# Patient Record
Sex: Female | Born: 1965 | Race: White | Hispanic: No | Marital: Single | Smoking: Current every day smoker
Health system: Southern US, Community
[De-identification: ages and names within clinical notes are randomized; demographics above are authoritative.]

## PROBLEM LIST (undated history)

## (undated) DIAGNOSIS — Z87898 Personal history of other specified conditions: Secondary | ICD-10-CM

## (undated) DIAGNOSIS — K759 Inflammatory liver disease, unspecified: Secondary | ICD-10-CM

## (undated) DIAGNOSIS — G43909 Migraine, unspecified, not intractable, without status migrainosus: Secondary | ICD-10-CM

## (undated) DIAGNOSIS — F418 Other specified anxiety disorders: Secondary | ICD-10-CM

## (undated) DIAGNOSIS — Z8719 Personal history of other diseases of the digestive system: Secondary | ICD-10-CM

## (undated) DIAGNOSIS — F319 Bipolar disorder, unspecified: Secondary | ICD-10-CM

## (undated) DIAGNOSIS — K5909 Other constipation: Secondary | ICD-10-CM

## (undated) DIAGNOSIS — F329 Major depressive disorder, single episode, unspecified: Secondary | ICD-10-CM

## (undated) DIAGNOSIS — F1491 Cocaine use, unspecified, in remission: Secondary | ICD-10-CM

## (undated) DIAGNOSIS — E559 Vitamin D deficiency, unspecified: Secondary | ICD-10-CM

## (undated) DIAGNOSIS — F419 Anxiety disorder, unspecified: Secondary | ICD-10-CM

## (undated) DIAGNOSIS — M1990 Unspecified osteoarthritis, unspecified site: Secondary | ICD-10-CM

## (undated) DIAGNOSIS — F32A Depression, unspecified: Secondary | ICD-10-CM

## (undated) DIAGNOSIS — E039 Hypothyroidism, unspecified: Secondary | ICD-10-CM

## (undated) DIAGNOSIS — Z8701 Personal history of pneumonia (recurrent): Secondary | ICD-10-CM

## (undated) DIAGNOSIS — F17200 Nicotine dependence, unspecified, uncomplicated: Secondary | ICD-10-CM

## (undated) DIAGNOSIS — F5104 Psychophysiologic insomnia: Secondary | ICD-10-CM

## (undated) DIAGNOSIS — K219 Gastro-esophageal reflux disease without esophagitis: Secondary | ICD-10-CM

## (undated) DIAGNOSIS — I10 Essential (primary) hypertension: Secondary | ICD-10-CM

## (undated) DIAGNOSIS — J45909 Unspecified asthma, uncomplicated: Secondary | ICD-10-CM

## (undated) DIAGNOSIS — J449 Chronic obstructive pulmonary disease, unspecified: Secondary | ICD-10-CM

## (undated) DIAGNOSIS — E042 Nontoxic multinodular goiter: Secondary | ICD-10-CM

## (undated) DIAGNOSIS — J309 Allergic rhinitis, unspecified: Secondary | ICD-10-CM

## (undated) DIAGNOSIS — Z8619 Personal history of other infectious and parasitic diseases: Secondary | ICD-10-CM

## (undated) HISTORY — DX: Personal history of other infectious and parasitic diseases: Z86.19

## (undated) HISTORY — DX: Allergic rhinitis, unspecified: J30.9

## (undated) HISTORY — DX: Hypothyroidism, unspecified: E03.9

## (undated) HISTORY — DX: Personal history of other specified conditions: Z87.898

## (undated) HISTORY — DX: Cocaine use, unspecified, in remission: F14.91

## (undated) HISTORY — DX: Chronic obstructive pulmonary disease, unspecified: J44.9

## (undated) HISTORY — DX: Nicotine dependence, unspecified, uncomplicated: F17.200

## (undated) HISTORY — DX: Vitamin D deficiency, unspecified: E55.9

## (undated) HISTORY — DX: Other specified anxiety disorders: F41.8

## (undated) HISTORY — PX: NECK SURGERY: SHX720

## (undated) HISTORY — DX: Migraine, unspecified, not intractable, without status migrainosus: G43.909

## (undated) HISTORY — DX: Nontoxic multinodular goiter: E04.2

## (undated) HISTORY — DX: Other constipation: K59.09

## (undated) HISTORY — DX: Personal history of pneumonia (recurrent): Z87.01

## (undated) HISTORY — DX: Psychophysiologic insomnia: F51.04

---
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
## 2001-07-07 HISTORY — PX: ECTOPIC PREGNANCY SURGERY: SHX613

## 2003-05-10 ENCOUNTER — Other Ambulatory Visit: Payer: Self-pay

## 2004-10-21 ENCOUNTER — Emergency Department: Payer: Self-pay | Admitting: Emergency Medicine

## 2004-10-21 ENCOUNTER — Other Ambulatory Visit: Payer: Self-pay

## 2005-02-13 ENCOUNTER — Emergency Department: Payer: Self-pay | Admitting: Emergency Medicine

## 2005-06-06 ENCOUNTER — Emergency Department: Payer: Self-pay | Admitting: Internal Medicine

## 2005-10-14 ENCOUNTER — Ambulatory Visit: Payer: Self-pay

## 2006-02-13 ENCOUNTER — Emergency Department: Payer: Self-pay | Admitting: Emergency Medicine

## 2006-03-02 ENCOUNTER — Emergency Department: Payer: Self-pay | Admitting: Unknown Physician Specialty

## 2006-04-12 ENCOUNTER — Emergency Department: Payer: Self-pay | Admitting: Emergency Medicine

## 2009-04-14 ENCOUNTER — Emergency Department: Payer: Self-pay | Admitting: Emergency Medicine

## 2014-02-18 ENCOUNTER — Emergency Department: Payer: Self-pay | Admitting: Emergency Medicine

## 2014-02-19 ENCOUNTER — Emergency Department: Payer: Self-pay | Admitting: Emergency Medicine

## 2014-02-20 LAB — BASIC METABOLIC PANEL
Anion Gap: 8 (ref 7–16)
BUN: 8 mg/dL (ref 7–18)
CO2: 30 mmol/L (ref 21–32)
CREATININE: 0.99 mg/dL (ref 0.60–1.30)
Calcium, Total: 8.7 mg/dL (ref 8.5–10.1)
Chloride: 103 mmol/L (ref 98–107)
EGFR (Non-African Amer.): >60
Glucose: 82 mg/dL (ref 65–99)
Osmolality: 279 (ref 275–301)
POTASSIUM: 4 mmol/L (ref 3.5–5.1)
Sodium: 141 mmol/L (ref 136–145)

## 2014-02-20 LAB — CBC
HCT: 34.6 % — ABNORMAL LOW (ref 35.0–47.0)
HGB: 11.8 g/dL — ABNORMAL LOW (ref 12.0–16.0)
MCH: 31.1 pg (ref 26.0–34.0)
MCHC: 34 g/dL (ref 32.0–36.0)
MCV: 91 fL (ref 80–100)
Platelet: 246 10*3/uL (ref 150–440)
RBC: 3.79 10*6/uL — AB (ref 3.80–5.20)
RDW: 14.3 % (ref 11.5–14.5)
WBC: 8.2 10*3/uL (ref 3.6–11.0)

## 2014-03-06 DIAGNOSIS — G479 Sleep disorder, unspecified: Secondary | ICD-10-CM | POA: Insufficient documentation

## 2014-03-06 DIAGNOSIS — R2689 Other abnormalities of gait and mobility: Secondary | ICD-10-CM | POA: Insufficient documentation

## 2014-03-06 DIAGNOSIS — R519 Headache, unspecified: Secondary | ICD-10-CM | POA: Insufficient documentation

## 2014-03-06 DIAGNOSIS — R51 Headache: Secondary | ICD-10-CM

## 2014-03-06 DIAGNOSIS — G4701 Insomnia due to medical condition: Secondary | ICD-10-CM | POA: Insufficient documentation

## 2014-09-27 ENCOUNTER — Emergency Department: Payer: Self-pay | Admitting: Student

## 2014-09-27 LAB — CBC
HCT: 36.9 %
HGB: 12.1 g/dL
MCH: 30.3 pg
MCHC: 32.7 g/dL
MCV: 93 fL
Platelet: 300 x10 3/mm 3
RBC: 3.99 X10 6/mm 3
RDW: 13.8 %
WBC: 10.3 x10 3/mm 3

## 2014-10-05 ENCOUNTER — Ambulatory Visit
Admit: 2014-10-05 | Disposition: A | Discharge status: Home / Self Care | Payer: Self-pay | Attending: Gastroenterology | Admitting: Gastroenterology

## 2014-12-14 ENCOUNTER — Telehealth: Payer: Self-pay | Admitting: Gastroenterology

## 2014-12-14 NOTE — Telephone Encounter (Signed)
Pt is still having reactions with her Hep C

## 2014-12-14 NOTE — Telephone Encounter (Signed)
Pt still having reactions to the hep C medication and vomiting Please call pt

## 2014-12-21 NOTE — Telephone Encounter (Signed)
Tried contacting pt on 2 numbers that are provided. One has been disconnected. The other I LVM for her to return my call.

## 2014-12-25 NOTE — Telephone Encounter (Signed)
Pt returned my call. Stated she is no longer feeling nauseated. Advised her this was most likely a side effect. Asked her to call back if she continues to feel sick.

## 2014-12-28 ENCOUNTER — Ambulatory Visit (INDEPENDENT_AMBULATORY_CARE_PROVIDER_SITE_OTHER): Payer: Medicare Other | Admitting: Family Medicine

## 2014-12-28 ENCOUNTER — Encounter: Payer: Self-pay | Admitting: Family Medicine

## 2014-12-28 VITALS — BP 108/72 | HR 96 | Temp 98.5°F | Resp 18 | Ht 69.0 in | Wt 173.3 lb

## 2014-12-28 DIAGNOSIS — J0100 Acute maxillary sinusitis, unspecified: Secondary | ICD-10-CM

## 2014-12-28 DIAGNOSIS — K219 Gastro-esophageal reflux disease without esophagitis: Secondary | ICD-10-CM

## 2014-12-28 DIAGNOSIS — J9801 Acute bronchospasm: Secondary | ICD-10-CM

## 2014-12-28 MED ORDER — BENZONATATE 200 MG PO CAPS: 200.0000 mg | ORAL_CAPSULE | Freq: Three times a day (TID) | ORAL | Status: DC | PRN

## 2014-12-28 MED ORDER — AMOXICILLIN-POT CLAVULANATE 875-125 MG PO TABS: 1.0000 | ORAL_TABLET | Freq: Two times a day (BID) | ORAL | Status: DC

## 2014-12-28 MED ORDER — OMEPRAZOLE 20 MG PO CPDR: 20.0000 mg | DELAYED_RELEASE_CAPSULE | Freq: Every day | ORAL | Status: DC

## 2014-12-28 MED ORDER — HYDROCOD POLST-CPM POLST ER 10-8 MG/5ML PO SUER: 5.0000 mL | Freq: Two times a day (BID) | ORAL | Status: DC | PRN

## 2014-12-28 NOTE — Progress Notes (Signed)
Name: Sabrina Espinoza   MRN: [id number]    DOB: 11-Jul-1965   Date:12/28/2014       Progress Note  Subjective  Chief Complaint  Chief Complaint  Patient presents with  . URI    patient stated she has been bother for over a week  . Heartburn    patient states that she has been using a lot of Tums    HPI  URI: Patient is here today with concerns regarding the following symptoms sore throat, congestion, sneezing, sinus pressure, productive cough and achiness that started almost 2 weeks ago.  Associated with fatigue and malaise. Has tried the following home remedies: Flonase nasal spray, Equate nasal saline spray, otc night-time medication.   GERD: Paitent complains of heartburn. This has been associated with cough, heartburn and need to clear throat frequently.  She denies belching, chest pain, dysphagia, fullness after meals, midespigastric pain and wheezing. Symptoms have been present for several weeks. She denies dysphagia.  She has not lost weight. She denies melena, hematochezia, hematemesis, and coffee ground emesis. Medical therapy in the past has included antacids.     Past Medical History  Diagnosis Date  . Chronic hepatitis C with hepatic coma     Followed by Dr. Servando Snare. Starting [facility] treatment for 12 weeks in 2016  . Hypothyroidism, adult     Working with Dr. Horton Chin, Endocrinology. S/P RAIU and is planning on biopsy  . Nontoxic multinodular goiter     FNA done 10/30/14 with benign results. Seen [name]. Patient may want a second opinion.  . Vitamin D deficiency   . Nicotine dependence, uncomplicated   . Depression with anxiety     Followed by Dr. Lynett Fish, psychiatrist at [facility] and [facility] Flavin for thrapy  . Migraine without status migrainosus, not intractable   . Rhinitis, allergic   . COPD, moderate   . Chronic constipation     on Linzess, Miralax, Zofran  . History of chicken pox   . History of cocaine use     History  Substance  Use Topics  . Smoking status: Current Every Day Smoker    Types: Cigarettes  . Smokeless tobacco: Not on file  . Alcohol Use: No     Current outpatient prescriptions:  .  budesonide-formoterol (SYMBICORT) 160-4.5 MCG/ACT inhaler, , Disp: , Rfl:  .  butalbital-acetaminophen-caffeine (FIORICET, ESGIC) 50-325-40 MG per tablet, , Disp: , Rfl:  .  Calcium-Vitamin D 600-200 MG-UNIT per tablet, Take by mouth., Disp: , Rfl:  .  clonazePAM (KLONOPIN) 1 MG tablet, Take by mouth., Disp: , Rfl:  .  gabapentin (NEURONTIN) 300 MG capsule, Take by mouth., Disp: , Rfl:   No Known Allergies  ROS  10 Systems reviewed and is negative except as mentioned in HPI.   Objective  Filed Vitals:   12/28/14 1449  BP: 108/72  Pulse: 96  Temp: 98.5 F (36.9 C)  TempSrc: Oral  Resp: 18  Height:  (1.753 m)  Weight: 173 lb 4.8 oz (78.608 kg)  SpO2: 97%     Physical Exam  Constitutional: Patient appears well-developed and well-nourished. In no acute distress but does appear to be fatigued from acute illness. HEENT:  - Head: Normocephalic and atraumatic.  - Ears: RIGHT TM bulging with minimal clear exudate, LEFT TM bulging with minimal clear exudate.  - Nose: Nasal mucosa boggy and congested. Maxillary sinus tenderness bilaterally.  - Mouth/Throat: Oropharynx is moist with slight erythema of bilateral tonsils without hypertrophy or  exudates. Post nasal drainage present.  - Eyes: Conjunctivae clear, EOM movements normal. PERRLA. No scleral icterus.  Neck: Normal range of motion. Neck supple. No JVD present. No thyromegaly present. No local lymphadenopathy. Cardiovascular: Regular rate, regular rhythm with no murmurs heard.  Pulmonary/Chest: Effort normal and breath sounds clear in all lung fields.  Abdomen: Soft, NT/ND, normal bowel sounds. Musculoskeletal: Normal range of motion bilateral UE and LE, no joint effusions. Skin: Skin is warm and dry. No rash noted. Psychiatric: Patient has a normal  mood and affect. Behavior is normal in office today. Judgment and thought content normal in office today.   Assessment & Plan 1. Acute maxillary sinusitis, recurrence not specified Etiologies include allergic rhinitis, viral or bacterial infection. Instructed patient on increasing hydration, nasal saline spray, steam inhalation, NSAID if tolerated and not contraindicated. If not already doing so start taking daily anti-histamine and use a steroid nasal spray. If symptoms persist/worsen may consider antibiotic therapy.  - amoxicillin-clavulanate (AUGMENTIN) 875-125 MG per tablet; Take 1 tablet by mouth 2 (two) times daily.  Dispense: 20 tablet; Refill: 0  2. Cough due to bronchospasm May also try OTC Delsym if RX medication not covered by insurance.   - benzonatate (TESSALON) 200 MG capsule; Take 1 capsule (200 mg total) by mouth 3 (three) times daily as needed for cough.  Dispense: 30 capsule; Refill: 0 - chlorpheniramine-HYDROcodone (TUSSIONEX PENNKINETIC ER) 10-8 MG/5ML SUER; Take 5 mLs by mouth every 12 (twelve) hours as needed for cough.  Dispense: 100 mL; Refill: 0  3. Gastroesophageal reflux disease without esophagitis Avoid excessive use of PPI as recent literature shows long term use can contribute to adverse outcomes.  - omeprazole (PRILOSEC) 20 MG capsule; Take 1 capsule (20 mg total) by mouth daily. For 14 days then wean off.  Dispense: 30 capsule; Refill: 2

## 2014-12-28 NOTE — Patient Instructions (Signed)
Upper Respiratory Infection, Adult An upper respiratory infection (URI) is also sometimes known as the common cold. The upper respiratory tract includes the nose, sinuses, throat, trachea, and bronchi. Bronchi are the airways leading to the lungs. Most people improve within 1 week, but symptoms can last up to 2 weeks. A residual cough may last even longer.  CAUSES Many different viruses can infect the tissues lining the upper respiratory tract. The tissues become irritated and inflamed and often become very moist. Mucus production is also common. A cold is contagious. You can easily spread the virus to others by oral contact. This includes kissing, sharing a glass, coughing, or sneezing. Touching your mouth or nose and then touching a surface, which is then touched by another person, can also spread the virus. SYMPTOMS  Symptoms typically develop 1 to 3 days after you come in contact with a cold virus. Symptoms vary from person to person. They may include:  Runny nose.  Sneezing.  Nasal congestion.  Sinus irritation.  Sore throat.  Loss of voice (laryngitis).  Cough.  Fatigue.  Muscle aches.  Loss of appetite.  Headache.  Low-grade fever. DIAGNOSIS  You might diagnose your own cold based on familiar symptoms, since most people get a cold 2 to 3 times a year. Your caregiver can confirm this based on your exam. Most importantly, your caregiver can check that your symptoms are not due to another disease such as strep throat, sinusitis, pneumonia, asthma, or epiglottitis. Blood tests, throat tests, and X-rays are not necessary to diagnose a common cold, but they may sometimes be helpful in excluding other more serious diseases. Your caregiver will decide if any further tests are required. RISKS AND COMPLICATIONS  You may be at risk for a more severe case of the common cold if you smoke cigarettes, have chronic heart disease (such as heart failure) or lung disease (such as asthma), or if  you have a weakened immune system. The very young and very old are also at risk for more serious infections. Bacterial sinusitis, middle ear infections, and bacterial pneumonia can complicate the common cold. The common cold can worsen asthma and chronic obstructive pulmonary disease (COPD). Sometimes, these complications can require emergency medical care and may be life-threatening. PREVENTION  The best way to protect against getting a cold is to practice good hygiene. Avoid oral or hand contact with people with cold symptoms. Wash your hands often if contact occurs. There is no clear evidence that vitamin C, vitamin E, echinacea, or exercise reduces the chance of developing a cold. However, it is always recommended to get plenty of rest and practice good nutrition. TREATMENT  Treatment is directed at relieving symptoms. There is no cure. Antibiotics are not effective, because the infection is caused by a virus, not by bacteria. Treatment may include:  Increased fluid intake. Sports drinks offer valuable electrolytes, sugars, and fluids.  Breathing heated mist or steam (vaporizer or shower).  Eating chicken soup or other clear broths, and maintaining good nutrition.  Getting plenty of rest.  Using gargles or lozenges for comfort.  Controlling fevers with ibuprofen or acetaminophen as directed by your caregiver.  Increasing usage of your inhaler if you have asthma. Zinc gel and zinc lozenges, taken in the first 24 hours of the common cold, can shorten the duration and lessen the severity of symptoms. Pain medicines may help with fever, muscle aches, and throat pain. A variety of non-prescription medicines are available to treat congestion and runny nose. Your caregiver   can make recommendations and may suggest nasal or lung inhalers for other symptoms.  HOME CARE INSTRUCTIONS   Only take over-the-counter or prescription medicines for pain, discomfort, or fever as directed by your  caregiver.  Use a warm mist humidifier or inhale steam from a shower to increase air moisture. This may keep secretions moist and make it easier to breathe.  Drink enough water and fluids to keep your urine clear or pale yellow.  Rest as needed.  Return to work when your temperature has returned to normal or as your caregiver advises. You may need to stay home longer to avoid infecting others. You can also use a face mask and careful hand washing to prevent spread of the virus. SEEK MEDICAL CARE IF:   After the first few days, you feel you are getting worse rather than better.  You need your caregiver's advice about medicines to control symptoms.  You develop chills, worsening shortness of breath, or brown or red sputum. These may be signs of pneumonia.  You develop yellow or brown nasal discharge or pain in the face, especially when you bend forward. These may be signs of sinusitis.  You develop a fever, swollen neck glands, pain with swallowing, or white areas in the back of your throat. These may be signs of strep throat. SEEK IMMEDIATE MEDICAL CARE IF:   You have a fever.  You develop severe or persistent headache, ear pain, sinus pain, or chest pain.  You develop wheezing, a prolonged cough, cough up blood, or have a change in your usual mucus (if you have chronic lung disease).  You develop sore muscles or a stiff neck. Document Released: 12/17/2000 Document Revised: 09/15/2011 Document Reviewed: 09/28/2013 Exit[employer] Patient Information 2015 [employer]. This information is not intended to replace advice given to you by your health care provider. Make sure you discuss any questions you have with your health care provider.  

## 2015-01-10 ENCOUNTER — Telehealth: Payer: Self-pay | Admitting: Family Medicine

## 2015-01-10 NOTE — Telephone Encounter (Signed)
Patient must come in for an appt.

## 2015-01-10 NOTE — Telephone Encounter (Signed)
Pt was prescribed some antibotics and have completed them. She is no better and is [facility] [phone]

## 2015-01-10 NOTE — Telephone Encounter (Signed)
Patient scheduled appointment for Tuesday July 12

## 2015-01-16 ENCOUNTER — Ambulatory Visit (INDEPENDENT_AMBULATORY_CARE_PROVIDER_SITE_OTHER): Payer: Medicare Other | Admitting: Family Medicine

## 2015-01-16 ENCOUNTER — Encounter: Payer: Self-pay | Admitting: Family Medicine

## 2015-01-16 ENCOUNTER — Other Ambulatory Visit: Payer: Self-pay | Admitting: Family Medicine

## 2015-01-16 VITALS — BP 116/78 | HR 94 | Temp 98.0°F | Resp 18 | Ht 69.0 in | Wt 172.8 lb

## 2015-01-16 DIAGNOSIS — J449 Chronic obstructive pulmonary disease, unspecified: Secondary | ICD-10-CM

## 2015-01-16 DIAGNOSIS — B182 Chronic viral hepatitis C: Secondary | ICD-10-CM

## 2015-01-16 DIAGNOSIS — F418 Other specified anxiety disorders: Secondary | ICD-10-CM

## 2015-01-16 DIAGNOSIS — J40 Bronchitis, not specified as acute or chronic: Secondary | ICD-10-CM | POA: Diagnosis not present

## 2015-01-16 DIAGNOSIS — F3177 Bipolar disorder, in partial remission, most recent episode mixed: Secondary | ICD-10-CM

## 2015-01-16 DIAGNOSIS — K5909 Other constipation: Secondary | ICD-10-CM

## 2015-01-16 DIAGNOSIS — Z87898 Personal history of other specified conditions: Secondary | ICD-10-CM | POA: Insufficient documentation

## 2015-01-16 DIAGNOSIS — F1491 Cocaine use, unspecified, in remission: Secondary | ICD-10-CM | POA: Insufficient documentation

## 2015-01-16 DIAGNOSIS — E042 Nontoxic multinodular goiter: Secondary | ICD-10-CM

## 2015-01-16 DIAGNOSIS — G43009 Migraine without aura, not intractable, without status migrainosus: Secondary | ICD-10-CM

## 2015-01-16 DIAGNOSIS — J209 Acute bronchitis, unspecified: Secondary | ICD-10-CM

## 2015-01-16 DIAGNOSIS — F17210 Nicotine dependence, cigarettes, uncomplicated: Secondary | ICD-10-CM | POA: Insufficient documentation

## 2015-01-16 MED ORDER — PREDNISONE 10 MG (21) PO TBPK: 10.0000 mg | ORAL_TABLET | Freq: Every day | ORAL | Status: DC

## 2015-01-16 MED ORDER — LEVOFLOXACIN 750 MG PO TABS: 750.0000 mg | ORAL_TABLET | Freq: Every day | ORAL | Status: DC

## 2015-01-16 NOTE — Progress Notes (Signed)
Name: Sabrina Espinoza   MRN: [id number]    DOB: February 15, 1966   Date:01/16/2015       Progress Note  Subjective  Chief Complaint  Chief Complaint  Patient presents with  . Follow-up    patient states that she has not gotten any better.    HPI  Patient is here today with concerns regarding the following symptoms congestion, post nasal drip, ear pressure, sinus pressure, productive cough, achiness and low grade fevers that started weeks ago.  Associated with fatigue. Not associated with nausea, vomiting, rash, worsening headaches, change in bowel habits, dysuria. No other sick contacts or recent travel. Has tried the following remedies: Augmentin, tessalon perles, cough syrup. Initially symptoms settles down but they came right back and instead of her sinuses everything has really settled into her chest.   Past Medical History  Diagnosis Date  . Chronic hepatitis C with hepatic coma     Followed by Dr. Servando SnareWohl. [facility]  . Hypothyroidism, adult     Working with Dr. Horton ChinShamil [name], Endocrinology. S/P RAIU and is planning on biopsy  . Nontoxic multinodular goiter     FNA done 10/30/14 with benign results. Seen [name]. Patient may want a second opinion.  . Vitamin D deficiency   . Nicotine dependence, uncomplicated   . Depression with anxiety     [facility] [name][facility] and [name] for [facility]  . Migraine without status migrainosus, not intractable   . Rhinitis, allergic   . COPD, moderate   . Chronic constipation     on Linzess, Miralax, Zofran  . History of chicken pox   . History of cocaine use     History  Substance Use Topics  . Smoking status: Current Every Day Smoker    Types: Cigarettes  . Smokeless tobacco: Not on file  . Alcohol Use: No     Current outpatient prescriptions:  .  budesonide-formoterol (SYMBICORT) 160-4.5 MCG/ACT inhaler, , Disp: , Rfl:  .   butalbital-acetaminophen-caffeine (FIORICET, ESGIC) 50-325-40 MG per tablet, , Disp: , Rfl:  .  Calcium-Vitamin D 600-200 MG-UNIT per tablet, Take by mouth., Disp: , Rfl:  .  clonazePAM (KLONOPIN) 1 MG tablet, Take by mouth., Disp: , Rfl:  .  Eszopiclone (ESZOPICLONE) 3 MG TABS, Take 3 mg by mouth at bedtime. Take immediately before bedtime, Disp: , Rfl:  .  fluticasone (FLONASE) 50 MCG/ACT nasal spray, Place 1 spray into both nostrils 2 (two) times daily., Disp: , Rfl: 5 .  gabapentin (NEURONTIN) 300 MG capsule, Take by mouth., Disp: , Rfl:  .  HARVONI 90-400 MG TABS, Take 1 tablet by mouth daily., Disp: , Rfl:  .  hydrOXYzine (VISTARIL) 50 MG capsule, Take 1 capsule by mouth 2 (two) times daily as needed., Disp: , Rfl:  .  lamoTRIgine (LAMICTAL) 200 MG tablet, Take 1 tablet by mouth daily., Disp: , Rfl:  .  Linaclotide (LINZESS) 290 MCG CAPS capsule, Take 290 mcg by mouth daily., Disp: , Rfl:  .  meclizine (ANTIVERT) 25 MG tablet, Take 25 mg by mouth 3 (three) times daily as needed for dizziness., Disp: , Rfl:  .  montelukast (SINGULAIR) 10 MG tablet, Take 10 mg by mouth at bedtime., Disp: , Rfl:  .  omeprazole (PRILOSEC) 20 MG capsule, Take 1 capsule (20 mg total) by mouth daily. For 14 days then wean off., Disp: 30 capsule, Rfl: 2 .  ondansetron (ZOFRAN) 4 MG  tablet, Take 4 mg by mouth every 8 (eight) hours as needed for nausea or vomiting., Disp: , Rfl:  .  Polyethylene Glycol 3350 (MIRALAX PO), Take by mouth., Disp: , Rfl:  .  prazosin (MINIPRESS) 2 MG capsule, Take 1 capsule by mouth daily., Disp: , Rfl:  .  rOPINIRole (REQUIP) 2 MG tablet, Take 2 mg by mouth 2 (two) times daily., Disp: , Rfl: 4 .  SUMAtriptan (IMITREX) 100 MG tablet, Take 100 mg by mouth every 2 (two) hours as needed for migraine. May repeat in 2 hours if headache persists or recurs., Disp: , Rfl:  .  SUMAtriptan (IMITREX) 50 MG tablet, Take 50 mg by mouth every 2 (two) hours as needed for migraine. May repeat in 2 hours if  headache persists or recurs., Disp: , Rfl:  .  traZODone (DESYREL) 150 MG tablet, Take by mouth at bedtime., Disp: , Rfl:   No Known Allergies  ROS  10 Systems reviewed and is negative except as mentioned in HPI.   Objective  Filed Vitals:   01/16/15 1045  BP: 116/78  Pulse: 94  Temp: 98 F (36.7 C)  TempSrc: Oral  Resp: 18  Height:  (1.753 m)  Weight: 172 lb 12.8 oz (78.382 kg)  SpO2: 97%   Body mass index is 25.51 kg/(m^2).   Physical Exam  Constitutional: Patient appears well-developed and well-nourished. In no acute distress but does appear to be fatigued from acute illness. HEENT:  - Head: Normocephalic and atraumatic.  - Ears: RIGHT TM bulging with minimal clear exudate, LEFT TM bulging with minimal clear exudate.  - Nose: Nasal mucosa boggy and congested.  - Mouth/Throat: Oropharynx is moist with slight erythema of bilateral tonsils without hypertrophy or exudates. Post nasal drainage present.  - Eyes: Conjunctivae clear, EOM movements normal. PERRLA. No scleral icterus.  Neck: Normal range of motion. Neck supple. No JVD present. No thyromegaly present. No local lymphadenopathy. Cardiovascular: Regular rate, regular rhythm with no murmurs heard.  Pulmonary/Chest: Decreased breath sounds in all lung fields with some bronchial sounds in bilateral anterior upper airways. Musculoskeletal: Normal range of motion bilateral UE and LE, no joint effusions. Skin: Skin is warm and dry. No rash noted. Psychiatric: Patient has a normal mood and affect. Behavior is normal in office today. Judgment and thought content normal in office today.   Assessment & Plan  1. Bronchitis with bronchospasm Start Levaquin and prednisone taper pack. If no improvement will get CXR.   - levofloxacin (LEVAQUIN) 750 MG tablet; Take 1 tablet (750 mg total) by mouth daily.  Dispense: 14 tablet; Refill: 0 - predniSONE (STERAPRED UNI-PAK 21 TAB) 10 MG (21) TBPK tablet; Take 1 tablet (10 mg  total) by mouth daily. Use as directed in a 6 day taper predpak.  Dispense: 21 tablet; Refill: 1  2. COPD, moderate Continue breathing treatments and inhalers at home.  - predniSONE (STERAPRED UNI-PAK 21 TAB) 10 MG (21) TBPK tablet; Take 1 tablet (10 mg total) by mouth daily. Use as directed in a 6 day taper predpak.  Dispense: 21 tablet; Refill: 1

## 2015-01-18 ENCOUNTER — Other Ambulatory Visit: Payer: Self-pay | Admitting: Family Medicine

## 2015-01-18 DIAGNOSIS — R110 Nausea: Secondary | ICD-10-CM

## 2015-01-25 ENCOUNTER — Other Ambulatory Visit: Payer: Self-pay | Admitting: Family Medicine

## 2015-01-25 DIAGNOSIS — J309 Allergic rhinitis, unspecified: Secondary | ICD-10-CM

## 2015-01-25 MED ORDER — FLUTICASONE PROPIONATE 50 MCG/ACT NA SUSP: 1.0000 | Freq: Two times a day (BID) | NASAL | Status: DC

## 2015-01-25 NOTE — Telephone Encounter (Signed)
Pt needs refill on Flonase to be sent to [facility].

## 2015-01-25 NOTE — Telephone Encounter (Signed)
Refill request was sent to Dr. Ashany Sundaram for approval and submission.  

## 2015-01-31 ENCOUNTER — Ambulatory Visit
Admission: RE | Admit: 2015-01-31 | Discharge: 2015-01-31 | Disposition: A | Discharge status: Home / Self Care | Payer: Medicare Other | Source: Ambulatory Visit | Attending: Family Medicine | Admitting: Family Medicine

## 2015-01-31 ENCOUNTER — Encounter: Payer: Self-pay | Admitting: Family Medicine

## 2015-01-31 ENCOUNTER — Ambulatory Visit (INDEPENDENT_AMBULATORY_CARE_PROVIDER_SITE_OTHER): Payer: Medicare Other | Admitting: Family Medicine

## 2015-01-31 VITALS — BP 116/72 | HR 87 | Temp 98.1°F | Resp 18 | Wt 173.2 lb

## 2015-01-31 DIAGNOSIS — J40 Bronchitis, not specified as acute or chronic: Secondary | ICD-10-CM

## 2015-01-31 DIAGNOSIS — J329 Chronic sinusitis, unspecified: Secondary | ICD-10-CM | POA: Diagnosis not present

## 2015-01-31 DIAGNOSIS — R05 Cough: Secondary | ICD-10-CM

## 2015-01-31 DIAGNOSIS — J45909 Unspecified asthma, uncomplicated: Secondary | ICD-10-CM

## 2015-01-31 DIAGNOSIS — B370 Candidal stomatitis: Secondary | ICD-10-CM | POA: Diagnosis not present

## 2015-01-31 DIAGNOSIS — J309 Allergic rhinitis, unspecified: Secondary | ICD-10-CM | POA: Insufficient documentation

## 2015-01-31 DIAGNOSIS — R0982 Postnasal drip: Secondary | ICD-10-CM | POA: Diagnosis not present

## 2015-01-31 DIAGNOSIS — R059 Cough, unspecified: Secondary | ICD-10-CM

## 2015-01-31 MED ORDER — LEVOCETIRIZINE DIHYDROCHLORIDE 5 MG PO TABS: 5.0000 mg | ORAL_TABLET | Freq: Every evening | ORAL | Status: DC

## 2015-01-31 MED ORDER — NYSTATIN 100000 UNIT/ML MT SUSP: 5.0000 mL | Freq: Four times a day (QID) | OROMUCOSAL | Status: DC

## 2015-01-31 NOTE — Progress Notes (Signed)
Name: Sabrina Espinoza   MRN: [id number]    DOB: January 24, 1966   Date:01/31/2015       Progress Note  Subjective  Chief Complaint  Chief Complaint  Patient presents with  . Nasal Congestion    patient states that she has some chest tightness and cough.    HPI  Patient is here today with concerns regarding the following symptoms congestion, post nasal drip, ear pressure, sinus pressure, productive cough and achiness that started a couple of months ago.  Associated with fatigue and malaise. Patient has tried 2 rounds of antibiotics and a steroid dose pack without getting any relief. Patient reports that she has had some back pain and some tightness in her chest. She stated that she has had a history of pneumonia. She has also had allergy skin testing which showed significant environmental allergies and was told she may benefit from allergy shots.   Past Medical History  Diagnosis Date  . Chronic hepatitis C with hepatic coma     Followed by Dr. Servando Snare. Starting [facility] treatment for 12 weeks in 2016  . Hypothyroidism, adult     Working with Dr. Horton Chin, Endocrinology. S/P RAIU and is planning on biopsy  . Nontoxic multinodular goiter     FNA done 10/30/14 with benign results. Seen [name]. Patient may want a second opinion.  . Vitamin D deficiency   . Nicotine dependence, uncomplicated   . Depression with anxiety     Followed by Dr. Lynett Fish, psychiatrist at [facility] and Wilma Flavin for thrapy  . Migraine without status migrainosus, not intractable   . Rhinitis, allergic   . COPD, moderate   . Chronic constipation     on Linzess, Miralax, Zofran  . History of chicken pox   . History of cocaine use   . History of pneumonia     History  Substance Use Topics  . Smoking status: Current Every Day Smoker    Types: Cigarettes  . Smokeless tobacco: Not on file  . Alcohol Use: No     Current outpatient prescriptions:  .  budesonide-formoterol (SYMBICORT)  160-4.5 MCG/ACT inhaler, , Disp: , Rfl:  .  butalbital-acetaminophen-caffeine (FIORICET, ESGIC) 50-325-40 MG per tablet, TAKE 1 TABLET EVERY 4 HOURS AS NEEDED, Disp: 45 tablet, Rfl: 5 .  Calcium-Vitamin D 600-200 MG-UNIT per tablet, Take by mouth., Disp: , Rfl:  .  clonazePAM (KLONOPIN) 1 MG tablet, Take by mouth., Disp: , Rfl:  .  Eszopiclone (ESZOPICLONE) 3 MG TABS, Take 3 mg by mouth at bedtime. Take immediately before bedtime, Disp: , Rfl:  .  fluticasone (FLONASE) 50 MCG/ACT nasal spray, Place 1 spray into both nostrils 2 (two) times daily., Disp: 16 g, Rfl: 5 .  gabapentin (NEURONTIN) 300 MG capsule, Take by mouth., Disp: , Rfl:  .  HARVONI 90-400 MG TABS, Take 1 tablet by mouth daily., Disp: , Rfl:  .  hydrOXYzine (ATARAX/VISTARIL) 50 MG tablet, TAKE 1 TABLET BY MOUTH EACH NIGHT AT BEDTIME, Disp: 30 tablet, Rfl: 2 .  hydrOXYzine (VISTARIL) 50 MG capsule, Take 1 capsule by mouth 2 (two) times daily as needed., Disp: , Rfl:  .  lamoTRIgine (LAMICTAL) 200 MG tablet, Take 1 tablet by mouth daily., Disp: , Rfl:  .  Linaclotide (LINZESS) 290 MCG CAPS capsule, Take 290 mcg by mouth daily., Disp: , Rfl:  .  meclizine (ANTIVERT) 25 MG tablet, Take 25 mg by mouth 3 (three) times daily as needed for dizziness., Disp: , Rfl:  .  montelukast (SINGULAIR) 10 MG tablet, Take 10 mg by mouth at bedtime., Disp: , Rfl:  .  omeprazole (PRILOSEC) 20 MG capsule, Take 1 capsule (20 mg total) by mouth daily. For 14 days then wean off., Disp: 30 capsule, Rfl: 2 .  ondansetron (ZOFRAN) 4 MG tablet, Take 4 mg by mouth every 8 (eight) hours as needed for nausea or vomiting., Disp: , Rfl:  .  Polyethylene Glycol 3350 (MIRALAX PO), Take by mouth., Disp: , Rfl:  .  polyethylene glycol powder (GLYCOLAX/MIRALAX) powder, USE 1 SCOOP EVERY DAY AS NEEDED, Disp: , Rfl: 5 .  prazosin (MINIPRESS) 2 MG capsule, Take 1 capsule by mouth daily., Disp: , Rfl:  .  promethazine (PHENERGAN) 25 MG tablet, TAKE 1 TABLET BY MOUTH EVERY 6  HOURS AS NEEDED, Disp: 30 tablet, Rfl: 1 .  rOPINIRole (REQUIP) 2 MG tablet, Take 2 mg by mouth 2 (two) times daily., Disp: , Rfl: 4 .  SUMAtriptan (IMITREX) 100 MG tablet, Take 100 mg by mouth every 2 (two) hours as needed for migraine. May repeat in 2 hours if headache persists or recurs., Disp: , Rfl:  .  SUMAtriptan (IMITREX) 50 MG tablet, Take 50 mg by mouth every 2 (two) hours as needed for migraine. May repeat in 2 hours if headache persists or recurs., Disp: , Rfl:  .  traZODone (DESYREL) 150 MG tablet, Take by mouth at bedtime., Disp: , Rfl:  .  triamcinolone (KENALOG) 0.1 % paste, APPLY TO AFFECTED AREA AS NEEDED, Disp: , Rfl: 2  No Known Allergies  ROS  10 Systems reviewed and is negative except as mentioned in HPI.   Objective  Filed Vitals:   01/31/15 0907  BP: 116/72  Pulse: 87  Temp: 98.1 F (36.7 C)  TempSrc: Oral  Resp: 18  Weight: 173 lb 3.2 oz (78.563 kg)  SpO2: 97%   Body mass index is 25.57 kg/(m^2).   Physical Exam  Constitutional: Patient appears well-developed and well-nourished. In no distress.  HEENT:  - Head: Normocephalic and atraumatic.  - Ears: Bilateral TMs gray, no erythema or effusion - Nose: Nasal mucosa moist, boggy and congested - Mouth/Throat: Oropharynx with white patches. No tonsillar hypertrophy or erythema. Yes post nasal drainage.  - Eyes: Conjunctivae clear, EOM movements normal. PERRLA. No scleral icterus.  Neck: Normal range of motion. Neck supple. No JVD present. No thyromegaly present.  Cardiovascular: Normal rate, regular rhythm and normal heart sounds.  No murmur heard.  Pulmonary/Chest: Effort normal and breath sounds normal. No respiratory distress. Musculoskeletal: Normal range of motion bilateral UE and LE, no joint effusions. Peripheral vascular: Bilateral LE no edema. Neurological: CN II-XII grossly intact with no focal deficits. Alert and oriented to person, place, and time. Coordination, balance, strength, speech and  gait are normal.  Skin: Skin is warm and dry. No rash noted. No erythema.  Psychiatric: Patient has a normal mood and affect. Behavior is normal in office today. Judgment and thought content normal in office today.    Assessment & Plan  1. Cough COPD clinically stable, has been treated with Levaquin high dose for 14 days along with Prednisone taper pack. Unlikely infectious source at this time. Will get CXR and refer to allergist as in the past she has had skin testing showing significant environmental allergies and was told she may benefit from allergy shots. Other rare etiologies considered: TB, pulmonary fibrosis or some other underlying lung pathology.   - DG Chest 2 View; Future  2. Allergic rhinitis with  postnasal drip Continue flonase, singulair, add on xyzal.  - levocetirizine (XYZAL) 5 MG tablet; Take 1 tablet (5 mg total) by mouth every evening.  Dispense: 30 tablet; Refill: 2 - Referral Allergist  3. Chronic sinusitis with recurrent bronchitis  - DG Chest 2 View; Future  4. Oral thrush  - nystatin (MYCOSTATIN) 100000 UNIT/ML suspension; Take 5 mLs (500,000 Units total) by mouth 4 (four) times daily.  Dispense: 120 mL; Refill: 0

## 2015-01-31 NOTE — Patient Instructions (Signed)

## 2015-02-06 ENCOUNTER — Telehealth: Payer: Self-pay | Admitting: Gastroenterology

## 2015-02-06 NOTE — Telephone Encounter (Signed)
Today is the last day for her Harvoni. Please advice.

## 2015-02-08 ENCOUNTER — Telehealth: Payer: Self-pay | Admitting: Gastroenterology

## 2015-02-08 NOTE — Telephone Encounter (Signed)
Pt has finished her Hep C treatment and needs follow up. Could you please call and make her an appt with Wohl? Next available will be 03-05-15. Thank you!

## 2015-02-08 NOTE — Telephone Encounter (Signed)
Patient said she was still waiting for you to call her back.

## 2015-02-12 LAB — BASIC METABOLIC PANEL
Anion Gap: 9 (ref 7–16)
BUN: 8 mg/dL
CALCIUM: 9.2 mg/dL
CHLORIDE: 99 mmol/L — AB
Co2: 29 mmol/L
Creatinine: 0.82 mg/dL
EGFR (African American): >60
Glucose: 96 mg/dL
POTASSIUM: 3.7 mmol/L
Sodium: 137 mmol/L

## 2015-02-12 LAB — PRO B NATRIURETIC PEPTIDE: B-TYPE NATIURETIC PEPTID: 9 pg/mL

## 2015-02-12 LAB — TROPONIN I: Troponin-I: <0.03 ng/mL

## 2015-02-19 ENCOUNTER — Other Ambulatory Visit: Payer: Self-pay | Admitting: Family Medicine

## 2015-02-19 DIAGNOSIS — J449 Chronic obstructive pulmonary disease, unspecified: Secondary | ICD-10-CM

## 2015-02-26 ENCOUNTER — Encounter: Payer: Self-pay | Admitting: Family Medicine

## 2015-02-26 ENCOUNTER — Ambulatory Visit (INDEPENDENT_AMBULATORY_CARE_PROVIDER_SITE_OTHER): Payer: Medicare Other | Admitting: Family Medicine

## 2015-02-26 VITALS — BP 116/78 | HR 97 | Temp 98.5°F | Resp 16 | Wt 174.6 lb

## 2015-02-26 DIAGNOSIS — Z13220 Encounter for screening for lipoid disorders: Secondary | ICD-10-CM | POA: Insufficient documentation

## 2015-02-26 DIAGNOSIS — L989 Disorder of the skin and subcutaneous tissue, unspecified: Secondary | ICD-10-CM | POA: Diagnosis not present

## 2015-02-26 DIAGNOSIS — E042 Nontoxic multinodular goiter: Secondary | ICD-10-CM | POA: Diagnosis not present

## 2015-02-26 DIAGNOSIS — E785 Hyperlipidemia, unspecified: Secondary | ICD-10-CM | POA: Diagnosis not present

## 2015-02-26 DIAGNOSIS — Z0000 Encounter for general adult medical examination without abnormal findings: Secondary | ICD-10-CM | POA: Diagnosis not present

## 2015-02-26 DIAGNOSIS — Z2821 Immunization not carried out because of patient refusal: Secondary | ICD-10-CM | POA: Diagnosis not present

## 2015-02-26 DIAGNOSIS — M5030 Other cervical disc degeneration, unspecified cervical region: Secondary | ICD-10-CM | POA: Diagnosis not present

## 2015-02-26 DIAGNOSIS — J449 Chronic obstructive pulmonary disease, unspecified: Secondary | ICD-10-CM

## 2015-02-26 DIAGNOSIS — Z1239 Encounter for other screening for malignant neoplasm of breast: Secondary | ICD-10-CM | POA: Diagnosis not present

## 2015-02-26 DIAGNOSIS — R252 Cramp and spasm: Secondary | ICD-10-CM | POA: Diagnosis not present

## 2015-02-26 NOTE — Progress Notes (Signed)
Name: Sabrina Espinoza   MRN: [id number]    DOB: [birth year]   Date:02/26/2015       Progress Note  Subjective  Chief Complaint  Chief Complaint  Patient presents with  . Annual Exam  . Referral    mammo and dexa  . Hypothyroidism    4 month follow-up    HPI  Patient is here today for a Female Medicare Wellness Visit:  The patient has been in otherwise stable general health and voices concerns regarding bilateral leg cramps mostly at night, neck and shoulder pain for which she has had X-rays showing DJD, skin lesions needs Derm evaluation.  Patient would like to repeat a DEXA scan, she does not have osteoporosis. And she would like a mammogram. Can not due PAP due to currently being on menses.   Joint/Muscle Pain: Patient complains of arthralgias for which has been present for several months. Pain is located in neck and shoulders, is described as aching, and is intermittent .  Associated symptoms include: decreased range of motion.  The patient has tried nothing for pain relief.  Related to injury:   No.    COPD: Patient complains of fatigue. Symptoms began several years ago. Symptoms chronic dyspnea does worsen with exertion. Sputum is white in small amounts. Fever has been not present. Patient uses 2 pillows at night. Patient can walk unlimited feet before resting. Patient currently is not on home oxygen therapy. Respiratory history: frequent episodes of bronchitis, COPD and pneumonia      Past Medical History  Diagnosis Date  . Chronic hepatitis C with hepatic coma     Followed by Dr. Servando Snare. Starting [facility] treatment for 12 weeks in 2016  . Hypothyroidism, adult     Working with Dr. Horton Chin, Endocrinology. S/P RAIU and is planning on biopsy  . Nontoxic multinodular goiter     FNA done 10/30/14 with benign results. Seen [name]. Patient may want a second opinion.  . Vitamin D deficiency   . Nicotine dependence, uncomplicated   . Depression with anxiety     Followed by  Dr. Lynett Fish, psychiatrist at [facility] and Wilma Flavin for thrapy  . Migraine without status migrainosus, not intractable   . Rhinitis, allergic   . COPD, moderate   . Chronic constipation     on Linzess, Miralax, Zofran  . History of chicken pox   . History of cocaine use   . History of pneumonia     Past Surgical History  Procedure Laterality Date  . Ectopic pregnancy surgery  2003  . Ankle fracture surgery  1988    rods and pins in place  . Cesarean section  1996    Family History  Problem Relation Age of Onset  . Heart disease Father   . Alcohol abuse Father   . Diabetes Father   . Heart disease Brother   . Depression Brother   . Stroke Brother   . Alcohol abuse Paternal Grandfather     Social History   Social History  . Marital Status: Married    Spouse Name: N/A  . Number of Children: N/A  . Years of Education: N/A   Occupational History  . Not on file.   Social History Main Topics  . Smoking status: Current Every Day Smoker    Types: Cigarettes  . Smokeless tobacco: Not on file  . Alcohol Use: No  . Drug Use: No  . Sexual Activity: No   Other Topics Concern  .  Not on file   Social History Narrative     Current outpatient prescriptions:  .  butalbital-acetaminophen-caffeine (FIORICET, ESGIC) 50-325-40 MG per tablet, TAKE 1 TABLET EVERY 4 HOURS AS NEEDED, Disp: 45 tablet, Rfl: 5 .  Calcium-Vitamin D 600-200 MG-UNIT per tablet, Take by mouth., Disp: , Rfl:  .  clonazePAM (KLONOPIN) 1 MG tablet, Take by mouth., Disp: , Rfl:  .  EPIPEN 2-PAK 0.3 MG/0.3ML SOAJ injection, , Disp: , Rfl:  .  Eszopiclone (ESZOPICLONE) 3 MG TABS, Take 3 mg by mouth at bedtime. Take immediately before bedtime, Disp: , Rfl:  .  fluticasone (FLONASE) 50 MCG/ACT nasal spray, Place 1 spray into both nostrils 2 (two) times daily., Disp: 16 g, Rfl: 5 .  gabapentin (NEURONTIN) 300 MG capsule, Take by mouth., Disp: , Rfl:  .  HARVONI 90-400 MG TABS, Take 1  tablet by mouth daily., Disp: , Rfl:  .  hydrOXYzine (ATARAX/VISTARIL) 50 MG tablet, TAKE 1 TABLET BY MOUTH EACH NIGHT AT BEDTIME, Disp: 30 tablet, Rfl: 2 .  hydrOXYzine (VISTARIL) 50 MG capsule, Take 1 capsule by mouth 2 (two) times daily as needed., Disp: , Rfl:  .  lamoTRIgine (LAMICTAL) 200 MG tablet, Take 1 tablet by mouth daily., Disp: , Rfl:  .  levocetirizine (XYZAL) 5 MG tablet, Take 1 tablet (5 mg total) by mouth every evening., Disp: 30 tablet, Rfl: 2 .  Linaclotide (LINZESS) 290 MCG CAPS capsule, Take 290 mcg by mouth daily., Disp: , Rfl:  .  meclizine (ANTIVERT) 25 MG tablet, Take 25 mg by mouth 3 (three) times daily as needed for dizziness., Disp: , Rfl:  .  meloxicam (MOBIC) 7.5 MG tablet, Take 7.5 mg by mouth daily., Disp: , Rfl: 0 .  montelukast (SINGULAIR) 10 MG tablet, Take 10 mg by mouth at bedtime., Disp: , Rfl:  .  nystatin (MYCOSTATIN) 100000 UNIT/ML suspension, Take 5 mLs (500,000 Units total) by mouth 4 (four) times daily., Disp: 120 mL, Rfl: 0 .  omeprazole (PRILOSEC) 20 MG capsule, Take 1 capsule (20 mg total) by mouth daily. For 14 days then wean off., Disp: 30 capsule, Rfl: 2 .  ondansetron (ZOFRAN) 4 MG tablet, Take 4 mg by mouth every 8 (eight) hours as needed for nausea or vomiting., Disp: , Rfl:  .  Polyethylene Glycol 3350 (MIRALAX PO), Take by mouth., Disp: , Rfl:  .  polyethylene glycol powder (GLYCOLAX/MIRALAX) powder, USE 1 SCOOP EVERY DAY AS NEEDED, Disp: , Rfl: 5 .  prazosin (MINIPRESS) 2 MG capsule, Take 1 capsule by mouth daily., Disp: , Rfl:  .  promethazine (PHENERGAN) 25 MG tablet, TAKE 1 TABLET BY MOUTH EVERY 6 HOURS AS NEEDED, Disp: 30 tablet, Rfl: 1 .  rOPINIRole (REQUIP) 2 MG tablet, Take 2 mg by mouth 2 (two) times daily., Disp: , Rfl: 4 .  SUMAtriptan (IMITREX) 100 MG tablet, Take 100 mg by mouth every 2 (two) hours as needed for migraine. May repeat in 2 hours if headache persists or recurs., Disp: , Rfl:  .  SUMAtriptan (IMITREX) 50 MG tablet,  Take 50 mg by mouth every 2 (two) hours as needed for migraine. May repeat in 2 hours if headache persists or recurs., Disp: , Rfl:  .  SYMBICORT 160-4.5 MCG/ACT inhaler, USE 2 PUFFS 2 TIMES A DAY, Disp: 10.2 Inhaler, Rfl: 5 .  traZODone (DESYREL) 150 MG tablet, Take by mouth at bedtime., Disp: , Rfl:  .  triamcinolone (KENALOG) 0.1 % paste, APPLY TO AFFECTED AREA AS NEEDED, Disp: ,  Rfl: 2  No Known Allergies  Fall Risk: Fall Risk  02/26/2015  Falls in the past year? No    Depression screen [facility] 2/9 02/26/2015 12/28/2014  Decreased Interest 1 1  Down, Depressed, Hopeless 1 1  PHQ - 2 Score 2 2  Altered sleeping 2 3  Tired, decreased energy 2 3  Change in appetite 1 0  Feeling bad or failure about yourself  0 0  Trouble concentrating 0 0  Moving slowly or fidgety/restless 0 0  Suicidal thoughts 0 0  PHQ-9 Score 7 8  Difficult doing work/chores Somewhat difficult Somewhat difficult     ROS  CONSTITUTIONAL: No significant weight changes, fever, chills, weakness or fatigue.  HEENT:  - Eyes: No visual changes.  - Ears: No auditory changes. No pain.  - Nose: No sneezing, congestion, runny nose. - Throat: No sore throat. No changes in swallowing. SKIN: No rash or itching.  CARDIOVASCULAR: No chest pain, chest pressure or chest discomfort. No palpitations or edema.  RESPIRATORY: No shortness of breath, cough or sputum.  GASTROINTESTINAL: No anorexia, nausea, vomiting. No changes in bowel habits. No abdominal pain or blood.  GENITOURINARY: No dysuria. No frequency. No discharge.  NEUROLOGICAL: No headache, dizziness, syncope, paralysis, ataxia, numbness or tingling in the extremities. No memory changes. No change in bowel or bladder control.  MUSCULOSKELETAL: Yes joint pain. No muscle pain. HEMATOLOGIC: No anemia, bleeding or bruising.  LYMPHATICS: No enlarged lymph nodes.  PSYCHIATRIC: No change in mood. No change in sleep pattern.  ENDOCRINOLOGIC: No reports of sweating, cold or  heat intolerance. No polyuria or polydipsia.   Objective  Filed Vitals:   02/26/15 0900  BP: 116/78  Pulse: 97  Temp: 98.5 F (36.9 C)  TempSrc: Oral  Resp: 16  Weight: 174 lb 9.6 oz (79.198 kg)  SpO2: 98%   Body mass index is 25.77 kg/(m^2).  Physical Exam  Constitutional: Patient appears well-developed and well-nourished. In no distress.  HEENT:  - Head: Normocephalic and atraumatic.  - Ears: Bilateral TMs gray, no erythema or effusion - Nose: Nasal mucosa moist - Mouth/Throat: Oropharynx is clear and moist. No tonsillar hypertrophy or erythema. No post nasal drainage. No teeth or dentures. - Eyes: Conjunctivae clear, EOM movements normal. PERRLA. No scleral icterus.  Neck: Normal range of motion. Neck supple. No JVD present. No thyromegaly present.  Cardiovascular: Normal rate, regular rhythm and normal heart sounds.  No murmur heard.  Pulmonary/Chest: Effort normal and breath sounds normal. No respiratory distress. Breast: Bilateral breasts and axillary regions with no masses, dimpling of skin or tenderness.  Musculoskeletal: Normal range of motion bilateral UE and LE, no joint effusions. Peripheral vascular: Bilateral LE no edema. Neurological: CN II-XII grossly intact with no focal deficits. Alert and oriented to person, place, and time. Coordination, balance, strength, speech and gait are normal.  Skin: Skin is warm and dry. No rash noted. No erythema. Scattered brown macules on upper arms, shoulders and back.  Psychiatric: Patient has a normal mood and affect. Behavior is normal in office today. Judgment and thought content normal in office today.   Assessment & Plan 1. Medicare annual wellness visit, subsequent Functional ability/safety issues: No Issues Hearing issues: Addressed  Activities of daily living: Discussed Home safety issues: No Issues  End Of Life Planning: Offered verbal information regarding advanced directives, healthcare power of  attorney.  Preventative care, Health maintenance, Preventative health measures discussed.  Preventative screenings discussed today: lab work, colonoscopy, PAP, mammogram, DEXA.  Does not need  repeat DEXA at this time.  Low Dose CT Chest recommended if Age 66-80 years, 30 pack-year currently smoking OR have quit w/in 15years.   Lifestyle risk factor issued reviewed: Diet, exercise, weight management, advised patient smoking is not healthy, nutrition/diet.  Preventative health measures discussed (5-10 year plan).  Reviewed and recommended vaccinations: - Pneumovax  - Prevnar  - Annual Influenza - Zostavax - Tdap   Depression screening: Done Fall risk screening: Done Discuss ADLs/IADLs: Done  Current medical providers: See HPI  Other health risk factors identified this visit: No other issues Cognitive impairment issues: None identified  All above discussed with patient. Appropriate education, counseling and referral will be made based upon the above.     2. Breast cancer screening EMR AND INSURANCE would not let me order age appropriate breast cancer screening today. Clinical management made aware of situation.    3. Degenerative disc disease, cervical Will refer to spinal specialist, not on opioid medication, avoid.  - Ambulatory referral to Spine Surgery  4. Nontoxic multinodular goiter  - CBC with Differential/Platelet - Comprehensive metabolic panel - TSH - T3, free - T4, free  5. Hyperlipidemia LDL goal <100  - Lipid panel  6. Bilateral leg cramps Likely circulation issue or electrolyte imbalance.   - Magnesium  7. Skin lesions, generalized  - Ambulatory referral to Dermatology   8. Copd, moderate Clinically stable findings based on clinical exam and on review of any pertinent results. Recommended to patient that they continue their current regimen with regular follow ups.

## 2015-02-27 ENCOUNTER — Other Ambulatory Visit: Payer: Self-pay | Admitting: Family Medicine

## 2015-02-27 DIAGNOSIS — Z1231 Encounter for screening mammogram for malignant neoplasm of breast: Secondary | ICD-10-CM

## 2015-02-27 LAB — COMPREHENSIVE METABOLIC PANEL
ALBUMIN: 4.2 g/dL (ref 3.5–5.5)
ALT: 34 IU/L — ABNORMAL HIGH (ref 0–32)
AST: 29 IU/L (ref 0–40)
Albumin/Globulin Ratio: 1.4 (ref 1.1–2.5)
Alkaline Phosphatase: 93 IU/L (ref 39–117)
BILIRUBIN TOTAL: 0.2 mg/dL (ref 0.0–1.2)
BUN / CREAT RATIO: 9 (ref 9–23)
BUN: 9 mg/dL (ref 6–24)
CHLORIDE: 99 mmol/L (ref 97–108)
CO2: 24 mmol/L (ref 18–29)
Calcium: 9.7 mg/dL (ref 8.7–10.2)
Creatinine, Ser: 0.96 mg/dL (ref 0.57–1.00)
GFR calc non Af Amer: 71 mL/min/{1.73_m2} (ref >59)
GFR, EST AFRICAN AMERICAN: 82 mL/min/{1.73_m2} (ref >59)
GLUCOSE: 89 mg/dL (ref 65–99)
Globulin, Total: 2.9 g/dL (ref 1.5–4.5)
Potassium: 4.8 mmol/L (ref 3.5–5.2)
Sodium: 138 mmol/L (ref 134–144)
TOTAL PROTEIN: 7.1 g/dL (ref 6.0–8.5)

## 2015-02-27 LAB — LIPID PANEL
Chol/HDL Ratio: 3.5 ratio units (ref 0.0–4.4)
Cholesterol, Total: 212 mg/dL — ABNORMAL HIGH (ref 100–199)
HDL: 61 mg/dL (ref >39)
LDL CALC: 118 mg/dL — AB (ref 0–99)
Triglycerides: 164 mg/dL — ABNORMAL HIGH (ref 0–149)
VLDL CHOLESTEROL CAL: 33 mg/dL (ref 5–40)

## 2015-02-27 LAB — CBC WITH DIFFERENTIAL/PLATELET
BASOS ABS: 0.1 10*3/uL (ref 0.0–0.2)
BASOS: 1 %
EOS (ABSOLUTE): 0.2 10*3/uL (ref 0.0–0.4)
Eos: 2 %
HEMATOCRIT: 36.8 % (ref 34.0–46.6)
HEMOGLOBIN: 13 g/dL (ref 11.1–15.9)
IMMATURE GRANS (ABS): 0 10*3/uL (ref 0.0–0.1)
Immature Granulocytes: 0 %
Lymphocytes Absolute: 2.2 10*3/uL (ref 0.7–3.1)
Lymphs: 23 %
MCH: 30 pg (ref 26.6–33.0)
MCHC: 35.3 g/dL (ref 31.5–35.7)
MCV: 85 fL (ref 79–97)
MONOCYTES: 10 %
Monocytes Absolute: 0.9 10*3/uL (ref 0.1–0.9)
NEUTROS ABS: 6.1 10*3/uL (ref 1.4–7.0)
Neutrophils: 64 %
Platelets: 378 10*3/uL (ref 150–379)
RBC: 4.33 x10E6/uL (ref 3.77–5.28)
RDW: 13.6 % (ref 12.3–15.4)
WBC: 9.5 10*3/uL (ref 3.4–10.8)

## 2015-02-27 LAB — T4, FREE: Free T4: 1 ng/dL (ref 0.82–1.77)

## 2015-02-27 LAB — TSH: TSH: 1.35 u[IU]/mL (ref 0.450–4.500)

## 2015-02-27 LAB — T3, FREE: T3 FREE: 2.8 pg/mL (ref 2.0–4.4)

## 2015-03-01 ENCOUNTER — Other Ambulatory Visit: Payer: Self-pay | Admitting: Family Medicine

## 2015-03-01 DIAGNOSIS — R110 Nausea: Secondary | ICD-10-CM

## 2015-03-05 ENCOUNTER — Ambulatory Visit (INDEPENDENT_AMBULATORY_CARE_PROVIDER_SITE_OTHER): Payer: Medicare Other | Admitting: Gastroenterology

## 2015-03-05 ENCOUNTER — Other Ambulatory Visit: Payer: Self-pay

## 2015-03-05 VITALS — BP 136/102 | HR 112 | Temp 98.2°F | Ht 69.0 in | Wt 180.0 lb

## 2015-03-05 DIAGNOSIS — B1710 Acute hepatitis C without hepatic coma: Secondary | ICD-10-CM

## 2015-03-05 NOTE — Progress Notes (Signed)
Primary Care Physician: Edwena Felty, MD  Primary Gastroenterologist:  Dr. Midge Minium  Chief Complaint  Patient presents with  . Hepatitis C    Treatment completed    HPI: Sabrina Espinoza is a 49 y.o. female here for follow-up after being treated for hepatitis C. The patient reports that she had some muscle cramps nausea and headaches with the treatment. The patient reports that prior to have a treatment she had a lot of fatigue but states that that has resolved since she has been treated for her hepatitis C.   Current Outpatient Prescriptions  Medication Sig Dispense Refill  . butalbital-acetaminophen-caffeine (FIORICET, ESGIC) 50-325-40 MG per tablet TAKE 1 TABLET EVERY 4 HOURS AS NEEDED 45 tablet 5  . Calcium-Vitamin D 600-200 MG-UNIT per tablet Take by mouth.    . clonazePAM (KLONOPIN) 1 MG tablet Take by mouth.    . EPIPEN 2-PAK 0.3 MG/0.3ML SOAJ injection     . Eszopiclone (ESZOPICLONE) 3 MG TABS Take 3 mg by mouth at bedtime. Take immediately before bedtime    . fluticasone (FLONASE) 50 MCG/ACT nasal spray Place 1 spray into both nostrils 2 (two) times daily. 16 g 5  . gabapentin (NEURONTIN) 300 MG capsule Take by mouth.    . hydrOXYzine (ATARAX/VISTARIL) 50 MG tablet TAKE 1 TABLET BY MOUTH EACH NIGHT AT BEDTIME 30 tablet 2  . hydrOXYzine (VISTARIL) 50 MG capsule Take 1 capsule by mouth 2 (two) times daily as needed.    . lamoTRIgine (LAMICTAL) 200 MG tablet Take 1 tablet by mouth daily.    [name] levocetirizine (XYZAL) 5 MG tablet Take 1 tablet (5 mg total) by mouth every evening. 30 tablet 2  . Linaclotide (LINZESS) 290 MCG CAPS capsule Take 290 mcg by mouth daily.    . meclizine (ANTIVERT) 25 MG tablet Take 25 mg by mouth 3 (three) times daily as needed for dizziness.    . meloxicam (MOBIC) 7.5 MG tablet Take 7.5 mg by mouth daily.  0  . montelukast (SINGULAIR) 10 MG tablet Take 10 mg by mouth at bedtime.    [name] nystatin (MYCOSTATIN) 100000 UNIT/ML suspension Take 5 mLs  (500,000 Units total) by mouth 4 (four) times daily. 120 mL 0  . omeprazole (PRILOSEC) 20 MG capsule Take 1 capsule (20 mg total) by mouth daily. For 14 days then wean off. 30 capsule 2  . ondansetron (ZOFRAN) 4 MG tablet TAKE 1 TABLET BY MOUTH EVERY 6 HOURS AS NEEDED FOR NAUSEA 50 tablet 2  . Polyethylene Glycol 3350 (MIRALAX PO) Take by mouth.    . polyethylene glycol powder (GLYCOLAX/MIRALAX) powder USE 1 SCOOP EVERY DAY AS NEEDED  5  . prazosin (MINIPRESS) 2 MG capsule Take 1 capsule by mouth daily.    . promethazine (PHENERGAN) 25 MG tablet TAKE 1 TABLET BY MOUTH EVERY 6 HOURS AS NEEDED 30 tablet 1  . rOPINIRole (REQUIP) 2 MG tablet Take 2 mg by mouth 2 (two) times daily.  4  . SAPHRIS 5 MG SUBL 24 hr tablet     . SUMAtriptan (IMITREX) 100 MG tablet Take 100 mg by mouth every 2 (two) hours as needed for migraine. May repeat in 2 hours if headache persists or recurs.    . SUMAtriptan (IMITREX) 50 MG tablet Take 50 mg by mouth every 2 (two) hours as needed for migraine. May repeat in 2 hours if headache persists or recurs.    . SYMBICORT 160-4.5 MCG/ACT inhaler USE 2 PUFFS 2 TIMES A DAY 10.2 Inhaler  5  . traZODone (DESYREL) 150 MG tablet Take by mouth at bedtime.    . triamcinolone (KENALOG) 0.1 % paste APPLY TO AFFECTED AREA AS NEEDED  2  . ziprasidone (GEODON) 60 MG capsule TAKE ONE CAPSULE BY MOUTH TWICE A DAY WITH FOOD  4   No current facility-administered medications for this visit.    Allergies as of 03/05/2015  . (No Known Allergies)    ROS:  General: Negative for anorexia, weight loss, fever, chills, fatigue, weakness. ENT: Negative for hoarseness, difficulty swallowing , nasal congestion. CV: Negative for chest pain, angina, palpitations, dyspnea on exertion, peripheral edema.  Respiratory: Negative for dyspnea at rest, dyspnea on exertion, cough, sputum, wheezing.  GI: See history of present illness. GU:  Negative for dysuria, hematuria, urinary incontinence, urinary  frequency, nocturnal urination.  Endo: Negative for unusual weight change.    Physical Examination:   BP 136/102 mmHg  Pulse 112  Temp(Src) 98.2 F (36.8 C) (Oral)  Ht 5\' 9"  (1.753 m)  Wt 180 lb (81.647 kg)  BMI 26.57 kg/m2  LMP 02/25/2015 (Approximate)  General: Well-nourished, well-developed in no acute distress.  Eyes: No icterus. Conjunctivae pink. Extremities: No lower extremity edema. No clubbing or deformities. Neuro: Alert and oriented x 3.  Grossly intact. Skin: Warm and dry, no jaundice.   Psych: Alert and cooperative, normal mood and affect.  Labs:    Imaging Studies: No results found.  Assessment and Plan:   Sabrina Espinoza is a 49 y.o. y/o female who comes in today for follow-up after recently completing treatment for her hepatitis C. The patient will need to have her blood checked today for hepatitis C viral load and liver function tests. The patient will also have the labs repeated in 6 months and a year. The patient has been explained the plan and agrees with it.   Note: This dictation was prepared with Dragon dictation along with smaller phrase technology. Any transcriptional errors that result from this process are unintentional.

## 2015-03-06 ENCOUNTER — Other Ambulatory Visit: Payer: Self-pay | Admitting: Family Medicine

## 2015-03-06 DIAGNOSIS — B370 Candidal stomatitis: Secondary | ICD-10-CM

## 2015-03-07 ENCOUNTER — Other Ambulatory Visit: Payer: Self-pay | Admitting: Family Medicine

## 2015-03-07 ENCOUNTER — Telehealth: Payer: Self-pay

## 2015-03-07 DIAGNOSIS — R928 Other abnormal and inconclusive findings on diagnostic imaging of breast: Secondary | ICD-10-CM | POA: Insufficient documentation

## 2015-03-07 NOTE — Telephone Encounter (Signed)
Please let Asher Muir know I ordered both the tests.

## 2015-03-07 NOTE — Telephone Encounter (Signed)
Sabrina Espinoza from [facility] called and requesting we place an order for Right Breast Ultrasound ([id number] [phone]) and a Diagnostic Uni Right ([phone]) for Right Breast asymmetry once order is complete Sabrina Espinoza will call to schedule the pt.  Thanks

## 2015-03-08 ENCOUNTER — Ambulatory Visit (INDEPENDENT_AMBULATORY_CARE_PROVIDER_SITE_OTHER): Payer: Medicare Other | Admitting: Family Medicine

## 2015-03-08 ENCOUNTER — Encounter: Payer: Self-pay | Admitting: Family Medicine

## 2015-03-08 VITALS — BP 144/92 | HR 98 | Temp 98.5°F | Resp 18 | Wt 183.6 lb

## 2015-03-08 DIAGNOSIS — J392 Other diseases of pharynx: Secondary | ICD-10-CM

## 2015-03-08 DIAGNOSIS — R05 Cough: Secondary | ICD-10-CM | POA: Diagnosis not present

## 2015-03-08 DIAGNOSIS — K5900 Constipation, unspecified: Secondary | ICD-10-CM | POA: Diagnosis not present

## 2015-03-08 DIAGNOSIS — K219 Gastro-esophageal reflux disease without esophagitis: Secondary | ICD-10-CM | POA: Insufficient documentation

## 2015-03-08 DIAGNOSIS — R059 Cough, unspecified: Secondary | ICD-10-CM

## 2015-03-08 DIAGNOSIS — K5909 Other constipation: Secondary | ICD-10-CM

## 2015-03-08 MED ORDER — PANTOPRAZOLE SODIUM 40 MG PO TBEC
40.0000 mg | DELAYED_RELEASE_TABLET | Freq: Every day | ORAL | Status: DC
Start: 2015-03-08 — End: 2015-05-23

## 2015-03-08 MED ORDER — LUBIPROSTONE 24 MCG PO CAPS
24.0000 ug | ORAL_CAPSULE | Freq: Two times a day (BID) | ORAL | Status: DC
Start: 2015-03-08 — End: 2015-08-15

## 2015-03-08 MED ORDER — HYDROCOD POLST-CPM POLST ER 10-8 MG/5ML PO SUER: 5.0000 mL | Freq: Two times a day (BID) | ORAL | Status: DC | PRN

## 2015-03-08 NOTE — Telephone Encounter (Signed)
Notified Jamie, she'll contact pt and schedule the appts

## 2015-03-08 NOTE — Progress Notes (Signed)
Name: Sabrina Espinoza   MRN: [id number]    DOB: [birth year]   Date:03/08/2015       Progress Note  Subjective  Chief Complaint  Chief Complaint  Patient presents with  . Sore Throat    patient stated that she has been bothered with this for quite some time.  . Constipation  . Gastrophageal Reflux    nonresponisve to meds    HPI  Patient is here today with concerns regarding the following symptoms congestion, post nasal drip, ear pressure, sinus pressure, non-productive cough, pain in the back of her throat that started a couple of months ago. Associated with fatigue and malaise. Patient has tried several rounds of antibiotics and a steroid dose pack without getting consistent relief. She has also had recent allergy skin testing which showed significant environmental allergies and was told she may benefit from allergy shots which she is scheduled to start soon. Respiratory history: frequent episodes of bronchitis, COPD and pneumonia.  Paitent complains of heartburn. This has been associated with bilious reflux, cough, heartburn, laryngitis, need to clear throat frequently and waterbrash.  She denies chest pain, choking on food, dysphagia, early satiety, hematemesis and unexpected weight loss. Symptoms have been present for several months. She denies dysphagia.  She has not lost weight. She denies melena, hematochezia, hematemesis, and coffee ground emesis. Medical therapy in the past has included antacids, H2 antagonists and proton pump inhibitors (Zantac, Pepcid, prevacid, tums, omeprazole 20mg )  She also complains of chronic constipation. Having BMs after 3-4 days with straining and incomplete sensation. Has tried Linzess caused abdomina pain, miralax, metamucil, Xlax, colace with minimal help.    Patient Active Problem List   Diagnosis Date Noted  . Pain pharynx 03/08/2015  . Abnormal mammogram of right breast 03/07/2015  . Annual physical exam 02/26/2015  . Breast cancer screening  02/26/2015  . Degenerative disc disease, cervical 02/26/2015  . Screening cholesterol level 02/26/2015  . Bilateral leg cramps 02/26/2015  . Skin lesions, generalized 02/26/2015  . Influenza vaccination declined by patient 02/26/2015  . Pneumococcal vaccination declined by patient 02/26/2015  . Allergic rhinitis with postnasal drip 01/31/2015  . Cough 01/31/2015  . Chronic sinusitis with recurrent bronchitis 01/31/2015  . Oral thrush 01/31/2015  . History of cocaine use 01/16/2015  . 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 Chronic hepatitis C without hepatic coma 01/16/2015  . Chronic constipation 01/16/2015  . COPD, moderate 01/16/2015  . Cigarette smoker 01/16/2015  . Bipolar 1 disorder, mixed, partial remission 01/16/2015  . Migraine without aura and without status migrainosus, not intractable 01/16/2015  . Depression with anxiety 01/16/2015  . Cephalalgia 03/06/2014  . Imbalance 03/06/2014  . Insomnia due to medical condition 03/06/2014    Social History  Substance Use Topics  . Smoking status: Current Every Day Smoker    Types: Cigarettes  . Smokeless tobacco: Not on file  . Alcohol Use: No     Current outpatient prescriptions:  .  butalbital-acetaminophen-caffeine (FIORICET, ESGIC) 50-325-40 MG per tablet, TAKE 1 TABLET EVERY 4 HOURS AS NEEDED, Disp: 45 tablet, Rfl: 5 .  Calcium-Vitamin D 600-200 MG-UNIT per tablet, Take by mouth., Disp: , Rfl:  .  clonazePAM (KLONOPIN) 1 MG tablet, Take by mouth., Disp: , Rfl:  .  EPIPEN 2-PAK 0.3 MG/0.3ML SOAJ injection, , Disp: , Rfl:  .  Eszopiclone (ESZOPICLONE) 3 MG TABS, Take 3 mg by mouth at bedtime. Take immediately before bedtime, Disp: , Rfl:  .  fluticasone (FLONASE) 50 MCG/ACT nasal spray,  Place 1 spray into both nostrils 2 (two) times daily., Disp: 16 g, Rfl: 5 .  gabapentin (NEURONTIN) 300 MG capsule, Take by mouth., Disp: , Rfl:  .  hydrOXYzine (ATARAX/VISTARIL) 50 MG tablet, TAKE 1 TABLET BY MOUTH EACH NIGHT AT  BEDTIME, Disp: 30 tablet, Rfl: 2 .  lamoTRIgine (LAMICTAL) 200 MG tablet, Take 1 tablet by mouth daily., Disp: , Rfl:  .  levocetirizine (XYZAL) 5 MG tablet, Take 1 tablet (5 mg total) by mouth every evening., Disp: 30 tablet, Rfl: 2 .  meclizine (ANTIVERT) 25 MG tablet, Take 25 mg by mouth 3 (three) times daily as needed for dizziness., Disp: , Rfl:  .  meloxicam (MOBIC) 7.5 MG tablet, Take 7.5 mg by mouth daily., Disp: , Rfl: 0 .  montelukast (SINGULAIR) 10 MG tablet, Take 10 mg by mouth at bedtime., Disp: , Rfl:  .  ondansetron (ZOFRAN) 4 MG tablet, TAKE 1 TABLET BY MOUTH EVERY 6 HOURS AS NEEDED FOR NAUSEA, Disp: 50 tablet, Rfl: 2 .  polyethylene glycol powder (GLYCOLAX/MIRALAX) powder, USE 1 SCOOP EVERY DAY AS NEEDED, Disp: , Rfl: 5 .  prazosin (MINIPRESS) 2 MG capsule, Take 1 capsule by mouth daily., Disp: , Rfl:  .  promethazine (PHENERGAN) 25 MG tablet, TAKE 1 TABLET BY MOUTH EVERY 6 HOURS AS NEEDED, Disp: 30 tablet, Rfl: 1 .  rOPINIRole (REQUIP) 2 MG tablet, Take 2 mg by mouth 2 (two) times daily., Disp: , Rfl: 4 .  SAPHRIS 5 MG SUBL 24 hr tablet, , Disp: , Rfl:  .  SUMAtriptan (IMITREX) 100 MG tablet, Take 100 mg by mouth every 2 (two) hours as needed for migraine. May repeat in 2 hours if headache persists or recurs., Disp: , Rfl:  .  SYMBICORT 160-4.5 MCG/ACT inhaler, USE 2 PUFFS 2 TIMES A DAY, Disp: 10.2 Inhaler, Rfl: 5 .  traZODone (DESYREL) 150 MG tablet, Take by mouth at bedtime., Disp: , Rfl:  .  ziprasidone (GEODON) 60 MG capsule, TAKE ONE CAPSULE BY MOUTH TWICE A DAY WITH FOOD, Disp: , Rfl: 4  Past Surgical History  Procedure Laterality Date  . Ectopic pregnancy surgery  2003  . Ankle fracture surgery  1988    rods and pins in place  . Cesarean section  1996    Family History  Problem Relation Age of Onset  . Heart disease Father   . Alcohol abuse Father   . Diabetes Father   . Heart disease Brother   . Depression Brother   . Stroke Brother   . Alcohol abuse  Paternal Grandfather     No Known Allergies   Review of Systems  CONSTITUTIONAL: No significant weight changes, fever, chills, weakness. Some fatigue.  HEENT:  - Eyes: No visual changes.  - Ears: No auditory changes. No pain.  - Nose: No sneezing, congestion, runny nose. - Throat: Yes sore throat. No changes in swallowing. SKIN: No rash or itching.  CARDIOVASCULAR: No chest pain, chest pressure or chest discomfort. No palpitations or edema.  RESPIRATORY: No shortness of breath. Yes cough.  GASTROINTESTINAL: No anorexia, nausea, vomiting. Chronic constipation. No abdominal pain or blood.  GENITOURINARY: No dysuria. No frequency. No discharge.  NEUROLOGICAL: No headache, dizziness, syncope, paralysis, ataxia, numbness or tingling in the extremities. No memory changes. No change in bowel or bladder control.  MUSCULOSKELETAL: Chronic joint pain. No muscle pain. HEMATOLOGIC: No anemia, bleeding or bruising.  LYMPHATICS: No enlarged lymph nodes.  PSYCHIATRIC: No change in mood. No change in sleep pattern.  ENDOCRINOLOGIC: No reports of sweating, cold or heat intolerance. No polyuria or polydipsia.     Objective  BP 144/92 mmHg  Pulse 98  Temp(Src) 98.5 F (36.9 C) (Oral)  Resp 18  Wt 183 lb 9.6 oz (83.28 kg)  SpO2 95%  LMP 02/25/2015 (Approximate) Body mass index is 27.1 kg/(m^2).  Physical Exam  Constitutional: Patient appears well-developed and well-nourished. In no distress.  HEENT:  - Head: Normocephalic and atraumatic.  - Ears: Bilateral TMs gray, no erythema or effusion - Nose: Nasal mucosa moist, boggy. - Mouth/Throat: Oropharynx is clear and moist. No tonsillar hypertrophy or erythema. Some post nasal drainage.  - Eyes: Conjunctivae clear, EOM movements normal. PERRLA. No scleral icterus.  Neck: Normal range of motion. Neck supple. No JVD present. No thyromegaly present.  Cardiovascular: Normal rate, regular rhythm and normal heart sounds.  No murmur heard.   Pulmonary/Chest: Effort normal and breath sounds mildly decreased in all lung fields without wheezing rales or rhonchi. No respiratory distress. Musculoskeletal: Normal range of motion bilateral UE and LE, no joint effusions. Peripheral vascular: Bilateral LE no edema. Abdomen: Soft, NT/ND, Normal BS in all four quadrants. Neurological: CN II-XII grossly intact with no focal deficits. Alert and oriented to person, place, and time. Coordination, balance, strength, speech and gait are normal.  Skin: Skin is warm and dry. No rash noted. No erythema.  Psychiatric: Patient has a stable mood and affect. Behavior is normal in office today. Judgment and thought content normal in office today.   Recent Results (from the past 2160 hour(s))  CBC with Differential/Platelet     Status: None   Collection Time: 02/26/15  9:44 AM  Result Value Ref Range   WBC 9.5 3.4 - 10.8 x10E3/uL   RBC 4.33 3.77 - 5.28 x10E6/uL   Hemoglobin 13.0 11.1 - 15.9 g/dL   Hematocrit 16.1 09.6 - 46.6 %   MCV 85 79 - 97 fL   MCH 30.0 26.6 - 33.0 pg   MCHC 35.3 31.5 - 35.7 g/dL   RDW 04.5 40.9 - 81.1 %   Platelets 378 150 - 379 x10E3/uL   Neutrophils 64 %   Lymphs 23 %   Monocytes 10 %   Eos 2 %   Basos 1 %   Neutrophils Absolute 6.1 1.4 - 7.0 x10E3/uL   Lymphocytes Absolute 2.2 0.7 - 3.1 x10E3/uL   Monocytes Absolute 0.9 0.1 - 0.9 x10E3/uL   EOS (ABSOLUTE) 0.2 0.0 - 0.4 x10E3/uL   Basophils Absolute 0.1 0.0 - 0.2 x10E3/uL   Immature Granulocytes 0 %   Immature Grans (Abs) 0.0 0.0 - 0.1 x10E3/uL  Comprehensive metabolic panel     Status: Abnormal   Collection Time: 02/26/15  9:44 AM  Result Value Ref Range   Glucose 89 65 - 99 mg/dL   BUN 9 6 - 24 mg/dL   Creatinine, Ser 9.14 0.57 - 1.00 mg/dL   GFR calc non Af Amer 71 >59 mL/min/1.73   GFR calc Af Amer 82 >59 mL/min/1.73   BUN/Creatinine Ratio 9 9 - 23   Sodium 138 134 - 144 mmol/L   Potassium 4.8 3.5 - 5.2 mmol/L   Chloride 99 97 - 108 mmol/L   CO2 24 18 -  29 mmol/L   Calcium 9.7 8.7 - 10.2 mg/dL   Total Protein 7.1 6.0 - 8.5 g/dL   Albumin 4.2 3.5 - 5.5 g/dL   Globulin, Total 2.9 1.5 - 4.5 g/dL   Albumin/Globulin Ratio 1.4 1.1 - 2.5  Bilirubin Total 0.2 0.0 - 1.2 mg/dL   Alkaline Phosphatase 93 39 - 117 IU/L   AST 29 0 - 40 IU/L   ALT 34 (H) 0 - 32 IU/L  Lipid panel     Status: Abnormal   Collection Time: 02/26/15  9:44 AM  Result Value Ref Range   Cholesterol, Total 212 (H) 100 - 199 mg/dL   Triglycerides 161 (H) 0 - 149 mg/dL   HDL 61 >09 mg/dL    Comment: According to ATP-III Guidelines, HDL-C >59 mg/dL is considered a negative risk factor for CHD.    VLDL Cholesterol Cal 33 5 - 40 mg/dL   LDL Calculated 604 (H) 0 - 99 mg/dL   Chol/HDL Ratio 3.5 0.0 - 4.4 ratio units    Comment:                                   T. Chol/HDL Ratio                                             Men  Women                               1/2 Avg.Risk  3.4    3.3                                   Avg.Risk  5.0    4.4                                2X Avg.Risk  9.6    7.1                                3X Avg.Risk 23.4   11.0   TSH     Status: None   Collection Time: 02/26/15  9:44 AM  Result Value Ref Range   TSH 1.350 0.450 - 4.500 uIU/mL  T3, free     Status: None   Collection Time: 02/26/15  9:44 AM  Result Value Ref Range   T3, Free 2.8 2.0 - 4.4 pg/mL  T4, free     Status: None   Collection Time: 02/26/15  9:44 AM  Result Value Ref Range   Free T4 1.00 0.82 - 1.77 ng/dL     Assessment & Plan  1. Pain pharynx Needs to be scoped and see what if there is any pathology in the nasopharynx area. Other etiologies considered is poorly controlled GERD, sore throat to her inhalers for COPD.  - Ambulatory referral to ENT  2. Cough  - Ambulatory referral to ENT - chlorpheniramine-HYDROcodone (TUSSIONEX PENNKINETIC ER) 10-8 MG/5ML SUER; Take 5 mLs by mouth every 12 (twelve) hours as needed for cough.  Dispense: 100 mL; Refill: 0  3. Chronic  constipation Has tried several medications with minimal relief. Will try to see if we can get Amitiza approved.  - lubiprostone (AMITIZA) 24 MCG capsule; Take 1 capsule (24 mcg total) by mouth 2 (two) times daily with a meal.  Dispense: 60 capsule; Refill: 2  4.  GERD without esophagitis Switch to Pantoprazole 40 mg a day.  Dexilant may be helpful but not sure if insurance will approve it.   - pantoprazole (PROTONIX) 40 MG tablet; Take 1 tablet (40 mg total) by mouth daily.  Dispense: 30 tablet; Refill: 2 - Ambulatory referral to ENT

## 2015-03-13 ENCOUNTER — Telehealth: Payer: Self-pay

## 2015-03-13 ENCOUNTER — Telehealth: Payer: Self-pay | Admitting: Family Medicine

## 2015-03-13 NOTE — Telephone Encounter (Signed)
Pt would like a prescription for nicotine patch or something to help her quit smoking. I explained to the pt to find out what her insurance will cover and call us back and she states she has tried and cannot get in touch with anyone.

## 2015-03-13 NOTE — Telephone Encounter (Signed)
Patient was informed that she has an appt on 03/20/15 @ 2pm with Dr. Andee Poles at [facility].

## 2015-03-15 ENCOUNTER — Other Ambulatory Visit: Payer: Self-pay | Admitting: Family Medicine

## 2015-03-15 DIAGNOSIS — F17210 Nicotine dependence, cigarettes, uncomplicated: Secondary | ICD-10-CM

## 2015-03-15 DIAGNOSIS — R110 Nausea: Secondary | ICD-10-CM

## 2015-03-15 MED ORDER — NICOTINE 21 MG/24HR TD PT24: 21.0000 mg | MEDICATED_PATCH | Freq: Every day | TRANSDERMAL | Status: DC

## 2015-03-15 NOTE — Telephone Encounter (Signed)
Nicotine patch sent to her [facility] pharmacy.

## 2015-03-16 ENCOUNTER — Ambulatory Visit
Admission: RE | Admit: 2015-03-16 | Discharge: 2015-03-16 | Disposition: A | Discharge status: Home / Self Care | Payer: Medicare Other | Source: Ambulatory Visit | Attending: Family Medicine | Admitting: Family Medicine

## 2015-03-16 ENCOUNTER — Other Ambulatory Visit: Payer: Self-pay | Admitting: Family Medicine

## 2015-03-16 ENCOUNTER — Ambulatory Visit (INDEPENDENT_AMBULATORY_CARE_PROVIDER_SITE_OTHER): Payer: Medicare Other | Admitting: Family Medicine

## 2015-03-16 ENCOUNTER — Encounter: Payer: Self-pay | Admitting: Family Medicine

## 2015-03-16 VITALS — BP 126/86 | HR 100 | Temp 98.4°F | Resp 16 | Wt 183.1 lb

## 2015-03-16 DIAGNOSIS — N6489 Other specified disorders of breast: Secondary | ICD-10-CM | POA: Insufficient documentation

## 2015-03-16 DIAGNOSIS — M79671 Pain in right foot: Secondary | ICD-10-CM | POA: Diagnosis not present

## 2015-03-16 DIAGNOSIS — R928 Other abnormal and inconclusive findings on diagnostic imaging of breast: Secondary | ICD-10-CM

## 2015-03-16 MED ORDER — OXYCODONE-ACETAMINOPHEN 5-325 MG PO TABS
1.0000 | ORAL_TABLET | Freq: Four times a day (QID) | ORAL | Status: DC | PRN
Start: 2015-03-16 — End: 2015-03-22

## 2015-03-16 NOTE — Patient Instructions (Addendum)
IF YOU HAVE A Metatarsal Fracture, Undisplaced A metatarsal fracture is a break in the bone(s) of the foot. These are the bones of the foot that connect your toes to the bones of the ankle. DIAGNOSIS  The diagnoses of these fractures are usually made with X-rays. If there are problems in the forefoot and x-rays are normal a later bone scan will usually make the diagnosis.  TREATMENT AND HOME CARE INSTRUCTIONS  Treatment may or may not include a cast or walking shoe. When casts are needed the use is usually for short periods of time so as not to slow down healing with muscle wasting (atrophy).  Activities should be stopped until further advised by your caregiver.  Wear shoes with adequate shock absorbing capabilities and stiff soles.  Alternative exercise may be undertaken while waiting for healing. These may include bicycling and swimming, or as your caregiver suggests.  It is important to keep all follow-up visits or specialty referrals. The failure to keep these appointments could result in improper bone healing and chronic pain or disability.  Warning: Do not drive a car or operate a motor vehicle until your caregiver specifically tells you it is safe to do so. IF YOU DO NOT HAVE A CAST OR SPLINT:  You may walk on your injured foot as tolerated or advised.  Do not put any weight on your injured foot for as long as directed by your caregiver. Slowly increase the amount of time you walk on the foot as the pain allows or as advised.  Use crutches until you can bear weight without pain. A gradual increase in weight bearing may help.  Apply ice to the injury for 15-20 minutes each hour while awake for the first 2 days. Put the ice in a plastic bag and place a towel between the bag of ice and your skin.  Only take over-the-counter or prescription medicines for pain, discomfort, or fever as directed by your caregiver. SEEK IMMEDIATE MEDICAL CARE IF:   Your cast gets damaged or  breaks.  You have continued severe pain or more swelling than you did before the cast was put on, or the pain is not controlled with medications.  Your skin or nails below the injury turn blue or grey, or feel cold or numb.  There is a bad smell, or new stains or pus-like (purulent) drainage coming from the cast. MAKE SURE YOU:   Understand these instructions.  Will watch your condition.  Will get help right away if you are not doing well or get worse. Document Released: 03/15/2002 Document Revised: 09/15/2011 Document Reviewed: 02/04/2008 [facility] Patient Information 2015 [place], [place]. This information is not intended to replace advice given to you by your health care provider. Make sure you discuss any questions you have with your health care provider.

## 2015-03-16 NOTE — Progress Notes (Signed)
Name: Sabrina Espinoza   MRN: [id number]    DOB: 1965-12-20   Date:03/16/2015       Progress Note  Subjective  Chief Complaint  Chief Complaint  Patient presents with  . Foot Pain    right foot pain and swelling  . Foot Swelling    HPI  Sabrina Espinoza is a 49 year old female with frequent visits here today to discuss new onset right foot pain with swelling. She denies items falling onto foot, no falls, no jumping from elevated heights. Location right foot middle to toes. She denies rash, fevers, calf pain, shortness of breath. Sabrina Espinoza does have to limp around as the pain at the forefoot is worse. Has been elevating it and NSAIDs are not helping. In the past she has broken the same foot in the same area, was seen at [facility], air cast placed at the time, no surgical correction needed.   Patient Active Problem List   Diagnosis Date Noted  . Foot pain, right 03/16/2015  . Pain pharynx 03/08/2015  . GERD without esophagitis 03/08/2015  . Abnormal mammogram of right breast 03/07/2015  . Degenerative disc disease, cervical 02/26/2015  . Bilateral leg cramps 02/26/2015  . Skin lesions, generalized 02/26/2015  . Allergic rhinitis with postnasal drip 01/31/2015  . Chronic sinusitis with recurrent bronchitis 01/31/2015  . Oral thrush 01/31/2015  . History of cocaine use 01/16/2015  . 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 Chronic hepatitis C without hepatic coma 01/16/2015  . Chronic constipation 01/16/2015  . COPD, moderate 01/16/2015  . Cigarette smoker 01/16/2015  . Bipolar 1 disorder, mixed, partial remission 01/16/2015  . Migraine without aura and without status migrainosus, not intractable 01/16/2015  . Depression with anxiety 01/16/2015  . Cephalalgia 03/06/2014  . Imbalance 03/06/2014  . Insomnia due to medical condition 03/06/2014    Social History  Substance Use Topics  . Smoking status: Current Every Day Smoker    Types: Cigarettes  . Smokeless tobacco: Not  on file  . Alcohol Use: No     Current outpatient prescriptions:  .  butalbital-acetaminophen-caffeine (FIORICET, ESGIC) 50-325-40 MG per tablet, TAKE 1 TABLET EVERY 4 HOURS AS NEEDED, Disp: 45 tablet, Rfl: 5 .  Calcium-Vitamin D 600-200 MG-UNIT per tablet, Take by mouth., Disp: , Rfl:  .  chlorpheniramine-HYDROcodone (TUSSIONEX PENNKINETIC ER) 10-8 MG/5ML SUER, Take 5 mLs by mouth every 12 (twelve) hours as needed for cough., Disp: 100 mL, Rfl: 0 .  clonazePAM (KLONOPIN) 1 MG tablet, Take by mouth., Disp: , Rfl:  .  EPIPEN 2-PAK 0.3 MG/0.3ML SOAJ injection, , Disp: , Rfl:  .  Eszopiclone (ESZOPICLONE) 3 MG TABS, Take 3 mg by mouth at bedtime. Take immediately before bedtime, Disp: , Rfl:  .  fluticasone (FLONASE) 50 MCG/ACT nasal spray, Place 1 spray into both nostrils 2 (two) times daily., Disp: 16 g, Rfl: 5 .  gabapentin (NEURONTIN) 300 MG capsule, Take by mouth., Disp: , Rfl:  .  hydrOXYzine (ATARAX/VISTARIL) 50 MG tablet, TAKE 1 TABLET BY MOUTH EACH NIGHT AT BEDTIME, Disp: 30 tablet, Rfl: 2 .  lamoTRIgine (LAMICTAL) 200 MG tablet, Take 1 tablet by mouth daily., Disp: , Rfl:  .  levocetirizine (XYZAL) 5 MG tablet, Take 1 tablet (5 mg total) by mouth every evening., Disp: 30 tablet, Rfl: 2 .  lubiprostone (AMITIZA) 24 MCG capsule, Take 1 capsule (24 mcg total) by mouth 2 (two) times daily with a meal., Disp: 60 capsule, Rfl: 2 .  meclizine (ANTIVERT) 25 MG  tablet, Take 25 mg by mouth 3 (three) times daily as needed for dizziness., Disp: , Rfl:  .  meloxicam (MOBIC) 7.5 MG tablet, Take 7.5 mg by mouth daily., Disp: , Rfl: 0 .  montelukast (SINGULAIR) 10 MG tablet, Take 10 mg by mouth at bedtime., Disp: , Rfl:  .  nicotine (NICODERM CQ - DOSED IN MG/24 HOURS) 21 mg/24hr patch, Place 1 patch (21 mg total) onto the skin daily., Disp: 28 patch, Rfl: 1 .  ondansetron (ZOFRAN) 4 MG tablet, TAKE 1 TABLET BY MOUTH EVERY 6 HOURS AS NEEDED FOR NAUSEA, Disp: 50 tablet, Rfl: 2 .  oxyCODONE-acetaminophen  (ROXICET) 5-325 MG per tablet, Take 1 tablet by mouth every 6 (six) hours as needed for severe pain., Disp: 30 tablet, Rfl: 0 .  pantoprazole (PROTONIX) 40 MG tablet, Take 1 tablet (40 mg total) by mouth daily., Disp: 30 tablet, Rfl: 2 .  polyethylene glycol powder (GLYCOLAX/MIRALAX) powder, USE 1 SCOOP EVERY DAY AS NEEDED, Disp: , Rfl: 5 .  prazosin (MINIPRESS) 2 MG capsule, Take 1 capsule by mouth daily., Disp: , Rfl:  .  promethazine (PHENERGAN) 25 MG tablet, TAKE 1 TABLET BY MOUTH EVERY 6 HOURS AS NEEDED, Disp: 40 tablet, Rfl: 5 .  rOPINIRole (REQUIP) 2 MG tablet, Take 2 mg by mouth 2 (two) times daily., Disp: , Rfl: 4 .  SAPHRIS 5 MG SUBL 24 hr tablet, , Disp: , Rfl:  .  SUMAtriptan (IMITREX) 100 MG tablet, Take 100 mg by mouth every 2 (two) hours as needed for migraine. May repeat in 2 hours if headache persists or recurs., Disp: , Rfl:  .  SYMBICORT 160-4.5 MCG/ACT inhaler, USE 2 PUFFS 2 TIMES A DAY, Disp: 10.2 Inhaler, Rfl: 5 .  traZODone (DESYREL) 150 MG tablet, Take by mouth at bedtime., Disp: , Rfl:  .  ziprasidone (GEODON) 60 MG capsule, TAKE ONE CAPSULE BY MOUTH TWICE A DAY WITH FOOD, Disp: , Rfl: 4  Past Surgical History  Procedure Laterality Date  . Ectopic pregnancy surgery  2003  . Ankle fracture surgery  1988    rods and pins in place  . Cesarean section  1996    Family History  Problem Relation Age of Onset  . Heart disease Father   . Alcohol abuse Father   . Diabetes Father   . Heart disease Brother   . Depression Brother   . Stroke Brother   . Alcohol abuse Paternal Grandfather     No Known Allergies   Review of Systems  CONSTITUTIONAL: No significant weight changes, fever, chills, weakness or fatigue.  SKIN: No rash or itching.  CARDIOVASCULAR: No chest pain, chest pressure or chest discomfort. No palpitations or edema.  RESPIRATORY: No shortness of breath, cough or sputum.  NEUROLOGICAL: No headache, dizziness, syncope, paralysis, ataxia, numbness or  tingling in the extremities. No memory changes. No change in bowel or bladder control.  MUSCULOSKELETAL: Yes joint pain. No muscle pain. HEMATOLOGIC: No anemia, bleeding or bruising.  LYMPHATICS: No enlarged lymph nodes.  PSYCHIATRIC: No change in mood. No change in sleep pattern.  ENDOCRINOLOGIC: No reports of sweating, cold or heat intolerance. No polyuria or polydipsia.     Objective  BP 126/86 mmHg  Pulse 100  Temp(Src) 98.4 F (36.9 C)  Resp 16  Wt 183 lb 2 oz (83.065 kg)  SpO2 95%  LMP 02/25/2015 (Approximate) Body mass index is 27.03 kg/(m^2).  Physical Exam  Constitutional: Patient appears well-developed and well-nourished. In no distress. .  Cardiovascular:  Normal rate, regular rhythm and normal heart sounds.  No murmur heard.  Pulmonary/Chest: Effort normal and breath sounds normal. No respiratory distress. Musculoskeletal: Normal range of motion bilateral UE and LE. Right foot mild swelling mid foot extension to toes with warmth no drainage no erythema. Pain over 2nd-3rd metatarsal. Flexion extension at ankle normal ROM, flexion extension of toes limited. Peripheral vascular: Bilateral LE no edema. Skin: Skin is warm and dry. No rash noted.  Psychiatric: Patient has a stable mood and affect. Behavior is normal in office today. Judgment and thought content normal in office today.   Recent Results (from the past 2160 hour(s))  CBC with Differential/Platelet     Status: None   Collection Time: 02/26/15  9:44 AM  Result Value Ref Range   WBC 9.5 3.4 - 10.8 x10E3/uL   RBC 4.33 3.77 - 5.28 x10E6/uL   Hemoglobin 13.0 11.1 - 15.9 g/dL   Hematocrit 40.9 81.1 - 46.6 %   MCV 85 79 - 97 fL   MCH 30.0 26.6 - 33.0 pg   MCHC 35.3 31.5 - 35.7 g/dL   RDW 91.4 78.2 - 95.6 %   Platelets 378 150 - 379 x10E3/uL   Neutrophils 64 %   Lymphs 23 %   Monocytes 10 %   Eos 2 %   Basos 1 %   Neutrophils Absolute 6.1 1.4 - 7.0 x10E3/uL   Lymphocytes Absolute 2.2 0.7 - 3.1 x10E3/uL    Monocytes Absolute 0.9 0.1 - 0.9 x10E3/uL   EOS (ABSOLUTE) 0.2 0.0 - 0.4 x10E3/uL   Basophils Absolute 0.1 0.0 - 0.2 x10E3/uL   Immature Granulocytes 0 %   Immature Grans (Abs) 0.0 0.0 - 0.1 x10E3/uL  Comprehensive metabolic panel     Status: Abnormal   Collection Time: 02/26/15  9:44 AM  Result Value Ref Range   Glucose 89 65 - 99 mg/dL   BUN 9 6 - 24 mg/dL   Creatinine, Ser 2.13 0.57 - 1.00 mg/dL   GFR calc non Af Amer 71 >59 mL/min/1.73   GFR calc Af Amer 82 >59 mL/min/1.73   BUN/Creatinine Ratio 9 9 - 23   Sodium 138 134 - 144 mmol/L   Potassium 4.8 3.5 - 5.2 mmol/L   Chloride 99 97 - 108 mmol/L   CO2 24 18 - 29 mmol/L   Calcium 9.7 8.7 - 10.2 mg/dL   Total Protein 7.1 6.0 - 8.5 g/dL   Albumin 4.2 3.5 - 5.5 g/dL   Globulin, Total 2.9 1.5 - 4.5 g/dL   Albumin/Globulin Ratio 1.4 1.1 - 2.5   Bilirubin Total 0.2 0.0 - 1.2 mg/dL   Alkaline Phosphatase 93 39 - 117 IU/L   AST 29 0 - 40 IU/L   ALT 34 (H) 0 - 32 IU/L  Lipid panel     Status: Abnormal   Collection Time: 02/26/15  9:44 AM  Result Value Ref Range   Cholesterol, Total 212 (H) 100 - 199 mg/dL   Triglycerides 086 (H) 0 - 149 mg/dL   HDL 61 >57 mg/dL    Comment: According to ATP-III Guidelines, HDL-C >59 mg/dL is considered a negative risk factor for CHD.    VLDL Cholesterol Cal 33 5 - 40 mg/dL   LDL Calculated 846 (H) 0 - 99 mg/dL   Chol/HDL Ratio 3.5 0.0 - 4.4 ratio units    Comment:  T. Chol/HDL Ratio                                             Men  Women                               1/2 Avg.Risk  3.4    3.3                                   Avg.Risk  5.0    4.4                                2X Avg.Risk  9.6    7.1                                3X Avg.Risk 23.4   11.0   TSH     Status: None   Collection Time: 02/26/15  9:44 AM  Result Value Ref Range   TSH 1.350 0.450 - 4.500 uIU/mL  T3, free     Status: None   Collection Time: 02/26/15  9:44 AM  Result Value Ref Range    T3, Free 2.8 2.0 - 4.4 pg/mL  T4, free     Status: None   Collection Time: 02/26/15  9:44 AM  Result Value Ref Range   Free T4 1.00 0.82 - 1.77 ng/dL     Assessment & Plan   1. Foot pain, right Home care instructions given. Will rule out fracture and refer to podiatry.  - DG Foot Complete Right; Future - oxyCODONE-acetaminophen (ROXICET) 5-325 MG per tablet; Take 1 tablet by mouth every 6 (six) hours as needed for severe pain.  Dispense: 30 tablet; Refill: 0 - Ambulatory referral to Podiatry

## 2015-03-19 ENCOUNTER — Telehealth: Payer: Self-pay

## 2015-03-19 NOTE — Telephone Encounter (Signed)
Quantity #30 given, she will have to make it last and alternative with Ibuprofen 800 mg a day. No further pain medication will be prescribed.

## 2015-03-19 NOTE — Telephone Encounter (Signed)
I notified Elen of her appointment with [facility]. The soonest I could get her in is next Tuesday 03-27-2015. She says she will run out of pain medication and is asking for more. Please call her. Thanks

## 2015-03-21 ENCOUNTER — Ambulatory Visit: Payer: Medicare Other | Admitting: Family Medicine

## 2015-03-22 ENCOUNTER — Ambulatory Visit (INDEPENDENT_AMBULATORY_CARE_PROVIDER_SITE_OTHER): Payer: Medicare Other | Admitting: Family Medicine

## 2015-03-22 ENCOUNTER — Encounter: Payer: Self-pay | Admitting: Family Medicine

## 2015-03-22 VITALS — BP 132/78 | HR 96 | Temp 98.0°F | Resp 16 | Wt 178.1 lb

## 2015-03-22 DIAGNOSIS — B370 Candidal stomatitis: Secondary | ICD-10-CM | POA: Diagnosis not present

## 2015-03-22 DIAGNOSIS — K5900 Constipation, unspecified: Secondary | ICD-10-CM | POA: Diagnosis not present

## 2015-03-22 DIAGNOSIS — R0982 Postnasal drip: Secondary | ICD-10-CM | POA: Diagnosis not present

## 2015-03-22 DIAGNOSIS — J40 Bronchitis, not specified as acute or chronic: Secondary | ICD-10-CM

## 2015-03-22 DIAGNOSIS — J449 Chronic obstructive pulmonary disease, unspecified: Secondary | ICD-10-CM | POA: Diagnosis not present

## 2015-03-22 DIAGNOSIS — J329 Chronic sinusitis, unspecified: Secondary | ICD-10-CM

## 2015-03-22 DIAGNOSIS — Z79899 Other long term (current) drug therapy: Secondary | ICD-10-CM

## 2015-03-22 DIAGNOSIS — J45909 Unspecified asthma, uncomplicated: Secondary | ICD-10-CM

## 2015-03-22 DIAGNOSIS — M79671 Pain in right foot: Secondary | ICD-10-CM

## 2015-03-22 DIAGNOSIS — M5030 Other cervical disc degeneration, unspecified cervical region: Secondary | ICD-10-CM

## 2015-03-22 DIAGNOSIS — K5909 Other constipation: Secondary | ICD-10-CM

## 2015-03-22 DIAGNOSIS — J309 Allergic rhinitis, unspecified: Secondary | ICD-10-CM

## 2015-03-22 MED ORDER — OXYCODONE-ACETAMINOPHEN 5-325 MG PO TABS: 1.0000 | ORAL_TABLET | Freq: Four times a day (QID) | ORAL | Status: DC | PRN

## 2015-03-22 MED ORDER — POLYETHYLENE GLYCOL 3350 17 GM/SCOOP PO POWD
1.0000 | Freq: Every day | ORAL | Status: DC
Start: 2015-03-22 — End: 2015-04-20

## 2015-03-22 MED ORDER — HYDROXYZINE HCL 50 MG PO TABS: 50.0000 mg | ORAL_TABLET | Freq: Every evening | ORAL | Status: DC | PRN

## 2015-03-22 MED ORDER — LEVOCETIRIZINE DIHYDROCHLORIDE 5 MG PO TABS
5.0000 mg | ORAL_TABLET | Freq: Every evening | ORAL | Status: DC
Start: 2015-03-22 — End: 2015-11-02

## 2015-03-22 MED ORDER — VENTOLIN HFA 108 (90 BASE) MCG/ACT IN AERS: 1.0000 | INHALATION_SPRAY | Freq: Four times a day (QID) | RESPIRATORY_TRACT | Status: DC | PRN

## 2015-03-22 NOTE — Progress Notes (Signed)
Name: Sabrina Espinoza   MRN: [id number]    DOB: 09-Mar-1966   Date:03/22/2015       Progress Note  Subjective  Chief Complaint  Chief Complaint  Patient presents with  . Cough  . Medication Refill    pain pills    HPI  Sabrina Espinoza is a 49 year old female with frequent office visits here again today with concerns regarding the following symptoms congestion, post nasal drip, ear pressure, sinus pressure, non-productive cough, pain in the back of her throat that started a couple of months ago. I have recently referred to ENT as I felt her reoccurent symptoms were allergy and sinus related and it was best to avoid frequent antibiotic use. On review of the referral she was to have seen Dr. Andee Poles on 03/20/15 @ 2pm. She reports establishing care with ENT but then went to UC the next day and got doxycycline which she is taking now. Associated with fatigue and malaise. Patient has tried several rounds of antibiotics and a steroid dose pack without getting consistent relief. She has also had recent allergy skin testing which showed significant environmental allergies and was told she may benefit from allergy shots which she is scheduled to start soon. Respiratory history: frequent episodes of bronchitis, COPD and pneumonia. Unrelated to these symptoms she also saw me last week for acute right foot pain.  She denied items falling onto foot, no falls, no jumping from elevated heights. Location right foot middle to toes.  X-ray at the time showed no acute fracture, conservative therapy at home recommended and she was referred back to her podiatrist. On that visit on 03/16/15 she was prescribed Percocet 5-325 mg one po q 6 hrs prn for severe pain only quantity #30. Today she is requesting further refills. She also reports to me that the pain management specialist she was referred to will not be able to view her film regarding her cervical spine disc disease so she needs new imaging done that can be placed on a film  for her Neurosurgical specialist to view. Otherwise she needs refills of chronic constipation medication, COPD medication and allergy medication.   Active Ambulatory Problems    Diagnosis Date Noted  . Cephalalgia 03/06/2014  . Imbalance 03/06/2014  . Insomnia due to medical condition 03/06/2014  . History of cocaine use 01/16/2015  . 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 Chronic hepatitis C without hepatic coma 01/16/2015  . Chronic constipation 01/16/2015  . COPD, moderate 01/16/2015  . Cigarette smoker 01/16/2015  . Bipolar 1 disorder, mixed, partial remission 01/16/2015  . Migraine without aura and without status migrainosus, not intractable 01/16/2015  . Depression with anxiety 01/16/2015  . Allergic rhinitis with postnasal drip 01/31/2015  . Chronic sinusitis with recurrent bronchitis 01/31/2015  . Oral thrush 01/31/2015  . Degenerative disc disease, cervical 02/26/2015  . Bilateral leg cramps 02/26/2015  . Skin lesions, generalized 02/26/2015  . Abnormal mammogram of right breast 03/07/2015  . Pain pharynx 03/08/2015  . GERD without esophagitis 03/08/2015  . Foot pain, right 03/16/2015  . Polypharmacy 03/22/2015   Resolved Ambulatory Problems    Diagnosis Date Noted  . Cough 01/31/2015  . Annual physical exam 02/26/2015  . Breast cancer screening 02/26/2015  . Screening cholesterol level 02/26/2015  . Influenza vaccination declined by patient 02/26/2015  . Pneumococcal vaccination declined by patient 02/26/2015   Past Medical History  Diagnosis Date  . Chronic hepatitis C with hepatic coma   . Hypothyroidism, adult   .  Vitamin D deficiency   . Nicotine dependence, uncomplicated   . Migraine without status migrainosus, not intractable   . Rhinitis, allergic   . History of chicken pox   . History of pneumonia     Past Medical History  Diagnosis Date  . Chronic hepatitis C with hepatic coma     Followed by Dr. Servando Snare. Starting [facility] treatment for 12 weeks  in 2016  . Hypothyroidism, adult     Working with Dr. Horton Chin, Endocrinology. S/P RAIU and is planning on biopsy  . Nontoxic multinodular goiter     FNA done 10/30/14 with benign results. Seen [name]. Patient may want a second opinion.  . Vitamin D deficiency   . Nicotine dependence, uncomplicated   . Depression with anxiety     Followed by Dr. Lynett Fish, psychiatrist at [facility] and [facility] for thrapy  . Migraine without status migrainosus, not intractable   . Rhinitis, allergic   . COPD, moderate   . Chronic constipation     on Linzess, Miralax, Zofran  . History of chicken pox   . History of cocaine use   . History of pneumonia     Social History  Substance Use Topics  . Smoking status: Current Every Day Smoker    Types: Cigarettes  . Smokeless tobacco: Not on file  . Alcohol Use: No     Current outpatient prescriptions:  .  azelastine (ASTELIN) 0.1 % nasal spray, , Disp: , Rfl:  .  butalbital-acetaminophen-caffeine (FIORICET, ESGIC) 50-325-40 MG per tablet, TAKE 1 TABLET EVERY 4 HOURS AS NEEDED, Disp: 45 tablet, Rfl: 5 .  Calcium Carb-Cholecalciferol (CALCIUM CARBONATE-VITAMIN D3) 600-400 MG-UNIT TABS, Take by mouth., Disp: , Rfl:  .  Calcium-Vitamin D 600-200 MG-UNIT per tablet, Take by mouth., Disp: , Rfl:  .  chlorpheniramine-HYDROcodone (TUSSIONEX PENNKINETIC ER) 10-8 MG/5ML SUER, Take 5 mLs by mouth every 12 (twelve) hours as needed for cough., Disp: 100 mL, Rfl: 0 .  clonazePAM (KLONOPIN) 1 MG tablet, Take by mouth., Disp: , Rfl:  .  clotrimazole (MYCELEX) 10 MG troche, , Disp: , Rfl:  .  doxycycline (VIBRAMYCIN) 100 MG capsule, , Disp: , Rfl:  .  EPIPEN 2-PAK 0.3 MG/0.3ML SOAJ injection, , Disp: , Rfl:  .  Eszopiclone (ESZOPICLONE) 3 MG TABS, Take 3 mg by mouth at bedtime. Take immediately before bedtime, Disp: , Rfl:  .  fluticasone (FLONASE) 50 MCG/ACT nasal spray, Place 1 spray into both nostrils 2 (two) times daily., Disp: 16 g,  Rfl: 5 .  gabapentin (NEURONTIN) 300 MG capsule, Take by mouth., Disp: , Rfl:  .  gentamicin ointment (GARAMYCIN) 0.1 %, , Disp: , Rfl:  .  hydrOXYzine (ATARAX/VISTARIL) 50 MG tablet, TAKE 1 TABLET BY MOUTH EACH NIGHT AT BEDTIME, Disp: 30 tablet, Rfl: 2 .  lamoTRIgine (LAMICTAL) 200 MG tablet, Take 1 tablet by mouth daily., Disp: , Rfl:  .  levocetirizine (XYZAL) 5 MG tablet, Take 1 tablet (5 mg total) by mouth every evening., Disp: 30 tablet, Rfl: 2 .  lubiprostone (AMITIZA) 24 MCG capsule, Take 1 capsule (24 mcg total) by mouth 2 (two) times daily with a meal., Disp: 60 capsule, Rfl: 2 .  meclizine (ANTIVERT) 25 MG tablet, Take 25 mg by mouth 3 (three) times daily as needed for dizziness., Disp: , Rfl:  .  meloxicam (MOBIC) 7.5 MG tablet, Take 7.5 mg by mouth daily., Disp: , Rfl: 0 .  montelukast (SINGULAIR) 10 MG tablet, Take 10 mg by  mouth at bedtime., Disp: , Rfl:  .  nicotine (NICODERM CQ - DOSED IN MG/24 HOURS) 21 mg/24hr patch, Place 1 patch (21 mg total) onto the skin daily., Disp: 28 patch, Rfl: 1 .  ondansetron (ZOFRAN) 4 MG tablet, TAKE 1 TABLET BY MOUTH EVERY 6 HOURS AS NEEDED FOR NAUSEA, Disp: 50 tablet, Rfl: 2 .  oxyCODONE-acetaminophen (ROXICET) 5-325 MG per tablet, Take 1 tablet by mouth every 6 (six) hours as needed for severe pain., Disp: 30 tablet, Rfl: 0 .  pantoprazole (PROTONIX) 40 MG tablet, Take 1 tablet (40 mg total) by mouth daily., Disp: 30 tablet, Rfl: 2 .  polyethylene glycol powder (GLYCOLAX/MIRALAX) powder, USE 1 SCOOP EVERY DAY AS NEEDED, Disp: , Rfl: 5 .  prazosin (MINIPRESS) 2 MG capsule, Take 1 capsule by mouth daily., Disp: , Rfl:  .  predniSONE (DELTASONE) 20 MG tablet, , Disp: , Rfl:  .  promethazine (PHENERGAN) 25 MG tablet, TAKE 1 TABLET BY MOUTH EVERY 6 HOURS AS NEEDED, Disp: 40 tablet, Rfl: 5 .  rOPINIRole (REQUIP) 2 MG tablet, Take 2 mg by mouth 2 (two) times daily., Disp: , Rfl: 4 .  SAPHRIS 5 MG SUBL 24 hr tablet, , Disp: , Rfl:  .  SUMAtriptan  (IMITREX) 100 MG tablet, Take 100 mg by mouth every 2 (two) hours as needed for migraine. May repeat in 2 hours if headache persists or recurs., Disp: , Rfl:  .  SYMBICORT 160-4.5 MCG/ACT inhaler, USE 2 PUFFS 2 TIMES A DAY, Disp: 10.2 Inhaler, Rfl: 5 .  traZODone (DESYREL) 150 MG tablet, Take by mouth at bedtime., Disp: , Rfl:  .  VENTOLIN HFA 108 (90 BASE) MCG/ACT inhaler, , Disp: , Rfl:  .  ziprasidone (GEODON) 60 MG capsule, TAKE ONE CAPSULE BY MOUTH TWICE A DAY WITH FOOD, Disp: , Rfl: 4  No Known Allergies  ROS  CONSTITUTIONAL: No significant weight changes, fever, chills, weakness or fatigue.  HEENT:  - Eyes: No visual changes.  - Ears: No auditory changes. No pain.  - Nose: Yes sneezing, congestion, runny nose. - Throat: Yes sore throat. No changes in swallowing. SKIN: No rash or itching.  CARDIOVASCULAR: No chest pain, chest pressure or chest discomfort. No palpitations or edema.  RESPIRATORY: No shortness of breath. Yes cough with sputum.  GASTROINTESTINAL: No anorexia, nausea, vomiting. No changes in bowel habits. No abdominal pain or blood.  GENITOURINARY: No dysuria. No frequency. No discharge.  NEUROLOGICAL: No headache, dizziness, syncope, paralysis, ataxia, numbness or tingling in the extremities. No memory changes. No change in bowel or bladder control.  MUSCULOSKELETAL: Yes joint pain. No muscle pain. HEMATOLOGIC: No anemia, bleeding or bruising.  LYMPHATICS: No enlarged lymph nodes.  PSYCHIATRIC: No change in mood. No change in sleep pattern.  ENDOCRINOLOGIC: No reports of sweating, cold or heat intolerance. No polyuria or polydipsia.     Objective  Filed Vitals:   03/22/15 1411  BP: 132/78  Pulse: 96  Temp: 98 F (36.7 C)  TempSrc: Oral  Resp: 16  Weight: 178 lb 1.6 oz (80.786 kg)  SpO2: 96%   Body mass index is 26.29 kg/(m^2).   Physical Exam  Constitutional: Patient appears well-developed and well-nourished. In no acute distress. Voice is strained  today. HEENT:  - Head: Normocephalic and atraumatic.  - Ears: RIGHT TM bulging with minimal clear exudate, LEFT TM bulging with minimal clear exudate.  - Nose: Nasal mucosa boggy and congested.  - Mouth/Throat: Oropharynx is moist with slight erythema of bilateral tonsils without  hypertrophy or exudates. Post nasal drainage present. Scattered white patches.  - Eyes: Conjunctivae clear, EOM movements normal. PERRLA. No scleral icterus.  Neck: Normal range of motion. Neck supple. No JVD present. No thyromegaly present. No local lymphadenopathy. Cardiovascular: Regular rate, regular rhythm with no murmurs heard.  Pulmonary/Chest: Effort normal and breath sounds reduced in all lung fields without rales, rhonchi or wheezing.  Musculoskeletal: Normal range of motion bilateral UE and LE, no joint effusions. Cervical spine no palpable step off, some paraspinal muscle tension in trapezius muscles, Positive spurling sign bilaterally. Right foot mild swelling mid foot extension to toes with warmth no drainage no erythema. Pain over 2nd-3rd metatarsal. Flexion extension at ankle normal ROM, flexion extension of toes limited. Skin: Skin is warm and dry. No rash noted. Psychiatric: Patient has a stable mood and affect. Behavior is normal in office today. Judgment and thought content normal in office today.   Assessment & Plan  1. Allergic rhinitis with postnasal drip Continue management with ENT.   - hydrOXYzine (ATARAX/VISTARIL) 50 MG tablet; Take 1 tablet (50 mg total) by mouth at bedtime as needed.  Dispense: 30 tablet; Refill: 5 - levocetirizine (XYZAL) 5 MG tablet; Take 1 tablet (5 mg total) by mouth every evening.  Dispense: 30 tablet; Refill: 5  2. Chronic sinusitis with recurrent bronchitis I don't think she needs th doxycycline but she insists on using it.  - hydrOXYzine (ATARAX/VISTARIL) 50 MG tablet; Take 1 tablet (50 mg total) by mouth at bedtime as needed.  Dispense: 30 tablet; Refill: 5 -  VENTOLIN HFA 108 (90 BASE) MCG/ACT inhaler; Inhale 1-2 puffs into the lungs every 6 (six) hours as needed for wheezing or shortness of breath.  Dispense: 8 g; Refill: 5  3. Oral thrush Likely due to frequent antibiotic use. Finished treatment sent in by ENT.   4. Foot pain, right No acute fracture on X-ray reviewed reported findings again with patient today. Likely due to osteoarthritic changes seen on X-ray, may need to check uric acid and rule out gout if symptoms persist. Will be seeing Podiatrist next Tuesday, I have refilled her pain medication until then with a lecture stating this medication is not for long term use.    - oxyCODONE-acetaminophen (ROXICET) 5-325 MG per tablet; Take 1 tablet by mouth every 6 (six) hours as needed for severe pain.  Dispense: 30 tablet; Refill: 0  5. Degenerative disc disease, cervical Chronic pain in neck region, needs repeat imaging for specialist to view.   - DG Cervical Spine With Flex & Extend; Future  6. COPD, moderate Needs to stop smoking.   - VENTOLIN HFA 108 (90 BASE) MCG/ACT inhaler; Inhale 1-2 puffs into the lungs every 6 (six) hours as needed for wheezing or shortness of breath.  Dispense: 8 g; Refill: 5  7. Chronic constipation Refilled. Reasonably well controled.   - polyethylene glycol powder (GLYCOLAX/MIRALAX) powder; Take 255 g by mouth daily.  Dispense: 500 g; Refill: 5  8. Polypharmacy Reviewed medications and expressed to patient that she is on too many medications and needs to reassess need.  Etiologies include allergic rhinitis, viral or bacterial infection. Instructed patient on increasing hydration, nasal saline spray, steam inhalation, NSAID if tolerated and not contraindicated. If not already doing so start taking daily anti-histamine and use a steroid nasal spray. Establish care of ENT specialty.   Frequent complaints of URI or Sinusitis symptoms likely related to allergic rhinitis component. She was skin tested positive  for several elements and was  encouraged to start allergy shots to help control her symptoms.

## 2015-03-23 NOTE — Telephone Encounter (Signed)
done

## 2015-04-04 ENCOUNTER — Ambulatory Visit
Admission: RE | Admit: 2015-04-04 | Discharge: 2015-04-04 | Disposition: A | Discharge status: Home / Self Care | Payer: Medicare Other | Source: Ambulatory Visit | Attending: Family Medicine | Admitting: Family Medicine

## 2015-04-04 DIAGNOSIS — M5030 Other cervical disc degeneration, unspecified cervical region: Secondary | ICD-10-CM

## 2015-04-18 ENCOUNTER — Telehealth: Payer: Self-pay | Admitting: Family Medicine

## 2015-04-18 NOTE — Telephone Encounter (Signed)
Make appointment for next available appointment and ask patient to bring her home BP machine if possible. If BP gets worse in the mean time go to ER.

## 2015-04-18 NOTE — Telephone Encounter (Signed)
Patient was told that if her blood pressure keeps elevating that a prescription would be called in. States today her bp is 141/101 would like to know if she need to be seen (no appointments available) or if you would call her in something.

## 2015-04-19 NOTE — Telephone Encounter (Signed)
Appointment made for 04-20-15 and states that she does not have a home blood pressure machine. She says she is just going off of what the other doctors are telling her.

## 2015-04-20 ENCOUNTER — Encounter: Payer: Self-pay | Admitting: Family Medicine

## 2015-04-20 ENCOUNTER — Ambulatory Visit (INDEPENDENT_AMBULATORY_CARE_PROVIDER_SITE_OTHER): Payer: Medicare Other | Admitting: Family Medicine

## 2015-04-20 VITALS — BP 122/70 | HR 83 | Temp 98.6°F | Resp 16 | Wt 184.4 lb

## 2015-04-20 DIAGNOSIS — G43009 Migraine without aura, not intractable, without status migrainosus: Secondary | ICD-10-CM

## 2015-04-20 DIAGNOSIS — R030 Elevated blood-pressure reading, without diagnosis of hypertension: Secondary | ICD-10-CM | POA: Diagnosis not present

## 2015-04-20 DIAGNOSIS — IMO0001 Reserved for inherently not codable concepts without codable children: Secondary | ICD-10-CM

## 2015-04-20 MED ORDER — BUTALBITAL-APAP-CAFFEINE 50-325-40 MG PO TABS: 1.0000 | ORAL_TABLET | Freq: Four times a day (QID) | ORAL | Status: DC | PRN

## 2015-04-20 NOTE — Progress Notes (Signed)
Name: Sabrina Espinoza   MRN: [id number]    DOB: 1965/09/22   Date:04/20/2015       Progress Note  Subjective  Chief Complaint  Chief Complaint  Patient presents with  . Hypertension    patient stated her BP 141/101 when she was seen at the podiatrist and so they wanted her to f/u with her pcp.    HPI  Patient is here because her blood pressure was high at the podiatry office a few weeks ago. They took her BP manually she states. She denied any symptoms of worsening headaches, pre-syncope, chest pain, palpitations, or being in pain more than usual. Today her blood pressure taken by two separate individuals is less than 140/90.   She would like an increased quantity of her fioricet medication.    Past Medical History  Diagnosis Date  . Chronic hepatitis C with hepatic coma (HCC)     Followed by Dr. Servando Snare. Starting [facility] treatment for 12 weeks in 2016  . Hypothyroidism, adult     Working with Dr. Horton Chin, Endocrinology. S/P RAIU and is planning on biopsy  . Nontoxic multinodular goiter     FNA done 10/30/14 with benign results. Seen [name]. Patient may want a second opinion.  . Vitamin D deficiency   . Nicotine dependence, uncomplicated   . Depression with anxiety     Followed by Dr. Lynett Fish, psychiatrist at [facility] and [facility] for thrapy  . Migraine without status migrainosus, not intractable   . Rhinitis, allergic   . COPD, moderate (HCC)   . Chronic constipation     on Linzess, Miralax, Zofran  . History of chicken pox   . History of cocaine use   . History of pneumonia     Patient Active Problem List   Diagnosis Date Noted  . Elevated blood pressure 04/20/2015  . Polypharmacy 03/22/2015  . Foot pain, right 03/16/2015  . Pain pharynx 03/08/2015  . GERD without esophagitis 03/08/2015  . Abnormal mammogram of right breast 03/07/2015  . Degenerative disc disease, cervical 02/26/2015  . Bilateral leg cramps 02/26/2015  . Skin  lesions, generalized 02/26/2015  . Allergic rhinitis with postnasal drip 01/31/2015  . Chronic sinusitis with recurrent bronchitis 01/31/2015  . Oral thrush 01/31/2015  . History of cocaine use 01/16/2015  . 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 Chronic hepatitis C without hepatic coma (HCC) 01/16/2015  . Chronic constipation 01/16/2015  . COPD, moderate (HCC) 01/16/2015  . Cigarette smoker 01/16/2015  . Bipolar 1 disorder, mixed, partial remission (HCC) 01/16/2015  . Migraine without aura and without status migrainosus, not intractable 01/16/2015  . Depression with anxiety 01/16/2015  . Cephalalgia 03/06/2014  . Imbalance 03/06/2014  . Insomnia due to medical condition 03/06/2014    Social History  Substance Use Topics  . Smoking status: Current Every Day Smoker    Types: Cigarettes  . Smokeless tobacco: Not on file  . Alcohol Use: No     Current outpatient prescriptions:  .  divalproex (DEPAKOTE ER) 500 MG 24 hr tablet, Take by mouth., Disp: , Rfl:  .  azelastine (ASTELIN) 0.1 % nasal spray, , Disp: , Rfl:  .  butalbital-acetaminophen-caffeine (FIORICET, ESGIC) 50-325-40 MG tablet, Take 1 tablet by mouth every 6 (six) hours as needed for headache., Disp: 60 tablet, Rfl: 5 .  Calcium Carb-Cholecalciferol (CALCIUM CARBONATE-VITAMIN D3) 600-400 MG-UNIT TABS, Take by mouth., Disp: , Rfl:  .  Calcium-Vitamin D 600-200 MG-UNIT per tablet, Take by mouth., Disp: ,  Rfl:  .  clonazePAM (KLONOPIN) 1 MG tablet, Take by mouth., Disp: , Rfl:  .  clotrimazole (MYCELEX) 10 MG troche, , Disp: , Rfl:  .  doxycycline (VIBRAMYCIN) 100 MG capsule, , Disp: , Rfl:  .  EPIPEN 2-PAK 0.3 MG/0.3ML SOAJ injection, , Disp: , Rfl:  .  Eszopiclone (ESZOPICLONE) 3 MG TABS, Take 3 mg by mouth at bedtime. Take immediately before bedtime, Disp: , Rfl:  .  fluticasone (FLONASE) 50 MCG/ACT nasal spray, Place 1 spray into both nostrils 2 (two) times daily., Disp: 16 g, Rfl: 5 .  gabapentin (NEURONTIN) 300 MG  capsule, Take by mouth., Disp: , Rfl:  .  gentamicin ointment (GARAMYCIN) 0.1 %, , Disp: , Rfl:  .  hydrOXYzine (ATARAX/VISTARIL) 50 MG tablet, Take 1 tablet (50 mg total) by mouth at bedtime as needed., Disp: 30 tablet, Rfl: 5 .  lamoTRIgine (LAMICTAL) 200 MG tablet, Take 1 tablet by mouth daily., Disp: , Rfl:  .  levocetirizine (XYZAL) 5 MG tablet, Take 1 tablet (5 mg total) by mouth every evening., Disp: 30 tablet, Rfl: 5 .  lubiprostone (AMITIZA) 24 MCG capsule, Take 1 capsule (24 mcg total) by mouth 2 (two) times daily with a meal., Disp: 60 capsule, Rfl: 2 .  meclizine (ANTIVERT) 25 MG tablet, Take 25 mg by mouth 3 (three) times daily as needed for dizziness., Disp: , Rfl:  .  meloxicam (MOBIC) 7.5 MG tablet, Take 7.5 mg by mouth daily., Disp: , Rfl: 0 .  montelukast (SINGULAIR) 10 MG tablet, Take 10 mg by mouth at bedtime., Disp: , Rfl:  .  nicotine (NICODERM CQ - DOSED IN MG/24 HOURS) 21 mg/24hr patch, Place 1 patch (21 mg total) onto the skin daily., Disp: 28 patch, Rfl: 1 .  ondansetron (ZOFRAN) 4 MG tablet, TAKE 1 TABLET BY MOUTH EVERY 6 HOURS AS NEEDED FOR NAUSEA, Disp: 50 tablet, Rfl: 2 .  oxyCODONE-acetaminophen (ROXICET) 5-325 MG per tablet, Take 1 tablet by mouth every 6 (six) hours as needed for severe pain., Disp: 30 tablet, Rfl: 0 .  pantoprazole (PROTONIX) 40 MG tablet, Take 1 tablet (40 mg total) by mouth daily., Disp: 30 tablet, Rfl: 2 .  prazosin (MINIPRESS) 2 MG capsule, Take 1 capsule by mouth daily., Disp: , Rfl:  .  promethazine (PHENERGAN) 25 MG tablet, TAKE 1 TABLET BY MOUTH EVERY 6 HOURS AS NEEDED, Disp: 40 tablet, Rfl: 5 .  rOPINIRole (REQUIP) 2 MG tablet, Take 2 mg by mouth 2 (two) times daily., Disp: , Rfl: 4 .  SAPHRIS 5 MG SUBL 24 hr tablet, , Disp: , Rfl:  .  SUMAtriptan (IMITREX) 100 MG tablet, Take 100 mg by mouth every 2 (two) hours as needed for migraine. May repeat in 2 hours if headache persists or recurs., Disp: , Rfl:  .  SYMBICORT 160-4.5 MCG/ACT  inhaler, USE 2 PUFFS 2 TIMES A DAY, Disp: 10.2 Inhaler, Rfl: 5 .  traZODone (DESYREL) 150 MG tablet, Take by mouth at bedtime., Disp: , Rfl:  .  VENTOLIN HFA 108 (90 BASE) MCG/ACT inhaler, Inhale 1-2 puffs into the lungs every 6 (six) hours as needed for wheezing or shortness of breath., Disp: 8 g, Rfl: 5  No Known Allergies  Review of Systems  Positive for elevated blood pressure once as mentioned in HPI, otherwise all systems reviewed and are negative.  Objective  BP 122/70 mmHg  Pulse 83  Temp(Src) 98.6 F (37 C) (Oral)  Resp 16  Wt 184 lb 6.4 oz (83.643  kg)  SpO2 96%  LMP  (LMP Unknown)  Body mass index is 27.22 kg/(m^2).   Physical Exam  Constitutional: Patient appears well-developed and well-nourished. In no distress.   Neck: Normal range of motion. Neck supple. No JVD present. No thyromegaly present.  Cardiovascular: Normal rate, regular rhythm and normal heart sounds.  No murmur heard.  Pulmonary/Chest: Effort normal and breath sounds normal. No respiratory distress. Musculoskeletal: Normal range of motion bilateral UE and LE, no joint effusions. Peripheral vascular: Bilateral LE no edema. Skin: Skin is warm and dry. No rash noted. No erythema.  Psychiatric: Patient has a stable mood and affect. Behavior is normal in office today. Judgment and thought content normal in office today.    Assessment & Plan  1. Elevated blood pressure Monitor.   2. Migraine without aura and without status migrainosus, not intractable Refilled.  - butalbital-acetaminophen-caffeine (FIORICET, ESGIC) 50-325-40 MG tablet; Take 1 tablet by mouth every 6 (six) hours as needed for headache.  Dispense: 60 tablet; Refill: 5

## 2015-04-30 ENCOUNTER — Emergency Department
Admission: EM | Admit: 2015-04-30 | Discharge: 2015-05-01 | Disposition: A | Discharge status: Home / Self Care | Payer: No Typology Code available for payment source | Attending: Emergency Medicine | Admitting: Emergency Medicine

## 2015-04-30 ENCOUNTER — Encounter: Payer: Self-pay | Admitting: *Deleted

## 2015-04-30 DIAGNOSIS — S0990XA Unspecified injury of head, initial encounter: Secondary | ICD-10-CM | POA: Insufficient documentation

## 2015-04-30 DIAGNOSIS — Y998 Other external cause status: Secondary | ICD-10-CM | POA: Insufficient documentation

## 2015-04-30 DIAGNOSIS — Z3202 Encounter for pregnancy test, result negative: Secondary | ICD-10-CM | POA: Insufficient documentation

## 2015-04-30 DIAGNOSIS — Z7951 Long term (current) use of inhaled steroids: Secondary | ICD-10-CM | POA: Diagnosis not present

## 2015-04-30 DIAGNOSIS — S299XXA Unspecified injury of thorax, initial encounter: Secondary | ICD-10-CM | POA: Insufficient documentation

## 2015-04-30 DIAGNOSIS — M7918 Myalgia, other site: Secondary | ICD-10-CM

## 2015-04-30 DIAGNOSIS — Z79899 Other long term (current) drug therapy: Secondary | ICD-10-CM | POA: Insufficient documentation

## 2015-04-30 DIAGNOSIS — Z791 Long term (current) use of non-steroidal anti-inflammatories (NSAID): Secondary | ICD-10-CM | POA: Insufficient documentation

## 2015-04-30 DIAGNOSIS — Y9389 Activity, other specified: Secondary | ICD-10-CM | POA: Insufficient documentation

## 2015-04-30 DIAGNOSIS — S199XXA Unspecified injury of neck, initial encounter: Secondary | ICD-10-CM | POA: Diagnosis not present

## 2015-04-30 DIAGNOSIS — Y92410 Unspecified street and highway as the place of occurrence of the external cause: Secondary | ICD-10-CM | POA: Diagnosis not present

## 2015-04-30 DIAGNOSIS — Z720 Tobacco use: Secondary | ICD-10-CM | POA: Insufficient documentation

## 2015-04-30 DIAGNOSIS — S3992XA Unspecified injury of lower back, initial encounter: Secondary | ICD-10-CM | POA: Diagnosis not present

## 2015-04-30 MED ORDER — MORPHINE SULFATE (PF) 2 MG/ML IV SOLN
2.0000 mg | Freq: Once | INTRAVENOUS | Status: AC
  Administered 2015-05-01: 2 mg via INTRAVENOUS
  Filled 2015-04-30: qty 1

## 2015-04-30 MED ORDER — KETOROLAC TROMETHAMINE 30 MG/ML IJ SOLN
30.0000 mg | Freq: Once | INTRAMUSCULAR | Status: AC
  Administered 2015-05-01: 30 mg via INTRAVENOUS
  Filled 2015-04-30: qty 1
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
## 2015-05-01 ENCOUNTER — Telehealth: Payer: Self-pay | Admitting: Family Medicine

## 2015-05-01 ENCOUNTER — Other Ambulatory Visit: Payer: Medicare Other

## 2015-05-01 ENCOUNTER — Emergency Department: Payer: No Typology Code available for payment source

## 2015-05-01 DIAGNOSIS — S199XXA Unspecified injury of neck, initial encounter: Secondary | ICD-10-CM | POA: Diagnosis not present

## 2015-05-01 LAB — URINE DRUG SCREEN, QUALITATIVE (ARMC ONLY)
Amphetamines, Ur Screen: NOT DETECTED
BARBITURATES, UR SCREEN: POSITIVE — AB
BENZODIAZEPINE, UR SCRN: POSITIVE — AB
CANNABINOID 50 NG, UR ~~LOC~~: NOT DETECTED
COCAINE METABOLITE, UR ~~LOC~~: NOT DETECTED
MDMA (Ecstasy)Ur Screen: NOT DETECTED
METHADONE SCREEN, URINE: NOT DETECTED
Opiate, Ur Screen: POSITIVE — AB
Phencyclidine (PCP) Ur S: NOT DETECTED
TRICYCLIC, UR SCREEN: NOT DETECTED

## 2015-05-01 LAB — POC URINE PREG, ED: PREG TEST UR: NEGATIVE

## 2015-05-01 MED ORDER — CYCLOBENZAPRINE HCL 10 MG PO TABS: 10.0000 mg | ORAL_TABLET | Freq: Three times a day (TID) | ORAL | Status: DC | PRN

## 2015-05-01 MED ORDER — CYCLOBENZAPRINE HCL 10 MG PO TABS
ORAL_TABLET | ORAL | Status: AC
  Administered 2015-05-01: 10 mg via ORAL
  Filled 2015-05-01: qty 1

## 2015-05-01 MED ORDER — CYCLOBENZAPRINE HCL 10 MG PO TABS
10.0000 mg | ORAL_TABLET | Freq: Once | ORAL | Status: AC
  Administered 2015-05-01: 10 mg via ORAL
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
## 2015-05-01 NOTE — ED Notes (Signed)
Radiology called and informed pts pregnancy test resulted negative.

## 2015-05-01 NOTE — Discharge Instructions (Signed)
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
## 2015-05-07 ENCOUNTER — Encounter: Payer: Self-pay | Admitting: Family Medicine

## 2015-05-07 ENCOUNTER — Ambulatory Visit (INDEPENDENT_AMBULATORY_CARE_PROVIDER_SITE_OTHER): Payer: Medicare Other | Admitting: Family Medicine

## 2015-05-07 VITALS — BP 118/76 | HR 109 | Temp 98.4°F | Resp 18 | Ht 69.0 in | Wt 187.5 lb

## 2015-05-07 DIAGNOSIS — M545 Low back pain, unspecified: Secondary | ICD-10-CM

## 2015-05-07 DIAGNOSIS — M5030 Other cervical disc degeneration, unspecified cervical region: Secondary | ICD-10-CM

## 2015-05-07 MED ORDER — OXYCODONE-ACETAMINOPHEN 5-325 MG PO TABS: 1.0000 | ORAL_TABLET | Freq: Three times a day (TID) | ORAL | Status: DC | PRN
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
## 2015-05-09 ENCOUNTER — Telehealth: Payer: Self-pay | Admitting: Family Medicine

## 2015-05-09 ENCOUNTER — Other Ambulatory Visit: Payer: Self-pay | Admitting: Family Medicine

## 2015-05-09 DIAGNOSIS — M545 Low back pain, unspecified: Secondary | ICD-10-CM
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
## 2015-05-09 NOTE — Telephone Encounter (Signed)
done

## 2015-05-23 ENCOUNTER — Other Ambulatory Visit: Payer: Self-pay | Admitting: Family Medicine

## 2015-06-15 ENCOUNTER — Other Ambulatory Visit: Payer: Self-pay | Admitting: Family Medicine

## 2015-06-15 ENCOUNTER — Ambulatory Visit: Payer: Medicare Other | Admitting: Family Medicine

## 2015-06-20 ENCOUNTER — Ambulatory Visit: Payer: Self-pay | Admitting: Family Medicine

## 2015-06-21 ENCOUNTER — Telehealth: Payer: Self-pay
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
## 2015-06-25 ENCOUNTER — Other Ambulatory Visit: Payer: Self-pay | Admitting: Family Medicine

## 2015-06-25 DIAGNOSIS — M5030 Other cervical disc degeneration, unspecified cervical region: Secondary | ICD-10-CM

## 2015-06-25 DIAGNOSIS — M545 Low back pain, unspecified: Secondary | ICD-10-CM

## 2015-06-25 NOTE — Telephone Encounter (Signed)
Referral extension ordered.

## 2015-06-26 ENCOUNTER — Telehealth: Payer: Self-pay | Admitting: Family Medicine

## 2015-06-26 MED ORDER — MELOXICAM 15 MG PO TABS: 15.0000 mg | ORAL_TABLET | Freq: Every day | ORAL | Status: DC

## 2015-06-26 NOTE — Telephone Encounter (Signed)
Pain medications I am willing to prescribe are non narcotic which include:  Naproxen Meloxicam Gabapentin Ibuprofen 800mg   Let me know if she is willing to accept one of these.

## 2015-06-26 NOTE — Telephone Encounter (Signed)
Pt states she would like to try the Meloxicam to be [facility] [address]. Pt also wants another RX sent to [facility].

## 2015-06-26 NOTE — Telephone Encounter (Signed)
PT IS ASKING IF YOU CAN GIVE HER SOMETHING FOR PAIN FOR SHE CAN NOT GET INTO PAIN MANAGEMENT UNTIL JAN 10TH AND SHE IS IN LOTS OF PAIN.

## 2015-06-26 NOTE — Telephone Encounter (Signed)
Meloxicam sent to pharmacy. Referral to extend PT was already ordered 06/25/15.

## 2015-07-11 ENCOUNTER — Ambulatory Visit: Payer: Medicare Other | Admitting: Family Medicine

## 2015-07-17 ENCOUNTER — Ambulatory Visit: Payer: Medicare Other | Admitting: Anesthesiology

## 2015-07-27 ENCOUNTER — Ambulatory Visit: Payer: Medicare Other | Admitting: Family Medicine

## 2015-08-01 ENCOUNTER — Telehealth: Payer: Self-pay | Admitting: Family Medicine

## 2015-08-01 ENCOUNTER — Other Ambulatory Visit: Payer: Self-pay

## 2015-08-01 ENCOUNTER — Telehealth: Payer: Self-pay

## 2015-08-01 DIAGNOSIS — N6489 Other specified disorders of breast: Secondary | ICD-10-CM

## 2015-08-01 NOTE — Telephone Encounter (Signed)
NEEDS APPT AT [facility] FOR 46M FU FOR HER R BREAST CHECK UP. THEY TOLD HER THAT YOU ALL WOULD CALL AND MAKE THE APPT SINCE SHE HAS HAD AN ISSUE BEFORE. SAYS CALL HER FIRST BEFORE MAKING THE APPT.

## 2015-08-01 NOTE — Telephone Encounter (Signed)
Diagnostic f/u Mammogram scheduled for March 13 @ 2:00 [facility].

## 2015-08-03 ENCOUNTER — Telehealth: Payer: Self-pay | Admitting: Gastroenterology

## 2015-08-03 NOTE — Telephone Encounter (Signed)
Pt notified of lab results

## 2015-08-03 NOTE — Telephone Encounter (Signed)
Patient called and was wondering how her lab results came out?

## 2015-08-06 ENCOUNTER — Telehealth: Payer: Self-pay | Admitting: Family Medicine

## 2015-08-06 DIAGNOSIS — B370 Candidal stomatitis: Secondary | ICD-10-CM
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
## 2015-08-07 MED ORDER — NYSTATIN 100000 UNIT/ML MT SUSP: 5.0000 mL | Freq: Four times a day (QID) | OROMUCOSAL | Status: DC

## 2015-08-07 NOTE — Telephone Encounter (Signed)
Rx requested has been sent to pharmacy.

## 2015-08-09 ENCOUNTER — Other Ambulatory Visit: Payer: Self-pay

## 2015-08-09 ENCOUNTER — Other Ambulatory Visit: Payer: Self-pay | Admitting: Family Medicine

## 2015-08-09 DIAGNOSIS — J309 Allergic rhinitis, unspecified: Secondary | ICD-10-CM

## 2015-08-09 DIAGNOSIS — J449 Chronic obstructive pulmonary disease, unspecified: Secondary | ICD-10-CM

## 2015-08-09 MED ORDER — BUDESONIDE-FORMOTEROL FUMARATE 160-4.5 MCG/ACT IN AERO: INHALATION_SPRAY | RESPIRATORY_TRACT | Status: DC

## 2015-08-09 MED ORDER — FLUTICASONE PROPIONATE 50 MCG/ACT NA SUSP: 1.0000 | Freq: Two times a day (BID) | NASAL | Status: DC

## 2015-08-15 ENCOUNTER — Other Ambulatory Visit: Payer: Self-pay | Admitting: Anesthesiology

## 2015-08-15 ENCOUNTER — Ambulatory Visit: Payer: Medicare Other | Attending: Anesthesiology | Admitting: Anesthesiology

## 2015-08-15 ENCOUNTER — Telehealth: Payer: Self-pay | Admitting: Anesthesiology

## 2015-08-15 ENCOUNTER — Encounter: Payer: Self-pay | Admitting: Anesthesiology

## 2015-08-15 VITALS — BP 125/91 | HR 86 | Temp 98.3°F | Resp 16 | Ht 69.0 in | Wt 177.0 lb

## 2015-08-15 DIAGNOSIS — F419 Anxiety disorder, unspecified: Secondary | ICD-10-CM | POA: Diagnosis not present

## 2015-08-15 DIAGNOSIS — G8929 Other chronic pain: Secondary | ICD-10-CM | POA: Insufficient documentation

## 2015-08-15 DIAGNOSIS — G4700 Insomnia, unspecified: Secondary | ICD-10-CM | POA: Diagnosis not present

## 2015-08-15 DIAGNOSIS — F319 Bipolar disorder, unspecified: Secondary | ICD-10-CM | POA: Diagnosis not present

## 2015-08-15 DIAGNOSIS — M25511 Pain in right shoulder: Secondary | ICD-10-CM | POA: Diagnosis not present

## 2015-08-15 DIAGNOSIS — M5020 Other cervical disc displacement, unspecified cervical region: Secondary | ICD-10-CM

## 2015-08-15 DIAGNOSIS — M25512 Pain in left shoulder: Secondary | ICD-10-CM | POA: Insufficient documentation

## 2015-08-15 DIAGNOSIS — F4310 Post-traumatic stress disorder, unspecified: Secondary | ICD-10-CM | POA: Diagnosis not present

## 2015-08-15 DIAGNOSIS — M542 Cervicalgia: Secondary | ICD-10-CM | POA: Diagnosis present

## 2015-08-15 DIAGNOSIS — M5030 Other cervical disc degeneration, unspecified cervical region: Secondary | ICD-10-CM | POA: Diagnosis not present

## 2015-08-15 DIAGNOSIS — F3111 Bipolar disorder, current episode manic without psychotic features, mild: Secondary | ICD-10-CM

## 2015-08-15 MED ORDER — MELOXICAM 7.5 MG PO TABS: 7.5000 mg | ORAL_TABLET | Freq: Two times a day (BID) | ORAL | Status: DC

## 2015-08-15 MED ORDER — LIDOCAINE 5 % EX PTCH: 1.0000 | MEDICATED_PATCH | CUTANEOUS | Status: DC

## 2015-08-16 NOTE — Progress Notes (Signed)
Subjective:    Patient ID: Sabrina Espinoza, female    DOB: [birth year], 50 y.o.   MRN: [id number]  HPI  This patient is a 50 year old lady who presented with chronic pain in her neck. She indicated that she is had this pain  Year and that it was not associated with any injury She describes the pain as constant and sharp and radiating to both shoulders The pain also is associated with a constant frontal headache  Pain intensity rating  Subjective pain intensity rating is 70%  Her pain is relieved by lying down and it is aaggravated by sitting  Up  Pain medications She takes several over-the-counter medications include BC Tylenol and ibuprofen She does not take any opioids  Other medications Other medications include aspirin Symbicort Fioricet calcium carbonate Zyrtec Klonopin Mycelex Depakote ER Flonase EpiPen Neurontin hydroxyzine Lamictal Xyzal Singulair Mycostatin Zofran Protonix GlycoLax/MiraLAX Minipress Phenergan Requip Imitrex Ventolin Sonata Lidoderm patches and meloxicam  Allergies There are no known allergies  Past medical history Past medical history is positive for several mental illnesses including manic depressive disease and bipolar disorder and 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  Past surgical history Past surgical history is positive for left ankle surgery 1 cesarean section exploratory laparotomy for tubal pregnancy  Social and economic history This patient smokes one half pack of cigarettes per day and has been smoking for the past 32 year She does not use alcohol She has used marijuana as a teenager but has not  Used it in her adulthood Patient is currently on Social Security disability for mental illness  Family history She is single and lives with her 83 year old child She is para 8+ one [6 miscarriages one abortion and one living child who is 30 years old) Her mother is 64 years old who is alive and has back problems and she has has  had back surgery Her father is age 35 and he is alive buthe has had a heart attack She has one brother who is alive at age 28 but he is an alcoholic She has no sisters  Review of Systems  Constitutional: Negative.  Negative for fever, chills, diaphoresis, activity change, appetite change, fatigue and unexpected weight change.  HENT: Negative.  Negative for congestion, dental problem, drooling, ear discharge, ear pain, facial swelling, hearing loss, mouth sores, nosebleeds, postnasal drip, rhinorrhea, sinus pressure, sneezing, sore throat, tinnitus, trouble swallowing and voice change.   Eyes: Negative.  Negative for photophobia, pain, discharge, redness, itching and visual disturbance.  Respiratory: Negative.  Negative for apnea, cough, choking, chest tightness, shortness of breath, wheezing and stridor.   Cardiovascular: Negative.  Negative for chest pain, palpitations and leg swelling.  Gastrointestinal: Negative.  Negative for nausea, vomiting, abdominal pain, diarrhea, constipation, blood in stool, abdominal distention, anal bleeding and rectal pain.  Endocrine: Negative.  Negative for cold intolerance, heat intolerance, polydipsia, polyphagia and polyuria.  Genitourinary: Negative.  Negative for dysuria, urgency, frequency, hematuria, flank pain, decreased urine volume, enuresis, difficulty urinating, genital sores, menstrual problem, pelvic pain and dyspareunia.  Musculoskeletal: Positive for myalgias, neck pain and neck stiffness. Negative for back pain, joint swelling, arthralgias and gait problem.  Skin: Negative.  Negative for color change, pallor, rash and wound.  Allergic/Immunologic: Negative.  Negative for environmental allergies, food allergies and immunocompromised state.  Neurological: Negative.  Negative for dizziness, tremors, seizures, syncope, facial asymmetry, speech difficulty, weakness, light-headedness, numbness and headaches.  Hematological: Negative.  Negative for  adenopathy. Does not  bruise/bleed easily.  Psychiatric/Behavioral: Positive for behavioral problems, confusion and dysphoric mood. Negative for suicidal ideas, sleep disturbance, self-injury and decreased concentration. The patient is nervous/anxious and is hyperactive.        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 She appears well-developed and well-nourished. No distress.  HENT:  Head: Normocephalic and atraumatic.  Right Ear: External ear normal.  Left Ear: External ear normal.  Nose: Nose normal.  Mouth/Throat: Oropharynx is clear and moist. No oropharyngeal exudate.  Eyes: Conjunctivae and EOM are normal. Pupils are equal, round, and reactive to light. Right eye exhibits no discharge. Left eye exhibits no discharge. No scleral icterus.  Neck: Trachea normal and phonation normal. Decreased carotid pulses present. No JVD present. Spinous process tenderness and muscular tenderness present. No tracheal tenderness present. Carotid bruit is present. Rigidity present. Decreased range of motion present. No tracheal deviation, no edema and no erythema present. No Brudzinski's sign and no Kernig's sign noted. No thyroid mass and no thyromegaly present.    This patient has a one-year history of chronic neck pain which radiates into both shoulders and the pain is associated with constant frontal headache She has decreased range of motion in both flexion  And extension and lateral rotation in the neck  Cardiovascular: Normal rate, regular rhythm, normal heart sounds and intact distal pulses.  Exam reveals no gallop and no friction rub.   No murmur heard. Pulmonary/Chest: Effort normal and breath sounds normal. No stridor. No respiratory distress. She has no wheezes. She has no rales. She exhibits  no tenderness.  Abdominal: Soft. Bowel sounds are normal. She exhibits no distension and no mass. There is no tenderness. There is no rebound and no guarding.  Genitourinary:  Genitourinary examination was deferred  Musculoskeletal: She exhibits tenderness. She exhibits no edema.  She had decreased range of motion in all activities in the neck.  Lymphadenopathy:    She has no cervical adenopathy.  Neurological: She is alert and oriented to person, place, and time. She has normal reflexes. No cranial nerve deficit. She exhibits normal muscle tone. Coordination normal.  Skin: Skin is warm and dry. No rash noted. She is not diaphoretic. No erythema. No pallor.  Psychiatric: She has a normal mood and affect. Thought content normal.  She has multiple mental illnesses including manic de bipolar PTSD personality disorder and insomnia  Nursing note and vitals reviewed.         Assessment & Plan:   Assessment 1 chronic cervical pain 2 cervical degenerative disc disease 3 status post multiple mental illnesses   Plan of management 1  5% Lidoderm patch to be applied for 24 hours p day and to dispensing 30 patches 2  Meloxicam 7.5 mg twice a day after meals and give 42 tablets 3 we will consider cervical epidural steroid injection in the future   New patient       Level 4    Instant Nasreen Goedecke M.D.

## 2015-08-18 ENCOUNTER — Encounter: Payer: Self-pay | Admitting: Emergency Medicine

## 2015-08-18 ENCOUNTER — Emergency Department
Admission: EM | Admit: 2015-08-18 | Discharge: 2015-08-18 | Disposition: A | Discharge status: Home / Self Care | Payer: Medicare Other | Attending: Emergency Medicine | Admitting: Emergency Medicine

## 2015-08-18 ENCOUNTER — Emergency Department: Payer: Medicare Other

## 2015-08-18 DIAGNOSIS — W1839XA Other fall on same level, initial encounter: Secondary | ICD-10-CM | POA: Diagnosis not present

## 2015-08-18 DIAGNOSIS — Z79899 Other long term (current) drug therapy: Secondary | ICD-10-CM | POA: Diagnosis not present

## 2015-08-18 DIAGNOSIS — Y9389 Activity, other specified: Secondary | ICD-10-CM | POA: Insufficient documentation

## 2015-08-18 DIAGNOSIS — S8001XA Contusion of right knee, initial encounter: Secondary | ICD-10-CM | POA: Diagnosis not present

## 2015-08-18 DIAGNOSIS — Y998 Other external cause status: Secondary | ICD-10-CM | POA: Diagnosis not present

## 2015-08-18 DIAGNOSIS — S8991XA Unspecified injury of right lower leg, initial encounter: Secondary | ICD-10-CM | POA: Diagnosis present

## 2015-08-18 DIAGNOSIS — S80211A Abrasion, right knee, initial encounter: Secondary | ICD-10-CM | POA: Diagnosis not present

## 2015-08-18 DIAGNOSIS — Z791 Long term (current) use of non-steroidal anti-inflammatories (NSAID): Secondary | ICD-10-CM | POA: Diagnosis not present

## 2015-08-18 DIAGNOSIS — Y92512 Supermarket, store or market as the place of occurrence of the external cause: Secondary | ICD-10-CM | POA: Insufficient documentation

## 2015-08-18 DIAGNOSIS — F17210 Nicotine dependence, cigarettes, uncomplicated: Secondary | ICD-10-CM | POA: Insufficient documentation

## 2015-08-18 DIAGNOSIS — Z7951 Long term (current) use of inhaled steroids: Secondary | ICD-10-CM | POA: Diagnosis not present

## 2015-08-18 MED ORDER — HYDROCODONE-ACETAMINOPHEN 5-325 MG PO TABS: 1.0000 | ORAL_TABLET | ORAL | Status: DC | PRN

## 2015-08-18 MED ORDER — HYDROCODONE-ACETAMINOPHEN 5-325 MG PO TABS
1.0000 | ORAL_TABLET | Freq: Once | ORAL | Status: AC
  Administered 2015-08-18: 1 via ORAL
  Filled 2015-08-18: qty 1

## 2015-08-18 NOTE — Discharge Instructions (Signed)
Contusion A contusion is a deep bruise. Contusions happen when an injury causes bleeding under the skin. Symptoms of bruising include pain, swelling, and discolored skin. The skin may turn blue, purple, or yellow. HOME CARE   Rest the injured area.  If told, put ice on the injured area.  Put ice in a plastic bag.  Place a towel between your skin and the bag.  Leave the ice on for 20 minutes, 2-3 times per day.  If told, put light pressure (compression) on the injured area using an elastic bandage. Make sure the bandage is not too tight. Remove it and put it back on as told by your doctor.  If possible, raise (elevate) the injured area above the level of your heart while you are sitting or lying down.  Take over-the-counter and prescription medicines only as told by your doctor. GET HELP IF:  Your symptoms do not get better after several days of treatment.  Your symptoms get worse.  You have trouble moving the injured area. GET HELP RIGHT AWAY IF:   You have very bad pain.  You have a loss of feeling (numbness) in a hand or foot.  Your hand or foot turns pale or cold.   This information is not intended to replace advice given to you by your health care provider. Make sure you discuss any questions you have with your health care provider.   Document Released: 12/10/2007 Document Revised: 03/14/2015 Document Reviewed: 11/08/2014 Elsevier Interactive Patient Education 2016 Elsevier [employer]  Cryotherapy Cryotherapy is when you put ice on your injury. Ice helps lessen pain and puffiness (swelling) after an injury. Ice works the best when you start using it in the first 24 to 48 hours after an injury. HOME CARE  Put a dry or damp towel between the ice pack and your skin.  You may press gently on the ice pack.  Leave the ice on for no more than 10 to 20 minutes at a time.  Check your skin after 5 minutes to make sure your skin is okay.  Rest at least 20 minutes between ice  pack uses.  Stop using ice when your skin loses feeling (numbness).  Do not use ice on someone who cannot tell you when it hurts. This includes small children and people with memory problems (dementia). GET HELP RIGHT AWAY IF:  You have white spots on your skin.  Your skin turns blue or pale.  Your skin feels waxy or hard.  Your puffiness gets worse. MAKE SURE YOU:   Understand these instructions.  Will watch your condition.  Will get help right away if you are not doing well or get worse.   This information is not intended to replace advice given to you by your health care provider. Make sure you discuss any questions you have with your health care provider.   Document Released: 12/10/2007 Document Revised: 09/15/2011 Document Reviewed: 02/13/2011 Elsevier Interactive Patient Education 2016 Elsevier Inc.    Continue to do ice and elevation for your knee pain. Norco as needed for severe pain and continue the meloxicam that was prescribed by your doctor. Follow-up with your doctor if any continued problems. You can expect to be sore for one to 2 weeks.

## 2015-08-18 NOTE — ED Notes (Signed)
Fell at [facility] yesterday, pain R knee

## 2015-08-18 NOTE — ED Provider Notes (Signed)
[facility] Emergency Department Provider Note ____________________________________________  Time seen: Approximately 9:18 AM  I have reviewed the triage vital signs and the nursing notes.   HISTORY  Chief Complaint Knee Pain   HPI Sabrina Espinoza is a 50 y.o. female is here with complaint of right knee pain. Patient states that she fell at [facility] last evening and landed directly on her right knee. She complains of pain along with a superficial abrasion. Patient states is very tender to touch. She tells a story of catching her foot on a metal post while she was putting a case of water in her buggy. She denies any head injury or loss of consciousness. She denies any over-the-counter medication prior to her arrival in the emergency room. Patient has been ambulatory since this accident. She denies any prior knee injury.Currently she rates her pain as a 9/10.   Past Medical History  Diagnosis Date  . Chronic hepatitis C with hepatic coma (HCC)     Followed by Dr. Servando Snare. Starting [facility] treatment for 12 weeks in 2016  . Hypothyroidism, adult     Working with Dr. Horton Chin, Endocrinology. S/P RAIU and is planning on biopsy  . Nontoxic multinodular goiter     FNA done 10/30/14 with benign results. Seen [name]. Patient may want a second opinion.  . Vitamin D deficiency   . Nicotine dependence, uncomplicated   . Depression with anxiety     Followed by Dr. Lynett Fish, psychiatrist at [facility] and [facility] Flavin for thrapy  . Migraine without status migrainosus, not intractable   . Rhinitis, allergic   . COPD, moderate (HCC)   . Chronic constipation     on Linzess, Miralax, Zofran  . History of chicken pox   . History of cocaine use   . History of pneumonia     Patient Active Problem List   Diagnosis Date Noted  . Midline low back pain without sciatica 05/07/2015  . Elevated blood pressure 04/20/2015  . Polypharmacy 03/22/2015  . Foot  pain, right 03/16/2015  . Pain pharynx 03/08/2015  . GERD without esophagitis 03/08/2015  . Abnormal mammogram of right breast 03/07/2015  . Degenerative disc disease, cervical 02/26/2015  . Bilateral leg cramps 02/26/2015  . Skin lesions, generalized 02/26/2015  . Allergic rhinitis with postnasal drip 01/31/2015  . Chronic sinusitis with recurrent bronchitis 01/31/2015  . Oral thrush 01/31/2015  . History of cocaine use 01/16/2015  . 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 Chronic hepatitis C without hepatic coma (HCC) 01/16/2015  . Chronic constipation 01/16/2015  . COPD, moderate (HCC) 01/16/2015  . Cigarette smoker 01/16/2015  . Bipolar 1 disorder, mixed, partial remission (HCC) 01/16/2015  . Migraine without aura and without status migrainosus, not intractable 01/16/2015  . Depression with anxiety 01/16/2015  . Cephalalgia 03/06/2014  . Imbalance 03/06/2014  . Insomnia due to medical condition 03/06/2014    Past Surgical History  Procedure Laterality Date  . Ectopic pregnancy surgery  2003  . Ankle fracture surgery  1988    rods and pins in place  . Cesarean section  1996    Current Outpatient Rx  Name  Route  Sig  Dispense  Refill  . Aspirin-Salicylamide-Caffeine (BC HEADACHE POWDER PO)   Oral   Take 2 packets by mouth.         . budesonide-formoterol (SYMBICORT) 160-4.5 MCG/ACT inhaler      USE 2 PUFFS 2 TIMES A DAY   10.2 Inhaler   5   .  butalbital-acetaminophen-caffeine (FIORICET, ESGIC) 50-325-40 MG tablet   Oral   Take 1 tablet by mouth every 6 (six) hours as needed for headache.   60 tablet   5   . Calcium Carb-Cholecalciferol (CALCIUM CARBONATE-VITAMIN D3) 600-400 MG-UNIT TABS   Oral   Take by mouth.         . cetirizine (ZYRTEC) 10 MG tablet   Oral   Take by mouth.         . clonazePAM (KLONOPIN) 1 MG tablet   Oral   Take 1 mg by mouth 3 (three) times daily.          . clotrimazole (MYCELEX) 10 MG troche      10 mg as needed.           . divalproex (DEPAKOTE ER) 500 MG 24 hr tablet   Oral   Take 500 mg by mouth 3 (three) times daily.          [name] EPIPEN 2-PAK 0.3 MG/0.3ML SOAJ injection                 Dispense as written.   . fluticasone (FLONASE) 50 MCG/ACT nasal spray   Each Nare   Place 1 spray into both nostrils 2 (two) times daily.   16 g   5   . gabapentin (NEURONTIN) 300 MG capsule   Oral   Take 300 mg by mouth 3 (three) times daily.          [name] HYDROcodone-acetaminophen (NORCO/VICODIN) 5-325 MG tablet   Oral   Take 1 tablet by mouth every 4 (four) hours as needed for moderate pain.   20 tablet   0   . hydrOXYzine (ATARAX/VISTARIL) 50 MG tablet   Oral   Take 1 tablet (50 mg total) by mouth at bedtime as needed.   30 tablet   5   . lamoTRIgine (LAMICTAL) 200 MG tablet   Oral   Take 1 tablet by mouth daily.         [name] levocetirizine (XYZAL) 5 MG tablet   Oral   Take 1 tablet (5 mg total) by mouth every evening.   30 tablet   5   . lidocaine (LIDODERM) 5 %   Transdermal   Place 1 patch onto the skin daily. Remove & Discard patch within 12 hours or as directed by MD   30 patch   0   . meloxicam (MOBIC) 7.5 MG tablet   Oral   Take 1 tablet (7.5 mg total) by mouth 2 (two) times daily after a meal.   42 tablet   1   . montelukast (SINGULAIR) 10 MG tablet   Oral   Take 10 mg by mouth at bedtime.         [name] nystatin (MYCOSTATIN) 100000 UNIT/ML suspension   Mouth/Throat   Use as directed 5 mLs (500,000 Units total) in the mouth or throat 4 (four) times daily.   180 mL   0   . ondansetron (ZOFRAN) 4 MG tablet      1 ORAL EVERY SIX HOURS AS NEEDED   45 tablet   3   . pantoprazole (PROTONIX) 40 MG tablet      TAKE 1 TABLET (40 MG TOTAL) BY MOUTH DAILY.   90 tablet   2   . polyethylene glycol powder (GLYCOLAX/MIRALAX) powder               . prazosin (MINIPRESS) 2 MG capsule   Oral   Take  1 capsule by mouth daily.         . promethazine (PHENERGAN) 25  MG tablet      TAKE 1 TABLET BY MOUTH EVERY 6 HOURS AS NEEDED   40 tablet   5   . rOPINIRole (REQUIP) 2 MG tablet   Oral   Take 2 mg by mouth 2 (two) times daily.      4   . SUMAtriptan (IMITREX) 100 MG tablet   Oral   Take 100 mg by mouth every 2 (two) hours as needed for migraine. May repeat in 2 hours if headache persists or recurs.         . VENTOLIN HFA 108 (90 BASE) MCG/ACT inhaler   Inhalation   Inhale 1-2 puffs into the lungs every 6 (six) hours as needed for wheezing or shortness of breath.   8 g   5     Dispense as written.   . zaleplon (SONATA) 10 MG capsule                 Allergies Review of patient's allergies indicates no known allergies.  Family History  Problem Relation Age of Onset  . Heart disease Father   . Alcohol abuse Father   . Diabetes Father   . Heart disease Brother   . Depression Brother   . Stroke Brother   . Alcohol abuse Paternal Grandfather     Social History Social History  Substance Use Topics  . Smoking status: Current Every Day Smoker -- 0.50 packs/day    Types: Cigarettes  . Smokeless tobacco: None  . Alcohol Use: No    Review of Systems Constitutional: No fever/chills Eyes: No visual changes. ENT: No trauma Cardiovascular: Denies chest pain. Respiratory: Denies shortness of breath. Gastrointestinal: No abdominal pain.  No nausea, no vomiting.   Musculoskeletal: Negative for back pain. Positive for right knee pain. Skin: Positive superficial abrasion right knee. Neurological: Negative for headaches, focal weakness or numbness.  10-point ROS otherwise negative.  ____________________________________________   PHYSICAL EXAM:  VITAL SIGNS: ED Triage Vitals  Enc Vitals Group     BP 08/18/15 0826 140/82 mmHg     Pulse Rate 08/18/15 0826 96     Resp 08/18/15 0826 97     Temp 08/18/15 0826 98.2 F (36.8 C)     Temp Source 08/18/15 0826 Oral     SpO2 08/18/15 0826 97 %     Weight 08/18/15 0826 177 lb  (80.287 kg)     Height 08/18/15 0826 5\' 9"  (1.753 m)     Head Cir --      Peak Flow --      Pain Score 08/18/15 0828 9     Pain Loc --      Pain Edu? --      Excl. in GC? --     Constitutional: Alert and oriented. Well appearing and in no acute distress. Eyes: Conjunctivae are normal. PERRL. EOMI. Head: Atraumatic. Nose: No congestion/rhinnorhea. Neck: No stridor.   Cardiovascular: Normal rate, regular rhythm. Grossly normal heart sounds.  Good peripheral circulation. Respiratory: Normal respiratory effort.  No retractions. Lungs CTAB. Gastrointestinal: Soft and nontender. No distention. Musculoskeletal: Moves upper extremities without any difficulty and also range of motion of her back is without restriction. There is marked tenderness on palpation of the anterior right knee. There is no effusion or soft tissue edema. There is a very superficial abrasion noted to the patellar area without active bleeding. Range of motion increases  patient's pain but patient is ambulatory in the emergency room without assistance. Neurologic:  Normal speech and language. No gross focal neurologic deficits are appreciated. No gait instability. Skin:  Skin is warm, dry and intact. Superficial abrasion to right knee anteriorly. Psychiatric: Mood and affect are normal. Speech and behavior are normal.  ____________________________________________   LABS (all labs ordered are listed, but only abnormal results are displayed)  Labs Reviewed - No data to display  RADIOLOGY  X-ray right knee per radiologist is negative for fracture dislocation. ____________________________________________   PROCEDURES  Procedure(s) performed: None  Critical Care performed: No  ____________________________________________   INITIAL IMPRESSION / ASSESSMENT AND PLAN / ED COURSE  Pertinent labs & imaging results that were available during my care of the patient were reviewed by me and considered in my medical decision  making (see chart for details).  Patient is encouraged to use Jones wrap and ice and elevate. She is given a prescription for Norco as needed for severe pain. She is to follow-up with her primary care doctor at [facility] if any continued problems. Patient was ambulatory from the exam room but disgruntled about having to wait to be discharged. ____________________________________________   FINAL CLINICAL IMPRESSION(S) / ED DIAGNOSES  Final diagnoses:  Knee contusion, right, initial encounter      Tommi Rumps, PA-C 08/18/15 1023  Minna Antis, MD 08/18/15 1419

## 2015-08-18 NOTE — ED Notes (Signed)
Right knee pain s/s fall yesterday. Skin across kneecap red, small scrape, painful to touch. No obvious deformity.

## 2015-08-22 ENCOUNTER — Encounter: Payer: Self-pay | Admitting: Family Medicine

## 2015-08-22 ENCOUNTER — Telehealth: Payer: Self-pay

## 2015-08-22 ENCOUNTER — Ambulatory Visit (INDEPENDENT_AMBULATORY_CARE_PROVIDER_SITE_OTHER): Payer: Medicare Other | Admitting: Family Medicine

## 2015-08-22 VITALS — BP 118/78 | HR 105 | Temp 98.5°F | Resp 16 | Ht 69.0 in | Wt 184.0 lb

## 2015-08-22 DIAGNOSIS — M25561 Pain in right knee: Secondary | ICD-10-CM | POA: Diagnosis not present

## 2015-08-22 MED ORDER — HYDROCODONE-ACETAMINOPHEN 5-325 MG PO TABS: 1.0000 | ORAL_TABLET | ORAL | Status: DC | PRN

## 2015-08-22 NOTE — Telephone Encounter (Signed)
PA request sent.

## 2015-08-22 NOTE — Telephone Encounter (Signed)
Dr. Starling Manns needs to approved this lidocain patch for pt. States [facility] has faxed forms over wanting this approval to be completed. Pt states she last seen Dr. Starling Manns on Feb. 8th. Please call patient ASAP

## 2015-08-22 NOTE — Progress Notes (Signed)
Name: Sabrina Espinoza   MRN: [id number]    DOB: May 05, 1966   Date:08/22/2015       Progress Note  Subjective  Chief Complaint  Chief Complaint  Patient presents with  . Fall    Had a fall at [employer] on 08/17/15.  Fell on right knee and arm.  Went to ER on 08/18/15.  Xrays came back normal  . Knee Pain    From fall, swelling, painful and brusied.  . Arm Pain    swelled and brusied.    HPI  Sabrina Espinoza is a 50 year old female who was here for CPE but she wanted to turn her visit into a ER f/u visit. She went to the ER on 08/18/15 after sustaining a fall at [facility] the previous evening. She landed directly on her right knee. She complains of pain along with a superficial abrasion. Patient states is very tender to touch. She tells a story of catching her foot on a metal post while she was putting a case of water in her buggy. She denies any head injury or loss of consciousness. She denies any over-the-counter medication prior to her arrival in the emergency room. Patient has been ambulatory since this accident. She denies any prior knee injury.Currently she rates her pain as a 8/10. Radiographs of the knee at the time were normal.   If you may recall she going to pain management specialist as well for neck pain. She is on Lidoderm 5% patch (patient states insurance has not approved this medication yet), Meloxicam 7.5 mg bid. And the ER gave her oral pain medication which has run out she states.    Past Medical History  Diagnosis Date  . Chronic hepatitis C with hepatic coma (HCC)     Followed by Dr. Servando Snare. Starting [facility] treatment for 12 weeks in 2016  . Hypothyroidism, adult     Working with Dr. Horton Chin, Endocrinology. S/P RAIU and is planning on biopsy  . Nontoxic multinodular goiter     FNA done 10/30/14 with benign results. Seen [name]. Patient may want a second opinion.  . Vitamin D deficiency   . Nicotine dependence, uncomplicated   . Depression with anxiety     Followed  by Dr. Lynett Fish, psychiatrist at [facility] and [facility] for thrapy  . Migraine without status migrainosus, not intractable   . Rhinitis, allergic   . COPD, moderate (HCC)   . Chronic constipation     on Linzess, Miralax, Zofran  . History of chicken pox   . History of cocaine use   . History of pneumonia     Patient Active Problem List   Diagnosis Date Noted  . Midline low back pain without sciatica 05/07/2015  . Elevated blood pressure 04/20/2015  . Polypharmacy 03/22/2015  . Foot pain, right 03/16/2015  . Pain pharynx 03/08/2015  . GERD without esophagitis 03/08/2015  . Abnormal mammogram of right breast 03/07/2015  . Degenerative disc disease, cervical 02/26/2015  . Bilateral leg cramps 02/26/2015  . Skin lesions, generalized 02/26/2015  . Allergic rhinitis with postnasal drip 01/31/2015  . Chronic sinusitis with recurrent bronchitis 01/31/2015  . Oral thrush 01/31/2015  . History of cocaine use 01/16/2015  . 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 Chronic hepatitis C without hepatic coma (HCC) 01/16/2015  . Chronic constipation 01/16/2015  . COPD, moderate (HCC) 01/16/2015  . Cigarette smoker 01/16/2015  . Bipolar 1 disorder, mixed, partial remission (HCC) 01/16/2015  . Migraine without aura  and without status migrainosus, not intractable 01/16/2015  . Depression with anxiety 01/16/2015  . Cephalalgia 03/06/2014  . Imbalance 03/06/2014  . Insomnia due to medical condition 03/06/2014    Social History  Substance Use Topics  . Smoking status: Current Every Day Smoker -- 0.50 packs/day    Types: Cigarettes  . Smokeless tobacco: Not on file  . Alcohol Use: No     Current outpatient prescriptions:  .  budesonide-formoterol (SYMBICORT) 160-4.5 MCG/ACT inhaler, USE 2 PUFFS 2 TIMES A DAY, Disp: 10.2 Inhaler, Rfl: 5 .  butalbital-acetaminophen-caffeine (FIORICET, ESGIC) 50-325-40 MG tablet, Take 1 tablet by mouth every 6 (six) hours as needed  for headache., Disp: 60 tablet, Rfl: 5 .  Calcium Carb-Cholecalciferol (CALCIUM CARBONATE-VITAMIN D3) 600-400 MG-UNIT TABS, Take by mouth., Disp: , Rfl:  .  cetirizine (ZYRTEC) 10 MG tablet, Take by mouth., Disp: , Rfl:  .  clonazePAM (KLONOPIN) 1 MG tablet, Take 1 mg by mouth 3 (three) times daily. , Disp: , Rfl:  .  clotrimazole (MYCELEX) 10 MG troche, 10 mg as needed. , Disp: , Rfl:  .  divalproex (DEPAKOTE ER) 500 MG 24 hr tablet, Take 500 mg by mouth 3 (three) times daily. , Disp: , Rfl:  .  EPIPEN 2-PAK 0.3 MG/0.3ML SOAJ injection, , Disp: , Rfl:  .  fluticasone (FLONASE) 50 MCG/ACT nasal spray, Place 1 spray into both nostrils 2 (two) times daily., Disp: 16 g, Rfl: 5 .  gabapentin (NEURONTIN) 300 MG capsule, Take 300 mg by mouth 3 (three) times daily. , Disp: , Rfl:  .  HYDROcodone-acetaminophen (NORCO/VICODIN) 5-325 MG tablet, Take 1 tablet by mouth every 4 (four) hours as needed for moderate pain., Disp: 20 tablet, Rfl: 0 .  hydrOXYzine (ATARAX/VISTARIL) 50 MG tablet, Take 1 tablet (50 mg total) by mouth at bedtime as needed., Disp: 30 tablet, Rfl: 5 .  lamoTRIgine (LAMICTAL) 200 MG tablet, Take 1 tablet by mouth daily., Disp: , Rfl:  .  levocetirizine (XYZAL) 5 MG tablet, Take 1 tablet (5 mg total) by mouth every evening., Disp: 30 tablet, Rfl: 5 .  lidocaine (LIDODERM) 5 %, Place 1 patch onto the skin daily. Remove & Discard patch within 12 hours or as directed by MD, Disp: 30 patch, Rfl: 0 .  meloxicam (MOBIC) 7.5 MG tablet, Take 1 tablet (7.5 mg total) by mouth 2 (two) times daily after a meal., Disp: 42 tablet, Rfl: 1 .  montelukast (SINGULAIR) 10 MG tablet, Take 10 mg by mouth at bedtime., Disp: , Rfl:  .  nystatin (MYCOSTATIN) 100000 UNIT/ML suspension, Use as directed 5 mLs (500,000 Units total) in the mouth or throat 4 (four) times daily., Disp: 180 mL, Rfl: 0 .  ondansetron (ZOFRAN) 4 MG tablet, 1 ORAL EVERY SIX HOURS AS NEEDED, Disp: 45 tablet, Rfl: 3 .  pantoprazole (PROTONIX)  40 MG tablet, TAKE 1 TABLET (40 MG TOTAL) BY MOUTH DAILY., Disp: 90 tablet, Rfl: 2 .  polyethylene glycol powder (GLYCOLAX/MIRALAX) powder, , Disp: , Rfl:  .  prazosin (MINIPRESS) 2 MG capsule, Take 1 capsule by mouth daily., Disp: , Rfl:  .  promethazine (PHENERGAN) 25 MG tablet, TAKE 1 TABLET BY MOUTH EVERY 6 HOURS AS NEEDED, Disp: 40 tablet, Rfl: 5 .  rOPINIRole (REQUIP) 2 MG tablet, Take 2 mg by mouth 2 (two) times daily., Disp: , Rfl: 4 .  SUMAtriptan (IMITREX) 100 MG tablet, Take 100 mg by mouth every 2 (two) hours as needed for migraine. May repeat in 2 hours if  headache persists or recurs., Disp: , Rfl:  .  VENTOLIN HFA 108 (90 BASE) MCG/ACT inhaler, Inhale 1-2 puffs into the lungs every 6 (six) hours as needed for wheezing or shortness of breath., Disp: 8 g, Rfl: 5 .  zaleplon (SONATA) 10 MG capsule, , Disp: , Rfl:   Past Surgical History  Procedure Laterality Date  . Ectopic pregnancy surgery  2003  . Ankle fracture surgery  1988    rods and pins in place  . Cesarean section  1996    Family History  Problem Relation Age of Onset  . Heart disease Father   . Alcohol abuse Father   . Diabetes Father   . Heart disease Brother   . Depression Brother   . Stroke Brother   . Alcohol abuse Paternal Grandfather     No Known Allergies   Review of Systems  CONSTITUTIONAL: No significant weight changes, fever, chills, weakness or fatigue.   CARDIOVASCULAR: No chest pain, chest pressure or chest discomfort. No palpitations or edema.  RESPIRATORY: No shortness of breath, cough or sputum.  NEUROLOGICAL: No headache, dizziness, syncope, paralysis, ataxia, numbness or tingling in the extremities. No memory changes. No change in bowel or bladder control.  MUSCULOSKELETAL: Yes joint pain. No muscle pain. HEMATOLOGIC: No anemia, bleeding or bruising.  LYMPHATICS: No enlarged lymph nodes.  PSYCHIATRIC: No change in mood. No change in sleep pattern.  ENDOCRINOLOGIC: No reports of  sweating, cold or heat intolerance. No polyuria or polydipsia.     Objective  BP 118/78 mmHg  Pulse 105  Temp(Src) 98.5 F (36.9 C) (Oral)  Resp 16  Ht  (1.753 m)  Wt 184 lb (83.462 kg)  BMI 27.16 kg/m2  SpO2 96%  LMP 07/18/2015 (Approximate) Body mass index is 27.16 kg/(m^2).  Physical Exam  Constitutional: Patient appears well-developed and well-nourished. In no distress.  Cardiovascular: Normal rate, regular rhythm and normal heart sounds.  No murmur heard.  Pulmonary/Chest: Effort normal and breath sounds normal. No respiratory distress. Musculoskeletal: Normal range of motion bilateral UE and LE, no joint effusions. Right knee some healed scrapes, otherwise has full ROM, no effusions, no laxity. She reports pain to palpation over patella.  Peripheral vascular: Bilateral LE no edema. Neurological: CN II-XII grossly intact with no focal deficits. Alert and oriented to person, place, and time. Coordination, balance, strength, speech and gait are normal.  Skin: Skin is warm and dry. No rash noted. No erythema.  Psychiatric: Patient has a stable mood and affect. Behavior is normal in office today. Judgment and thought content normal in office today.   Assessment & Plan   1. Right knee pain Contusion, continue ice prn, home exercises, pain medication to be weaned down to NSAIDs as needed. If ongoing pain call me in 1 week I will reorder x-ray and sent to PT.   - HYDROcodone-acetaminophen (NORCO/VICODIN) 5-325 MG tablet; Take 1 tablet by mouth every 4 (four) hours as needed for moderate pain.  Dispense: 20 tablet; Refill: 0

## 2015-08-23 LAB — TOXASSURE SELECT 13 (MW), URINE: PDF: 0

## 2015-08-24 NOTE — Telephone Encounter (Signed)
error 

## 2015-08-27 NOTE — Telephone Encounter (Signed)
Patient notified Lidoderm patches have been denied by insurance.

## 2015-08-27 NOTE — Telephone Encounter (Signed)
Pt wants to know was there an approval for lidocaine patches. Please call pt with this information. Pt claims it has been two weeks. Pt continues to call me.

## 2015-08-28 ENCOUNTER — Telehealth: Payer: Self-pay | Admitting: Anesthesiology

## 2015-08-28 NOTE — Telephone Encounter (Signed)
PA already done. Lidoderm patches denied.

## 2015-08-28 NOTE — Telephone Encounter (Signed)
meds need prior auth so patient can get meds

## 2015-09-03 ENCOUNTER — Telehealth: Payer: Self-pay | Admitting: Family Medicine

## 2015-09-03 DIAGNOSIS — M25561 Pain in right knee: Secondary | ICD-10-CM

## 2015-09-03 MED ORDER — IBUPROFEN 800 MG PO TABS
800.0000 mg | ORAL_TABLET | Freq: Three times a day (TID) | ORAL | Status: DC | PRN
Start: 2015-09-03 — End: 2015-11-01

## 2015-09-03 NOTE — Telephone Encounter (Signed)
Patient fell on Feb 10th and you had suggested Physical Therapy and patient had declined. Now patient states that he knee is still giving her problems and it really hurt. She would like to proceed with the physical therapy and also requesting a prescription to help relieve the pain. Please return call if you have any questions.

## 2015-09-03 NOTE — Telephone Encounter (Signed)
Referral to PT placed. Ibuprofen 800 mg sent to pharmacy. No further narcotic or controlled substance medication.

## 2015-09-04 ENCOUNTER — Encounter: Payer: Medicare Other | Admitting: Family Medicine

## 2015-09-06 ENCOUNTER — Telehealth: Payer: Self-pay | Admitting: Anesthesiology

## 2015-09-06 ENCOUNTER — Other Ambulatory Visit: Payer: Self-pay

## 2015-09-06 NOTE — Telephone Encounter (Signed)
Called the second number given, message left.

## 2015-09-06 NOTE — Telephone Encounter (Signed)
Attempted to call patient, the number given has been disconnected. Already notified patient on 08-22-15 that Lidoderm was denied.

## 2015-09-06 NOTE — Telephone Encounter (Signed)
Has been unable to get meds filled, needs prior authorization, canceled appt for 09-10-15 because was supposed be on meds and have eval, please call patient and let her know about prior authoriztion asap

## 2015-09-10 ENCOUNTER — Ambulatory Visit: Payer: Medicare Other | Admitting: Anesthesiology

## 2015-09-13 ENCOUNTER — Encounter: Payer: Medicare Other | Admitting: Anesthesiology

## 2015-09-13 ENCOUNTER — Telehealth: Payer: Self-pay

## 2015-09-13 DIAGNOSIS — M25561 Pain in right knee: Secondary | ICD-10-CM

## 2015-09-13 NOTE — Telephone Encounter (Signed)
Patient called staill having trouble with right knee from fall wants to see about getting referral placed for Ortho?

## 2015-09-13 NOTE — Telephone Encounter (Signed)
Referral to Orthopedics placed

## 2015-09-17 ENCOUNTER — Other Ambulatory Visit: Payer: Self-pay | Admitting: Family Medicine

## 2015-09-17 ENCOUNTER — Ambulatory Visit
Admission: RE | Admit: 2015-09-17 | Discharge: 2015-09-17 | Disposition: A | Discharge status: Home / Self Care | Payer: Medicare Other | Source: Ambulatory Visit | Attending: Family Medicine | Admitting: Family Medicine

## 2015-09-17 DIAGNOSIS — N6489 Other specified disorders of breast: Secondary | ICD-10-CM | POA: Diagnosis present

## 2015-09-20 ENCOUNTER — Other Ambulatory Visit: Payer: Self-pay | Admitting: Neurosurgery

## 2015-09-20 DIAGNOSIS — M47892 Other spondylosis, cervical region: Secondary | ICD-10-CM

## 2015-10-02 ENCOUNTER — Other Ambulatory Visit: Payer: Medicare Other

## 2015-10-10 ENCOUNTER — Encounter: Payer: Medicare Other | Admitting: Anesthesiology

## 2015-10-16 ENCOUNTER — Other Ambulatory Visit: Payer: Self-pay

## 2015-10-16 ENCOUNTER — Ambulatory Visit: Payer: Medicare Other | Admitting: Family Medicine

## 2015-10-16 ENCOUNTER — Ambulatory Visit
Admission: RE | Admit: 2015-10-16 | Discharge: 2015-10-16 | Disposition: A | Discharge status: Home / Self Care | Payer: Medicare Other | Source: Ambulatory Visit | Attending: Neurosurgery | Admitting: Neurosurgery

## 2015-10-16 DIAGNOSIS — G43009 Migraine without aura, not intractable, without status migrainosus: Secondary | ICD-10-CM

## 2015-10-16 DIAGNOSIS — M47892 Other spondylosis, cervical region: Secondary | ICD-10-CM

## 2015-10-16 NOTE — Telephone Encounter (Signed)
[place] reviewed; multiple controlled substances and providers; NO controlled substances from me; Rx denied

## 2015-10-24 ENCOUNTER — Ambulatory Visit: Payer: Medicare Other | Admitting: Family Medicine

## 2015-11-01 ENCOUNTER — Other Ambulatory Visit: Payer: Self-pay

## 2015-11-01 DIAGNOSIS — M25561 Pain in right knee: Secondary | ICD-10-CM

## 2015-11-01 NOTE — Telephone Encounter (Signed)
Med list reviewed She has two NSAIDs, so I removed ibuprofen She has multiple antihistamines; will have staff review those at f/u

## 2015-11-02 ENCOUNTER — Encounter: Payer: Self-pay | Admitting: Family Medicine

## 2015-11-02 ENCOUNTER — Ambulatory Visit
Admission: RE | Admit: 2015-11-02 | Discharge: 2015-11-02 | Disposition: A | Discharge status: Home / Self Care | Payer: Medicare Other | Source: Ambulatory Visit | Attending: Family Medicine | Admitting: Family Medicine

## 2015-11-02 ENCOUNTER — Ambulatory Visit (INDEPENDENT_AMBULATORY_CARE_PROVIDER_SITE_OTHER): Payer: Medicare Other | Admitting: Family Medicine

## 2015-11-02 VITALS — BP 120/82 | HR 90 | Temp 98.8°F | Resp 14 | Ht 69.0 in | Wt 180.0 lb

## 2015-11-02 DIAGNOSIS — R0982 Postnasal drip: Secondary | ICD-10-CM | POA: Diagnosis not present

## 2015-11-02 DIAGNOSIS — G43009 Migraine without aura, not intractable, without status migrainosus: Secondary | ICD-10-CM | POA: Diagnosis not present

## 2015-11-02 DIAGNOSIS — M25521 Pain in right elbow: Secondary | ICD-10-CM | POA: Insufficient documentation

## 2015-11-02 DIAGNOSIS — J45909 Unspecified asthma, uncomplicated: Secondary | ICD-10-CM

## 2015-11-02 DIAGNOSIS — G4700 Insomnia, unspecified: Secondary | ICD-10-CM | POA: Diagnosis not present

## 2015-11-02 DIAGNOSIS — J449 Chronic obstructive pulmonary disease, unspecified: Secondary | ICD-10-CM

## 2015-11-02 DIAGNOSIS — Z720 Tobacco use: Secondary | ICD-10-CM | POA: Diagnosis not present

## 2015-11-02 DIAGNOSIS — B182 Chronic viral hepatitis C: Secondary | ICD-10-CM

## 2015-11-02 DIAGNOSIS — F17210 Nicotine dependence, cigarettes, uncomplicated: Secondary | ICD-10-CM

## 2015-11-02 DIAGNOSIS — F5104 Psychophysiologic insomnia: Secondary | ICD-10-CM

## 2015-11-02 DIAGNOSIS — J309 Allergic rhinitis, unspecified: Secondary | ICD-10-CM

## 2015-11-02 HISTORY — DX: Psychophysiologic insomnia: F51.04

## 2015-11-02 MED ORDER — LEVOCETIRIZINE DIHYDROCHLORIDE 5 MG PO TABS: 5.0000 mg | ORAL_TABLET | Freq: Every evening | ORAL | Status: DC

## 2015-11-02 MED ORDER — BUDESONIDE-FORMOTEROL FUMARATE 160-4.5 MCG/ACT IN AERO: 2.0000 | INHALATION_SPRAY | Freq: Two times a day (BID) | RESPIRATORY_TRACT | Status: AC

## 2015-11-02 MED ORDER — MONTELUKAST SODIUM 10 MG PO TABS: 10.0000 mg | ORAL_TABLET | Freq: Every day | ORAL | Status: DC

## 2015-11-02 MED ORDER — IBUPROFEN 800 MG PO TABS: 800.0000 mg | ORAL_TABLET | Freq: Three times a day (TID) | ORAL | Status: DC | PRN

## 2015-11-02 NOTE — Assessment & Plan Note (Addendum)
Using rescue inhaler on a daily basis; refer to pulmonologist; encoruaged patient to quit smoking

## 2015-11-02 NOTE — Assessment & Plan Note (Addendum)
Patient asking for fioricet for daily headaches; with her other multiple medications, I am not comfortable prescribing her fioricet; I will be glad to refer her to a neurologist for evaluation and treatment of her headaches; she may ben having analgesic-associated headaches

## 2015-11-02 NOTE — Patient Instructions (Addendum)
I've put in a referral for you to see the pulmonologist for your COPD and chronic insomnia, and the neurologist about your headaches  You can have the xray today for the elbow  I do encourage you to quit smoking Call [phone] to sign up for smoking cessation classes You can call 1-800-QUIT-NOW to talk with a smoking cessation coach   Smoking Cessation, Tips for Success If you are ready to quit smoking, congratulations! You have chosen to help yourself be healthier. Cigarettes bring nicotine, tar, carbon monoxide, and other irritants into your body. Your lungs, heart, and blood vessels will be able to work better without these poisons. There are many different ways to quit smoking. Nicotine gum, nicotine patches, a nicotine inhaler, or nicotine nasal spray can help with physical craving. Hypnosis, support groups, and medicines help break the habit of smoking. WHAT THINGS CAN I DO TO MAKE QUITTING EASIER?  Here are some tips to help you quit for good:  Pick a date when you will quit smoking completely. Tell all of your friends and family about your plan to quit on that date.  Do not try to slowly cut down on the number of cigarettes you are smoking. Pick a quit date and quit smoking completely starting on that day.  Throw away all cigarettes.   Clean and remove all ashtrays from your home, work, and car.  On a card, write down your reasons for quitting. Carry the card with you and read it when you get the urge to smoke.  Cleanse your body of nicotine. Drink enough water and fluids to keep your urine clear or pale yellow. Do this after quitting to flush the nicotine from your body.  Learn to predict your moods. Do not let a bad situation be your excuse to have a cigarette. Some situations in your life might tempt you into wanting a cigarette.  Never have "just one" cigarette. It leads to wanting another and another. Remind yourself of your decision to quit.  Change habits associated  with smoking. If you smoked while driving or when feeling stressed, try other activities to replace smoking. Stand up when drinking your coffee. Brush your teeth after eating. Sit in a different chair when you read the paper. Avoid alcohol while trying to quit, and try to drink fewer caffeinated beverages. Alcohol and caffeine may urge you to smoke.  Avoid foods and drinks that can trigger a desire to smoke, such as sugary or spicy foods and alcohol.  Ask people who smoke not to smoke around you.  Have something planned to do right after eating or having a cup of coffee. For example, plan to take a walk or exercise.  Try a relaxation exercise to calm you down and decrease your stress. Remember, you may be tense and nervous for the first 2 weeks after you quit, but this will pass.  Find new activities to keep your hands busy. Play with a pen, coin, or rubber band. Doodle or draw things on paper.  Brush your teeth right after eating. This will help cut down on the craving for the taste of tobacco after meals. You can also try mouthwash.   Use oral substitutes in place of cigarettes. Try using lemon drops, carrots, cinnamon sticks, or chewing gum. Keep them handy so they are available when you have the urge to smoke.  When you have the urge to smoke, try deep breathing.  Designate your home as a nonsmoking area.  If you are a  heavy smoker, ask your health care provider about a prescription for nicotine chewing gum. It can ease your withdrawal from nicotine.  Reward yourself. Set aside the cigarette money you save and buy yourself something nice.  Look for support from others. Join a support group or smoking cessation program. Ask someone at home or at work to help you with your plan to quit smoking.  Always ask yourself, "Do I need this cigarette or is this just a reflex?" Tell yourself, "Today, I choose not to smoke," or "I do not want to smoke." You are reminding yourself of your decision  to quit.  Do not replace cigarette smoking with electronic cigarettes (commonly called e-cigarettes). The safety of e-cigarettes is unknown, and some may contain harmful chemicals.  If you relapse, do not give up! Plan ahead and think about what you will do the next time you get the urge to smoke. HOW WILL I FEEL WHEN I QUIT SMOKING? You may have symptoms of withdrawal because your body is used to nicotine (the addictive substance in cigarettes). You may crave cigarettes, be irritable, feel very hungry, cough often, get headaches, or have difficulty concentrating. The withdrawal symptoms are only temporary. They are strongest when you first quit but will go away within 10-14 days. When withdrawal symptoms occur, stay in control. Think about your reasons for quitting. Remind yourself that these are signs that your body is healing and getting used to being without cigarettes. Remember that withdrawal symptoms are easier to treat than the major diseases that smoking can cause.  Even after the withdrawal is over, expect periodic urges to smoke. However, these cravings are generally short lived and will go away whether you smoke or not. Do not smoke! WHAT RESOURCES ARE AVAILABLE TO HELP ME QUIT SMOKING? Your health care provider can direct you to community resources or hospitals for support, which may include:  Group support.  Education.  Hypnosis.  Therapy.   This information is not intended to replace advice given to you by your health care provider. Make sure you discuss any questions you have with your health care provider.   Document Released: 03/21/2004 Document Revised: 07/14/2014 Document Reviewed: 12/09/2012 Elsevier Interactive Patient Education [employer]2016 [employer] Inc.
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
## 2015-11-04 ENCOUNTER — Encounter: Payer: Self-pay | Admitting: Family Medicine

## 2015-11-04 NOTE — Assessment & Plan Note (Signed)
Encouraged patient to quit smoking; I am here to help if / when she is ready

## 2015-11-04 NOTE — Assessment & Plan Note (Signed)
I will be glad to refer patient to a sleep specialist for evaluation of her chronic insomnia; I do not plan to prescribe her any sedative hypnotics

## 2015-11-04 NOTE — Assessment & Plan Note (Signed)
Patient reports that she was treated and has been cured

## 2015-11-18 ENCOUNTER — Encounter: Payer: Self-pay | Admitting: Emergency Medicine

## 2015-11-18 ENCOUNTER — Emergency Department
Admission: EM | Admit: 2015-11-18 | Discharge: 2015-11-18 | Disposition: A | Discharge status: Home / Self Care | Payer: Medicare Other | Attending: Emergency Medicine | Admitting: Emergency Medicine

## 2015-11-18 DIAGNOSIS — J449 Chronic obstructive pulmonary disease, unspecified: Secondary | ICD-10-CM | POA: Diagnosis not present

## 2015-11-18 DIAGNOSIS — E039 Hypothyroidism, unspecified: Secondary | ICD-10-CM | POA: Insufficient documentation

## 2015-11-18 DIAGNOSIS — Z79899 Other long term (current) drug therapy: Secondary | ICD-10-CM | POA: Diagnosis not present

## 2015-11-18 DIAGNOSIS — F17210 Nicotine dependence, cigarettes, uncomplicated: Secondary | ICD-10-CM | POA: Diagnosis not present

## 2015-11-18 DIAGNOSIS — G8918 Other acute postprocedural pain: Secondary | ICD-10-CM | POA: Diagnosis not present

## 2015-11-18 DIAGNOSIS — F329 Major depressive disorder, single episode, unspecified: Secondary | ICD-10-CM | POA: Insufficient documentation

## 2015-11-18 MED ORDER — DEXAMETHASONE SODIUM PHOSPHATE 10 MG/ML IJ SOLN
10.0000 mg | Freq: Once | INTRAMUSCULAR | Status: AC
  Administered 2015-11-18: 10 mg via INTRAMUSCULAR
  Filled 2015-11-18: qty 1

## 2015-11-18 MED ORDER — HYDROMORPHONE HCL 1 MG/ML IJ SOLN
1.0000 mg | Freq: Once | INTRAMUSCULAR | Status: AC
  Administered 2015-11-18: 1 mg via INTRAMUSCULAR
  Filled 2015-11-18: qty 1

## 2015-11-18 NOTE — ED Notes (Signed)
Pt presents to ED c/o cervical operation complications. Pt had surgery for disc and titanium plates; since then pt has pain that radiates from neck to shoulder. Pain 10/10.

## 2015-11-18 NOTE — ED Provider Notes (Addendum)
[facility] Emergency Department Provider Note  ____________________________________________   I have reviewed the triage vital signs and the nursing notes.   HISTORY  Chief Complaint Post-op Problem    HPI Sabrina Espinoza is a 50 y.o. female with a history of chronic pain issues, remote history of drug abuse, chronic insomnia, COPD, chronic constipation, chronic migraines, presents today complaining of pain after procedure. Patient had an anterior approach cervical fusion operation [facility] an [facility] by Dr. Annett FabianKyleCabbell on Friday. He sent her home with Flexeril and 5/325 narcotic pain medications however this was insufficient to control her pain she states. She called Dr.ditty yesterday, who is covering, and was informed she could take 2 of those medications as needed, it is not clear if she did so. The patient states her pain is simply not controlled. She denies any fever chills nausea vomiting she is able to swallow without difficulty infections requesting a drink at this moment. She has had no numbness or weakness in relates with a normal gait. Her only issue is that she has insufficient narcotic pain medication after the procedure. She does have a slight hoarse voice which she was informed could happen temporarily. No difficulty breathing however.      Past Medical History  Diagnosis Date  . Chronic hepatitis C with hepatic coma (HCC)     Followed by Dr. Servando SnareWohl. [facility]  . Hypothyroidism, adult     Working with Dr. Horton ChinShamil [name], Endocrinology. S/P RAIU and is planning on biopsy  . Nontoxic multinodular goiter     FNA done 10/30/14 with benign results. Seen [name]. Patient may want a second opinion.  . Vitamin D deficiency   . Nicotine dependence, uncomplicated   . Depression with anxiety     Followed by Dr. [name], [facility] and  [name] for thrapy  . Migraine without status migrainosus, not intractable   . Rhinitis, allergic   . COPD, moderate (HCC)   . Chronic constipation     on Linzess, Miralax, Zofran  . History of chicken pox   . History of cocaine use   . History of pneumonia   . Chronic insomnia 11/02/2015    Patient Active Problem List   Diagnosis Date Noted  . Chronic insomnia 11/02/2015  . Right knee pain 08/22/2015  . Midline low back pain without sciatica 05/07/2015  . Elevated blood pressure 04/20/2015  . Polypharmacy 03/22/2015  . Foot pain, right 03/16/2015  . Pain pharynx 03/08/2015  . GERD without esophagitis 03/08/2015  . Abnormal mammogram of right breast 03/07/2015  . Degenerative disc disease, cervical 02/26/2015  . Bilateral leg cramps 02/26/2015  . Skin lesions, generalized 02/26/2015  . Allergic rhinitis with postnasal drip 01/31/2015  . Chronic sinusitis with recurrent bronchitis 01/31/2015  . Oral thrush 01/31/2015  . History of cocaine use 01/16/2015  . 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 Chronic hepatitis C without hepatic coma (HCC) 01/16/2015  . Chronic constipation 01/16/2015  . COPD, moderate (HCC) 01/16/2015  . Cigarette smoker 01/16/2015  . Bipolar 1 disorder, mixed, partial remission (HCC) 01/16/2015  . Migraine without aura and without status migrainosus, not intractable 01/16/2015  . Depression with anxiety 01/16/2015  . Cephalalgia 03/06/2014  . Imbalance 03/06/2014  . Insomnia due to medical condition 03/06/2014    Past Surgical History  Procedure Laterality Date  . Ectopic pregnancy surgery  2003  . Ankle  fracture surgery  1988    rods and pins in place  . Cesarean section  1996    Current Outpatient Rx  Name  Route  Sig  Dispense  Refill  . azelastine (ASTELIN) 0.1 % nasal spray   Nasal   Place into the nose 2 (two) times daily. Use in each nostril as directed         . budesonide-formoterol (SYMBICORT) 160-4.5 MCG/ACT inhaler    Inhalation   Inhale 2 puffs into the lungs 2 (two) times daily. USE 2 PUFFS 2 TIMES A DAY   10.2 Inhaler   2   . Calcium Carb-Cholecalciferol (CALCIUM CARBONATE-VITAMIN D3) 600-400 MG-UNIT TABS   Oral   Take by mouth.         . clonazePAM (KLONOPIN) 1 MG tablet   Oral   Take 1 mg by mouth 3 (three) times daily.          [name] EPIPEN 2-PAK 0.3 MG/0.3ML SOAJ injection                 Dispense as written.   . fluticasone (FLONASE) 50 MCG/ACT nasal spray   Each Nare   Place 1 spray into both nostrils 2 (two) times daily.   16 g   5   . gabapentin (NEURONTIN) 300 MG capsule   Oral   Take 300 mg by mouth 3 (three) times daily.          [name] ibuprofen (ADVIL,MOTRIN) 800 MG tablet   Oral   Take 1 tablet (800 mg total) by mouth every 8 (eight) hours as needed. Take with food; do n ot take additional NSAIDs   30 tablet   0   . lamoTRIgine (LAMICTAL) 200 MG tablet   Oral   Take 1 tablet by mouth daily.         [name] levocetirizine (XYZAL) 5 MG tablet   Oral   Take 1 tablet (5 mg total) by mouth every evening.   30 tablet   2   . montelukast (SINGULAIR) 10 MG tablet   Oral   Take 1 tablet (10 mg total) by mouth at bedtime.   30 tablet   2   . ondansetron (ZOFRAN) 4 MG tablet      1 ORAL EVERY SIX HOURS AS NEEDED   45 tablet   3   . pantoprazole (PROTONIX) 40 MG tablet      TAKE 1 TABLET (40 MG TOTAL) BY MOUTH DAILY.   90 tablet   2   . polyethylene glycol powder (GLYCOLAX/MIRALAX) powder               . prazosin (MINIPRESS) 2 MG capsule   Oral   Take 1 capsule by mouth daily.         . promethazine (PHENERGAN) 25 MG tablet      TAKE 1 TABLET BY MOUTH EVERY 6 HOURS AS NEEDED   40 tablet   5   . rOPINIRole (REQUIP) 2 MG tablet   Oral   Take 2 mg by mouth 2 (two) times daily.      4   . SUMAtriptan (IMITREX) 100 MG tablet   Oral   Take 100 mg by mouth every 2 (two) hours as needed for migraine. May repeat in 2 hours if headache persists or  recurs.         . Suvorexant (BELSOMRA) 15 MG TABS   Oral   Take by mouth.         [name]  VENTOLIN HFA 108 (90 BASE) MCG/ACT inhaler   Inhalation   Inhale 1-2 puffs into the lungs every 6 (six) hours as needed for wheezing or shortness of breath.   8 g   5     Dispense as written.   . zaleplon (SONATA) 10 MG capsule                 Allergies Review of patient's allergies indicates no known allergies.  Family History  Problem Relation Age of Onset  . Heart disease Father   . Alcohol abuse Father   . Diabetes Father   . Heart disease Brother   . Depression Brother   . Stroke Brother   . Alcohol abuse Paternal Grandfather   . Breast cancer Neg Hx     Social History Social History  Substance Use Topics  . Smoking status: Current Every Day Smoker -- 0.50 packs/day    Types: Cigarettes  . Smokeless tobacco: None  . Alcohol Use: No    Review of Systems Constitutional: No fever/chills Eyes: No visual changes. ENT: No sore throat.  Respiratory: Denies shortness of breath. Gastrointestinal:   no vomiting.  No diarrhea.  No constipation. Genitourinary: Negative for dysuria. Musculoskeletal: Negative lower extremity swelling Skin: Negative for rash. Neurological: Negative for headaches, focal weakness or numbness. 10-point ROS otherwise negative.  ____________________________________________   PHYSICAL EXAM:  VITAL SIGNS: ED Triage Vitals  Enc Vitals Group     BP 11/18/15 0906 125/79 mmHg     Pulse Rate 11/18/15 0906 102     Resp 11/18/15 0906 20     Temp 11/18/15 0906 98.1 F (36.7 C)     Temp Source 11/18/15 0906 Oral     SpO2 11/18/15 0906 99 %     Weight 11/18/15 0906 180 lb (81.647 kg)     Height 11/18/15 0906  (1.753 m)     Head Cir --      Peak Flow --      Pain Score 11/18/15 0905 10     Pain Loc --      Pain Edu? --      Excl. in GC? --     Constitutional: Alert and oriented. Well appearing and in no acute distress., Very slightly  hoarse voice noted. He wouldn't drink without difficulty Eyes: Conjunctivae are normal. PERRL. EOMI. Head: Atraumatic. Nose: No congestion/rhinnorhea. Mouth/Throat: Mucous membranes are moist.  Oropharynx non-erythematous. Neck: No stridor.   There is no anterior neck swelling of any significance, there is no posterior neck pain or tenderness to palpation, patient has somewhat limited range of motion but can move the neck Cardiovascular: Normal rate, regular rhythm. Grossly normal heart sounds.  Good peripheral circulation. Respiratory: Normal respiratory effort.  No retractions. Lungs CTAB. Abdominal: Soft and nontender. No distention. No guarding no rebound Back:  There is no focal tenderness or step off there is no midline tenderness there are no lesions noted. there is no CVA tenderness Musculoskeletal: No lower extremity tenderness. No joint effusions, no DVT signs strong distal pulses no edema Neurologic:  Normal speech and language. No gross focal neurologic deficits are appreciated.  Skin:  Surgical site to the left anterior neck is clean dry and intact with no evidence of bleeding infection mass swelling or induration. No fluctuance. Well approximated. Psychiatric: Mood and affect are normal. Speech and behavior are normal.  ____________________________________________   LABS (all labs ordered are listed, but only abnormal results are displayed)  Labs Reviewed - No data  to display ____________________________________________  EKG  I personally interpreted any EKGs ordered by me or triage  ____________________________________________  RADIOLOGY  I reviewed any imaging ordered by me or triage that were performed during my shift and, if possible, patient and/or family made aware of any abnormal findings. ____________________________________________   PROCEDURES  Procedure(s) performed: None  Critical Care performed:  None  ____________________________________________   INITIAL IMPRESSION / ASSESSMENT AND PLAN / ED COURSE  Pertinent labs & imaging results that were available during my care of the patient were reviewed by me and considered in my medical decision making (see chart for details).  Patient here for pain control, she has no other complaint, surgical site looks good. She doesn't a slightly hoarse voice and I discussed with Dr.ditty about this. He feels this is a normal variant. Patient has no difficulty swallowing and in fact his drinking in the room at this time. She has nonfocal and her neurologic exam, there is no evidence of infection to the surgical site there is no evidence of neurological complication there is no evidence of airway issue there is no evidence of difficulty swallowing or dehydration. Her  surgeons as asked me to give her Decadron and pain medication here. Patient was given pain medication here and feels much better. I did advise her that she can take double Percocet every once in a while to see if that helps her pain. There is extensive return precautions reviewed with the patient and she understands the need for close outpatient follow-up on Monday, tomorrow, and the need to return for any new or worrisome symptoms in the meantime. ____________________________________________   FINAL CLINICAL IMPRESSION(S) / ED DIAGNOSES  Final diagnoses:  None      This chart was dictated using voice recognition software.  Despite best efforts to proofread,  errors can occur which can change meaning.     Jeanmarie Plant, MD 11/18/15 1610  Jeanmarie Plant, MD 11/18/15 [phone]

## 2015-11-18 NOTE — Discharge Instructions (Signed)
If you have trouble breathing, increased pain, shortness of breath, difficulty swallowing that is different from what you have had since his surgery, swelling or redness to the wound site, fever, numbness or weakness, or you feel worse in any way return to the emergency department. Do not drink or drive while taking pain medication. It is okay to take a double dose of the narcotic pain medication for breakthrough pain every 8 hours.

## 2015-11-20 DIAGNOSIS — G8918 Other acute postprocedural pain: Secondary | ICD-10-CM | POA: Diagnosis not present

## 2015-11-20 DIAGNOSIS — E039 Hypothyroidism, unspecified: Secondary | ICD-10-CM | POA: Insufficient documentation

## 2015-11-20 DIAGNOSIS — J449 Chronic obstructive pulmonary disease, unspecified: Secondary | ICD-10-CM | POA: Diagnosis not present

## 2015-11-20 DIAGNOSIS — F17210 Nicotine dependence, cigarettes, uncomplicated: Secondary | ICD-10-CM | POA: Diagnosis not present

## 2015-11-20 DIAGNOSIS — Z760 Encounter for issue of repeat prescription: Secondary | ICD-10-CM | POA: Diagnosis present

## 2015-11-20 DIAGNOSIS — Z79899 Other long term (current) drug therapy: Secondary | ICD-10-CM | POA: Insufficient documentation

## 2015-11-20 DIAGNOSIS — F329 Major depressive disorder, single episode, unspecified: Secondary | ICD-10-CM | POA: Insufficient documentation

## 2015-11-20 NOTE — ED Notes (Addendum)
Pt in with co neck pain had spinal surgery on Friday incision site noted to anterior neck.  Pt was here Sunday for different pain meds states needed stronger meds.  Pt was prescribed 40 vicodin tabs Friday and states has already finished them.   States we did not give her a prescription and has not been able to reach the surgeon.  Pt seems to be slurring her words in triage, states has taken "night medicines".

## 2015-11-21 ENCOUNTER — Emergency Department
Admission: EM | Admit: 2015-11-21 | Discharge: 2015-11-21 | Disposition: A | Discharge status: Home / Self Care | Payer: Medicare Other | Attending: Emergency Medicine | Admitting: Emergency Medicine

## 2015-11-21 MED ORDER — OXYCODONE-ACETAMINOPHEN 5-325 MG PO TABS
ORAL_TABLET | ORAL | Status: AC
  Filled 2015-11-21: qty 2

## 2015-11-21 NOTE — ED Notes (Signed)
Patient discharged from ED at 02:21 am. See paper charting for assessment and patient care.

## 2015-11-27 ENCOUNTER — Institutional Professional Consult (permissible substitution): Payer: Medicare Other | Admitting: Internal Medicine

## 2015-11-28 NOTE — ED Provider Notes (Signed)
[facility] Emergency Department Provider Note  ____________________________________________  Time seen: 1:00 AM  I have reviewed the triage vital signs and the nursing notes.   HISTORY  Chief Complaint Medication Refill      HPI Sabrina Espinoza is a 50 y.o. female with history of recent cervical spinal surgery performed by Dr. Mikal Plane presents requesting "stronger pain medicine" secondary to continued discomfort. Patient states that she was prescribed 40 Vicodin's on Friday however that she has already taken all of them. Patient states that she was referred to the emergency department by her surgeon's office for pain control    Past Medical History  Diagnosis Date  . Chronic hepatitis C with hepatic coma (HCC)     Followed by Dr. Servando Snare. Starting [facility] treatment for 12 weeks in 2016  . Hypothyroidism, adult     Working with Dr. Horton Chin, Endocrinology. S/P RAIU and is planning on biopsy  . Nontoxic multinodular goiter     FNA done 10/30/14 with benign results. Seen [name]. Patient may want a second opinion.  . Vitamin D deficiency   . Nicotine dependence, uncomplicated   . Depression with anxiety     Followed by Dr. Lynett Fish, psychiatrist at [facility] and Wilma Flavin for thrapy  . Migraine without status migrainosus, not intractable   . Rhinitis, allergic   . COPD, moderate (HCC)   . Chronic constipation     on Linzess, Miralax, Zofran  . History of chicken pox   . History of cocaine use   . History of pneumonia   . Chronic insomnia 11/02/2015    Patient Active Problem List   Diagnosis Date Noted  . Chronic insomnia 11/02/2015  . Right knee pain 08/22/2015  . Midline low back pain without sciatica 05/07/2015  . Elevated blood pressure 04/20/2015  . Polypharmacy 03/22/2015  . Foot pain, right 03/16/2015  . Pain pharynx 03/08/2015  . GERD without esophagitis 03/08/2015  . Abnormal mammogram of right breast  03/07/2015  . Degenerative disc disease, cervical 02/26/2015  . Bilateral leg cramps 02/26/2015  . Skin lesions, generalized 02/26/2015  . Allergic rhinitis with postnasal drip 01/31/2015  . Chronic sinusitis with recurrent bronchitis 01/31/2015  . Oral thrush 01/31/2015  . History of cocaine use 01/16/2015  . 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 Chronic hepatitis C without hepatic coma (HCC) 01/16/2015  . Chronic constipation 01/16/2015  . COPD, moderate (HCC) 01/16/2015  . Cigarette smoker 01/16/2015  . Bipolar 1 disorder, mixed, partial remission (HCC) 01/16/2015  . Migraine without aura and without status migrainosus, not intractable 01/16/2015  . Depression with anxiety 01/16/2015  . Cephalalgia 03/06/2014  . Imbalance 03/06/2014  . Insomnia due to medical condition 03/06/2014    Past Surgical History  Procedure Laterality Date  . Ectopic pregnancy surgery  2003  . Ankle fracture surgery  1988    rods and pins in place  . Cesarean section  1996    Current Outpatient Rx  Name  Route  Sig  Dispense  Refill  . azelastine (ASTELIN) 0.1 % nasal spray   Nasal   Place into the nose 2 (two) times daily. Use in each nostril as directed         . budesonide-formoterol (SYMBICORT) 160-4.5 MCG/ACT inhaler   Inhalation   Inhale 2 puffs into the lungs 2 (two) times daily. USE 2 PUFFS 2 TIMES A DAY   10.2 Inhaler   2   . Calcium Carb-Cholecalciferol (CALCIUM CARBONATE-VITAMIN D3) 600-400  MG-UNIT TABS   Oral   Take by mouth.         . clonazePAM (KLONOPIN) 1 MG tablet   Oral   Take 1 mg by mouth 3 (three) times daily.          [name] EPIPEN 2-PAK 0.3 MG/0.3ML SOAJ injection                 Dispense as written.   . fluticasone (FLONASE) 50 MCG/ACT nasal spray   Each Nare   Place 1 spray into both nostrils 2 (two) times daily.   16 g   5   . gabapentin (NEURONTIN) 300 MG capsule   Oral   Take 300 mg by mouth 3 (three) times daily.          [name] ibuprofen  (ADVIL,MOTRIN) 800 MG tablet   Oral   Take 1 tablet (800 mg total) by mouth every 8 (eight) hours as needed. Take with food; do n ot take additional NSAIDs   30 tablet   0   . lamoTRIgine (LAMICTAL) 200 MG tablet   Oral   Take 1 tablet by mouth daily.         [name] levocetirizine (XYZAL) 5 MG tablet   Oral   Take 1 tablet (5 mg total) by mouth every evening.   30 tablet   2   . montelukast (SINGULAIR) 10 MG tablet   Oral   Take 1 tablet (10 mg total) by mouth at bedtime.   30 tablet   2   . ondansetron (ZOFRAN) 4 MG tablet      1 ORAL EVERY SIX HOURS AS NEEDED   45 tablet   3   . pantoprazole (PROTONIX) 40 MG tablet      TAKE 1 TABLET (40 MG TOTAL) BY MOUTH DAILY.   90 tablet   2   . polyethylene glycol powder (GLYCOLAX/MIRALAX) powder               . prazosin (MINIPRESS) 2 MG capsule   Oral   Take 1 capsule by mouth daily.         . promethazine (PHENERGAN) 25 MG tablet      TAKE 1 TABLET BY MOUTH EVERY 6 HOURS AS NEEDED   40 tablet   5   . rOPINIRole (REQUIP) 2 MG tablet   Oral   Take 2 mg by mouth 2 (two) times daily.      4   . SUMAtriptan (IMITREX) 100 MG tablet   Oral   Take 100 mg by mouth every 2 (two) hours as needed for migraine. May repeat in 2 hours if headache persists or recurs.         . Suvorexant (BELSOMRA) 15 MG TABS   Oral   Take by mouth.         . VENTOLIN HFA 108 (90 BASE) MCG/ACT inhaler   Inhalation   Inhale 1-2 puffs into the lungs every 6 (six) hours as needed for wheezing or shortness of breath.   8 g   5     Dispense as written.   . zaleplon (SONATA) 10 MG capsule                 Allergies No known drug allergies  Family History  Problem Relation Age of Onset  . Heart disease Father   . Alcohol abuse Father   . Diabetes Father   . Heart disease Brother   . Depression Brother   .  Stroke Brother   . Alcohol abuse Paternal Grandfather   . Breast cancer Neg Hx     Social History Social History   Substance Use Topics  . Smoking status: Current Every Day Smoker -- 0.50 packs/day    Types: Cigarettes  . Smokeless tobacco: Not on file  . Alcohol Use: No    Review of Systems  Constitutional: Negative for fever. Eyes: Negative for visual changes. ENT: Negative for sore throat. Positive for neck pain Cardiovascular: Negative for chest pain. Respiratory: Negative for shortness of breath. Gastrointestinal: Negative for abdominal pain, vomiting and diarrhea. Genitourinary: Negative for dysuria. Musculoskeletal: Negative for back pain. Skin: Negative for rash. Neurological: Negative for headaches, focal weakness or numbness.   10-point ROS otherwise negative.  ____________________________________________   PHYSICAL EXAM:  VITAL SIGNS: ED Triage Vitals  Enc Vitals Group     BP 11/20/15 2302 131/94 mmHg     Pulse Rate 11/20/15 2302 105     Resp 11/20/15 2302 18     Temp 11/20/15 2302 98.2 F (36.8 C)     Temp Source 11/20/15 2302 Oral     SpO2 11/20/15 2302 99 %     Weight 11/20/15 2302 181 lb (82.101 kg)     Height 11/20/15 2302 5\' 9"  (1.753 m)     Head Cir --      Peak Flow --      Pain Score 11/20/15 2302 10     Pain Loc --      Pain Edu? --      Excl. in GC? --     Constitutional: Alert and oriented. Well appearing and in no distress. Eyes: Conjunctivae are normal. PERRL. Normal extraocular movements. ENT   Head: Normocephalic and atraumatic.   Nose: No congestion/rhinnorhea.   Mouth/Throat: Mucous membranes are moist.   Neck: No stridor. Hematological/Lymphatic/Immunilogical: No cervical lymphadenopathy. Cardiovascular: Normal rate, regular rhythm. Normal and symmetric distal pulses are present in all extremities. No murmurs, rubs, or gallops. Respiratory: Normal respiratory effort without tachypnea nor retractions. Breath sounds are clear and equal bilaterally. No wheezes/rales/rhonchi. Gastrointestinal: Soft and nontender. No distention.  There is no CVA tenderness. Genitourinary: deferred Musculoskeletal: Nontender with normal range of motion in all extremities. No joint effusions.  No lower extremity tenderness nor edema. Neurologic:  Normal speech and language. No gross focal neurologic deficits are appreciated. Speech is normal.  Skin:  Skin is warm, dry and intact. No rash noted. Psychiatric: Mood and affect are normal. Speech and behavior are normal. Patient exhibits appropriate insight and judgment.    INITIAL IMPRESSION / ASSESSMENT AND PLAN / ED COURSE  Pertinent labs & imaging results that were available during my care of the patient were reviewed by me and considered in my medical decision making (see chart for details).  Patient received Percocet in the emergency department and referred to her neurosurgeon for further outpatient pain control  ____________________________________________   FINAL CLINICAL IMPRESSION(S) / ED DIAGNOSES  Final diagnoses:  None  Postoperative pain    Darci Currentandolph N Kamarri Lovvorn, MD 11/28/15 0700

## 2015-12-11 ENCOUNTER — Other Ambulatory Visit: Payer: Self-pay

## 2015-12-11 DIAGNOSIS — J309 Allergic rhinitis, unspecified: Secondary | ICD-10-CM

## 2015-12-11 MED ORDER — FLUTICASONE PROPIONATE 50 MCG/ACT NA SUSP: 1.0000 | Freq: Two times a day (BID) | NASAL | Status: AC

## 2015-12-12 ENCOUNTER — Institutional Professional Consult (permissible substitution): Payer: Medicare Other | Admitting: Internal Medicine

## 2015-12-14 ENCOUNTER — Telehealth: Payer: Self-pay | Admitting: Family Medicine

## 2015-12-14 NOTE — Telephone Encounter (Signed)
Per the request of Dr. Baruch GoutyMelinda Lada, I contacted this patient to strongly encourage her to go to the nearest urgent care for her sx since she was unable to be properly accessed and evaluated to determine her exact diagnosed . She then stated that her mom told her that we should be able to call something in for her and I informed her that we can only treat an illness if it was evaluated during an office visit. I stated that she was more than welcome to try to get in next week but that if her sx were persistent that she should go to a walk- in clinic. She then said that she did not have transportation so she could not, but thanks for calling.

## 2015-12-14 NOTE — Telephone Encounter (Signed)
Pt would like something called in for UTI.

## 2015-12-14 NOTE — Telephone Encounter (Signed)
Please ask her to go to urgent care; thank you 

## 2015-12-21 ENCOUNTER — Other Ambulatory Visit: Payer: Self-pay

## 2015-12-21 MED ORDER — IBUPROFEN 800 MG PO TABS: 800.0000 mg | ORAL_TABLET | Freq: Three times a day (TID) | ORAL | Status: DC | PRN

## 2016-01-10 ENCOUNTER — Encounter: Payer: Medicare Other | Admitting: Internal Medicine

## 2016-01-10 NOTE — Progress Notes (Signed)
[facility]      Assessment and Plan:  Insomnia.  Excessive daytime sleepiness.  Nicotine abuse.  Chronic bronchitis.   Date: 01/10/2016  MRN# [id number] Alfonse Rasudrey E Geffert [birth year]  Referring Physician:   Alfonse Rasudrey E Weir is a 50 y.o. old female seen in consultation for chief complaint of:   No chief complaint on file.   HPI:   The patient is a 50 year old female smoker with chronic bronchitis. She is referred for issues with chronic insomnia going on many years. She sees a psychiatrist and is been tried on multiple medications in the past, she has never had a sleep study.   PMHX:   Past Medical History  Diagnosis Date  . Chronic hepatitis C with hepatic coma (HCC)     Followed by Dr. Servando [facility]Wohl. [facility]  . Hypothyroidism, adult     Working with Dr. Horton ChinShamil [name], Endocrinology. S/P RAIU and is planning on biopsy  . Nontoxic multinodular goiter     FNA done 10/30/14 with benign results. Seen [name]. Patient may want a second opinion.  . Vitamin D deficiency   . Nicotine dependence, uncomplicated   . Depression with anxiety     Follow[facility] [name][facility] and [name] for thrapy  . Migraine without status migrainosus, not intractable   . Rhinitis, allergic   . COPD, moderate (HCC)   . Chronic constipation     on Linzess, Miralax, Zofran  . History of chicken pox   . History of cocaine use   . History of pneumonia   . Chronic insomnia 11/02/2015   Surgical Hx:  Past Surgical History  Procedure Laterality Date  . Ectopic pregnancy surgery  2003  . Ankle fracture surgery  1988    rods and pins in place  . Cesarean section  1996   Family Hx:  Family History  Problem Relation Age of Onset  . Heart disease Father   . Alcohol abuse Father   . Diabetes Father   . Heart disease Brother   . Depression Brother   . Stroke Brother   . Alcohol abuse  Paternal Grandfather   . Breast cancer Neg Hx    Social Hx:   Social History  Substance Use Topics  . Smoking status: Current Every Day Smoker -- 0.50 packs/day    Types: Cigarettes  . Smokeless tobacco: Not on file  . Alcohol Use: No   Medication:   Current Outpatient Rx  Name  Route  Sig  Dispense  Refill  . azelastine (ASTELIN) 0.1 % nasal spray   Nasal   Place into the nose 2 (two) times daily. Use in each nostril as directed         . budesonide-formoterol (SYMBICORT) 160-4.5 MCG/ACT inhaler   Inhalation   Inhale 2 puffs into the lungs 2 (two) times daily. USE 2 PUFFS 2 TIMES A DAY   10.2 Inhaler   2   . Calcium Carb-Cholecalciferol (CALCIUM CARBONATE-VITAMIN D3) 600-400 MG-UNIT TABS   Oral   Take by mouth.         . clonazePAM (KLONOPIN) 1 MG tablet   Oral   Take 1 mg by mouth 3 (three) times daily.          [name]. EPIPEN 2-PAK 0.3 MG/0.3ML SOAJ injection                 Dispense as written.   . fluticasone (  FLONASE) 50 MCG/ACT nasal spray   Each Nare   Place 1 spray into both nostrils 2 (two) times daily.   16 g   11   . gabapentin (NEURONTIN) 300 MG capsule   Oral   Take 300 mg by mouth 3 (three) times daily.          [name]. ibuprofen (ADVIL,MOTRIN) 800 MG tablet   Oral   Take 1 tablet (800 mg total) by mouth every 8 (eight) hours as needed. Take with food; do not take additional NSAIDs   30 tablet   0   . lamoTRIgine (LAMICTAL) 200 MG tablet   Oral   Take 1 tablet by mouth daily.         [name]. levocetirizine (XYZAL) 5 MG tablet   Oral   Take 1 tablet (5 mg total) by mouth every evening.   30 tablet   2   . montelukast (SINGULAIR) 10 MG tablet   Oral   Take 1 tablet (10 mg total) by mouth at bedtime.   30 tablet   2   . ondansetron (ZOFRAN) 4 MG tablet      1 ORAL EVERY SIX HOURS AS NEEDED   45 tablet   3   . pantoprazole (PROTONIX) 40 MG tablet      TAKE 1 TABLET (40 MG TOTAL) BY MOUTH DAILY.   90 tablet   2   . polyethylene  glycol powder (GLYCOLAX/MIRALAX) powder               . prazosin (MINIPRESS) 2 MG capsule   Oral   Take 1 capsule by mouth daily.         . promethazine (PHENERGAN) 25 MG tablet      TAKE 1 TABLET BY MOUTH EVERY 6 HOURS AS NEEDED   40 tablet   5   . rOPINIRole (REQUIP) 2 MG tablet   Oral   Take 2 mg by mouth 2 (two) times daily.      4   . SUMAtriptan (IMITREX) 100 MG tablet   Oral   Take 100 mg by mouth every 2 (two) hours as needed for migraine. May repeat in 2 hours if headache persists or recurs.         . Suvorexant (BELSOMRA) 15 MG TABS   Oral   Take by mouth.         . VENTOLIN HFA 108 (90 BASE) MCG/ACT inhaler   Inhalation   Inhale 1-2 puffs into the lungs every 6 (six) hours as needed for wheezing or shortness of breath.   8 g   5     Dispense as written.   . zaleplon (SONATA) 10 MG capsule                   Allergies:  Review of patient's allergies indicates no known allergies.  Review of Systems: Gen:  Denies  fever, sweats, chills HEENT: Denies blurred vision, double vision. bleeds, sore throat Cvc:  No dizziness, chest pain. Resp:   Denies cough or sputum production, shortness of breath Gi: Denies swallowing difficulty, stomach pain. Gu:  Denies bladder incontinence, burning urine Ext:   No Joint pain, stiffness. Skin: No skin rash,  hives  Endoc:  No polyuria, polydipsia. Psych: No depression, insomnia. Other:  All other systems were reviewed with the patient and were negative other that what is mentioned in the HPI.   Physical Examination:   VS: There were no vitals taken for this visit.  General Appearance: No distress  Neuro:without focal findings,  speech normal,  HEENT: PERRLA, EOM intact.   Pulmonary: normal breath sounds, No wheezing.  CardiovascularNormal S1,S2.  No m/r/g.   Abdomen: Benign, Soft, non-tender. Renal:  No costovertebral tenderness  GU:  No performed at this time. Endoc: No evident thyromegaly, no  signs of acromegaly. Skin:   warm, no rashes, no ecchymosis  Extremities: normal, no cyanosis, clubbing.  Other findings:    LABORATORY PANEL:   CBC No results for input(s): WBC, HGB, HCT, PLT in the last 168 hours. ------------------------------------------------------------------------------------------------------------------  Chemistries  No results for input(s): NA, K, CL, CO2, GLUCOSE, BUN, CREATININE, CALCIUM, MG, AST, ALT, ALKPHOS, BILITOT in the last 168 hours.  Invalid input(s): GFRCGP ------------------------------------------------------------------------------------------------------------------  Cardiac Enzymes No results for input(s): TROPONINI in the last 168 hours. ------------------------------------------------------------  RADIOLOGY:  No results found.     Thank  you for the consultation and for allowing [facility] to assist in the care of your patient. Our recommendations are noted above.  Please contact us if we can be of further service.   Wells Guiles, MD.  Board Certified in Internal Medicine, Pulmonary Medicine, Critical Care Medicine, and Sleep Medicine.  [facility] Office Number: [phone]  Santiago Glad, M.D.  Stephanie Acre, M.D.  Billy Fischer, M.D  01/10/2016 This encounter was created in error - please disregard.

## 2016-01-15 ENCOUNTER — Telehealth: Payer: Self-pay | Admitting: Family Medicine

## 2016-01-15 DIAGNOSIS — M5030 Other cervical disc degeneration, unspecified cervical region: Secondary | ICD-10-CM

## 2016-01-15 DIAGNOSIS — M5412 Radiculopathy, cervical region: Secondary | ICD-10-CM | POA: Insufficient documentation

## 2016-01-15 NOTE — Telephone Encounter (Signed)
PT SAID THAT SHE HAS SEEN DR CABBELL ( NECK SURGEON 2 MONTHS AGO) AND SHE WANTS SOMEONE ELSE BECAUSE SHE CAN NOT MOVE HER ARMS , NECK  AND SHOULDER ALL ON HER RIGHT SIDE. HE HAS DONE MORE HARM THAN GOOD AND SHE IS IN SEVERE PAIN AND SHE CALLS HIM AND HE IS DOING NOTHING TO HELP HER, SHE CAN NOT SLEEP AND SHE USES A HEATING PAD TAKING TYLENOL, BC POWDERS AND SHE NEEDS TO KNOW WHAT SHE CAN DO TO GET A REFERRAL TO SEE SOMEONE THAT CAN HELP HER. SURGERY WAS ON MAY 12. SHE IS NOT HAPPY AT ALL WITH THAT DR OR HER OUT COME.

## 2016-01-15 NOTE — Assessment & Plan Note (Signed)
Referral to another surgeon per patient request

## 2016-01-15 NOTE — Telephone Encounter (Signed)
I have ordered referral and MRI C-spine If severe pain, go to urgent care or ER If loss of control of bowel or bladder, go to ER immediately

## 2016-01-18 ENCOUNTER — Telehealth: Payer: Self-pay | Admitting: Family Medicine

## 2016-01-18 MED ORDER — IBUPROFEN 800 MG PO TABS: 800.0000 mg | ORAL_TABLET | Freq: Three times a day (TID) | ORAL | Status: DC | PRN

## 2016-01-18 NOTE — Telephone Encounter (Signed)
done

## 2016-01-18 NOTE — Telephone Encounter (Signed)
Requesting refill on Ibuprofen 800mg . Please send to [facility] [address]

## 2016-01-19 ENCOUNTER — Emergency Department
Admission: EM | Admit: 2016-01-19 | Discharge: 2016-01-19 | Disposition: A | Discharge status: Home / Self Care | Payer: Medicare Other | Attending: Emergency Medicine | Admitting: Emergency Medicine

## 2016-01-19 DIAGNOSIS — X500XXA Overexertion from strenuous movement or load, initial encounter: Secondary | ICD-10-CM | POA: Insufficient documentation

## 2016-01-19 DIAGNOSIS — F418 Other specified anxiety disorders: Secondary | ICD-10-CM | POA: Diagnosis not present

## 2016-01-19 DIAGNOSIS — F17210 Nicotine dependence, cigarettes, uncomplicated: Secondary | ICD-10-CM | POA: Diagnosis not present

## 2016-01-19 DIAGNOSIS — Z79899 Other long term (current) drug therapy: Secondary | ICD-10-CM | POA: Diagnosis not present

## 2016-01-19 DIAGNOSIS — S161XXA Strain of muscle, fascia and tendon at neck level, initial encounter: Secondary | ICD-10-CM | POA: Insufficient documentation

## 2016-01-19 DIAGNOSIS — J449 Chronic obstructive pulmonary disease, unspecified: Secondary | ICD-10-CM | POA: Diagnosis not present

## 2016-01-19 DIAGNOSIS — Y929 Unspecified place or not applicable: Secondary | ICD-10-CM | POA: Diagnosis not present

## 2016-01-19 DIAGNOSIS — Z791 Long term (current) use of non-steroidal anti-inflammatories (NSAID): Secondary | ICD-10-CM | POA: Insufficient documentation

## 2016-01-19 DIAGNOSIS — Y9389 Activity, other specified: Secondary | ICD-10-CM | POA: Insufficient documentation

## 2016-01-19 DIAGNOSIS — Y999 Unspecified external cause status: Secondary | ICD-10-CM | POA: Diagnosis not present

## 2016-01-19 DIAGNOSIS — F3177 Bipolar disorder, in partial remission, most recent episode mixed: Secondary | ICD-10-CM | POA: Diagnosis not present

## 2016-01-19 DIAGNOSIS — M5080 Other cervical disc disorders, unspecified cervical region: Secondary | ICD-10-CM | POA: Insufficient documentation

## 2016-01-19 DIAGNOSIS — Z7951 Long term (current) use of inhaled steroids: Secondary | ICD-10-CM | POA: Insufficient documentation

## 2016-01-19 DIAGNOSIS — S199XXA Unspecified injury of neck, initial encounter: Secondary | ICD-10-CM | POA: Diagnosis present

## 2016-01-19 DIAGNOSIS — S46911A Strain of unspecified muscle, fascia and tendon at shoulder and upper arm level, right arm, initial encounter: Secondary | ICD-10-CM | POA: Diagnosis not present

## 2016-01-19 DIAGNOSIS — E039 Hypothyroidism, unspecified: Secondary | ICD-10-CM | POA: Diagnosis not present

## 2016-01-19 MED ORDER — DIAZEPAM 2 MG PO TABS
2.0000 mg | ORAL_TABLET | Freq: Once | ORAL | Status: AC
  Administered 2016-01-19: 2 mg via ORAL
  Filled 2016-01-19: qty 1

## 2016-01-19 MED ORDER — OXYCODONE-ACETAMINOPHEN 5-325 MG PO TABS
1.0000 | ORAL_TABLET | ORAL | Status: DC | PRN
Start: 2016-01-19 — End: 2016-04-03

## 2016-01-19 MED ORDER — HYDROMORPHONE HCL 1 MG/ML IJ SOLN
1.0000 mg | Freq: Once | INTRAMUSCULAR | Status: AC
Start: 2016-01-19 — End: 2016-01-19
  Administered 2016-01-19: 1 mg via INTRAMUSCULAR
  Filled 2016-01-19: qty 1

## 2016-01-19 NOTE — ED Notes (Addendum)
See triage note. Pt states she injured R shoulder pulling on lawn mower. Neck surgery 2 mos. ago, plate placed to C5-6 per pt. 10/10 pain to R shoulder, neck, and pt states her arm is tingling. Full ROM in room. Pt states, "And you had better not bring me no Percocet. This pain is BAD!" Pt tearful during assessment. Pt states a friend brought her in and will pick her up.

## 2016-01-19 NOTE — Discharge Instructions (Signed)
Cervical Sprain °A cervical sprain is when the tissues (ligaments) that hold the neck bones in place stretch or tear. °HOME CARE  °· Put ice on the injured area. °· Put ice in a plastic bag. °· Place a towel between your skin and the bag. °· Leave the ice on for 15-20 minutes, 3-4 times a day. °· You may have been given a collar to wear. This collar keeps your neck from moving while you heal. °· Do not take the collar off unless told by your doctor. °· If you have long hair, keep it outside of the collar. °· Ask your doctor before changing the position of your collar. You may need to change its position over time to make it more comfortable. °· If you are allowed to take off the collar for cleaning or bathing, follow your doctor's instructions on how to do it safely. °· Keep your collar clean by wiping it with mild soap and water. Dry it completely. If the collar has removable pads, remove them every 1-2 days to hand wash them with soap and water. Allow them to air dry. They should be dry before you wear them in the collar. °· Do not drive while wearing the collar. °· Only take medicine as told by your doctor. °· Keep all doctor visits as told. °· Keep all physical therapy visits as told. °· Adjust your work station so that you have good posture while you work. °· Avoid positions and activities that make your problems worse. °· Warm up and stretch before being active. °GET HELP IF: °· Your pain is not controlled with medicine. °· You cannot take less pain medicine over time as planned. °· Your activity level does not improve as expected. °GET HELP RIGHT AWAY IF:  °· You are bleeding. °· Your stomach is upset. °· You have an allergic reaction to your medicine. °· You develop new problems that you cannot explain. °· You lose feeling (become numb) or you cannot move any part of your body (paralysis). °· You have tingling or weakness in any part of your body. °· Your symptoms get worse. Symptoms include: °· Pain,  soreness, stiffness, puffiness (swelling), or a burning feeling in your neck. °· Pain when your neck is touched. °· Shoulder or upper back pain. °· Limited ability to move your neck. °· Headache. °· Dizziness. °· Your hands or arms feel week, lose feeling, or tingle. °· Muscle spasms. °· Difficulty swallowing or chewing. °MAKE SURE YOU:  °· Understand these instructions. °· Will watch your condition. °· Will get help right away if you are not doing well or get worse. °  °This information is not intended to replace advice given to you by your health care provider. Make sure you discuss any questions you have with your health care provider. °  °Document Released: 12/10/2007 Document Revised: 02/23/2013 Document Reviewed: 12/29/2012 °Elsevier Interactive Patient Education ©2016 Elsevier Inc. ° °Cryotherapy °Cryotherapy is when you put ice on your injury. Ice helps lessen pain and puffiness (swelling) after an injury. Ice works the best when you start using it in the first 24 to 48 hours after an injury. °HOME CARE °· Put a dry or damp towel between the ice pack and your skin. °· You may press gently on the ice pack. °· Leave the ice on for no more than 10 to 20 minutes at a time. °· Check your skin after 5 minutes to make sure your skin is okay. °· Rest at least 20   minutes between ice pack uses.  Stop using ice when your skin loses feeling (numbness).  Do not use ice on someone who cannot tell you when it hurts. This includes small children and people with memory problems (dementia). GET HELP RIGHT AWAY IF:  You have white spots on your skin.  Your skin turns blue or pale.  Your skin feels waxy or hard.  Your puffiness gets worse. MAKE SURE YOU:   Understand these instructions.  Will watch your condition.  Will get help right away if you are not doing well or get worse.   This information is not intended to replace advice given to you by your health care provider. Make sure you discuss any  questions you have with your health care provider.   Document Released: 12/10/2007 Document Revised: 09/15/2011 Document Reviewed: 02/13/2011 Elsevier Interactive Patient Education [employer] Elsevier Inc.    Keep your doctor's appointment on Monday and discuss your pain issues with him and further pain medication. Percocet 1 every 4-6 hours as needed for pain.

## 2016-01-19 NOTE — ED Notes (Signed)
Pt reports recent neck surgery 2 months ago; reports trying to mow the lawn today and "yanking" on the lawnmower "10 times". Pt reports pain to neck and right shoulder, reports she seems her MD on Monday.

## 2016-01-19 NOTE — ED Provider Notes (Signed)
[facility] Emergency Department Provider Note  ____________________________________________  Time seen: Approximately 11:55 AM  I have reviewed the triage vital signs and the nursing notes.   HISTORY  Chief Complaint Neck Pain and Shoulder Pain   HPI Sabrina Espinoza is a 50 y.o. female here complaining of neck and right shoulder pain after pulling a lawnmower. Patient states that she had neck surgery 2 months ago and a plate was placed at C5-C6. Patient states that since pulling on the lumbar to starting it she has experienced increased pain and some tingling in her right arm. She states that she took Percocet at home without any relief and now is completely out of Percocet. Patient states that she did not drive and is requesting pain medication. "You can bring May 15 Percocet"because the pain is that bad. Currently she rates her pain as a 10 over 10. She states that she has an appointment with her surgeon on Monday, July 17.       Past Medical History  Diagnosis Date  . Chronic hepatitis C with hepatic coma (HCC)     Followed by Dr. Servando Snare. Starting [facility] treatment for 12 weeks in 2016  . Hypothyroidism, adult     Working with Dr. Horton Chin, Endocrinology. S/P RAIU and is planning on biopsy  . Nontoxic multinodular goiter     FNA done 10/30/14 with benign results. Seen [name]. Patient may want a second opinion.  . Vitamin D deficiency   . Nicotine dependence, uncomplicated   . Depression with anxiety     Followed by Dr. Lynett Fish, psychiatrist at [facility] and Wilma Flavin for thrapy  . Migraine without status migrainosus, not intractable   . Rhinitis, allergic   . COPD, moderate (HCC)   . Chronic constipation     on Linzess, Miralax, Zofran  . History of chicken pox   . History of cocaine use   . History of pneumonia   . Chronic insomnia 11/02/2015    Patient Active Problem List   Diagnosis Date Noted  . Right  cervical radiculopathy 01/15/2016  . Chronic insomnia 11/02/2015  . Right knee pain 08/22/2015  . Midline low back pain without sciatica 05/07/2015  . Elevated blood pressure 04/20/2015  . Polypharmacy 03/22/2015  . Foot pain, right 03/16/2015  . Pain pharynx 03/08/2015  . GERD without esophagitis 03/08/2015  . Abnormal mammogram of right breast 03/07/2015  . Degenerative disc disease, cervical 02/26/2015  . Bilateral leg cramps 02/26/2015  . Skin lesions, generalized 02/26/2015  . Allergic rhinitis with postnasal drip 01/31/2015  . Chronic sinusitis with recurrent bronchitis 01/31/2015  . Oral thrush 01/31/2015  . History of cocaine use 01/16/2015  . 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 Chronic hepatitis C without hepatic coma (HCC) 01/16/2015  . Chronic constipation 01/16/2015  . COPD, moderate (HCC) 01/16/2015  . Cigarette smoker 01/16/2015  . Bipolar 1 disorder, mixed, partial remission (HCC) 01/16/2015  . Migraine without aura and without status migrainosus, not intractable 01/16/2015  . Depression with anxiety 01/16/2015  . Cephalalgia 03/06/2014  . Imbalance 03/06/2014  . Insomnia due to medical condition 03/06/2014    Past Surgical History  Procedure Laterality Date  . Ectopic pregnancy surgery  2003  . Ankle fracture surgery  1988    rods and pins in place  . Cesarean section  1996    Current Outpatient Rx  Name  Route  Sig  Dispense  Refill  . azelastine (ASTELIN) 0.1 % nasal  spray   Nasal   Place into the nose 2 (two) times daily. Use in each nostril as directed         . budesonide-formoterol (SYMBICORT) 160-4.5 MCG/ACT inhaler   Inhalation   Inhale 2 puffs into the lungs 2 (two) times daily. USE 2 PUFFS 2 TIMES A DAY   10.2 Inhaler   2   . Calcium Carb-Cholecalciferol (CALCIUM CARBONATE-VITAMIN D3) 600-400 MG-UNIT TABS   Oral   Take by mouth.         . clonazePAM (KLONOPIN) 1 MG tablet   Oral   Take 1 mg by mouth 3 (three) times daily.           [name] EPIPEN 2-PAK 0.3 MG/0.3ML SOAJ injection                 Dispense as written.   . fluticasone (FLONASE) 50 MCG/ACT nasal spray   Each Nare   Place 1 spray into both nostrils 2 (two) times daily.   16 g   11   . gabapentin (NEURONTIN) 300 MG capsule   Oral   Take 300 mg by mouth 3 (three) times daily.          [name] ibuprofen (ADVIL,MOTRIN) 800 MG tablet   Oral   Take 1 tablet (800 mg total) by mouth every 8 (eight) hours as needed. Take with food; do not take additional NSAIDs   30 tablet   0   . lamoTRIgine (LAMICTAL) 200 MG tablet   Oral   Take 1 tablet by mouth daily.         [name] levocetirizine (XYZAL) 5 MG tablet   Oral   Take 1 tablet (5 mg total) by mouth every evening.   30 tablet   2   . montelukast (SINGULAIR) 10 MG tablet   Oral   Take 1 tablet (10 mg total) by mouth at bedtime.   30 tablet   2   . ondansetron (ZOFRAN) 4 MG tablet      1 ORAL EVERY SIX HOURS AS NEEDED   45 tablet   3   . oxyCODONE-acetaminophen (PERCOCET) 5-325 MG tablet   Oral   Take 1 tablet by mouth every 4 (four) hours as needed for severe pain.   13 tablet   0   . pantoprazole (PROTONIX) 40 MG tablet      TAKE 1 TABLET (40 MG TOTAL) BY MOUTH DAILY.   90 tablet   2   . polyethylene glycol powder (GLYCOLAX/MIRALAX) powder               . prazosin (MINIPRESS) 2 MG capsule   Oral   Take 1 capsule by mouth daily.         . promethazine (PHENERGAN) 25 MG tablet      TAKE 1 TABLET BY MOUTH EVERY 6 HOURS AS NEEDED   40 tablet   5   . rOPINIRole (REQUIP) 2 MG tablet   Oral   Take 2 mg by mouth 2 (two) times daily.      4   . SUMAtriptan (IMITREX) 100 MG tablet   Oral   Take 100 mg by mouth every 2 (two) hours as needed for migraine. May repeat in 2 hours if headache persists or recurs.         . Suvorexant (BELSOMRA) 15 MG TABS   Oral   Take by mouth.         . VENTOLIN HFA 108 (90 BASE) MCG/ACT  inhaler   Inhalation   Inhale 1-2 puffs into  the lungs every 6 (six) hours as needed for wheezing or shortness of breath.   8 g   5     Dispense as written.   . zaleplon (SONATA) 10 MG capsule                 Allergies Review of patient's allergies indicates no known allergies.  Family History  Problem Relation Age of Onset  . Heart disease Father   . Alcohol abuse Father   . Diabetes Father   . Heart disease Brother   . Depression Brother   . Stroke Brother   . Alcohol abuse Paternal Grandfather   . Breast cancer Neg Hx     Social History Social History  Substance Use Topics  . Smoking status: Current Every Day Smoker -- 0.50 packs/day    Types: Cigarettes  . Smokeless tobacco: Not on file  . Alcohol Use: No    Review of Systems Constitutional: No fever/chills Eyes: No visual changes. ENT: No sore throat. Cardiovascular: Denies chest pain. Respiratory: Denies shortness of breath. Gastrointestinal: No abdominal pain.  No nausea, no vomiting.   Musculoskeletal: Negative for back pain.Positive neck pain and positive right shoulder pain. Skin: Negative for rash. Neurological: Negative for headaches, focal weakness. Positive right upper extremity paresthesias  10-point ROS otherwise negative.  ____________________________________________   PHYSICAL EXAM:  VITAL SIGNS: ED Triage Vitals  Enc Vitals Group     BP 01/19/16 1139 190/98 mmHg     Pulse Rate 01/19/16 1139 115     Resp 01/19/16 1139 24     Temp 01/19/16 1139 98.4 F (36.9 C)     Temp Source 01/19/16 1139 Oral     SpO2 01/19/16 1139 99 %     Weight 01/19/16 1139 180 lb (81.647 kg)     Height 01/19/16 1139 5\' 9"  (1.753 m)     Head Cir --      Peak Flow --      Pain Score 01/19/16 1139 10     Pain Loc --      Pain Edu? --      Excl. in GC? --     Constitutional: Alert and oriented. Well appearing and in no acute distress. Eyes: Conjunctivae are normal. PERRL. EOMI. Head: Atraumatic. Nose: No congestion/rhinnorhea. Neck: No stridor.   Generalized tenderness on palpation of cervical spine posteriorly. There is tenderness on the cervical bilateral muscles to the right and trapezius muscles. Cardiovascular: Normal rate, regular rhythm. Grossly normal heart sounds.  Good peripheral circulation. Respiratory: Normal respiratory effort.  No retractions. Lungs CTAB. Gastrointestinal: Soft and nontender. No distention.No CVA tenderness. Musculoskeletal: Examination of the right shoulder there is no gross deformity. There is moderate tenderness on palpation of the trapezius and rhomboid muscle. No soft tissue swelling was noted. Range of motion is decreased secondary to patient's discomfort but no active muscle spasms were seen. Pulse ox was positive bilaterally and motor sensory function intact. Neurologic:  Normal speech and language. No gross focal neurologic deficits are appreciated. No gait instability. Skin:  Skin is warm, dry and intact. No rash noted. No ecchymosis, abrasions or erythema was noted. Psychiatric: Mood and affect are normal. Speech and behavior are normal.  ____________________________________________   LABS (all labs ordered are listed, but only abnormal results are displayed)  Labs Reviewed - No data to display   PROCEDURES  Procedure(s) performed: None  Procedures  Critical Care performed: No  ____________________________________________   INITIAL IMPRESSION / ASSESSMENT AND PLAN / ED COURSE  Pertinent labs & imaging results that were available during my care of the patient were reviewed by me and considered in my medical decision making (see chart for details).  Patient initially was screaming secondary to her pain. Patient got instant relief within one second of this giving Dilaudid injection IM. Patient was also given Valium 2 mg by mouth for muscle spasms. Patient remained in the emergency room without any further complaints until her ride was available to take her home. Patient is to keep her  appointment with her surgeon on Monday, July 17. Patient was given a prescription for Percocet No. 13 to take until she can see her urgent. ____________________________________________   FINAL CLINICAL IMPRESSION(S) / ED DIAGNOSES  Final diagnoses:  Cervical strain, acute, initial encounter  Right shoulder strain, initial encounter      NEW MEDICATIONS STARTED DURING THIS VISIT:  Discharge Medication List as of 01/19/2016 12:42 PM    START taking these medications   Details  oxyCODONE-acetaminophen (PERCOCET) 5-325 MG tablet Take 1 tablet by mouth every 4 (four) hours as needed for severe pain., Starting 01/19/2016, Until Discontinued, Print         Note:  This document was prepared using Dragon voice recognition software and may include unintentional dictation errors.    Tommi Rumps, PA-C 01/19/16 1613  Nita Sickle, MD 01/19/16 2021

## 2016-01-19 NOTE — ED Notes (Signed)

## 2016-01-19 NOTE — ED Notes (Signed)
Pt advised she must wait for her friend to come to ED to pick her up before she can leave d/t IM medication admininistration. Pt given phone to call friend. States friend is on her way.

## 2016-01-23 ENCOUNTER — Other Ambulatory Visit: Payer: Self-pay | Admitting: Family Medicine

## 2016-01-23 ENCOUNTER — Ambulatory Visit: Payer: Medicare Other | Admitting: Family Medicine

## 2016-01-29 ENCOUNTER — Ambulatory Visit
Admission: RE | Admit: 2016-01-29 | Discharge: 2016-01-29 | Disposition: A | Discharge status: Home / Self Care | Payer: Medicare Other | Source: Ambulatory Visit | Attending: Family Medicine | Admitting: Family Medicine

## 2016-01-29 DIAGNOSIS — Z981 Arthrodesis status: Secondary | ICD-10-CM | POA: Diagnosis not present

## 2016-01-29 DIAGNOSIS — M5030 Other cervical disc degeneration, unspecified cervical region: Secondary | ICD-10-CM | POA: Diagnosis present

## 2016-01-29 DIAGNOSIS — M5031 Other cervical disc degeneration,  high cervical region: Secondary | ICD-10-CM | POA: Insufficient documentation

## 2016-01-29 DIAGNOSIS — M5412 Radiculopathy, cervical region: Secondary | ICD-10-CM | POA: Diagnosis not present

## 2016-01-29 DIAGNOSIS — M5021 Other cervical disc displacement,  high cervical region: Secondary | ICD-10-CM | POA: Diagnosis not present

## 2016-01-30 ENCOUNTER — Emergency Department: Payer: Medicare Other

## 2016-01-30 ENCOUNTER — Encounter: Payer: Self-pay | Admitting: Emergency Medicine

## 2016-01-30 ENCOUNTER — Emergency Department
Admission: EM | Admit: 2016-01-30 | Discharge: 2016-01-30 | Disposition: A | Discharge status: Home / Self Care | Payer: Medicare Other | Attending: Emergency Medicine | Admitting: Emergency Medicine

## 2016-01-30 DIAGNOSIS — R079 Chest pain, unspecified: Secondary | ICD-10-CM

## 2016-01-30 DIAGNOSIS — R0789 Other chest pain: Secondary | ICD-10-CM | POA: Insufficient documentation

## 2016-01-30 DIAGNOSIS — Z8719 Personal history of other diseases of the digestive system: Secondary | ICD-10-CM | POA: Insufficient documentation

## 2016-01-30 DIAGNOSIS — J449 Chronic obstructive pulmonary disease, unspecified: Secondary | ICD-10-CM | POA: Insufficient documentation

## 2016-01-30 DIAGNOSIS — F17210 Nicotine dependence, cigarettes, uncomplicated: Secondary | ICD-10-CM | POA: Diagnosis not present

## 2016-01-30 DIAGNOSIS — E039 Hypothyroidism, unspecified: Secondary | ICD-10-CM | POA: Insufficient documentation

## 2016-01-30 DIAGNOSIS — Z7951 Long term (current) use of inhaled steroids: Secondary | ICD-10-CM | POA: Insufficient documentation

## 2016-01-30 DIAGNOSIS — Z79899 Other long term (current) drug therapy: Secondary | ICD-10-CM | POA: Insufficient documentation

## 2016-01-30 LAB — URINALYSIS COMPLETE WITH MICROSCOPIC (ARMC ONLY)
BACTERIA UA: NONE SEEN
Bilirubin Urine: NEGATIVE
Glucose, UA: NEGATIVE mg/dL
Hgb urine dipstick: NEGATIVE
Ketones, ur: NEGATIVE mg/dL
NITRITE: NEGATIVE
PH: 7 (ref 5.0–8.0)
PROTEIN: NEGATIVE mg/dL
Specific Gravity, Urine: 1.003 — ABNORMAL LOW (ref 1.005–1.030)

## 2016-01-30 LAB — BASIC METABOLIC PANEL
Anion gap: 8 (ref 5–15)
BUN: 8 mg/dL (ref 6–20)
CALCIUM: 9.4 mg/dL (ref 8.9–10.3)
CHLORIDE: 104 mmol/L (ref 101–111)
CO2: 26 mmol/L (ref 22–32)
CREATININE: 0.85 mg/dL (ref 0.44–1.00)
GFR calc non Af Amer: >60 mL/min (ref >60)
Glucose, Bld: 100 mg/dL — ABNORMAL HIGH (ref 65–99)
Potassium: 3.9 mmol/L (ref 3.5–5.1)
SODIUM: 138 mmol/L (ref 135–145)

## 2016-01-30 LAB — CBC
HCT: 39.1 % (ref 35.0–47.0)
Hemoglobin: 13.1 g/dL (ref 12.0–16.0)
MCH: 29.2 pg (ref 26.0–34.0)
MCHC: 33.5 g/dL (ref 32.0–36.0)
MCV: 87.3 fL (ref 80.0–100.0)
PLATELETS: 514 10*3/uL — AB (ref 150–440)
RBC: 4.48 MIL/uL (ref 3.80–5.20)
RDW: 15.2 % — AB (ref 11.5–14.5)
WBC: 14.3 10*3/uL — AB (ref 3.6–11.0)

## 2016-01-30 LAB — TROPONIN I

## 2016-01-30 NOTE — ED Triage Notes (Signed)
Reports chest pain that started today. Lasted 10 minutes and then started feeling really weak.  Pain was sternal. Had SHOB during episode but currently no pain or shortness of breath.

## 2016-01-30 NOTE — ED Provider Notes (Signed)
[facility] Emergency Department Provider Note  ____________________________________________   First MD Initiated Contact with Patient 01/30/16 1628     (approximate)  I have reviewed the triage vital signs and the nursing notes.   HISTORY  Chief Complaint Chest Pain    HPI Sabrina Espinoza is a 50 y.o. female with a history of chronic pain and a neurosurgical procedure about 2 months ago in [place] and really tobacco use who presents for evaluation of an acute onset of moderate sharp chest pain that radiated from her chest up to her neck.  This occurred about 7 hours prior to arrival.  She states that was brief and the chest pain only lasted for a couple of minutes and afterward she felt a little bit weak.  It scared her because she lives alone and has never felt anything like that before.  She denies fever/chills, nausea, vomiting, diarrhea, abdominal pain, numbness in any of her arms or legs, difficulty with ambulation.She has not had any recurrence of the symptoms.  She was not working at the time and did not sustain any trauma.  Nothing made the pain better or worse and it quickly resolved on its own.  She takes multiple BC powders over the course of the day so she has the equivalent of multiple full dose aspirins every day.  She has hypertension, no diabetes, no hyperlipidemia, and no close family members with heart disease   Past Medical History:  Diagnosis Date  . Chronic constipation    on Linzess, Miralax, Zofran  . Chronic hepatitis C with hepatic coma (HCC)    Followed by Dr. Servando Snare. Starting [facility] treatment for 12 weeks in 2016  . Chronic insomnia 11/02/2015  . COPD, moderate (HCC)   . Depression with anxiety    Followed by Dr. Lynett Fish, psychiatrist at [facility] and Wilma Flavin for thrapy  . History of chicken pox   . History of cocaine use   . History of pneumonia   . Hypothyroidism, adult    Working with Dr. Horton Chin, Endocrinology. S/P RAIU and is planning on biopsy  . Migraine without status migrainosus, not intractable   . Nicotine dependence, uncomplicated   . Nontoxic multinodular goiter    FNA done 10/30/14 with benign results. Seen [name]. Patient may want a second opinion.  . Rhinitis, allergic   . Vitamin D deficiency     Patient Active Problem List   Diagnosis Date Noted  . Right cervical radiculopathy 01/15/2016  . Chronic insomnia 11/02/2015  . Right knee pain 08/22/2015  . Midline low back pain without sciatica 05/07/2015  . Elevated blood pressure 04/20/2015  . Polypharmacy 03/22/2015  . Foot pain, right 03/16/2015  . Pain pharynx 03/08/2015  . GERD without esophagitis 03/08/2015  . Abnormal mammogram of right breast 03/07/2015  . Degenerative disc disease, cervical 02/26/2015  . Bilateral leg cramps 02/26/2015  . Skin lesions, generalized 02/26/2015  . Allergic rhinitis with postnasal drip 01/31/2015  . Chronic sinusitis with recurrent bronchitis 01/31/2015  . Oral thrush 01/31/2015  . History of cocaine use 01/16/2015  . 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 Chronic hepatitis C without hepatic coma (HCC) 01/16/2015  . Chronic constipation 01/16/2015  . COPD, moderate (HCC) 01/16/2015  . Cigarette smoker 01/16/2015  . Bipolar 1 disorder, mixed, partial remission (HCC) 01/16/2015  . Migraine without aura and without status migrainosus, not intractable 01/16/2015  . Depression with anxiety 01/16/2015  . Cephalalgia 03/06/2014  . Imbalance  03/06/2014  . Insomnia due to medical condition 03/06/2014    Past Surgical History:  Procedure Laterality Date  . ANKLE FRACTURE SURGERY  1988   rods and pins in place  . CESAREAN SECTION  1996  . ECTOPIC PREGNANCY SURGERY  2003    Prior to Admission medications   Medication Sig Start Date End Date Taking? Authorizing Provider  azelastine (ASTELIN) 0.1 % nasal spray Place into the nose 2 (two) times daily. Use in  each nostril as directed    Historical Provider, MD  budesonide-formoterol (SYMBICORT) 160-4.5 MCG/ACT inhaler Inhale 2 puffs into the lungs 2 (two) times daily. USE 2 PUFFS 2 TIMES A DAY 11/02/15   Kerman Passey, MD  Calcium Carb-Cholecalciferol (CALCIUM CARBONATE-VITAMIN D3) 600-400 MG-UNIT TABS Take by mouth.    Historical Provider, MD  clonazePAM (KLONOPIN) 1 MG tablet Take 1 mg by mouth 3 (three) times daily.  02/09/14   Historical Provider, MD  EPIPEN 2-PAK 0.3 MG/0.3ML SOAJ injection  02/23/15   Historical Provider, MD  fluticasone (FLONASE) 50 MCG/ACT nasal spray Place 1 spray into both nostrils 2 (two) times daily. 12/11/15   Kerman Passey, MD  gabapentin (NEURONTIN) 300 MG capsule Take 300 mg by mouth 3 (three) times daily.  02/09/14   Historical Provider, MD  ibuprofen (ADVIL,MOTRIN) 800 MG tablet Take 1 tablet (800 mg total) by mouth every 8 (eight) hours as needed. Take with food; do not take additional NSAIDs 01/18/16   Kerman Passey, MD  lamoTRIgine (LAMICTAL) 200 MG tablet Take 1 tablet by mouth daily. 01/09/15   Historical Provider, MD  levocetirizine (XYZAL) 5 MG tablet Take 1 tablet (5 mg total) by mouth every evening. 11/02/15   Kerman Passey, MD  montelukast (SINGULAIR) 10 MG tablet TAKE 1 TABLET (10 MG TOTAL) BY MOUTH AT BEDTIME. 01/23/16   Kerman Passey, MD  ondansetron (ZOFRAN) 4 MG tablet 1 ORAL EVERY SIX HOURS AS NEEDED 06/18/15   Edwena Felty, MD  oxyCODONE-acetaminophen (PERCOCET) 5-325 MG tablet Take 1 tablet by mouth every 4 (four) hours as needed for severe pain. 01/19/16   Tommi Rumps, PA-C  pantoprazole (PROTONIX) 40 MG tablet TAKE 1 TABLET (40 MG TOTAL) BY MOUTH DAILY. 05/23/15   Edwena Felty, MD  polyethylene glycol powder (GLYCOLAX/MIRALAX) powder  03/11/15   Historical Provider, MD  prazosin (MINIPRESS) 2 MG capsule Take 1 capsule by mouth daily. 01/08/15   Historical Provider, MD  promethazine (PHENERGAN) 25 MG tablet TAKE 1 TABLET BY MOUTH EVERY 6 HOURS AS NEEDED  03/15/15   Edwena Felty, MD  rOPINIRole (REQUIP) 2 MG tablet Take 2 mg by mouth 2 (two) times daily. 01/02/15   Historical Provider, MD  SUMAtriptan (IMITREX) 100 MG tablet Take 100 mg by mouth every 2 (two) hours as needed for migraine. May repeat in 2 hours if headache persists or recurs.    Historical Provider, MD  Suvorexant (BELSOMRA) 15 MG TABS Take by mouth.    Historical Provider, MD  VENTOLIN HFA 108 (90 BASE) MCG/ACT inhaler Inhale 1-2 puffs into the lungs every 6 (six) hours as needed for wheezing or shortness of breath. 03/22/15   Edwena Felty, MD  zaleplon (SONATA) 10 MG capsule  08/02/15   Historical Provider, MD    Allergies Review of patient's allergies indicates no known allergies.  Family History  Problem Relation Age of Onset  . Heart disease Father   . Alcohol abuse Father   . Diabetes Father   . Heart disease  Brother   . Depression Brother   . Stroke Brother   . Alcohol abuse Paternal Grandfather   . Breast cancer Neg Hx     Social History Social History  Substance Use Topics  . Smoking status: Current Every Day Smoker    Packs/day: 0.50    Types: Cigarettes  . Smokeless tobacco: Not on file  . Alcohol use No    Review of Systems Constitutional: No fever/chills Eyes: No visual changes. ENT: No sore throat. Cardiovascular: +chest pain. Respiratory: Denies shortness of breath. Gastrointestinal: No abdominal pain.  No nausea, no vomiting.  No diarrhea.  No constipation. Genitourinary: Negative for dysuria. Musculoskeletal: Negative for back pain. Skin: Negative for rash. Neurological: Negative for headaches, focal weakness or numbness.  10-point ROS otherwise negative.  ____________________________________________   PHYSICAL EXAM:  VITAL SIGNS: ED Triage Vitals  Enc Vitals Group     BP 01/30/16 1414 (!) 145/105     Pulse Rate 01/30/16 1414 93     Resp 01/30/16 1414 20     Temp 01/30/16 1414 98.4 F (36.9 C)     Temp Source 01/30/16 1414  Oral     SpO2 01/30/16 1414 99 %     Weight 01/30/16 1414 180 lb (81.6 kg)     Height 01/30/16 1414 5\' 9"  (1.753 m)     Head Circumference --      Peak Flow --      Pain Score 01/30/16 1604 0     Pain Loc --      Pain Edu? --      Excl. in GC? --     Constitutional: Alert and oriented. Well appearing and in no acute distress.  Appears older than stated age. Eyes: Conjunctivae are normal. PERRL. EOMI. Head: Atraumatic. Nose: No congestion/rhinnorhea. Neck: No stridor.  No meningeal signs.   Cardiovascular: Normal rate, regular rhythm. Good peripheral circulation. Grossly normal heart sounds.   Respiratory: Normal respiratory effort.  No retractions. Lungs CTAB. Gastrointestinal: Soft and nontender. No distention.  Musculoskeletal: No lower extremity tenderness nor edema. No gross deformities of extremities. Neurologic:  Normal speech and language. No gross focal neurologic deficits are appreciated.  Skin:  Skin is warm, dry and intact. No rash noted. Psychiatric: Mood and affect are normal. Speech and behavior are normal.  ____________________________________________   LABS (all labs ordered are listed, but only abnormal results are displayed)  Labs Reviewed  BASIC METABOLIC PANEL - Abnormal; Notable for the following:       Result Value   Glucose, Bld 100 (*)    All other components within normal limits  CBC - Abnormal; Notable for the following:    WBC 14.3 (*)    RDW 15.2 (*)    Platelets 514 (*)    All other components within normal limits  URINALYSIS COMPLETEWITH MICROSCOPIC ([facility] ONLY) - Abnormal; Notable for the following:    Color, Urine STRAW (*)    APPearance CLEAR (*)    Specific Gravity, Urine 1.003 (*)    Leukocytes, UA 2+ (*)    Squamous Epithelial / LPF 0-5 (*)    All other components within normal limits  TROPONIN I   ____________________________________________  EKG  ED ECG REPORT I, Janica Eldred, the attending physician, personally viewed and  interpreted this ECG.  Date: 01/30/2016 EKG Time: 14:02 Rate: 92 Rhythm: normal sinus rhythm QRS Axis: normal Intervals: normal ST/T Wave abnormalities: normal Conduction Disturbances: none Narrative Interpretation: unremarkable  ____________________________________________  RADIOLOGY   Dg Chest  2 View  Result Date: 01/30/2016 CLINICAL DATA:  Chest pain. EXAM: CHEST  2 VIEW COMPARISON:  05/01/2015 FINDINGS: The heart size and mediastinal contours are within normal limits. Both lungs are clear. The visualized skeletal structures are unremarkable. IMPRESSION: No active cardiopulmonary disease. Electronically Signed   By: Kennith Center M.D.   On: 01/30/2016 14:45   ____________________________________________   PROCEDURES  Procedure(s) performed:   Procedures   ____________________________________________   INITIAL IMPRESSION / ASSESSMENT AND PLAN / ED COURSE  Pertinent labs & imaging results that were available during my care of the patient were reviewed by me and considered in my medical decision making (see chart for details).  HEART score 3 (low risk), PERC negative.  Already had full dose aspirin.  The symptoms occurred well in excess of greater than 4 hours ago and the patient wants to go home; there is no indication to keep her for a second troponin.  She is appropriate for outpatient follow-up.    I gave my usual and customary return precautions.     Clinical Course    ____________________________________________  FINAL CLINICAL IMPRESSION(S) / ED DIAGNOSES  Final diagnoses:  Nonspecific chest pain     MEDICATIONS GIVEN DURING THIS VISIT:  Medications - No data to display   NEW OUTPATIENT MEDICATIONS STARTED DURING THIS VISIT:  New Prescriptions   No medications on file      Note:  This document was prepared using Dragon voice recognition software and may include unintentional dictation errors.    Loleta Rose, MD 01/30/16 [phone]

## 2016-01-30 NOTE — ED Notes (Signed)
MD at bedside. 

## 2016-01-30 NOTE — Discharge Instructions (Signed)

## 2016-01-31 ENCOUNTER — Other Ambulatory Visit: Payer: Self-pay

## 2016-01-31 DIAGNOSIS — J309 Allergic rhinitis, unspecified: Secondary | ICD-10-CM

## 2016-01-31 DIAGNOSIS — R0982 Postnasal drip: Principal | ICD-10-CM

## 2016-01-31 MED ORDER — LEVOCETIRIZINE DIHYDROCHLORIDE 5 MG PO TABS: 5.0000 mg | ORAL_TABLET | Freq: Every evening | ORAL | 5 refills | Status: DC

## 2016-01-31 NOTE — Telephone Encounter (Signed)
rx approved

## 2016-02-01 ENCOUNTER — Encounter: Payer: Self-pay | Admitting: Internal Medicine

## 2016-02-01 ENCOUNTER — Ambulatory Visit (INDEPENDENT_AMBULATORY_CARE_PROVIDER_SITE_OTHER): Payer: Medicare Other | Admitting: Internal Medicine

## 2016-02-01 VITALS — BP 128/72 | HR 84 | Ht 69.0 in | Wt 184.0 lb

## 2016-02-01 DIAGNOSIS — G4730 Sleep apnea, unspecified: Secondary | ICD-10-CM | POA: Diagnosis not present

## 2016-02-01 DIAGNOSIS — G4719 Other hypersomnia: Secondary | ICD-10-CM | POA: Diagnosis not present

## 2016-02-01 DIAGNOSIS — J449 Chronic obstructive pulmonary disease, unspecified: Secondary | ICD-10-CM

## 2016-02-01 NOTE — Patient Instructions (Addendum)
Will need sleep study Check PFT and Check ONO   Sleep Apnea  Sleep apnea is a sleep disorder characterized by abnormal pauses in breathing while you sleep. When your breathing pauses, the level of oxygen in your blood decreases. This causes you to move out of deep sleep and into light sleep. As a result, your quality of sleep is poor, and the system that carries your blood throughout your body (cardiovascular system) experiences stress. If sleep apnea remains untreated, the following conditions can develop:  High blood pressure (hypertension).  Coronary artery disease.  Inability to achieve or maintain an erection (impotence).  Impairment of your thought process (cognitive dysfunction). There are three types of sleep apnea: 1. Obstructive sleep apnea--Pauses in breathing during sleep because of a blocked airway. 2. Central sleep apnea--Pauses in breathing during sleep because the area of the brain that controls your breathing does not send the correct signals to the muscles that control breathing. 3. Mixed sleep apnea--A combination of both obstructive and central sleep apnea. RISK FACTORS The following risk factors can increase your risk of developing sleep apnea:  Being overweight.  Smoking.  Having narrow passages in your nose and throat.  Being of older age.  Being female.  Alcohol use.  Sedative and tranquilizer use.  Ethnicity. Among individuals younger than 35 years, African Americans are at increased risk of sleep apnea. SYMPTOMS   Difficulty staying asleep.  Daytime sleepiness and fatigue.  Loss of energy.  Irritability.  Loud, heavy snoring.  Morning headaches.  Trouble concentrating.  Forgetfulness.  Decreased interest in sex.  Unexplained sleepiness. DIAGNOSIS  In order to diagnose sleep apnea, your caregiver will perform a physical examination. A sleep study done in the comfort of your own home may be appropriate if you are otherwise healthy.  Your caregiver may also recommend that you spend the night in a sleep lab. In the sleep lab, several monitors record information about your heart, lungs, and brain while you sleep. Your leg and arm movements and blood oxygen level are also recorded. TREATMENT The following actions may help to resolve mild sleep apnea:  Sleeping on your side.   Using a decongestant if you have nasal congestion.   Avoiding the use of depressants, including alcohol, sedatives, and narcotics.   Losing weight and modifying your diet if you are overweight. There also are devices and treatments to help open your airway:  Oral appliances. These are custom-made mouthpieces that shift your lower jaw forward and slightly open your bite. This opens your airway.  Devices that create positive airway pressure. This positive pressure "splints" your airway open to help you breathe better during sleep. The following devices create positive airway pressure:  Continuous positive airway pressure (CPAP) device. The CPAP device creates a continuous level of air pressure with an air pump. The air is delivered to your airway through a mask while you sleep. This continuous pressure keeps your airway open.  Nasal expiratory positive airway pressure (EPAP) device. The EPAP device creates positive air pressure as you exhale. The device consists of single-use valves, which are inserted into each nostril and held in place by adhesive. The valves create very little resistance when you inhale but create much more resistance when you exhale. That increased resistance creates the positive airway pressure. This positive pressure while you exhale keeps your airway open, making it easier to breath when you inhale again.  Bilevel positive airway pressure (BPAP) device. The BPAP device is used mainly in patients with  central sleep apnea. This device is similar to the CPAP device because it also uses an air pump to deliver continuous air pressure  through a mask. However, with the BPAP machine, the pressure is set at two different levels. The pressure when you exhale is lower than the pressure when you inhale.  Surgery. Typically, surgery is only done if you cannot comply with less invasive treatments or if the less invasive treatments do not improve your condition. Surgery involves removing excess tissue in your airway to create a wider passage way.   This information is not intended to replace advice given to you by your health care provider. Make sure you discuss any questions you have with your health care provider.   Document Released: 06/13/2002 Document Revised: 07/14/2014 Document Reviewed: 10/30/2011 Else[employer] Interactive Patient Education [employer]

## 2016-02-01 NOTE — Progress Notes (Signed)
[facility] [place] Pulmonary Medicine Consultation      Date: 02/01/2016,   MRN# [id number] Sabrina Espinoza December 19, 1965 Code Status:  Code Status History    This patient does not have a recorded code status. Please follow your organizational policy for patients in this situation.     Hosp day:@LENGTHOFSTAYDAYS @ Referring MD: @ATDPROV @     PCP:      AdmissionWeight: 184 lb (83.5 kg)                 CurrentWeight: 184 lb (83.5 kg) Sabrina Espinoza is a 50 y.o. old female seen in consultation for problems sleeping at the request of Dr. Sherie Don .     CHIEF COMPLAINT:   Problems sleeping   HISTORY OF PRESENT ILLNESS  50 yo white female seen today for problems with sleeping, states that she has been told of loud snoring She excessive tiredness and and excessive fatigue all the time, been going on for many years She continues to smoke despite being on inhaler therapy Smokes 1 ppd for 32 years Has had insomnia for many years wakes up several times during the night Watches TV at night a lot Previous drug uses, is  Separated,  has children On disability, lives alone     PAST MEDICAL HISTORY   Past Medical History:  Diagnosis Date  . Chronic constipation    on Linzess, Miralax, Zofran  . Chronic insomnia 11/02/2015  . COPD, moderate (HCC)   . Depression with anxiety    Followed by Dr. Lynett Fish, psychiatrist at [facility] and Wilma Flavin for thrapy  . History of chicken pox   . History of cocaine use   . History of pneumonia   . Hypothyroidism, adult    Working with Dr. Horton Chin, Endocrinology. S/P RAIU and is planning on biopsy  . Migraine without status migrainosus, not intractable   . Nicotine dependence, uncomplicated   . Nontoxic multinodular goiter    FNA done 10/30/14 with benign results. Seen [name]. Patient may want a second opinion.  . Rhinitis, allergic   . Vitamin D deficiency      SURGICAL HISTORY   Past Surgical History:    Procedure Laterality Date  . ANKLE FRACTURE SURGERY  1988   rods and pins in place  . CESAREAN SECTION  1996  . ECTOPIC PREGNANCY SURGERY  2003     FAMILY HISTORY   Family History  Problem Relation Age of Onset  . Heart disease Father   . Alcohol abuse Father   . Diabetes Father   . Heart disease Brother   . Depression Brother   . Stroke Brother   . Alcohol abuse Paternal Grandfather   . Breast cancer Neg Hx      SOCIAL HISTORY   Social History  Substance Use Topics  . Smoking status: Current Every Day Smoker    Packs/day: 1.00    Years: 32.00    Types: Cigarettes  . Smokeless tobacco: Never Used  . Alcohol use No     MEDICATIONS    Home Medication:  Current Outpatient Rx  . Order #: [id number] Class: Historical Med  . Order #: [id number] Class: Normal  . Order #: [id number] Class: Historical Med  . Order #: [id number] Class: Historical Med  . Order #: [id number] Class: Historical Med  . Order #: [id number] Class: Normal  . Order #: [id number] Class: Historical Med  . Order #: [id number] Class: Normal  . Order #: [id number] Class: Historical Med  . Order #: [id number] Class: Normal  . Order #:  [id number] Class: Normal  . Order #: [id number] Class: Normal  . Order #: [id number] Class: Print  . Order #: [id number] Class: Normal  . Order #: [id number] Class: Historical Med  . Order #: [id number] Class: Historical Med  . Order #: [id number] Class: Normal  . Order #: [id number] Class: Historical Med  . Order #: [id number] Class: Historical Med  . Order #: [id number] Class: Historical Med  . Order #: [id number] Class: Normal  . Order #: [id number] Class: Historical Med    Current Medication:  Current Outpatient Prescriptions:  .  azelastine (ASTELIN) 0.1 % nasal spray, Place into the nose 2 (two) times daily. Use in each nostril as directed, Disp: , Rfl:  .  budesonide-formoterol (SYMBICORT) 160-4.5 MCG/ACT inhaler, Inhale 2 puffs into the lungs 2 (two) times daily. USE 2 PUFFS 2 TIMES A DAY,  Disp: 10.2 Inhaler, Rfl: 2 .  Calcium Carb-Cholecalciferol (CALCIUM CARBONATE-VITAMIN D3) 600-400 MG-UNIT TABS, Take by mouth., Disp: , Rfl:  .  clonazePAM (KLONOPIN) 1 MG tablet, Take 1 mg by mouth 3 (three) times daily. , Disp: , Rfl:  .  EPIPEN 2-PAK 0.3 MG/0.3ML SOAJ injection, , Disp: , Rfl:  .  fluticasone (FLONASE) 50 MCG/ACT nasal spray, Place 1 spray into both nostrils 2 (two) times daily., Disp: 16 g, Rfl: 11 .  gabapentin (NEURONTIN) 300 MG capsule, Take 300 mg by mouth 3 (three) times daily. , Disp: , Rfl:  .  ibuprofen (ADVIL,MOTRIN) 800 MG tablet, Take 1 tablet (800 mg total) by mouth every 8 (eight) hours as needed. Take with food; do not take additional NSAIDs, Disp: 30 tablet, Rfl: 0 .  lamoTRIgine (LAMICTAL) 200 MG tablet, Take 1 tablet by mouth daily., Disp: , Rfl:  .  levocetirizine (XYZAL) 5 MG tablet, Take 1 tablet (5 mg total) by mouth every evening., Disp: 30 tablet, Rfl: 5 .  montelukast (SINGULAIR) 10 MG tablet, TAKE 1 TABLET (10 MG TOTAL) BY MOUTH AT BEDTIME., Disp: 30 tablet, Rfl: 6 .  ondansetron (ZOFRAN) 4 MG tablet, 1 ORAL EVERY SIX HOURS AS NEEDED, Disp: 45 tablet, Rfl: 3 .  oxyCODONE-acetaminophen (PERCOCET) 5-325 MG tablet, Take 1 tablet by mouth every 4 (four) hours as needed for severe pain., Disp: 13 tablet, Rfl: 0 .  pantoprazole (PROTONIX) 40 MG tablet, TAKE 1 TABLET (40 MG TOTAL) BY MOUTH DAILY., Disp: 90 tablet, Rfl: 2 .  polyethylene glycol powder (GLYCOLAX/MIRALAX) powder, , Disp: , Rfl:  .  prazosin (MINIPRESS) 2 MG capsule, Take 1 capsule by mouth daily., Disp: , Rfl:  .  promethazine (PHENERGAN) 25 MG tablet, TAKE 1 TABLET BY MOUTH EVERY 6 HOURS AS NEEDED, Disp: 40 tablet, Rfl: 5 .  rOPINIRole (REQUIP) 2 MG tablet, Take 2 mg by mouth 2 (two) times daily., Disp: , Rfl: 4 .  SUMAtriptan (IMITREX) 100 MG tablet, Take 100 mg by mouth every 2 (two) hours as needed for migraine. May repeat in 2 hours if headache persists or recurs., Disp: , Rfl:  .   Suvorexant (BELSOMRA) 15 MG TABS, Take by mouth., Disp: , Rfl:  .  VENTOLIN HFA 108 (90 BASE) MCG/ACT inhaler, Inhale 1-2 puffs into the lungs every 6 (six) hours as needed for wheezing or shortness of breath., Disp: 8 g, Rfl: 5 .  zaleplon (SONATA) 10 MG capsule, , Disp: , Rfl:     ALLERGIES   Review of patient's allergies indicates no known allergies.     REVIEW OF SYSTEMS   Review of Systems  Constitutional: Positive for malaise/fatigue. Negative for chills, diaphoresis, fever and weight loss.  HENT: Negative for congestion and hearing loss.   Eyes: Negative for blurred vision and double vision.  Respiratory: Positive for cough. Negative for hemoptysis, sputum production, shortness of breath and wheezing.   Cardiovascular: Negative for chest pain, palpitations and orthopnea.  Gastrointestinal: Negative for abdominal pain, heartburn, nausea and vomiting.  Genitourinary: Negative for dysuria and urgency.  Skin: Negative for rash.  Neurological: Negative for dizziness, weakness and headaches.  Endo/Heme/Allergies: Does not bruise/bleed easily.  Psychiatric/Behavioral: Negative for depression.  All other systems reviewed and are negative.    VS: BP 128/72 (BP Location: Left Arm, Cuff Size: Normal)   Pulse 84   Ht  (1.753 m)   Wt 184 lb (83.5 kg)   SpO2 96%   BMI 27.17 kg/m      PHYSICAL EXAM  Physical Exam  Constitutional: She is oriented to person, place, and time. She appears well-developed and well-nourished. No distress.  HENT:  Head: Normocephalic and atraumatic.  Mouth/Throat: No oropharyngeal exudate.  Eyes: EOM are normal. Pupils are equal, round, and reactive to light. No scleral icterus.  Neck: Normal range of motion. Neck supple.  Cardiovascular: Normal rate, regular rhythm and normal heart sounds.   No murmur heard. Pulmonary/Chest: No stridor. No respiratory distress. She has no wheezes. She has no rales.  Abdominal: Soft. Bowel sounds are normal.    Musculoskeletal: Normal range of motion. She exhibits no edema.  Neurological: She is alert and oriented to person, place, and time. No cranial nerve deficit.  Skin: Skin is warm. She is not diaphoretic.  Psychiatric: She has a normal mood and affect.        LABS    Recent Labs     01/30/16  1413  HGB  13.1  HCT  39.1  MCV  87.3  WBC  14.3*  BUN  8  CREATININE  0.85  GLUCOSE  100*  CALCIUM  9.4  ,       IMAGING    Dg Chest 2 View  Result Date: 01/30/2016 CLINICAL DATA:  Chest pain. EXAM: CHEST  2 VIEW COMPARISON:  05/01/2015 FINDINGS: The heart size and mediastinal contours are within normal limits. Both lungs are clear. The visualized skeletal structures are unremarkable. IMPRESSION: No active cardiopulmonary disease. Electronically Signed   By: Kennith Center M.D.   On: 01/30/2016 14:45  Mr Cervical Spine Wo Contrast  Result Date: 01/29/2016 CLINICAL DATA:  Numbness and tingling in the right shoulder and arm for 4 days. Status post C6-7 fusion 11/16/2015. EXAM: MRI CERVICAL SPINE WITHOUT CONTRAST TECHNIQUE: Multiplanar, multisequence MR imaging of the cervical spine was performed. No intravenous contrast was administered. COMPARISON:  Plain film cervical spine 12/18/2015. MRI cervical spine 10/16/2015. FINDINGS: Alignment: Maintained. Vertebrae: Height and signal are unremarkable. Cord: Normal signal throughout. Posterior Fossa, vertebral arteries, paraspinal tissues: Unremarkable. Disc levels: C2-3: Tiny central protrusion. The central canal and foramina are widely patent. C3-4: There is facet degenerative change, more notable on the left. Minimal disc bulge without central canal or foraminal stenosis. C4-5: Mild uncovertebral disease on the left. The central canal and foramina are widely patent. C5-6: Minimal disc bulge. The central canal and foramina are widely patent. C6-7: The patient is status post anterior discectomy and fusion since the prior examination. The central  canal is widely patent. Uncovertebral spurring causes mild foraminal narrowing bilaterally. No complicating feature from surgery is identified. C7-T1: There is facet degenerative change. The central canal and foramina are widely patent. IMPRESSION: Status post C6-7 ACDF since  the prior examination without evidence of complication. The central canal is widely patent. There appears to mild foraminal narrowing due to uncovertebral spurring bilaterally. The central canal and foramina are otherwise widely patent. No other change is identified. Electronically Signed   By: Drusilla Kanner M.D.   On: 01/29/2016 14:41     CXR on 01/30/2016 images reveiwed No acute or active process noted    ASSESSMENT/PLAN   51 yo white female with excessive fatigue and tiredness with problems sleeping at night with ongoing tobacco abuse with underlying COPD  1.will need to check PFT with 2.will need ONO 3.will need sleep study 4.smoking cessation strongly advised 5.sleep hygiene recommended, stop watching TV at night  Follow up after tests are completed   The Patient requires high complexity decision making for assessment and support, frequent evaluation and titration of therapies, application of advanced monitoring technologies and extensive interpretation of multiple databases.   Patient  satisfied with Plan of action and management. All questions answered  Lucie Leather, M.D.  [facility]  Medical Director [facility] Medical Director [facility] Cardio-Pulmonary Department

## 2016-02-04 DIAGNOSIS — F5101 Primary insomnia: Secondary | ICD-10-CM | POA: Insufficient documentation

## 2016-02-06 ENCOUNTER — Telehealth: Payer: Self-pay | Admitting: Family Medicine

## 2016-02-06 NOTE — Telephone Encounter (Signed)
I refaxed on form from pharmacy this am

## 2016-02-07 ENCOUNTER — Other Ambulatory Visit: Payer: Self-pay

## 2016-02-07 ENCOUNTER — Telehealth: Payer: Self-pay | Admitting: Internal Medicine

## 2016-02-07 NOTE — Telephone Encounter (Signed)
LMOM and gave number to sleep med to call and find out when her appt and instructions. Nothing further needed.

## 2016-02-07 NOTE — Telephone Encounter (Signed)
Pt needs information for sleep study. Arrival time, pick up time, etc. Please call.

## 2016-02-08 ENCOUNTER — Ambulatory Visit: Payer: Medicare Other | Admitting: Family Medicine

## 2016-02-08 MED ORDER — PANTOPRAZOLE SODIUM 40 MG PO TBEC: 40.0000 mg | DELAYED_RELEASE_TABLET | Freq: Every day | ORAL | 0 refills | Status: DC | PRN

## 2016-02-08 NOTE — Telephone Encounter (Signed)
Patient no showed today No record of barretts in history Will limit to 30 days, must be seen for further refills

## 2016-03-03 ENCOUNTER — Other Ambulatory Visit: Payer: Self-pay

## 2016-03-07 ENCOUNTER — Other Ambulatory Visit (HOSPITAL_COMMUNITY): Payer: Self-pay | Admitting: Neurosurgery

## 2016-03-07 ENCOUNTER — Other Ambulatory Visit: Payer: Self-pay | Admitting: Neurosurgery

## 2016-03-07 DIAGNOSIS — M47892 Other spondylosis, cervical region: Secondary | ICD-10-CM

## 2016-03-11 ENCOUNTER — Encounter: Payer: Self-pay | Admitting: Family Medicine

## 2016-03-11 ENCOUNTER — Ambulatory Visit (INDEPENDENT_AMBULATORY_CARE_PROVIDER_SITE_OTHER): Payer: Medicare Other | Admitting: Family Medicine

## 2016-03-11 DIAGNOSIS — K219 Gastro-esophageal reflux disease without esophagitis: Secondary | ICD-10-CM

## 2016-03-11 DIAGNOSIS — R231 Pallor: Secondary | ICD-10-CM | POA: Diagnosis not present

## 2016-03-11 DIAGNOSIS — B182 Chronic viral hepatitis C: Secondary | ICD-10-CM

## 2016-03-11 DIAGNOSIS — K144 Atrophy of tongue papillae: Secondary | ICD-10-CM | POA: Diagnosis not present

## 2016-03-11 DIAGNOSIS — G6289 Other specified polyneuropathies: Secondary | ICD-10-CM | POA: Diagnosis not present

## 2016-03-11 DIAGNOSIS — E042 Nontoxic multinodular goiter: Secondary | ICD-10-CM | POA: Diagnosis not present

## 2016-03-11 DIAGNOSIS — G629 Polyneuropathy, unspecified: Secondary | ICD-10-CM | POA: Insufficient documentation

## 2016-03-11 DIAGNOSIS — Z5181 Encounter for therapeutic drug level monitoring: Secondary | ICD-10-CM | POA: Diagnosis not present

## 2016-03-11 DIAGNOSIS — B370 Candidal stomatitis: Secondary | ICD-10-CM | POA: Insufficient documentation

## 2016-03-11 MED ORDER — PANTOPRAZOLE SODIUM 40 MG PO TBEC: 40.0000 mg | DELAYED_RELEASE_TABLET | Freq: Every day | ORAL | 2 refills | Status: DC | PRN

## 2016-03-11 NOTE — Progress Notes (Signed)
BP 118/82   Pulse 97   Temp 98.8 F (37.1 C) (Oral)   Wt 182 lb (82.6 kg)   SpO2 98%   BMI 26.88 kg/m    Subjective:    Patient ID: Sabrina Espinoza, female    DOB: 1966/02/18, 50 y.o.   MRN: [id number]  HPI: Sabrina Espinoza is a 50 y.o. female  Chief Complaint  Patient presents with  . Medication Refill    reflux meds  . Labs Only   She says she came in for Protonix refills She can drink water and can get indigestion; she has not an EGD; no blood in stool Dr. Andee Poles did a laryngoscopy, but not EGD  She takes tums regularly She doesn't know what an ulcer would feel like; she has taken [facility] powders since age 73 She wants her liver checked; she takes tylenol; she takes 500 mg pills, six or eight a day; she has headaches and back and neck pain She got to see Dr. Theora Master and he has her on notriptyline for the headaches She is going to see her spine doctor for things with her neck, right side; they can't figure out what's wrong; taking percocet 3 a day; when I asked if she knew she can't take tylenol with that, she knows to not take them together and that the percocet already has tylenol in it She has hx of hepatitis C and "they cured it" she says; was on Harvoni for 3 months; she saw doctor in [place] (Dr. Servando Snare) No alcohol; no multiple vitamins She goes for sleep study soon No trouble with walking, but does have numbness in the feet at times; numbness in her hands  Depression screen [facility] 2/9 03/11/2016 11/02/2015 08/15/2015 03/16/2015 02/26/2015  Decreased Interest 2 0 0 0 1  Down, Depressed, Hopeless 3 0 0 0 1  PHQ - 2 Score 5 0 0 0 2  Altered sleeping 3 - - - 2  Tired, decreased energy 3 - - - 2  Change in appetite 0 - - - 1  Feeling bad or failure about yourself  0 - - - 0  Trouble concentrating 0 - - - 0  Moving slowly or fidgety/restless 0 - - - 0  Suicidal thoughts 0 - - - 0  PHQ-9 Score 11 - - - 7  Difficult doing work/chores Somewhat difficult - - - Somewhat difficult     Relevant past medical, surgical, family and social history reviewed Past Medical History:  Diagnosis Date  . Chronic constipation    on Linzess, Miralax, Zofran  . Chronic insomnia 11/02/2015  . COPD, moderate (HCC)   . Depression with anxiety    Followed by Dr. Lynett Fish, psychiatrist at [facility] and Wilma Flavin for thrapy  . History of chicken pox   . History of cocaine use   . History of pneumonia   . Hypothyroidism, adult    Working with Dr. Horton Chin, Endocrinology. S/P RAIU and is planning on biopsy  . Migraine without status migrainosus, not intractable   . Nicotine dependence, uncomplicated   . Nontoxic multinodular goiter    FNA done 10/30/14 with benign results. Seen [name]. Patient may want a second opinion.  . Rhinitis, allergic   . Vitamin D deficiency    Past Surgical History:  Procedure Laterality Date  . ANKLE FRACTURE SURGERY  1988   rods and pins in place  . CESAREAN SECTION  1996  . ECTOPIC PREGNANCY SURGERY  2003  Family History  Problem Relation Age of Onset  . Heart disease Father   . Alcohol abuse Father   . Diabetes Father   . Heart disease Brother   . Depression Brother   . Stroke Brother   . Alcohol abuse Paternal Grandfather   . Breast cancer Neg Hx    Social History  Substance Use Topics  . Smoking status: Current Every Day Smoker    Packs/day: 1.00    Years: 32.00    Types: Cigarettes  . Smokeless tobacco: Never Used  . Alcohol use No   Interim medical history since last visit reviewed. Allergies and medications reviewed  Review of Systems Per HPI unless specifically indicated above     Objective:    BP 118/82   Pulse 97   Temp 98.8 F (37.1 C) (Oral)   Wt 182 lb (82.6 kg)   SpO2 98%   BMI 26.88 kg/m   Wt Readings from Last 3 Encounters:  03/11/16 182 lb (82.6 kg)  02/01/16 184 lb (83.5 kg)  01/30/16 180 lb (81.6 kg)    Physical Exam  Constitutional: She appears well-developed and  well-nourished. No distress.  overweight  HENT:  Head: Normocephalic and atraumatic.  Nose: No rhinorrhea.  Atrophy of tongue papillae; no thrush noted today  Eyes: EOM are normal. No scleral icterus.  Neck: No thyromegaly present.  Cardiovascular: Normal rate, regular rhythm and normal heart sounds.   Pulmonary/Chest: Effort normal and breath sounds normal. She has no wheezes.  Abdominal: Soft. Bowel sounds are normal. She exhibits no distension.  Musculoskeletal: Normal range of motion. She exhibits no edema.  Neurological: She is alert. She exhibits normal muscle tone.  Skin: Skin is warm and dry. No bruising and no ecchymosis noted. She is not diaphoretic. No pallor.  Marked nailbed pallor relative to examiner  Psychiatric:  Talked loudly through the first part of the visit; appeared somewhat somnolent   Results for orders placed or performed during the hospital encounter of 01/30/16  Basic metabolic panel  Result Value Ref Range   Sodium 138 135 - 145 mmol/L   Potassium 3.9 3.5 - 5.1 mmol/L   Chloride 104 101 - 111 mmol/L   CO2 26 22 - 32 mmol/L   Glucose, Bld 100 (H) 65 - 99 mg/dL   BUN 8 6 - 20 mg/dL   Creatinine, Ser 7.42 0.44 - 1.00 mg/dL   Calcium 9.4 8.9 - 59.5 mg/dL   GFR calc non Af Amer >60 >60 mL/min   GFR calc Af Amer >60 >60 mL/min   Anion gap 8 5 - 15  CBC  Result Value Ref Range   WBC 14.3 (H) 3.6 - 11.0 K/uL   RBC 4.48 3.80 - 5.20 MIL/uL   Hemoglobin 13.1 12.0 - 16.0 g/dL   HCT 63.8 75.6 - 43.3 %   MCV 87.3 80.0 - 100.0 fL   MCH 29.2 26.0 - 34.0 pg   MCHC 33.5 32.0 - 36.0 g/dL   RDW 29.5 (H) 18.8 - 41.6 %   Platelets 514 (H) 150 - 440 K/uL  Troponin I  Result Value Ref Range   Troponin I <0.03 <0.03 ng/mL  Urinalysis complete, with microscopic  Result Value Ref Range   Color, Urine STRAW (A) YELLOW   APPearance CLEAR (A) CLEAR   Glucose, UA NEGATIVE NEGATIVE mg/dL   Bilirubin Urine NEGATIVE NEGATIVE   Ketones, ur NEGATIVE NEGATIVE mg/dL    Specific Gravity, Urine 1.003 (L) 1.005 - 1.030   Hgb  urine dipstick NEGATIVE NEGATIVE   pH 7.0 5.0 - 8.0   Protein, ur NEGATIVE NEGATIVE mg/dL   Nitrite NEGATIVE NEGATIVE   Leukocytes, UA 2+ (A) NEGATIVE   RBC / HPF 0-5 0 - 5 RBC/hpf   WBC, UA 6-30 0 - 5 WBC/hpf   Bacteria, UA NONE SEEN NONE SEEN   Squamous Epithelial / LPF 0-5 (A) NONE SEEN      Assessment & Plan:   Problem List Items Addressed This Visit      Digestive   Tongue papillae atrophy    Check B12 and vitamin B1      Relevant Orders   Vitamin B1   Vitamin B3   GERD without esophagitis    Discussed risk of PPIs; explained risk of pneumonia, C diff colitis, anemia, osteoporosis, kidney disease, and heart disease; she wants to stay on the Protonix despite the associated risk; check H pylori; avoid triggers; elevate HOB      Relevant Medications   pantoprazole (PROTONIX) 40 MG tablet   Other Relevant Orders   H. pylori antigen, stool   CBC with Differential/Platelet   Chronic hepatitis C without hepatic coma (HCC)    Treated by Dr. Servando SnareWohl        Endocrine   Nontoxic multinodular goiter    Nodules followed by Dr. Ignacia FellingMorayati        Nervous and Auditory   Peripheral neuropathy (HCC)   Relevant Medications   nortriptyline (PAMELOR) 50 MG capsule     Other   Pallor    Check CBC      Relevant Orders   CBC with Differential/Platelet   Encounter for medication monitoring    Check labs today      Relevant Orders   CBC with Differential/Platelet   Comprehensive metabolic panel    Other Visit Diagnoses   None.     Follow up plan: No Follow-up on file.  An after-visit summary was printed and given to the patient at check-out.  Please see the patient instructions which may contain other information and recommendations beyond what is mentioned above in the assessment and plan.  Meds ordered this encounter  Medications  . nortriptyline (PAMELOR) 50 MG capsule    Sig: Take 50 mg by mouth 2 (two) times  daily.  . pantoprazole (PROTONIX) 40 MG tablet    Sig: Take 1 tablet (40 mg total) by mouth daily as needed.    Dispense:  30 tablet    Refill:  2    Orders Placed This Encounter  Procedures  . H. pylori antigen, stool  . CBC with Differential/Platelet  . Comprehensive metabolic panel  . Vitamin B1  . Vitamin B3

## 2016-03-11 NOTE — Patient Instructions (Addendum)
Caution: prolonged use of proton pump inhibitors like omeprazole (Prilosec), pantoprazole (Protonix), esomeprazole (Nexium), and others like Dexilant and Aciphex may increase your risk of pneumonia, Clostridium difficile colitis, osteoporosis, anemia and other health complications Try to limit or avoid triggers like coffee, caffeinated beverages, onions, chocolate, spicy foods, peppermint, acid foods like pizza, spaghetti sauce, and orange juice Lose weight if you are overweight or obese Try elevating the head of your bed by placing a small wedge between your mattress and box springs to keep acid in the stomach at night instead of coming up into your esophagus  Start taking a single vitamin B complex pill every day to help with energy We'll get labs today at [facility]   Gastroesophageal Reflux Disease, Adult Normally, food travels down the esophagus and stays in the stomach to be digested. However, when a person has gastroesophageal reflux disease (GERD), food and stomach acid move back up into the esophagus. When this happens, the esophagus becomes sore and inflamed. Over time, GERD can create small holes (ulcers) in the lining of the esophagus.  CAUSES This condition is caused by a problem with the muscle between the esophagus and the stomach (lower esophageal sphincter, or LES). Normally, the LES muscle closes after food passes through the esophagus to the stomach. When the LES is weakened or abnormal, it does not close properly, and that allows food and stomach acid to go back up into the esophagus. The LES can be weakened by certain dietary substances, medicines, and medical conditions, including:  Tobacco use.  Pregnancy.  Having a hiatal hernia.  Heavy alcohol use.  Certain foods and beverages, such as coffee, chocolate, onions, and peppermint. RISK FACTORS This condition is more likely to develop in:  People who have an increased body weight.  People who have connective tissue  disorders.  People who use NSAID medicines. SYMPTOMS Symptoms of this condition include:  Heartburn.  Difficult or painful swallowing.  The feeling of having a lump in the throat.  Abitter taste in the mouth.  Bad breath.  Having a large amount of saliva.  Having an upset or bloated stomach.  Belching.  Chest pain.  Shortness of breath or wheezing.  Ongoing (chronic) cough or a night-time cough.  Wearing away of tooth enamel.  Weight loss. Different conditions can cause chest pain. Make sure to see your health care provider if you experience chest pain. DIAGNOSIS Your health care provider will take a medical history and perform a physical exam. To determine if you have mild or severe GERD, your health care provider may also monitor how you respond to treatment. You may also have other tests, including:  An endoscopy toexamine your stomach and esophagus with a small camera.  A test thatmeasures the acidity level in your esophagus.  A test thatmeasures how much pressure is on your esophagus.  A barium swallow or modified barium swallow to show the shape, size, and functioning of your esophagus. TREATMENT The goal of treatment is to help relieve your symptoms and to prevent complications. Treatment for this condition may vary depending on how severe your symptoms are. Your health care provider may recommend:  Changes to your diet.  Medicine.  Surgery. HOME CARE INSTRUCTIONS Diet  Follow a diet as recommended by your health care provider. This may involve avoiding foods and drinks such as:  Coffee and tea (with or without caffeine).  Drinks that containalcohol.  Energy drinks and sports drinks.  Carbonated drinks or sodas.  Chocolate and cocoa.  Peppermint and mint flavorings.  Garlic and onions.  Horseradish.  Spicy and acidic foods, including peppers, chili powder, curry powder, vinegar, hot sauces, and barbecue sauce.  Citrus fruit juices  and citrus fruits, such as oranges, lemons, and limes.  Tomato-based foods, such as red sauce, chili, salsa, and pizza with red sauce.  Fried and fatty foods, such as donuts, french fries, potato chips, and high-fat dressings.  High-fat meats, such as hot dogs and fatty cuts of red and white meats, such as rib eye steak, sausage, ham, and bacon.  High-fat dairy items, such as whole milk, butter, and cream cheese.  Eat small, frequent meals instead of large meals.  Avoid drinking large amounts of liquid with your meals.  Avoid eating meals during the 2-3 hours before bedtime.  Avoid lying down right after you eat.  Do not exercise right after you eat. General Instructions  Pay attention to any changes in your symptoms.  Take over-the-counter and prescription medicines only as told by your health care provider. Do not take aspirin, ibuprofen, or other NSAIDs unless your health care provider told you to do so.  Do not use any tobacco products, including cigarettes, chewing tobacco, and e-cigarettes. If you need help quitting, ask your health care provider.  Wear loose-fitting clothing. Do not wear anything tight around your waist that causes pressure on your abdomen.  Raise (elevate) the head of your bed 6 inches (15cm).  Try to reduce your stress, such as with yoga or meditation. If you need help reducing stress, ask your health care provider.  If you are overweight, reduce your weight to an amount that is healthy for you. Ask your health care provider for guidance about a safe weight loss goal.  Keep all follow-up visits as told by your health care provider. This is important. SEEK MEDICAL CARE IF:  You have new symptoms.  You have unexplained weight loss.  You have difficulty swallowing, or it hurts to swallow.  You have wheezing or a persistent cough.  Your symptoms do not improve with treatment.  You have a hoarse voice. SEEK IMMEDIATE MEDICAL CARE IF:  You have  pain in your arms, neck, jaw, teeth, or back.  You feel sweaty, dizzy, or light-headed.  You have chest pain or shortness of breath.  You vomit and your vomit looks like blood or coffee grounds.  You faint.  Your stool is bloody or black.  You cannot swallow, drink, or eat.   This information is not intended to replace advice given to you by your health care provider. Make sure you discuss any questions you have with your health care provider.   Document Released: 04/02/2005 Document Revised: 03/14/2015 Document Reviewed: 10/18/2014 Elsevier Interactive Patient Education [employer]

## 2016-03-11 NOTE — Assessment & Plan Note (Signed)
Check CBC 

## 2016-03-11 NOTE — Assessment & Plan Note (Signed)
Treated by Dr. Servando SnareWohl

## 2016-03-11 NOTE — Assessment & Plan Note (Signed)
Check labs today.

## 2016-03-11 NOTE — Assessment & Plan Note (Signed)
Nodules followed by Dr. Patrecia PaceMorayati

## 2016-03-11 NOTE — Assessment & Plan Note (Signed)
Check B12 and vitamin B1

## 2016-03-11 NOTE — Assessment & Plan Note (Signed)
Discussed risk of PPIs; explained risk of pneumonia, C diff colitis, anemia, osteoporosis, kidney disease, and heart disease; she wants to stay on the Protonix despite the associated risk; check H pylori; avoid triggers; elevate HOB

## 2016-03-14 ENCOUNTER — Telehealth: Payer: Self-pay

## 2016-03-14 NOTE — Telephone Encounter (Signed)
Left message for patient to return my call regarding labs.  

## 2016-03-14 NOTE — Telephone Encounter (Signed)
-----   Message from Kerman PasseyMelinda P Lada, MD sent at 03/13/2016  5:09 PM EDT ----- Please let pt know that her white count and platelet count have come down, which is good news; her glucose is normal; kidney and liver function tests normal; vitamin B1 is normal, still waiting on others

## 2016-03-17 LAB — CBC WITH DIFFERENTIAL/PLATELET
BASOS: 1 %
Basophils Absolute: 0.1 10*3/uL (ref 0.0–0.2)
EOS (ABSOLUTE): 0.4 10*3/uL (ref 0.0–0.4)
Eos: 3 %
HEMOGLOBIN: 12.4 g/dL (ref 11.1–15.9)
Hematocrit: 36.4 % (ref 34.0–46.6)
IMMATURE GRANS (ABS): 0 10*3/uL (ref 0.0–0.1)
Immature Granulocytes: 0 %
LYMPHS ABS: 3.3 10*3/uL — AB (ref 0.7–3.1)
LYMPHS: 30 %
MCH: 28.3 pg (ref 26.6–33.0)
MCHC: 34.1 g/dL (ref 31.5–35.7)
MCV: 83 fL (ref 79–97)
Monocytes Absolute: 0.7 10*3/uL (ref 0.1–0.9)
Monocytes: 6 %
NEUTROS ABS: 6.5 10*3/uL (ref 1.4–7.0)
Neutrophils: 60 %
PLATELETS: 407 10*3/uL — AB (ref 150–379)
RBC: 4.38 x10E6/uL (ref 3.77–5.28)
RDW: 14.4 % (ref 12.3–15.4)
WBC: 10.9 10*3/uL — ABNORMAL HIGH (ref 3.4–10.8)

## 2016-03-17 LAB — COMPREHENSIVE METABOLIC PANEL
A/G RATIO: 1.5 (ref 1.2–2.2)
ALBUMIN: 4.1 g/dL (ref 3.5–5.5)
ALT: 19 IU/L (ref 0–32)
AST: 21 IU/L (ref 0–40)
Alkaline Phosphatase: 96 IU/L (ref 39–117)
BUN / CREAT RATIO: 9 (ref 9–23)
BUN: 7 mg/dL (ref 6–24)
CHLORIDE: 97 mmol/L (ref 96–106)
CO2: 26 mmol/L (ref 18–29)
Calcium: 9.8 mg/dL (ref 8.7–10.2)
Creatinine, Ser: 0.8 mg/dL (ref 0.57–1.00)
GFR calc non Af Amer: 88 mL/min/{1.73_m2} (ref >59)
GFR, EST AFRICAN AMERICAN: 102 mL/min/{1.73_m2} (ref >59)
Globulin, Total: 2.8 g/dL (ref 1.5–4.5)
Glucose: 95 mg/dL (ref 65–99)
POTASSIUM: 4 mmol/L (ref 3.5–5.2)
Sodium: 138 mmol/L (ref 134–144)
TOTAL PROTEIN: 6.9 g/dL (ref 6.0–8.5)

## 2016-03-17 LAB — VITAMIN B3
Nicotinamide: 10.6 ng/mL (ref 5.2–72.1)
Nicotinic Acid: <5 ng/mL (ref 0.0–5.0)

## 2016-03-17 LAB — VITAMIN B1: Thiamine: 178.6 nmol/L (ref 66.5–200.0)

## 2016-03-20 ENCOUNTER — Other Ambulatory Visit: Payer: Self-pay

## 2016-03-20 NOTE — Telephone Encounter (Signed)
zofran denied

## 2016-03-21 ENCOUNTER — Other Ambulatory Visit: Payer: Self-pay | Admitting: Family Medicine

## 2016-03-21 NOTE — Telephone Encounter (Signed)
Denying ibuprofen; patient c/o GERD at last visit Will stop the ibuprofen

## 2016-03-26 ENCOUNTER — Telehealth: Payer: Self-pay | Admitting: Family Medicine

## 2016-03-26 NOTE — Telephone Encounter (Signed)
Please read phone note, pt wants to know why zofran was denied?

## 2016-03-26 NOTE — Telephone Encounter (Signed)
Pt notified and was very upset.

## 2016-03-26 NOTE — Telephone Encounter (Signed)
It can cause heart problems I don't mind using occasionally for a stomach bug, but it's not a good option for chronic use If she has nausea from a medicine, encourage her to talk to the doctor to change or adjust the offending medicine Otherwise, try ginger ale or ginger ale or anti-nausea wrist bands (at many pharmacies)

## 2016-03-28 ENCOUNTER — Ambulatory Visit (HOSPITAL_COMMUNITY)
Admission: RE | Admit: 2016-03-28 | Discharge: 2016-03-28 | Disposition: A | Discharge status: Home / Self Care | Payer: Medicare Other | Source: Ambulatory Visit | Attending: Neurosurgery | Admitting: Neurosurgery

## 2016-03-28 DIAGNOSIS — M50321 Other cervical disc degeneration at C4-C5 level: Secondary | ICD-10-CM

## 2016-03-28 DIAGNOSIS — M542 Cervicalgia: Secondary | ICD-10-CM | POA: Diagnosis present

## 2016-03-28 DIAGNOSIS — Z3202 Encounter for pregnancy test, result negative: Secondary | ICD-10-CM | POA: Insufficient documentation

## 2016-03-28 DIAGNOSIS — M47892 Other spondylosis, cervical region: Secondary | ICD-10-CM | POA: Insufficient documentation

## 2016-03-28 DIAGNOSIS — Z9889 Other specified postprocedural states: Secondary | ICD-10-CM | POA: Diagnosis not present

## 2016-03-28 DIAGNOSIS — M50322 Other cervical disc degeneration at C5-C6 level: Secondary | ICD-10-CM

## 2016-03-28 DIAGNOSIS — Z981 Arthrodesis status: Secondary | ICD-10-CM | POA: Insufficient documentation

## 2016-03-28 DIAGNOSIS — M5031 Other cervical disc degeneration,  high cervical region: Secondary | ICD-10-CM | POA: Insufficient documentation

## 2016-03-28 LAB — PREGNANCY, URINE: PREG TEST UR: NEGATIVE

## 2016-03-28 MED ORDER — IOPAMIDOL (ISOVUE-M 300) INJECTION 61%
15.0000 mL | Freq: Once | INTRAMUSCULAR | Status: AC | PRN
  Administered 2016-03-28: 15 mL via INTRATHECAL

## 2016-03-28 MED ORDER — OXYCODONE HCL 5 MG PO TABS
5.0000 mg | ORAL_TABLET | ORAL | Status: DC | PRN
  Administered 2016-03-28: 10 mg via ORAL

## 2016-03-28 MED ORDER — LIDOCAINE HCL 1 % IJ SOLN
INTRAMUSCULAR | Status: AC
  Filled 2016-03-28: qty 10

## 2016-03-28 MED ORDER — LIDOCAINE HCL (PF) 1 % IJ SOLN
10.0000 mL | Freq: Once | INTRAMUSCULAR | Status: AC
  Administered 2016-03-28: 5 mL via INTRADERMAL

## 2016-03-28 MED ORDER — ONDANSETRON HCL 4 MG/2ML IJ SOLN: 4.0000 mg | Freq: Four times a day (QID) | INTRAMUSCULAR | Status: DC | PRN

## 2016-03-28 MED ORDER — DIAZEPAM 5 MG PO TABS
ORAL_TABLET | ORAL | Status: AC
  Administered 2016-03-28: 10 mg via ORAL
  Filled 2016-03-28: qty 2

## 2016-03-28 MED ORDER — DIAZEPAM 5 MG PO TABS
10.0000 mg | ORAL_TABLET | Freq: Once | ORAL | Status: AC
  Administered 2016-03-28: 10 mg via ORAL

## 2016-03-28 MED ORDER — OXYCODONE HCL 5 MG PO TABS
ORAL_TABLET | ORAL | Status: AC
  Filled 2016-03-28: qty 2

## 2016-03-28 MED ORDER — IOPAMIDOL (ISOVUE-M 300) INJECTION 61%
INTRAMUSCULAR | Status: AC
  Filled 2016-03-28: qty 15

## 2016-03-28 NOTE — Op Note (Signed)
  Cervical Myelogram  PATIENT:  Sabrina Espinoza is a 50 y.o. female with cervical pain and no identifying eitiology on a previous mri  PRE-OPERATIVE DIAGNOSIS:  cervicalgia  POST-OPERATIVE DIAGNOSIS:  cervicalgia  PROCEDURE: cervical Myelogram  SURGEON:  Richanda Darin  ANESTHESIA:   local LOCAL MEDICATIONS USED:  LIDOCAINE  and Amount: 10 ml Procedure Note: Sabrina Espinoza is a 50 y.o. female Was taken to the fluoroscopy suite and  positioned prone on the fluoroscopy table. Her back was prepared and draped in a sterile manner. I infiltrated 10 cc into the lumbar region. I then introduced a spinal needle into the thecal sac at the L3/4 interlaminar space. I infiltrated 10cc of Isovue-M 300 into the thecal sac. Fluoroscopy showed the needle and contrast in the thecal sac. Sabrina Espinoza tolerated the procedure well. she Will be taken to CT for evaluation.     PATIENT DISPOSITION:  Short Stay

## 2016-03-28 NOTE — Discharge Instructions (Signed)
Myelogram and Lumbar Puncture Discharge Instructions ° °1. Go home and rest quietly for the next 24 hours.  It is important to lie flat for the next 24 hours.  Get up only to go to the restroom.  You may lie in the bed or on a couch on your back, your stomach, your left side or your right side.  You may have one pillow under your head.  You may have pillows between your knees while you are on your side or under your knees while you are on your back. ° °2. DO NOT drive today.  Recline the seat as far back as it will go, while still wearing your seat belt, on the way home. ° °3. You may get up to go to the bathroom as needed.  You may sit up for 10 minutes to eat.  You may resume your normal diet and medications unless otherwise indicated. ° °4. The incidence of headache, nausea, or vomiting is about 5% (one in 20 patients).  If you develop a headache, lie flat and drink plenty of fluids until the headache goes away.  Caffeinated beverages may be helpful.  If you develop severe nausea and vomiting or a headache that does not go away with flat bed rest, call Dr Cabbell. ° °5. You may resume normal activities after your 24 hours of bed rest is over; however, do not exert yourself strongly or do any heavy lifting tomorrow. ° °6. Call your physician for a follow-up appointment.  The results of your myelogram will be sent directly to your physician by the following day. ° °7. If you have any questions or if complications develop after you arrive home, please call Dr Cabbell. ° °Discharge instructions have been explained to the patient.  The patient, or the person responsible for the patient, fully understands these instructions. ° ° °

## 2016-03-28 NOTE — Progress Notes (Signed)
Dr Franky Machoabbell notified client wants to leave AMA and per Dr Franky Machoabbell it is ok to be discharged home now

## 2016-04-02 ENCOUNTER — Ambulatory Visit
Admission: RE | Admit: 2016-04-02 | Discharge: 2016-04-02 | Disposition: A | Discharge status: Home / Self Care | Payer: Medicare Other | Source: Ambulatory Visit | Attending: Neurosurgery | Admitting: Neurosurgery

## 2016-04-02 ENCOUNTER — Other Ambulatory Visit: Payer: Self-pay | Admitting: Neurosurgery

## 2016-04-02 DIAGNOSIS — G971 Other reaction to spinal and lumbar puncture: Secondary | ICD-10-CM

## 2016-04-02 MED ORDER — IOPAMIDOL (ISOVUE-M 200) INJECTION 41%
1.0000 mL | Freq: Once | INTRAMUSCULAR | Status: AC
  Administered 2016-04-02: 1 mL via EPIDURAL

## 2016-04-02 NOTE — Discharge Instructions (Signed)

## 2016-04-03 ENCOUNTER — Ambulatory Visit (HOSPITAL_COMMUNITY)
Admission: EM | Admit: 2016-04-03 | Discharge: 2016-04-03 | Disposition: A | Discharge status: Home / Self Care | Payer: Medicare Other | Attending: Emergency Medicine | Admitting: Emergency Medicine

## 2016-04-03 ENCOUNTER — Encounter (HOSPITAL_COMMUNITY): Payer: Self-pay | Admitting: Emergency Medicine

## 2016-04-03 DIAGNOSIS — R39198 Other difficulties with micturition: Secondary | ICD-10-CM | POA: Insufficient documentation

## 2016-04-03 DIAGNOSIS — N390 Urinary tract infection, site not specified: Secondary | ICD-10-CM

## 2016-04-03 LAB — POCT URINALYSIS DIP (DEVICE)
BILIRUBIN URINE: NEGATIVE
Glucose, UA: NEGATIVE mg/dL
KETONES UR: NEGATIVE mg/dL
Nitrite: POSITIVE — AB
PH: 6.5 (ref 5.0–8.0)
PROTEIN: NEGATIVE mg/dL
SPECIFIC GRAVITY, URINE: 1.01 (ref 1.005–1.030)
Urobilinogen, UA: 0.2 mg/dL (ref 0.0–1.0)

## 2016-04-03 LAB — POCT I-STAT, CHEM 8
BUN: 6 mg/dL (ref 6–20)
CHLORIDE: 99 mmol/L — AB (ref 101–111)
CREATININE: 0.9 mg/dL (ref 0.44–1.00)
Calcium, Ion: 1.16 mmol/L (ref 1.15–1.40)
GLUCOSE: 90 mg/dL (ref 65–99)
HEMATOCRIT: 39 % (ref 36.0–46.0)
Hemoglobin: 13.3 g/dL (ref 12.0–15.0)
POTASSIUM: 3.6 mmol/L (ref 3.5–5.1)
Sodium: 139 mmol/L (ref 135–145)
TCO2: 31 mmol/L (ref 0–100)

## 2016-04-03 MED ORDER — NITROFURANTOIN MONOHYD MACRO 100 MG PO CAPS: 100.0000 mg | ORAL_CAPSULE | Freq: Two times a day (BID) | ORAL | 0 refills | Status: DC

## 2016-04-03 NOTE — ED Triage Notes (Signed)
Pt reports she was sent here by Dr. Sueanne Margaritaabbell's office (Neurosurgeon) for a f/u?  Pt voices no concerns at the moment and reports "she was ill when they told her told to come here"  Mother at bedside states pt is forgetting things more often  Had cervical surgery   A&O x4... NAD

## 2016-04-03 NOTE — ED Provider Notes (Signed)
CSN: [id number]     Arrival date & time 04/03/16  1210 History   First MD Initiated Contact with Patient 04/03/16 1333     Chief Complaint  Patient presents with  . Follow-up   (Consider location/radiation/quality/duration/timing/severity/associated sxs/prior Treatment) 50 year old female accompanied by her mother with concern over change in behavior this morning. She was at Dr. Sueanne Margarita office when she appeared "out of it" and felt tired and forgetful. She has chronic pain related to previous injury of her neck. She had a lumbar puncture about 1 week ago and a blood patch performed yesterday at the ER. She is on chronic pain medication, as well as other psychiatric meds and insomnia meds that make her drowsy. However, her mom feels she is slurring her speech more than usual. Dr. Sueanne Margarita office called Dr. Chaney Malling here at [facility]gent [facility] to request an evaluation for any electrolyte abnormality or possible UTI.    The history is provided by the patient.    Past Medical History:  Diagnosis Date  . Chronic constipation    on Linzess, Miralax, Zofran  . Chronic insomnia 11/02/2015  . COPD, moderate (HCC)   . Depression with anxiety    Followed by Dr. Lynett Fish, psychiatrist at [facility] and Wilma Flavin for thrapy  . History of chicken pox   . History of cocaine use   . History of pneumonia   . Hypothyroidism, adult    Working with Dr. Horton Chin, Endocrinology. S/P RAIU and is planning on biopsy  . Migraine without status migrainosus, not intractable   . Nicotine dependence, uncomplicated   . Nontoxic multinodular goiter    FNA done 10/30/14 with benign results. Seen [name]. Patient may want a second opinion.  . Rhinitis, allergic   . Vitamin D deficiency    Past Surgical History:  Procedure Laterality Date  . ANKLE FRACTURE SURGERY  1988   rods and pins in place  . CESAREAN SECTION  1996  . ECTOPIC PREGNANCY SURGERY  2003   Family History  Problem  Relation Age of Onset  . Heart disease Father   . Alcohol abuse Father   . Diabetes Father   . Heart disease Brother   . Depression Brother   . Stroke Brother   . Alcohol abuse Paternal Grandfather   . Breast cancer Neg Hx    Social History  Substance Use Topics  . Smoking status: Current Every Day Smoker    Packs/day: 1.00    Years: 32.00    Types: Cigarettes  . Smokeless tobacco: Never Used  . Alcohol use No   OB History    Gravida Para Term Preterm AB Living   1 1 1     1    SAB TAB Ectopic Multiple Live Births                 Review of Systems  Constitutional: Positive for fatigue. Negative for chills and fever.  Eyes: Negative for visual disturbance.  Gastrointestinal: Negative for abdominal pain, nausea and vomiting.  Genitourinary: Negative for difficulty urinating, dysuria and hematuria.  Musculoskeletal: Positive for arthralgias, back pain, myalgias and neck pain.  Neurological: Positive for dizziness and weakness. Negative for light-headedness and numbness.  Psychiatric/Behavioral: Positive for decreased concentration.    Allergies  Review of patient's allergies indicates no known allergies.  Home Medications   Prior to Admission medications   Medication Sig Start Date End Date Taking? Authorizing Provider  azelastine (ASTELIN) 0.1 % nasal spray Place 1  spray into the nose 2 (two) times daily. Use in each nostril as directed    Yes Historical Provider, MD  budesonide-formoterol (SYMBICORT) 160-4.5 MCG/ACT inhaler Inhale 2 puffs into the lungs 2 (two) times daily. USE 2 PUFFS 2 TIMES A DAY 11/02/15  Yes Kerman Passey, MD  Calcium Carb-Cholecalciferol (CALCIUM CARBONATE-VITAMIN D3) 600-400 MG-UNIT TABS Take 1 tablet by mouth 2 (two) times daily.    Yes Historical Provider, MD  clonazePAM (KLONOPIN) 1 MG tablet Take 1 mg by mouth 3 (three) times daily.  02/09/14  Yes Historical Provider, MD  fluticasone (FLONASE) 50 MCG/ACT nasal spray Place 1 spray into both  nostrils 2 (two) times daily. 12/11/15  Yes Kerman Passey, MD  gabapentin (NEURONTIN) 400 MG capsule Take 400 mg by mouth 3 (three) times daily.  02/09/14  Yes Historical Provider, MD  hydrOXYzine (VISTARIL) 50 MG capsule Take 50 mg by mouth 2 (two) times daily.   Yes Historical Provider, MD  levocetirizine (XYZAL) 5 MG tablet Take 1 tablet (5 mg total) by mouth every evening. 01/31/16  Yes Kerman Passey, MD  montelukast (SINGULAIR) 10 MG tablet TAKE 1 TABLET (10 MG TOTAL) BY MOUTH AT BEDTIME. 01/23/16  Yes Kerman Passey, MD  nortriptyline (PAMELOR) 50 MG capsule Take 100 mg by mouth at bedtime.    Yes Historical Provider, MD  oxyCODONE-acetaminophen (PERCOCET) 7.5-325 MG tablet Take 1 tablet by mouth every 4 (four) hours as needed for severe pain.   Yes Historical Provider, MD  pantoprazole (PROTONIX) 40 MG tablet Take 1 tablet (40 mg total) by mouth daily as needed. Patient taking differently: Take 40 mg by mouth every morning.  03/11/16  Yes Kerman Passey, MD  prazosin (MINIPRESS) 5 MG capsule Take 5 mg by mouth at bedtime.   Yes Historical Provider, MD  rOPINIRole (REQUIP) 2 MG tablet Take 2 mg by mouth 2 (two) times daily. 01/02/15  Yes Historical Provider, MD  zaleplon (SONATA) 10 MG capsule Take 10 mg by mouth at bedtime.  08/02/15  Yes Historical Provider, MD  EPIPEN 2-PAK 0.3 MG/0.3ML SOAJ injection Inject 0.3 mg into the muscle once.  02/23/15   Historical Provider, MD  nitrofurantoin, macrocrystal-monohydrate, (MACROBID) 100 MG capsule Take 1 capsule (100 mg total) by mouth 2 (two) times daily. 04/03/16   Sudie Grumbling, NP  polyethylene glycol powder (GLYCOLAX/MIRALAX) powder Take 17 g by mouth daily as needed for mild constipation.  03/11/15   Historical Provider, MD  promethazine (PHENERGAN) 25 MG tablet TAKE 1 TABLET BY MOUTH EVERY 6 HOURS AS NEEDED Patient taking differently: Take 25 mg by mouth every 6 hours as needed for nausea 03/15/15   Edwena Felty, MD  VENTOLIN HFA 108 (90 BASE)  MCG/ACT inhaler Inhale 1-2 puffs into the lungs every 6 (six) hours as needed for wheezing or shortness of breath. 03/22/15   Edwena Felty, MD   Meds Ordered and Administered this Visit  Medications - No data to display  BP 128/87 (BP Location: Left Arm)   Pulse 98   Temp 98.4 F (36.9 C) (Oral)   Resp 12   LMP  (LMP Unknown) Comment: pt has not had a period in months  SpO2 98%  No data found.   Physical Exam  Constitutional: She is oriented to person, place, and time. She appears well-developed and well-nourished. She appears lethargic.  HENT:  Head: Normocephalic and atraumatic.  Right Ear: Hearing, tympanic membrane, external ear and ear canal normal.  Left Ear: Hearing, tympanic  membrane, external ear and ear canal normal.  Nose: Nose normal.  Mouth/Throat: Uvula is midline, oropharynx is clear and moist and mucous membranes are normal.  Eyes: Conjunctivae and lids are normal. Right eye exhibits abnormal extraocular motion (slowed EOM bilaterally). Left eye exhibits abnormal extraocular motion. Right pupil is round. Right pupil not reactive: slowed reactivity bilaterally. Left pupil is round. Pupils are equal.  Neck: Neck supple. Muscular tenderness present. Decreased range of motion present.  Cardiovascular: Normal rate, regular rhythm and normal heart sounds.   Pulmonary/Chest: Effort normal and breath sounds normal.  Neurological: She is oriented to person, place, and time. She has normal strength and normal reflexes. She appears lethargic. No cranial nerve deficit or sensory deficit. She displays a negative Romberg sign. Gait abnormal.  Skin: Skin is warm and dry.  Psychiatric: She has a normal mood and affect. Judgment and thought content normal. Her speech is slurred. She is slowed.    Urgent Care Course   Clinical Course    Procedures (including critical care time)  Labs Review Labs Reviewed  POCT I-STAT, CHEM 8 - Abnormal; Notable for the following:        Result Value   Chloride 99 (*)    All other components within normal limits  POCT URINALYSIS DIP (DEVICE) - Abnormal; Notable for the following:    Hgb urine dipstick TRACE (*)    Nitrite POSITIVE (*)    Leukocytes, UA LARGE (*)    All other components within normal limits  URINE CULTURE    Imaging Review Dg Epidurography  Result Date: 04/02/2016 CLINICAL DATA:  Postmyelogram headache. FLUOROSCOPY TIME:  Radiation Exposure Index (as provided by the fluoroscopic device): 21.09 microGray*m^2 Fluoroscopy Time (in minutes and seconds):  7 seconds PROCEDURE: LUMBAR EPIDURAL BLOOD PATCH INJECTION After a thorough discussion of risks and benefits of the procedure, written and verbal consent was obtained. Specific risks included puncture of the thecal sac and dura as well as nontherapeutic injection with general risks of bleeding, infection, injury to nerves, blood vessels, and adjacent structures. Verbal consent was obtained by Dr. Mosetta PuttGrady. We discussed the medium to high probability of achievement of therapeutic goal, dependent on volume of infusion. Prior to the procedure, 20 ml of the patient's blood was harvested using stringent sterile technique. An interlaminar approach was performed on the right at L3-4. Under stringent sterile technique, overlying skin was cleansed with betadine soap and anesthetized with 1% lidocaine without epinephrine. A 3.5 inch 20 gauge needle was advanced using loss-of-resistance technique. DIAGNOSTIC EPIDURAL INJECTION Injection of Isovue-M 200 shows a good epidural pattern with spread above and below the level of needle placement, primarily on the side of needle placement. No vascular or subarachnoid opacification is seen. THERAPEUTIC EPIDURAL INJECTION 17 ml of the patient's blood was injected into the epidural space at the site of prior lumbar puncture. IMPRESSION: Technically successful lumbar blood patch at L3-4. Electronically Signed   By: Sebastian AcheAllen  Grady M.D.   On: 04/02/2016  13:44     Visual Acuity Review  Right Eye Distance:   Left Eye Distance:   Bilateral Distance:    Right Eye Near:   Left Eye Near:    Bilateral Near:         MDM   1. UTI (lower urinary tract infection)    Dr. Chaney MallingMortenson reviewed lab results with patient, mom and Dr. Sueanne Margaritaabbell's office. She appears to have a UTI. Recommend start Macrobid twice a day as directed. Also patient took her medication later  in the day than usual yesterday and did not sleep much last night so these factors could also be contributing to her current state and symptoms. Informed patient and mom to call Dr. Sueanne Margarita office for refill on pain medication. Follow-up pending urine culture results or go to ER if symptoms worsen.     Sudie Grumbling, NP 04/04/16 [phone]

## 2016-04-03 NOTE — ED Notes (Signed)
Patient cannot void at this time 

## 2016-04-03 NOTE — Discharge Instructions (Signed)
Start Macrobid twice a day for urinary tract infection. Call Dr. Jackelyn Knifeabell's office for refill on pain medication. Follow-up with him as needed.

## 2016-04-05 LAB — URINE CULTURE

## 2016-04-07 ENCOUNTER — Telehealth (HOSPITAL_COMMUNITY): Payer: Self-pay | Admitting: Emergency Medicine

## 2016-04-07 NOTE — Telephone Encounter (Signed)
-----   Message from Eustace MooreLaura W Murray, MD sent at 04/06/2016  4:52 PM EDT ----- Please let patient know that urine culture was positive for Klebsiella, [facility]  Finish nitrofurantoin. Recheck or followup PCP/Melinda Lada for further evaluation if symptoms persist.  LM

## 2016-04-07 NOTE — Telephone Encounter (Signed)
Called pt and notified of recent lab results Pt ID'd properly... Reports she is still having pain in her neck and shoulder States she is taking Macrobid and will finish today Reports she has a f/u appt on 10/24 w/Dr. Sueanne Margaritaabbell's office Adv pt if sx are not getting better to return or to f/u w/PCP Pt verb understanding.

## 2016-04-09 ENCOUNTER — Other Ambulatory Visit: Payer: Self-pay

## 2016-04-09 DIAGNOSIS — J449 Chronic obstructive pulmonary disease, unspecified: Secondary | ICD-10-CM

## 2016-04-09 DIAGNOSIS — J329 Chronic sinusitis, unspecified: Secondary | ICD-10-CM

## 2016-04-09 DIAGNOSIS — J40 Bronchitis, not specified as acute or chronic: Principal | ICD-10-CM

## 2016-04-09 NOTE — Telephone Encounter (Signed)
Spoke with patient due to her wanting to know if she could switch to another provider in the practice because she was unhappy with Dr. Sherie DonLada due to not getting refills.  I  Informed patient that unfortunately she would be able to see another provider in the practice to due availability.  Patient informed me that she would look for another provider and call us back to let our office know where to transfer her records.  Patient also asked me to cancel her appointment for next week.

## 2016-04-09 NOTE — Telephone Encounter (Signed)
We'll ask pt to get her breathing medicines all from Dr. Belia HemanKasa, her pulmonologist; thank you

## 2016-04-09 NOTE — Telephone Encounter (Signed)
Pt has been notified. Pt became upset about not able to get any prescriptions prescribed   from Dr. Sherie DonLada . Pt ask to speak with the office manager. Call transferred to Print production plannerffice Manager.

## 2016-04-11 ENCOUNTER — Other Ambulatory Visit: Payer: Self-pay

## 2016-04-11 DIAGNOSIS — J40 Bronchitis, not specified as acute or chronic: Principal | ICD-10-CM

## 2016-04-11 DIAGNOSIS — J329 Chronic sinusitis, unspecified: Secondary | ICD-10-CM

## 2016-04-11 DIAGNOSIS — J449 Chronic obstructive pulmonary disease, unspecified: Secondary | ICD-10-CM

## 2016-04-17 ENCOUNTER — Inpatient Hospital Stay: Payer: Medicare Other | Admitting: Anesthesiology

## 2016-04-17 ENCOUNTER — Emergency Department: Payer: Medicare Other

## 2016-04-17 ENCOUNTER — Inpatient Hospital Stay: Payer: Medicare Other

## 2016-04-17 ENCOUNTER — Encounter: Payer: Self-pay | Admitting: Emergency Medicine

## 2016-04-17 ENCOUNTER — Inpatient Hospital Stay
Admission: EM | Admit: 2016-04-17 | Discharge: 2016-04-29 | DRG: 854 | Disposition: A | Discharge status: Skilled Nursing Facility | Payer: Medicare Other | Attending: General Surgery | Admitting: General Surgery

## 2016-04-17 ENCOUNTER — Ambulatory Visit: Payer: Medicare Other | Admitting: Family Medicine

## 2016-04-17 ENCOUNTER — Encounter
Admission: EM | Disposition: A | Discharge status: Skilled Nursing Facility | Payer: Self-pay | Source: Home / Self Care | Attending: General Surgery

## 2016-04-17 ENCOUNTER — Other Ambulatory Visit: Payer: Self-pay | Admitting: Family Medicine

## 2016-04-17 DIAGNOSIS — Z8249 Family history of ischemic heart disease and other diseases of the circulatory system: Secondary | ICD-10-CM

## 2016-04-17 DIAGNOSIS — Q438 Other specified congenital malformations of intestine: Secondary | ICD-10-CM | POA: Diagnosis not present

## 2016-04-17 DIAGNOSIS — Z818 Family history of other mental and behavioral disorders: Secondary | ICD-10-CM

## 2016-04-17 DIAGNOSIS — J449 Chronic obstructive pulmonary disease, unspecified: Secondary | ICD-10-CM | POA: Diagnosis present

## 2016-04-17 DIAGNOSIS — K5720 Diverticulitis of large intestine with perforation and abscess without bleeding: Secondary | ICD-10-CM

## 2016-04-17 DIAGNOSIS — R0602 Shortness of breath: Secondary | ICD-10-CM

## 2016-04-17 DIAGNOSIS — K631 Perforation of intestine (nontraumatic): Secondary | ICD-10-CM

## 2016-04-17 DIAGNOSIS — A419 Sepsis, unspecified organism: Principal | ICD-10-CM | POA: Diagnosis present

## 2016-04-17 DIAGNOSIS — R109 Unspecified abdominal pain: Secondary | ICD-10-CM

## 2016-04-17 DIAGNOSIS — N200 Calculus of kidney: Secondary | ICD-10-CM | POA: Diagnosis present

## 2016-04-17 DIAGNOSIS — F329 Major depressive disorder, single episode, unspecified: Secondary | ICD-10-CM | POA: Diagnosis present

## 2016-04-17 DIAGNOSIS — I517 Cardiomegaly: Secondary | ICD-10-CM | POA: Diagnosis present

## 2016-04-17 DIAGNOSIS — F419 Anxiety disorder, unspecified: Secondary | ICD-10-CM | POA: Diagnosis present

## 2016-04-17 DIAGNOSIS — K5909 Other constipation: Secondary | ICD-10-CM | POA: Diagnosis present

## 2016-04-17 DIAGNOSIS — K760 Fatty (change of) liver, not elsewhere classified: Secondary | ICD-10-CM | POA: Diagnosis present

## 2016-04-17 DIAGNOSIS — E039 Hypothyroidism, unspecified: Secondary | ICD-10-CM | POA: Diagnosis present

## 2016-04-17 DIAGNOSIS — J9589 Other postprocedural complications and disorders of respiratory system, not elsewhere classified: Secondary | ICD-10-CM | POA: Diagnosis present

## 2016-04-17 DIAGNOSIS — J9811 Atelectasis: Secondary | ICD-10-CM | POA: Diagnosis present

## 2016-04-17 DIAGNOSIS — K5732 Diverticulitis of large intestine without perforation or abscess without bleeding: Secondary | ICD-10-CM | POA: Diagnosis present

## 2016-04-17 DIAGNOSIS — Z79899 Other long term (current) drug therapy: Secondary | ICD-10-CM

## 2016-04-17 DIAGNOSIS — E042 Nontoxic multinodular goiter: Secondary | ICD-10-CM | POA: Diagnosis present

## 2016-04-17 DIAGNOSIS — R509 Fever, unspecified: Secondary | ICD-10-CM | POA: Diagnosis not present

## 2016-04-17 DIAGNOSIS — R000 Tachycardia, unspecified: Secondary | ICD-10-CM | POA: Diagnosis not present

## 2016-04-17 DIAGNOSIS — R2681 Unsteadiness on feet: Secondary | ICD-10-CM

## 2016-04-17 DIAGNOSIS — Z833 Family history of diabetes mellitus: Secondary | ICD-10-CM

## 2016-04-17 DIAGNOSIS — Z8701 Personal history of pneumonia (recurrent): Secondary | ICD-10-CM

## 2016-04-17 DIAGNOSIS — F17210 Nicotine dependence, cigarettes, uncomplicated: Secondary | ICD-10-CM | POA: Diagnosis present

## 2016-04-17 DIAGNOSIS — E872 Acidosis: Secondary | ICD-10-CM | POA: Diagnosis present

## 2016-04-17 DIAGNOSIS — K529 Noninfective gastroenteritis and colitis, unspecified: Secondary | ICD-10-CM

## 2016-04-17 DIAGNOSIS — I700 Atherosclerosis of aorta: Secondary | ICD-10-CM | POA: Diagnosis present

## 2016-04-17 DIAGNOSIS — E876 Hypokalemia: Secondary | ICD-10-CM | POA: Diagnosis not present

## 2016-04-17 DIAGNOSIS — R1084 Generalized abdominal pain: Secondary | ICD-10-CM

## 2016-04-17 DIAGNOSIS — Z823 Family history of stroke: Secondary | ICD-10-CM | POA: Diagnosis not present

## 2016-04-17 DIAGNOSIS — Z7951 Long term (current) use of inhaled steroids: Secondary | ICD-10-CM

## 2016-04-17 HISTORY — DX: Major depressive disorder, single episode, unspecified: F32.9

## 2016-04-17 HISTORY — DX: Depression, unspecified: F32.A

## 2016-04-17 HISTORY — PX: COLECTOMY WITH COLOSTOMY CREATION/HARTMANN PROCEDURE: SHX6598

## 2016-04-17 HISTORY — PX: APPENDECTOMY: SHX54

## 2016-04-17 HISTORY — PX: LAPAROTOMY: SHX154

## 2016-04-17 HISTORY — DX: Anxiety disorder, unspecified: F41.9

## 2016-04-17 LAB — URINALYSIS COMPLETE WITH MICROSCOPIC (ARMC ONLY)
Bilirubin Urine: NEGATIVE
GLUCOSE, UA: NEGATIVE mg/dL
Hgb urine dipstick: NEGATIVE
KETONES UR: NEGATIVE mg/dL
LEUKOCYTES UA: NEGATIVE
NITRITE: NEGATIVE
Protein, ur: NEGATIVE mg/dL
SPECIFIC GRAVITY, URINE: 1.013 (ref 1.005–1.030)
pH: 7 (ref 5.0–8.0)

## 2016-04-17 LAB — COMPREHENSIVE METABOLIC PANEL
ALK PHOS: 79 U/L (ref 38–126)
ALT: 18 U/L (ref 14–54)
AST: 28 U/L (ref 15–41)
Albumin: 3.7 g/dL (ref 3.5–5.0)
Anion gap: 10 (ref 5–15)
BUN: 10 mg/dL (ref 6–20)
CALCIUM: 9.9 mg/dL (ref 8.9–10.3)
CO2: 27 mmol/L (ref 22–32)
CREATININE: 1.05 mg/dL — AB (ref 0.44–1.00)
Chloride: 102 mmol/L (ref 101–111)
Glucose, Bld: 136 mg/dL — ABNORMAL HIGH (ref 65–99)
Potassium: 3.4 mmol/L — ABNORMAL LOW (ref 3.5–5.1)
Sodium: 139 mmol/L (ref 135–145)
Total Bilirubin: 0.4 mg/dL (ref 0.3–1.2)
Total Protein: 6.9 g/dL (ref 6.5–8.1)

## 2016-04-17 LAB — CBC WITH DIFFERENTIAL/PLATELET
Basophils Absolute: 0.1 10*3/uL (ref 0–0.1)
Basophils Relative: 1 %
EOS ABS: 0.1 10*3/uL (ref 0–0.7)
EOS PCT: 1 %
HCT: 40.6 % (ref 35.0–47.0)
Hemoglobin: 13.6 g/dL (ref 12.0–16.0)
LYMPHS ABS: 1.9 10*3/uL (ref 1.0–3.6)
LYMPHS PCT: 15 %
MCH: 28.4 pg (ref 26.0–34.0)
MCHC: 33.4 g/dL (ref 32.0–36.0)
MCV: 85 fL (ref 80.0–100.0)
MONO ABS: 0.2 10*3/uL (ref 0.2–0.9)
Monocytes Relative: 2 %
Neutro Abs: 9.9 10*3/uL — ABNORMAL HIGH (ref 1.4–6.5)
Neutrophils Relative %: 81 %
PLATELETS: 429 10*3/uL (ref 150–440)
RBC: 4.78 MIL/uL (ref 3.80–5.20)
RDW: 15 % — AB (ref 11.5–14.5)
WBC: 12.1 10*3/uL — AB (ref 3.6–11.0)

## 2016-04-17 LAB — LIPASE, BLOOD: LIPASE: 15 U/L (ref 11–51)

## 2016-04-17 LAB — BASIC METABOLIC PANEL
Anion gap: 8 (ref 5–15)
BUN: 9 mg/dL (ref 6–20)
CALCIUM: 8.7 mg/dL — AB (ref 8.9–10.3)
CHLORIDE: 104 mmol/L (ref 101–111)
CO2: 22 mmol/L (ref 22–32)
CREATININE: 1.14 mg/dL — AB (ref 0.44–1.00)
GFR calc non Af Amer: 56 mL/min — ABNORMAL LOW (ref >60)
GLUCOSE: 84 mg/dL (ref 65–99)
Potassium: 3.9 mmol/L (ref 3.5–5.1)
Sodium: 134 mmol/L — ABNORMAL LOW (ref 135–145)

## 2016-04-17 LAB — CBC
HEMATOCRIT: 37.4 % (ref 35.0–47.0)
HEMOGLOBIN: 12.7 g/dL (ref 12.0–16.0)
MCH: 28.6 pg (ref 26.0–34.0)
MCHC: 34 g/dL (ref 32.0–36.0)
MCV: 84 fL (ref 80.0–100.0)
Platelets: 330 10*3/uL (ref 150–440)
RBC: 4.45 MIL/uL (ref 3.80–5.20)
RDW: 14.9 % — AB (ref 11.5–14.5)
WBC: 17.1 10*3/uL — ABNORMAL HIGH (ref 3.6–11.0)

## 2016-04-17 LAB — BLOOD GAS, VENOUS
ACID-BASE EXCESS: 6.4 mmol/L — AB (ref 0.0–2.0)
Bicarbonate: 29.4 mmol/L — ABNORMAL HIGH (ref 20.0–28.0)
PATIENT TEMPERATURE: 37
PH VEN: 7.52 — AB (ref 7.250–7.430)
pCO2, Ven: 36 mmHg — ABNORMAL LOW (ref 44.0–60.0)

## 2016-04-17 LAB — URINE DRUG SCREEN, QUALITATIVE (ARMC ONLY)
Amphetamines, Ur Screen: NOT DETECTED
BARBITURATES, UR SCREEN: POSITIVE — AB
BENZODIAZEPINE, UR SCRN: NOT DETECTED
CANNABINOID 50 NG, UR ~~LOC~~: NOT DETECTED
Cocaine Metabolite,Ur ~~LOC~~: NOT DETECTED
MDMA (ECSTASY) UR SCREEN: NOT DETECTED
Methadone Scn, Ur: NOT DETECTED
Opiate, Ur Screen: POSITIVE — AB
PHENCYCLIDINE (PCP) UR S: NOT DETECTED
TRICYCLIC, UR SCREEN: POSITIVE — AB

## 2016-04-17 LAB — LACTIC ACID, PLASMA
Lactic Acid, Venous: 2.4 mmol/L (ref 0.5–1.9)
Lactic Acid, Venous: 2.3 mmol/L (ref 0.5–1.9)

## 2016-04-17 LAB — HCG, QUANTITATIVE, PREGNANCY: HCG, BETA CHAIN, QUANT, S: 1 m[IU]/mL (ref <5)

## 2016-04-17 LAB — TROPONIN I

## 2016-04-17 SURGERY — LAPAROTOMY, EXPLORATORY
Anesthesia: General | Site: Abdomen | Wound class: Dirty or Infected

## 2016-04-17 MED ORDER — ENOXAPARIN SODIUM 40 MG/0.4ML ~~LOC~~ SOLN
40.0000 mg | SUBCUTANEOUS | Status: DC
  Administered 2016-04-18 – 2016-04-28 (×11): 40 mg via SUBCUTANEOUS
  Filled 2016-04-17 (×11): qty 0.4

## 2016-04-17 MED ORDER — LEVOCETIRIZINE DIHYDROCHLORIDE 5 MG PO TABS: 5.0000 mg | ORAL_TABLET | Freq: Every evening | ORAL | Status: DC

## 2016-04-17 MED ORDER — EPINEPHRINE PF 1 MG/10ML IJ SOSY
PREFILLED_SYRINGE | INTRAMUSCULAR | Status: DC | PRN
  Administered 2016-04-17 (×3): 10 ug via INTRAVENOUS
  Administered 2016-04-17: 60 ug via INTRAVENOUS

## 2016-04-17 MED ORDER — HYDROXYZINE HCL 25 MG PO TABS
50.0000 mg | ORAL_TABLET | Freq: Two times a day (BID) | ORAL | Status: DC
  Administered 2016-04-19 – 2016-04-29 (×19): 50 mg via ORAL
  Filled 2016-04-17 (×21): qty 2

## 2016-04-17 MED ORDER — LEVALBUTEROL HCL 1.25 MG/0.5ML IN NEBU: 1.2500 mg | INHALATION_SOLUTION | Freq: Three times a day (TID) | RESPIRATORY_TRACT | Status: DC

## 2016-04-17 MED ORDER — MONTELUKAST SODIUM 10 MG PO TABS
10.0000 mg | ORAL_TABLET | Freq: Every day | ORAL | Status: DC
  Administered 2016-04-19 – 2016-04-28 (×10): 10 mg via ORAL
  Filled 2016-04-17 (×11): qty 1

## 2016-04-17 MED ORDER — OXYCODONE HCL 5 MG PO TABS
5.0000 mg | ORAL_TABLET | ORAL | Status: DC | PRN
  Administered 2016-04-17: 5 mg via ORAL
  Filled 2016-04-17: qty 1

## 2016-04-17 MED ORDER — DIPHENHYDRAMINE HCL 50 MG/ML IJ SOLN: 25.0000 mg | Freq: Four times a day (QID) | INTRAMUSCULAR | Status: DC | PRN

## 2016-04-17 MED ORDER — ROPINIROLE HCL 1 MG PO TABS: 2.0000 mg | ORAL_TABLET | Freq: Two times a day (BID) | ORAL | Status: DC

## 2016-04-17 MED ORDER — DEXAMETHASONE SODIUM PHOSPHATE 10 MG/ML IJ SOLN
INTRAMUSCULAR | Status: DC | PRN
  Administered 2016-04-17: 10 mg via INTRAVENOUS

## 2016-04-17 MED ORDER — LIDOCAINE HCL (CARDIAC) 20 MG/ML IV SOLN
INTRAVENOUS | Status: DC | PRN
  Administered 2016-04-17: 60 mg via INTRAVENOUS

## 2016-04-17 MED ORDER — NORTRIPTYLINE HCL 25 MG PO CAPS: 100.0000 mg | ORAL_CAPSULE | Freq: Every day | ORAL | Status: DC

## 2016-04-17 MED ORDER — IOPAMIDOL (ISOVUE-300) INJECTION 61%
100.0000 mL | Freq: Once | INTRAVENOUS | Status: AC | PRN
  Administered 2016-04-17: 100 mL via INTRAVENOUS

## 2016-04-17 MED ORDER — LACTATED RINGERS IV SOLN
INTRAVENOUS | Status: DC | PRN
  Administered 2016-04-17 (×3): via INTRAVENOUS

## 2016-04-17 MED ORDER — MORPHINE SULFATE (PF) 4 MG/ML IV SOLN
4.0000 mg | INTRAVENOUS | Status: DC | PRN
Start: 2016-04-17 — End: 2016-04-18
  Administered 2016-04-17: 4 mg via INTRAVENOUS

## 2016-04-17 MED ORDER — PIPERACILLIN-TAZOBACTAM 3.375 G IVPB
3.3750 g | Freq: Three times a day (TID) | INTRAVENOUS | Status: DC
  Administered 2016-04-17 – 2016-04-27 (×30): 3.375 g via INTRAVENOUS
  Filled 2016-04-17 (×29): qty 50

## 2016-04-17 MED ORDER — ONDANSETRON HCL 4 MG/2ML IJ SOLN
4.0000 mg | Freq: Four times a day (QID) | INTRAMUSCULAR | Status: DC | PRN
  Administered 2016-04-17: 4 mg via INTRAVENOUS

## 2016-04-17 MED ORDER — POTASSIUM CHLORIDE 10 MEQ/100ML IV SOLN
10.0000 meq | Freq: Once | INTRAVENOUS | Status: AC
  Administered 2016-04-17: 10 meq via INTRAVENOUS
  Filled 2016-04-17: qty 100

## 2016-04-17 MED ORDER — SODIUM CHLORIDE 0.9 % IV SOLN
Freq: Once | INTRAVENOUS | Status: AC
  Administered 2016-04-17: 08:00:00 via INTRAVENOUS

## 2016-04-17 MED ORDER — CLONAZEPAM 1 MG PO TABS
1.0000 mg | ORAL_TABLET | Freq: Three times a day (TID) | ORAL | Status: DC
  Administered 2016-04-17: 1 mg via ORAL
  Filled 2016-04-17: qty 1

## 2016-04-17 MED ORDER — LORATADINE 10 MG PO TABS
10.0000 mg | ORAL_TABLET | Freq: Every evening | ORAL | Status: DC
  Administered 2016-04-17 – 2016-04-28 (×11): 10 mg via ORAL
  Filled 2016-04-17 (×11): qty 1

## 2016-04-17 MED ORDER — EPHEDRINE SULFATE 50 MG/ML IJ SOLN
INTRAMUSCULAR | Status: DC | PRN
  Administered 2016-04-17: 5 mg via INTRAVENOUS
  Administered 2016-04-17: 15 mg via INTRAVENOUS

## 2016-04-17 MED ORDER — ROCURONIUM BROMIDE 100 MG/10ML IV SOLN
INTRAVENOUS | Status: DC | PRN
Start: 2016-04-17 — End: 2016-04-17
  Administered 2016-04-17: 30 mg via INTRAVENOUS
  Administered 2016-04-17 (×2): 10 mg via INTRAVENOUS
  Administered 2016-04-17: 5 mg via INTRAVENOUS
  Administered 2016-04-17: 20 mg via INTRAVENOUS

## 2016-04-17 MED ORDER — LACTATED RINGERS IV SOLN
INTRAVENOUS | Status: DC
  Administered 2016-04-17 (×3): via INTRAVENOUS

## 2016-04-17 MED ORDER — ACETAMINOPHEN 10 MG/ML IV SOLN
INTRAVENOUS | Status: DC | PRN
  Administered 2016-04-17: 1000 mg via INTRAVENOUS

## 2016-04-17 MED ORDER — SUGAMMADEX SODIUM 200 MG/2ML IV SOLN
INTRAVENOUS | Status: DC | PRN
  Administered 2016-04-17: 165.2 mg via INTRAVENOUS

## 2016-04-17 MED ORDER — LEVALBUTEROL HCL 1.25 MG/0.5ML IN NEBU
1.2500 mg | INHALATION_SOLUTION | Freq: Three times a day (TID) | RESPIRATORY_TRACT | Status: DC
  Administered 2016-04-18 – 2016-04-19 (×4): 1.25 mg via RESPIRATORY_TRACT
  Filled 2016-04-17 (×6): qty 0.5

## 2016-04-17 MED ORDER — CHLORHEXIDINE GLUCONATE CLOTH 2 % EX PADS
6.0000 | MEDICATED_PAD | Freq: Once | CUTANEOUS | Status: AC
  Administered 2016-04-17: 6 via TOPICAL

## 2016-04-17 MED ORDER — PIPERACILLIN-TAZOBACTAM 3.375 G IVPB
3.3750 g | Freq: Once | INTRAVENOUS | Status: AC
  Administered 2016-04-17: 3.375 g via INTRAVENOUS
  Filled 2016-04-17: qty 50

## 2016-04-17 MED ORDER — FENTANYL CITRATE (PF) 100 MCG/2ML IJ SOLN
INTRAMUSCULAR | Status: DC | PRN
  Administered 2016-04-17: 50 ug via INTRAVENOUS

## 2016-04-17 MED ORDER — BUPIVACAINE LIPOSOME 1.3 % IJ SUSP
INTRAMUSCULAR | Status: DC | PRN
  Administered 2016-04-17: 20 mL

## 2016-04-17 MED ORDER — SODIUM CHLORIDE 0.9 % IV SOLN: Freq: Once | INTRAVENOUS | Status: DC

## 2016-04-17 MED ORDER — GABAPENTIN 400 MG PO CAPS
400.0000 mg | ORAL_CAPSULE | Freq: Three times a day (TID) | ORAL | Status: DC
  Administered 2016-04-17 – 2016-04-29 (×31): 400 mg via ORAL
  Filled 2016-04-17 (×33): qty 1

## 2016-04-17 MED ORDER — SUCCINYLCHOLINE CHLORIDE 20 MG/ML IJ SOLN
INTRAMUSCULAR | Status: DC | PRN
  Administered 2016-04-17: 120 mg via INTRAVENOUS

## 2016-04-17 MED ORDER — PROPOFOL 10 MG/ML IV BOLUS
INTRAVENOUS | Status: DC | PRN
  Administered 2016-04-17: 150 mg via INTRAVENOUS

## 2016-04-17 MED ORDER — POLYETHYLENE GLYCOL 3350 17 GM/SCOOP PO POWD: 17.0000 g | Freq: Every day | ORAL | 2 refills | Status: DC | PRN

## 2016-04-17 MED ORDER — 0.9 % SODIUM CHLORIDE (POUR BTL) OPTIME
TOPICAL | Status: DC | PRN
  Administered 2016-04-17: 5500 mL

## 2016-04-17 MED ORDER — PRAZOSIN HCL 5 MG PO CAPS
5.0000 mg | ORAL_CAPSULE | Freq: Every day | ORAL | Status: DC
  Filled 2016-04-17: qty 1

## 2016-04-17 MED ORDER — ONDANSETRON HCL 4 MG/2ML IJ SOLN
4.0000 mg | Freq: Once | INTRAMUSCULAR | Status: AC
  Administered 2016-04-17: 4 mg via INTRAVENOUS
  Filled 2016-04-17: qty 2

## 2016-04-17 MED ORDER — SODIUM CHLORIDE 0.9 % IJ SOLN
INTRAMUSCULAR | Status: DC | PRN
  Administered 2016-04-17: 50 mL

## 2016-04-17 MED ORDER — ATROPINE SULFATE 0.4 MG/ML IJ SOLN
INTRAMUSCULAR | Status: DC | PRN
  Administered 2016-04-17: .4 mg via INTRAVENOUS

## 2016-04-17 MED ORDER — ONDANSETRON HCL 4 MG/2ML IJ SOLN
INTRAMUSCULAR | Status: DC | PRN
  Administered 2016-04-17: 4 mg via INTRAVENOUS

## 2016-04-17 MED ORDER — CHLORHEXIDINE GLUCONATE CLOTH 2 % EX PADS: 6.0000 | MEDICATED_PAD | Freq: Once | CUTANEOUS | Status: DC

## 2016-04-17 MED ORDER — FAMOTIDINE IN NACL 20-0.9 MG/50ML-% IV SOLN
20.0000 mg | Freq: Two times a day (BID) | INTRAVENOUS | Status: DC
  Administered 2016-04-17 – 2016-04-22 (×11): 20 mg via INTRAVENOUS
  Filled 2016-04-17 (×14): qty 50

## 2016-04-17 MED ORDER — SODIUM CHLORIDE 0.9 % IV SOLN
Freq: Once | INTRAVENOUS | Status: AC
  Administered 2016-04-17: 10:00:00 via INTRAVENOUS

## 2016-04-17 MED ORDER — MORPHINE SULFATE (PF) 4 MG/ML IV SOLN
INTRAVENOUS | Status: AC
  Administered 2016-04-17: 4 mg via INTRAVENOUS
  Filled 2016-04-17: qty 1

## 2016-04-17 MED ORDER — BUPIVACAINE-EPINEPHRINE (PF) 0.25% -1:200000 IJ SOLN
INTRAMUSCULAR | Status: DC | PRN
  Administered 2016-04-17: 30 mL

## 2016-04-17 MED ORDER — KETOROLAC TROMETHAMINE 30 MG/ML IJ SOLN
INTRAMUSCULAR | Status: DC | PRN
  Administered 2016-04-17: 30 mg via INTRAVENOUS

## 2016-04-17 MED ORDER — PHENYLEPHRINE HCL 10 MG/ML IJ SOLN
INTRAMUSCULAR | Status: DC | PRN
  Administered 2016-04-17 (×9): 100 ug via INTRAVENOUS

## 2016-04-17 MED ORDER — LAMOTRIGINE 100 MG PO TABS
300.0000 mg | ORAL_TABLET | Freq: Every day | ORAL | Status: DC
  Administered 2016-04-17: 300 mg via ORAL
  Filled 2016-04-17: qty 3
  Filled 2016-04-17: qty 2

## 2016-04-17 MED ORDER — MORPHINE SULFATE (PF) 4 MG/ML IV SOLN
4.0000 mg | Freq: Once | INTRAVENOUS | Status: AC
  Administered 2016-04-17: 4 mg via INTRAVENOUS
  Filled 2016-04-17: qty 1

## 2016-04-17 MED ORDER — ACETAMINOPHEN 10 MG/ML IV SOLN
1000.0000 mg | Freq: Four times a day (QID) | INTRAVENOUS | Status: AC
  Administered 2016-04-17 – 2016-04-18 (×3): 1000 mg via INTRAVENOUS
  Filled 2016-04-17 (×4): qty 100

## 2016-04-17 MED ORDER — HYDROMORPHONE HCL 1 MG/ML IJ SOLN
0.5000 mg | Freq: Once | INTRAMUSCULAR | Status: AC
  Administered 2016-04-17: 0.5 mg via INTRAVENOUS
  Filled 2016-04-17: qty 1

## 2016-04-17 MED ORDER — DIPHENHYDRAMINE HCL 25 MG PO CAPS: 25.0000 mg | ORAL_CAPSULE | Freq: Four times a day (QID) | ORAL | Status: DC | PRN

## 2016-04-17 MED ORDER — HYDRALAZINE HCL 20 MG/ML IJ SOLN: 10.0000 mg | INTRAMUSCULAR | Status: DC | PRN

## 2016-04-17 MED ORDER — LORAZEPAM 2 MG/ML IJ SOLN
1.0000 mg | Freq: Once | INTRAMUSCULAR | Status: AC
  Administered 2016-04-17: 1 mg via INTRAVENOUS
  Filled 2016-04-17: qty 1

## 2016-04-17 MED ORDER — MIDAZOLAM HCL 2 MG/2ML IJ SOLN
INTRAMUSCULAR | Status: DC | PRN
  Administered 2016-04-17 (×2): 1 mg via INTRAVENOUS

## 2016-04-17 MED ORDER — IOPAMIDOL (ISOVUE-300) INJECTION 61%
30.0000 mL | Freq: Once | INTRAVENOUS | Status: AC | PRN
  Administered 2016-04-17: 30 mL via ORAL

## 2016-04-17 MED ORDER — ONDANSETRON 4 MG PO TBDP: 4.0000 mg | ORAL_TABLET | Freq: Four times a day (QID) | ORAL | Status: DC | PRN

## 2016-04-17 MED ORDER — ONDANSETRON HCL 4 MG/2ML IJ SOLN
INTRAMUSCULAR | Status: AC
  Administered 2016-04-17: 4 mg via INTRAVENOUS
  Filled 2016-04-17: qty 2

## 2016-04-17 MED ORDER — MOMETASONE FURO-FORMOTEROL FUM 200-5 MCG/ACT IN AERO
2.0000 | INHALATION_SPRAY | Freq: Two times a day (BID) | RESPIRATORY_TRACT | Status: DC
  Administered 2016-04-18 – 2016-04-29 (×20): 2 via RESPIRATORY_TRACT
  Filled 2016-04-17: qty 8.8

## 2016-04-17 MED ORDER — NICOTINE 21 MG/24HR TD PT24
21.0000 mg | MEDICATED_PATCH | Freq: Every day | TRANSDERMAL | Status: DC
  Administered 2016-04-17 – 2016-04-29 (×13): 21 mg via TRANSDERMAL
  Filled 2016-04-17 (×13): qty 1

## 2016-04-17 MED ORDER — FLEET ENEMA 7-19 GM/118ML RE ENEM: 1.0000 | ENEMA | Freq: Once | RECTAL | Status: DC | PRN

## 2016-04-17 SURGICAL SUPPLY — 67 items
ADHESIVE MASTISOL STRL (MISCELLANEOUS) ×12 IMPLANT
APPLIER CLIP 11 MED OPEN (CLIP)
APPLIER CLIP 13 LRG OPEN (CLIP)
BARRIER ADH SEPRAFILM 3INX5IN (MISCELLANEOUS) IMPLANT
BLADE CLIPPER SURG (BLADE) IMPLANT
BLADE SURG 15 STRL LF DISP TIS (BLADE) ×2 IMPLANT
BLADE SURG 15 STRL SS (BLADE) ×2
BULB RESERV EVAC DRAIN JP 100C (MISCELLANEOUS) ×8 IMPLANT
CANISTER SUCT 1200ML W/VALVE (MISCELLANEOUS) IMPLANT
CANISTER SUCT 3000ML (MISCELLANEOUS) ×4 IMPLANT
CATH TRAY 16F METER LATEX (MISCELLANEOUS) IMPLANT
CHLORAPREP W/TINT 26ML (MISCELLANEOUS) ×4 IMPLANT
CLIP APPLIE 11 MED OPEN (CLIP) IMPLANT
CLIP APPLIE 13 LRG OPEN (CLIP) IMPLANT
DRAIN CHANNEL JP 19F (MISCELLANEOUS) ×8 IMPLANT
DRAPE LAPAROTOMY 100X77 ABD (DRAPES) ×4 IMPLANT
DRAPE TABLE BACK 80X90 (DRAPES) ×4 IMPLANT
DRSG TEGADERM 2-3/8X2-3/4 SM (GAUZE/BANDAGES/DRESSINGS) IMPLANT
DRSG TELFA 3X8 NADH (GAUZE/BANDAGES/DRESSINGS) IMPLANT
ELECT BLADE 6.5 EXT (BLADE) ×4 IMPLANT
ELECT EZSTD 165MM 6.5IN (MISCELLANEOUS)
ELECT REM PT RETURN 9FT ADLT (ELECTROSURGICAL) ×4
ELECTRODE EZSTD 165MM 6.5IN (MISCELLANEOUS) IMPLANT
ELECTRODE REM PT RTRN 9FT ADLT (ELECTROSURGICAL) ×2 IMPLANT
GAUZE SPONGE 4X4 12PLY STRL (GAUZE/BANDAGES/DRESSINGS) IMPLANT
GLOVE BIO SURGEON STRL SZ7 (GLOVE) ×48 IMPLANT
GOWN STRL REUS W/ TWL LRG LVL3 (GOWN DISPOSABLE) ×6 IMPLANT
GOWN STRL REUS W/TWL LRG LVL3 (GOWN DISPOSABLE) ×6
HANDLE SUCTION POOLE (INSTRUMENTS) ×2 IMPLANT
HANDLE YANKAUER SUCT BULB TIP (MISCELLANEOUS) ×4 IMPLANT
KIT OSTOMY 2 PC DRNBL 2.25 STR (WOUND CARE) ×2 IMPLANT
KIT OSTOMY DRAINABLE 2.25 STR (WOUND CARE) ×2
KIT RM TURNOVER STRD PROC AR (KITS) ×4 IMPLANT
LABEL OR SOLS (LABEL) ×4 IMPLANT
LIGASURE IMPACT 36 18CM CVD LR (INSTRUMENTS) ×4 IMPLANT
NDL SAFETY 22GX1.5 (NEEDLE) IMPLANT
NEEDLE HYPO 22GX1.5 SAFETY (NEEDLE) IMPLANT
NEEDLE HYPO 25X1 1.5 SAFETY (NEEDLE) ×4 IMPLANT
NS IRRIG 1000ML POUR BTL (IV SOLUTION) ×4 IMPLANT
PACK BASIN MAJOR ARMC (MISCELLANEOUS) ×4 IMPLANT
PACK COLON CLEAN CLOSURE (MISCELLANEOUS) IMPLANT
RELOAD PROXIMATE 75MM BLUE (ENDOMECHANICALS) ×16 IMPLANT
RETAINER VISCERA MED (MISCELLANEOUS) ×4 IMPLANT
SPONGE LAP 18X18 5 PK (GAUZE/BANDAGES/DRESSINGS) ×32 IMPLANT
SPONGE LAP 18X36 2PK (MISCELLANEOUS) IMPLANT
STAPLER CUT CVD 40MM BLUE (STAPLE) IMPLANT
STAPLER PROXIMATE 75MM BLUE (STAPLE) ×4 IMPLANT
STAPLER SKIN PROX 35W (STAPLE) IMPLANT
SUCTION POOLE HANDLE (INSTRUMENTS) ×4
SUT PDS AB 0 CT1 27 (SUTURE) ×16 IMPLANT
SUT PDS AB 1 TP1 96 (SUTURE) IMPLANT
SUT SILK 2 0 (SUTURE) ×2
SUT SILK 2 0 SH CR/8 (SUTURE) ×8 IMPLANT
SUT SILK 2 0SH CR/8 30 (SUTURE) IMPLANT
SUT SILK 2-0 18XBRD TIE 12 (SUTURE) ×2 IMPLANT
SUT VIC AB 0 CT1 36 (SUTURE) IMPLANT
SUT VIC AB 2-0 SH 27 (SUTURE)
SUT VIC AB 2-0 SH 27XBRD (SUTURE) IMPLANT
SUT VIC AB 3-0 SH 27 (SUTURE)
SUT VIC AB 3-0 SH 27X BRD (SUTURE) IMPLANT
SUT VIC AB 3-0 SH 8-18 (SUTURE) ×4 IMPLANT
SYR 20CC LL (SYRINGE) ×4 IMPLANT
SYR 30ML LL (SYRINGE) ×8 IMPLANT
SYR 3ML LL SCALE MARK (SYRINGE) IMPLANT
TAPE MICROFOAM 4IN (TAPE) IMPLANT
TOWEL OR 17X26 4PK STRL BLUE (TOWEL DISPOSABLE) ×4 IMPLANT
TRAY FOLEY W/METER SILVER 16FR (SET/KITS/TRAYS/PACK) ×4 IMPLANT

## 2016-04-17 NOTE — ED Notes (Signed)
Pt using callbell; requesting warm blanket and a bed pan; pt says she cannot get up to the toilet in the room with assistance because "I don't feel like it"; pt encouraged to call for help with bedpan;

## 2016-04-17 NOTE — Progress Notes (Signed)
Update H/P Pt seen and examined Worsening abdominal pain D/W mother who lives with her and now she is starting to have some mental status changes , worsening pain Febrile to 103 and tachycardic to 130 On exam she is moaning in pain  Abd: distended, rebound and peritoneal signs with tympany  A./P worsening abdominal exam with early signs of sepsis concerned for worsening perforation D/W the pt and family in detail about my concerns and I do recommend emergent laparotomy It is a difficult situation since she has a psych hx but she seems to understands. D/W her in detail about risks, benefits and possible complications ( including but not limited to death, re interventions, pulm complications, prolonged hospitalization and pain) Plan for Lap and Hartmann's

## 2016-04-17 NOTE — OR Nursing (Signed)
Dr. Noralyn Pickarroll notified no urine pregnancy preformed. No hysterectomy, no recent menstrual periods, MD acknowledge, states since emergency case, it is okay to proceed with case as scheduled.

## 2016-04-17 NOTE — Plan of Care (Signed)
Problem: Pain Managment: Goal: General experience of comfort will improve Outcome: Not Progressing MD aware, patient may go to surgery tonight

## 2016-04-17 NOTE — ED Provider Notes (Signed)
IMPRESSION: Circumferential wall thickening of the sigmoid colon with several foci of pneumoperitoneum - likely sigmoid colitis with bowel perforation. Small amount of free fluid within the abdomen and pelvis. No evidence of small bowel obstruction.  Hepatic steatosis.  Cardiomegaly and bibasilar atelectasis.  Abdominal aortic atherosclerosis.  Punctate nonobstructing left upper pole renal calculus.  Critical Value/emergent results were called by telephone at the time of interpretation on 04/17/2016 at 9:09 am to Dr. Mayford KnifeWilliams, who verbally acknowledged these results.   Likely colitis with bowel perforation. I have consult to general surgery for evaluation and admission.   Emily FilbertJonathan E Williams, MD 04/17/16 [phone]

## 2016-04-17 NOTE — Op Note (Signed)
PROCEDURES:  1. Open sigmoid colectomy with end colostomy. Hartmann's Procedure. 2. Open drainage of Pelvic abscess   3. Appendectomy 4. Placement of Pelvic drains x 2  ( 19 FR)  Pre-operative Diagnosis: Perforated Diverticulitis with sepsis  Post-operative Diagnosis: Same  Surgeon: Merri Rayiego F Braylie Badami   Anesthesia: General endotracheal anesthesia  ASA Class: 4  Surgeon: Sterling Bigiego Najee Manninen , MD FACS  Anesthesia: Gen. with endotracheal tube  Findings: Perforated Diverticulitis with purulent peritonitis                   Pelvic Abscess                  Redundant transverse and sigmoid colon                  Constipation  Estimated Blood Loss: 100cc         Drains: 19 Fr x 2         Specimens: Sigmoid colon       Complications: none               Condition: stable  Procedure Details  The patient was seen again in the Holding Room. The benefits, complications, treatment options, and expected outcomes were discussed with the patient. The risks of bleeding, infection, recurrence of symptoms, failure to resolve symptoms,  bowel injury, any of which could require further surgery were reviewed with the patient.   The patient was taken to Operating Room, identified as Sabrina Espinoza and the procedure verified.  A Time Out was held and the above information confirmed.  Prior to the induction of general anesthesia, antibiotic prophylaxis was administered. VTE prophylaxis was in place. General endotracheal anesthesia was then administered and tolerated well. After the induction, the abdomen was prepped with Chloraprep and draped in the sterile fashion. The patient was positioned in the supine position.  Midline laparotomy  incision was created with a 10 blade knife. The abdominal cavity was entered under direct visualization and significant amount of purulent material was encountered. Bile for retractor was placed and the small bowel was retracted with laparotomies in the standard fashion. The white  line of Toldt was incised and a lateral to medial dissection was performed. The area of perforation was identified and quickly controlled there was stool eroding through the mucosa of the sigmoid colon. We were able to transect proximally the descending colon after incising the mesentery with electrocautery. We divided the colon with a standard and 75 GIA stapler. We then used this as a handle and were able to score the mesentery. Using the LigaSure device the mesentery was divided and the left colic artery and vein where clamp and a suture nature was placed in the standard fashion. Our distal and of dissection was the junction of the sigmoid colon and rectum. And we were able to divide this area with a 75 GIA stapler in the standard fashion. Were able to identify the ureter and we preserved this. It was also a pelvic abscesses that were able to drain and when finger fracturing some of the adhesions down in the pelvis. After this was performed the abdominal cavity was irrigated profusely with several liters of normal saline. Attention then was turned to the cecum and we performed an appendectomy after dividing the meso appendix with a LigaSure device and the stapler of the appendix with a GIA stapler. The small bowel was run from the ligament of Treitz all the way to the terminal ileum without any  major lesions. A defect was created in the left lower quadrant to create an end colostomy. Using electrocautery we were able to incise the fascia and were able to place 3 fingers through the abdominal wall. I using a Tanja Port were able to deliver the colon to create a colostomy in the standard fashion. Liposomal Marcaine was injected on all incision sites under direct visualization. Fascia was closed using continuous 0 PDS sutures and were able to place some internal retention sutures about 6 of them with PDS. Given the degree of contamination I packed the midline wound with a Kerlix and a sterile dressing. The  colostomy was matured using 3-0 Vicryl in the standard fashion. There was a good healthy colostomy mucosa and it was widely patent. Needles and laparotomy count were correct and there were no immediate complications  Sterling Big, MD, FACS

## 2016-04-17 NOTE — ED Notes (Signed)
Pt now resting more comfortably after Ativan; sats 88% on room air, placed on oxygen at 2L via [place]; side rails up x 2 with callbell in reach;

## 2016-04-17 NOTE — ED Notes (Signed)
Pts mother "Junious DresserConnie" [phone]

## 2016-04-17 NOTE — ED Notes (Signed)
Pt ambulated to toilet with assistance; no urine passed

## 2016-04-17 NOTE — ED Notes (Signed)
MD at bedside. 

## 2016-04-17 NOTE — Progress Notes (Signed)
Request received for miralax; rx approved; pt currently hospitalized

## 2016-04-17 NOTE — H&P (Signed)
Patient ID: Sabrina Espinoza, female   DOB: 1965/10/23, 50 y.o.   MRN: [id number]  CC: ABDOMINAL PAIN  HPI Sabrina Espinoza is a 50 y.o. female presents to the ER today for evaluation of abdominal pain. Patient reports that she's had abdominal pain for approximately the last month however started getting worse 3 days ago which prompted her to come to the emergency department today. Patient is a very poor historian. She denies any fevers, chills, chest pain, shortness of breath. She states she's had some nausea but thinks this is been chronic in nature. She cannot tell me anything that makes the pain better or worse only that it is there. She also states she's not had a bowel movement for quite some time but is uncertain as to exactly how long dispense since her last bowel movement. She does not cooperate well with the exam or the history.  HPI  Past Medical History:  Diagnosis Date  . Chronic constipation    on Linzess, Miralax, Zofran  . Chronic insomnia 11/02/2015  . COPD, moderate (HCC)   . Depression with anxiety    Followed by Dr. Lynett Fish, psychiatrist at [facility] and Wilma Flavin for thrapy  . History of chicken pox   . History of cocaine use   . History of pneumonia   . Hypothyroidism, adult    Working with Dr. Horton Chin, Endocrinology. S/P RAIU and is planning on biopsy  . Migraine without status migrainosus, not intractable   . Nicotine dependence, uncomplicated   . Nontoxic multinodular goiter    FNA done 10/30/14 with benign results. Seen [name]. Patient may want a second opinion.  . Rhinitis, allergic   . Vitamin D deficiency     Past Surgical History:  Procedure Laterality Date  . ANKLE FRACTURE SURGERY  1988   rods and pins in place  . CESAREAN SECTION  1996  . ECTOPIC PREGNANCY SURGERY  2003    Family History  Problem Relation Age of Onset  . Heart disease Father   . Alcohol abuse Father   . Diabetes Father   . Heart disease Brother    . Depression Brother   . Stroke Brother   . Alcohol abuse Paternal Grandfather   . Breast cancer Neg Hx     Social History Social History  Substance Use Topics  . Smoking status: Current Every Day Smoker    Packs/day: 1.00    Years: 32.00    Types: Cigarettes  . Smokeless tobacco: Never Used  . Alcohol use No    No Known Allergies  Current Facility-Administered Medications  Medication Dose Route Frequency Provider Last Rate Last Dose  . 0.9 %  sodium chloride infusion   Intravenous Once Arnaldo Natal, MD       Current Outpatient Prescriptions  Medication Sig Dispense Refill  . azelastine (ASTELIN) 0.1 % nasal spray Place 1 spray into the nose 2 (two) times daily. Use in each nostril as directed     . budesonide-formoterol (SYMBICORT) 160-4.5 MCG/ACT inhaler Inhale 2 puffs into the lungs 2 (two) times daily. USE 2 PUFFS 2 TIMES A DAY 10.2 Inhaler 2  . butalbital-acetaminophen-caffeine (FIORICET, ESGIC) 50-325-40 MG tablet Take 2 tablets by mouth every 6 (six) hours as needed for headache.    . ciprofloxacin (CIPRO) 250 MG tablet Take 250 mg by mouth 2 (two) times daily.    . clonazePAM (KLONOPIN) 1 MG tablet Take 1 mg by mouth 3 (three) times daily.     [name]  fluticasone (FLONASE) 50 MCG/ACT nasal spray Place 1 spray into both nostrils 2 (two) times daily. 16 g 11  . gabapentin (NEURONTIN) 400 MG capsule Take 400 mg by mouth 3 (three) times daily.     . hydrOXYzine (VISTARIL) 50 MG capsule Take 50 mg by mouth 2 (two) times daily.    [name] lamoTRIgine (LAMICTAL) 150 MG tablet Take 300 mg by mouth daily.    [name] levocetirizine (XYZAL) 5 MG tablet Take 1 tablet (5 mg total) by mouth every evening. 30 tablet 5  . montelukast (SINGULAIR) 10 MG tablet TAKE 1 TABLET (10 MG TOTAL) BY MOUTH AT BEDTIME. 30 tablet 6  . ondansetron (ZOFRAN) 4 MG tablet Take 5.5 mg by mouth daily.    [name] oxyCODONE-acetaminophen (PERCOCET/ROXICET) 5-325 MG tablet Take 1 tablet by mouth every 6 (six) hours as needed for  severe pain.    . pantoprazole (PROTONIX) 40 MG tablet Take 1 tablet (40 mg total) by mouth daily as needed. (Patient taking differently: Take 40 mg by mouth every morning. ) 30 tablet 2  . polyethylene glycol powder (GLYCOLAX/MIRALAX) powder Take 17 g by mouth daily as needed for mild constipation.     . prazosin (MINIPRESS) 5 MG capsule Take 5 mg by mouth at bedtime.    . promethazine (PHENERGAN) 25 MG tablet TAKE 1 TABLET BY MOUTH EVERY 6 HOURS AS NEEDED (Patient taking differently: Take 25 mg by mouth every 6 hours as needed for nausea) 40 tablet 5  . rOPINIRole (REQUIP) 2 MG tablet Take 2 mg by mouth 2 (two) times daily.  4  . VENTOLIN HFA 108 (90 BASE) MCG/ACT inhaler Inhale 1-2 puffs into the lungs every 6 (six) hours as needed for wheezing or shortness of breath. 8 g 5  . zaleplon (SONATA) 10 MG capsule Take 10 mg by mouth at bedtime.     . Calcium Carb-Cholecalciferol (CALCIUM CARBONATE-VITAMIN D3) 600-400 MG-UNIT TABS Take 1 tablet by mouth 2 (two) times daily.     [name] EPIPEN 2-PAK 0.3 MG/0.3ML SOAJ injection Inject 0.3 mg into the muscle once.     . nitrofurantoin, macrocrystal-monohydrate, (MACROBID) 100 MG capsule Take 1 capsule (100 mg total) by mouth 2 (two) times daily. (Patient not taking: Reported on 04/17/2016) 10 capsule 0  . nortriptyline (PAMELOR) 50 MG capsule Take 100 mg by mouth at bedtime.        Review of Systems A Multi-point review of systems was asked and was negative except for the findings described in the history of present illness  Physical Exam Blood pressure 118/78, pulse (!) 130, temperature 98.3 F (36.8 C), temperature source Oral, resp. rate (!) 34, height 5\' 9"  (1.753 m), weight 82.6 kg (182 lb), SpO2 96 %. CONSTITUTIONAL: Resting in bed in no obvious distress. EYES: Pupils are equal, round, and reactive to light, Sclera are non-icteric. EARS, NOSE, MOUTH AND THROAT: The oropharynx is clear. The oral mucosa is pink and moist. Hearing is intact to voice.  Poor dentition LYMPH NODES:  Lymph nodes in the neck are normal. RESPIRATORY:  Lungs are clear. There is normal respiratory effort, with equal breath sounds bilaterally, and without pathologic use of accessory muscles. CARDIOVASCULAR: Heart is regular without murmurs, gallops, or rubs. GI: The abdomen is soft, tender to palpation in all quadrants worst in the left lower quadrant, and mildly distended. There are no palpable masses. There is no hepatosplenomegaly. There are normal bowel sounds in all quadrants. GU: Rectal deferred.   MUSCULOSKELETAL: Normal muscle strength and tone.  No cyanosis or edema.   SKIN: Turgor is good and there are no pathologic skin lesions or ulcers. NEUROLOGIC: Motor and sensation is grossly normal. Cranial nerves are grossly intact. PSYCH:  Oriented to person, place and time. Affect is normal.  Data Reviewed Images and labs reviewed. Labs show a mild leukocytosis of 12.1, a mild lactic acidosis of 2.4, CT scan of the abdomen does appear to show a large stool burden with some free fluid in the pelvis and evidence of inflammation consistent with sigmoid diverticulitis versus colitis. There are some small foci of free air outside of the colon that appears to be contained in the mesentery of the sigmoid colon. I have personally reviewed the patient's imaging, laboratory findings and medical records.    Assessment    Contained, perforated sigmoid diverticulitis    Plan    50 year old female with likely contained perforated sigmoid diverticula as. Discussed with the patient the plan to admit her to the hospital for IV fluids, IV antibiotics and bowel rest. I'm unsure if she understood the plan or not. Discussed with her at length that sometimes this requires surgical intervention but there is no plans for surgery at this time. I will consult internal medicine for assistance with her multiple other medical problems while she is an inpatient.     Time spent with the  patient was 60 minutes, with more than 50% of the time spent in face-to-face education, counseling and care coordination.     Ricarda Frameharles Damarko Stitely, MD FACS General Surgeon 04/17/2016, 11:59 AM

## 2016-04-17 NOTE — Anesthesia Procedure Notes (Addendum)
Procedure Name: Intubation Performed by: Lynnwood Beckford Pre-anesthesia Checklist: Patient identified, Patient being monitored, Timeout performed, Emergency Drugs available and Suction available Patient Re-evaluated:Patient Re-evaluated prior to inductionOxygen Delivery Method: Circle system utilized Preoxygenation: Pre-oxygenation with 100% oxygen Intubation Type: IV induction, Cricoid Pressure applied and Rapid sequence Laryngoscope Size: Mac and 3 Grade View: Grade I Tube type: Oral Tube size: 7.0 mm Number of attempts: 1 Airway Equipment and Method: Stylet Placement Confirmation: ETT inserted through vocal cords under direct vision,  positive ETCO2 and breath sounds checked- equal and bilateral Secured at: 21 cm Tube secured with: Tape Dental Injury: Teeth and Oropharynx as per pre-operative assessment      

## 2016-04-17 NOTE — Consult Note (Signed)
[employer] - [place] [facility]   PATIENT NAME: Sabrina Espinoza    MR#:  [id number]  DATE OF BIRTH:  10-23-1965  DATE OF ADMISSION:  04/17/2016  PRIMARY CARE PHYSICIAN: Baruch Gouty, MD   REQUESTING/REFERRING PHYSICIAN: Ricarda Frame  CHIEF COMPLAINT:   Chief Complaint  Patient presents with  . Abdominal Pain  . Constipation    HISTORY OF PRESENT ILLNESS:  Sabrina Espinoza  is a 50 y.o. female presents to the hospital with 2 weeks worth of abdominal pain and not having a bowel movement in a while. The patient is a poor historian secondary to pain. She has nausea severe lower abdominal pain 10 out of 10 intensity. Nothing makes it better. Has already worse. In the ER, she was found to have thickening of the sigmoid colon with several foci of pneumoperitoneum and a small amount of free fluid in the abdomen seen on CAT scan. The patient was admitted to the surgical service. Medicine consultation ordered for numerous medical issues.  PAST MEDICAL HISTORY:   Past Medical History:  Diagnosis Date  . Anxiety   . Chronic constipation    on Linzess, Miralax, Zofran  . Chronic insomnia 11/02/2015  . COPD, moderate (HCC)   . Depression   . Depression with anxiety    Followed by Dr. Lynett Fish, psychiatrist at [facility] and Wilma Flavin for thrapy  . History of chicken pox   . History of cocaine use   . History of pneumonia   . Hypothyroidism, adult    Working with Dr. Horton Chin, Endocrinology. S/P RAIU and is planning on biopsy  . Migraine without status migrainosus, not intractable   . Nicotine dependence, uncomplicated   . Nontoxic multinodular goiter    FNA done 10/30/14 with benign results. Seen [name]. Patient may want a second opinion.  . Rhinitis, allergic   . Vitamin D deficiency     PAST SURGICAL HISTOIRY:   Past Surgical History:  Procedure Laterality Date  . ANKLE FRACTURE SURGERY  1988   rods and pins in place  .  CESAREAN SECTION  1996  . ECTOPIC PREGNANCY SURGERY  2003  . NECK SURGERY    . SHOULDER SURGERY      SOCIAL HISTORY:   Social History  Substance Use Topics  . Smoking status: Current Every Day Smoker    Packs/day: 0.75    Years: 32.00    Types: Cigarettes  . Smokeless tobacco: Never Used  . Alcohol use No    FAMILY HISTORY:   Family History  Problem Relation Age of Onset  . Heart disease Father   . Alcohol abuse Father   . Diabetes Father   . Heart disease Brother   . Depression Brother   . Stroke Brother   . Alcohol abuse Paternal Grandfather   . Osteoporosis Mother   . Breast cancer Neg Hx     DRUG ALLERGIES:  No Known Allergies  REVIEW OF SYSTEMS:  CONSTITUTIONAL: Positive for fever, chills and sweats. Positive for weight gain.  EYES: No blurred or double vision. Wears reading glasses. EARS, NOSE, AND THROAT: No tinnitus or ear pain. Decreased hearing as per patient. Dry mouth. RESPIRATORY: Positive for cough and shortness of breath. No wheezing or hemoptysis.  CARDIOVASCULAR: No chest pain, orthopnea, edema.  GASTROINTESTINAL: Positive for nausea, constipation, acid reflux and abdominal pain.  GENITOURINARY: No dysuria, hematuria.  ENDOCRINE: No polyuria, nocturia,  HEMATOLOGY: No anemia, easy bruising or bleeding SKIN: No rash or lesion.  MUSCULOSKELETAL: Positive for joint pain NEUROLOGIC: No tingling, numbness, weakness.  PSYCHIATRY: Positive for anxiety and depression.   MEDICATIONS AT HOME:   Prior to Admission medications   Medication Sig Start Date End Date Taking? Authorizing Provider  azelastine (ASTELIN) 0.1 % nasal spray Place 1 spray into the nose 2 (two) times daily. Use in each nostril as directed    Yes Historical Provider, MD  budesonide-formoterol (SYMBICORT) 160-4.5 MCG/ACT inhaler Inhale 2 puffs into the lungs 2 (two) times daily. USE 2 PUFFS 2 TIMES A DAY 11/02/15  Yes Kerman Passey, MD  butalbital-acetaminophen-caffeine (FIORICET,  ESGIC) 50-325-40 MG tablet Take 2 tablets by mouth every 6 (six) hours as needed for headache.   Yes Historical Provider, MD  ciprofloxacin (CIPRO) 250 MG tablet Take 250 mg by mouth 2 (two) times daily.   Yes Historical Provider, MD  clonazePAM (KLONOPIN) 1 MG tablet Take 1 mg by mouth 3 (three) times daily.  02/09/14  Yes Historical Provider, MD  fluticasone (FLONASE) 50 MCG/ACT nasal spray Place 1 spray into both nostrils 2 (two) times daily. 12/11/15  Yes Kerman Passey, MD  gabapentin (NEURONTIN) 400 MG capsule Take 400 mg by mouth 3 (three) times daily.  02/09/14  Yes Historical Provider, MD  hydrOXYzine (VISTARIL) 50 MG capsule Take 50 mg by mouth 2 (two) times daily.   Yes Historical Provider, MD  lamoTRIgine (LAMICTAL) 150 MG tablet Take 300 mg by mouth daily.   Yes Historical Provider, MD  levocetirizine (XYZAL) 5 MG tablet Take 1 tablet (5 mg total) by mouth every evening. 01/31/16  Yes Kerman Passey, MD  montelukast (SINGULAIR) 10 MG tablet TAKE 1 TABLET (10 MG TOTAL) BY MOUTH AT BEDTIME. 01/23/16  Yes Kerman Passey, MD  ondansetron (ZOFRAN) 4 MG tablet Take 5.5 mg by mouth daily.   Yes Historical Provider, MD  oxyCODONE-acetaminophen (PERCOCET/ROXICET) 5-325 MG tablet Take 1 tablet by mouth every 6 (six) hours as needed for severe pain.   Yes Historical Provider, MD  pantoprazole (PROTONIX) 40 MG tablet Take 1 tablet (40 mg total) by mouth daily as needed. Patient taking differently: Take 40 mg by mouth every morning.  03/11/16  Yes Kerman Passey, MD  prazosin (MINIPRESS) 5 MG capsule Take 5 mg by mouth at bedtime.   Yes Historical Provider, MD  promethazine (PHENERGAN) 25 MG tablet TAKE 1 TABLET BY MOUTH EVERY 6 HOURS AS NEEDED Patient taking differently: Take 25 mg by mouth every 6 hours as needed for nausea 03/15/15  Yes Edwena Felty, MD  rOPINIRole (REQUIP) 2 MG tablet Take 2 mg by mouth 2 (two) times daily. 01/02/15  Yes Historical Provider, MD  VENTOLIN HFA 108 (90 BASE) MCG/ACT inhaler  Inhale 1-2 puffs into the lungs every 6 (six) hours as needed for wheezing or shortness of breath. 03/22/15  Yes Edwena Felty, MD  zaleplon (SONATA) 10 MG capsule Take 10 mg by mouth at bedtime.  08/02/15  Yes Historical Provider, MD  Calcium Carb-Cholecalciferol (CALCIUM CARBONATE-VITAMIN D3) 600-400 MG-UNIT TABS Take 1 tablet by mouth 2 (two) times daily.     Historical Provider, MD  EPIPEN 2-PAK 0.3 MG/0.3ML SOAJ injection Inject 0.3 mg into the muscle once.  02/23/15   Historical Provider, MD  nitrofurantoin, macrocrystal-monohydrate, (MACROBID) 100 MG capsule Take 1 capsule (100 mg total) by mouth 2 (two) times daily. Patient not taking: Reported on 04/17/2016 04/03/16   Sudie Grumbling, NP  nortriptyline (PAMELOR) 50 MG capsule Take 100 mg by mouth at bedtime.  Historical Provider, MD  polyethylene glycol powder (GLYCOLAX/MIRALAX) powder Take 17 g by mouth daily as needed for mild constipation. 04/17/16   Kerman PasseyMelinda P Lada, MD      VITAL SIGNS:  Blood pressure 100/63, pulse (!) 127, temperature 99.4 F (37.4 C), temperature source Oral, resp. rate (!) 30, height 5\' 9"  (1.753 m), weight 82.6 kg (182 lb), SpO2 96 %.  PHYSICAL EXAMINATION:  GENERAL:  50 y.o.-year-old patient lying in the bed with no acute distress.  EYES: Pupils equal, round, reactive to light and accommodation. No scleral icterus. Extraocular muscles intact.  HEENT: Head atraumatic, normocephalic. Oropharynx and nasopharynx clear.  NECK:  Supple, no jugular venous distention. No thyroid enlargement, no tenderness.  LUNGS: Normal breath sounds bilaterally, no wheezing, rales,rhonchi or crepitation. No use of accessory muscles of respiration.  CARDIOVASCULAR: S1, S2 Tachycardic. No murmurs, rubs, or gallops.  ABDOMEN: Soft, diffuse abdominal tenderness throughout the abdomen but worse in the lower abdomen. Positive guarding in the lower abdomen, nondistended. Bowel sounds decreased. No organomegaly or mass.  EXTREMITIES: No  pedal edema, cyanosis, or clubbing.  NEUROLOGIC: Cranial nerves II through XII are intact. Muscle strength 5/5 in all extremities. Sensation intact. Gait not checked.  PSYCHIATRIC: The patient is alert and answers questions.  SKIN: No obvious rash, lesion, or ulcer.   LABORATORY PANEL:   CBC  Recent Labs Lab 04/17/16 0518  WBC 12.1*  HGB 13.6  HCT 40.6  PLT 429   ------------------------------------------------------------------------------------------------------------------  Chemistries   Recent Labs Lab 04/17/16 0518  NA 139  K 3.4*  CL 102  CO2 27  GLUCOSE 136*  BUN 10  CREATININE 1.05*  CALCIUM 9.9  AST 28  ALT 18  ALKPHOS 79  BILITOT 0.4   ------------------------------------------------------------------------------------------------------------------  Cardiac Enzymes  Recent Labs Lab 04/17/16 0518  TROPONINI <0.03   ------------------------------------------------------------------------------------------------------------------  RADIOLOGY:  Ct Abdomen Pelvis W Contrast  Result Date: 04/17/2016 CLINICAL DATA:  50 year old female with increasing abdominal and pelvic pain with constipation 2 weeks. EXAM: CT ABDOMEN AND PELVIS WITH CONTRAST TECHNIQUE: Multidetector CT imaging of the abdomen and pelvis was performed using the standard protocol following bolus administration of intravenous contrast. CONTRAST:  100 cc intravenous Isovue-300 COMPARISON:  04/14/2009 CT FINDINGS: Lower chest: Cardiomegaly and bibasilar atelectasis noted. Hepatobiliary: Hepatic steatosis identified without focal hepatic abnormality. Gallbladder is unremarkable. There is no evidence of biliary dilatation. Pancreas: Unremarkable Spleen: Unremarkable Adrenals/Urinary Tract: The kidneys, adrenal glands and bladder are unremarkable except for a punctate nonobstructing left upper pole renal calculus. Stomach/Bowel: There is circumferential wall thickening of the majority of the sigmoid  colon which is likely inflammatory or infectious. There are several foci of pneumoperitoneum, primarily within the pelvis adjacent to the sigmoid colon. There is a moderate to large amount of stool throughout the colon. Small amount of free fluid within the abdomen and pelvis is identified. There is no evidence of small bowel obstruction or definite bowel mass. The appendix is normal. Vascular/Lymphatic: The visualized mesenteric arteries and veins are patent. Abdominal aortic atherosclerotic calcifications noted without aneurysm. No enlarged lymph nodes identified. Reproductive: Unremarkable Other: No definite focal abscess identified. Musculoskeletal: No acute or suspicious abnormality. IMPRESSION: Circumferential wall thickening of the sigmoid colon with several foci of pneumoperitoneum - likely sigmoid colitis with bowel perforation. Small amount of free fluid within the abdomen and pelvis. No evidence of small bowel obstruction. Hepatic steatosis. Cardiomegaly and bibasilar atelectasis. Abdominal aortic atherosclerosis. Punctate nonobstructing left upper pole renal calculus. Critical Value/emergent results were called by telephone at  the time of interpretation on 04/17/2016 at 9:09 am to Dr. Mayford Knife, who verbally acknowledged these results. Electronically Signed   By: Harmon Pier M.D.   On: 04/17/2016 09:11    EKG:   Ordered by me  IMPRESSION AND PLAN:   1. Preop consultation for perforated sigmoid diverticulitis. No contraindications to surgery at this time if surgery needed. 2. COPD. Oxygen as needed. Continue inhalers and order Xopenex treatments. 3. Anxiety depression. Continue psychiatric medications 4. Tachycardia. Obtain an EKG. Likely this is secondary to pain. 5. Perforated sigmoid diverticulitis and lactic acidosis. IV Zosyn. Surgery following. 6. Tobacco abuse smoking cessation counseling done 3 minutes by me. Nicotine patch ordered 7. History of multinodular goiter and History of  hypothyroidism but I don't see any medications on this. Check a TSH. 8. Hypokalemia. Potassium supplementation 1.   Management plans discussed with the patient, family and they are in agreement.  CODE STATUS: Full code  TOTAL TIME TAKING CARE OF THIS PATIENT: 50 minutes.    Alford Highland M.D on 04/17/2016 at 4:44 PM  Between 7am to 6pm - Pager - [phone]  After 6pm go to [email or web address] - password [email or web address]  [employer]  Office  [phone]  CC: Primary care Physician: Baruch Gouty, MD

## 2016-04-17 NOTE — ED Notes (Signed)
Attempted to call report x1, left name and number for nurse to call back

## 2016-04-17 NOTE — ED Notes (Signed)
Dr Darnelle CatalanMalinda notified of Lactic Acid result

## 2016-04-17 NOTE — ED Notes (Signed)
Pt placed on bedpan but states she "can't go." Pt given the option for in & out cath if she is very uncomfortable. She refused and asked to stay on bedpan.

## 2016-04-17 NOTE — Transfer of Care (Signed)
Immediate Anesthesia Transfer of Care Note  Patient: Sabrina Espinoza  Procedure(s) Performed: Procedure(s): EXPLORATORY LAPAROTOMY (N/A) COLOSTOMY CREATION/HARTMANN PROCEDURE (N/A) APPENDECTOMY  Patient Location: PACU  Anesthesia Type:General  Level of Consciousness: sedated  Airway & Oxygen Therapy: Patient Spontanous Breathing and Patient connected to face mask oxygen  Post-op Assessment: Report given to RN and Post -op Vital signs reviewed and stable  Post vital signs: Reviewed and stable  Last Vitals:  Vitals:   04/17/16 1519 04/17/16 1823  BP: 100/63 114/67  Pulse: (!) 127 (!) 120  Resp:    Temp:  (!) 39.4 C    Last Pain:  Vitals:   04/17/16 1959  TempSrc:   PainSc: 10-Worst pain ever         Complications: No apparent anesthesia complications

## 2016-04-17 NOTE — ED Triage Notes (Signed)
EMS pt from home with c/o abd pain;  Pt says she has not had a bowel movement in 3 weeks; says usually she can take OTC stool softeners and eventually get relief; has not worked this time; pt restless, moaning; adds currently being treated with Cipro for UTI; this is her second round of antibiotics for same; awake and alert; oriented x 3; talking in complete coherent sentences

## 2016-04-17 NOTE — ED Provider Notes (Signed)
[facility] Emergency Department Provider Note   ____________________________________________   First MD Initiated Contact with Patient 04/17/16 0510     (approximate)  I have reviewed the triage vital signs and the nursing notes.   HISTORY  Chief Complaint Abdominal Pain and Constipation    HPI Sabrina Espinoza is a 50 y.o. female patient complains of 2 weeks of increasing abdominal pain and not having any stool. Pain has gotten very bad in the last couple days.  Patient has no nausea or vomiting. Pain is now severe steady "pain." Throughout the hall abdomen. Pain is made worse by deep breathing and outpatient   Past Medical History:  Diagnosis Date  . Chronic constipation    on Linzess, Miralax, Zofran  . Chronic insomnia 11/02/2015  . COPD, moderate (HCC)   . Depression with anxiety    Followed by Dr. Lynett Fish, psychiatrist at [facility] and Wilma Flavin for thrapy  . History of chicken pox   . History of cocaine use   . History of pneumonia   . Hypothyroidism, adult    Working with Dr. Horton Chin, Endocrinology. S/P RAIU and is planning on biopsy  . Migraine without status migrainosus, not intractable   . Nicotine dependence, uncomplicated   . Nontoxic multinodular goiter    FNA done 10/30/14 with benign results. Seen [name]. Patient may want a second opinion.  . Rhinitis, allergic   . Vitamin D deficiency     Patient Active Problem List   Diagnosis Date Noted  . Pallor 03/11/2016  . Tongue papillae atrophy 03/11/2016  . Encounter for medication monitoring 03/11/2016  . Peripheral neuropathy (HCC) 03/11/2016  . Thrush 03/11/2016  . Right cervical radiculopathy 01/15/2016  . Chronic insomnia 11/02/2015  . Right knee pain 08/22/2015  . Midline low back pain without sciatica 05/07/2015  . Elevated blood pressure 04/20/2015  . Polypharmacy 03/22/2015  . Foot pain, right 03/16/2015  . Pain pharynx 03/08/2015  .  GERD without esophagitis 03/08/2015  . Abnormal mammogram of right breast 03/07/2015  . Degenerative disc disease, cervical 02/26/2015  . Bilateral leg cramps 02/26/2015  . Skin lesions, generalized 02/26/2015  . Allergic rhinitis with postnasal drip 01/31/2015  . Chronic sinusitis with recurrent bronchitis 01/31/2015  . Oral thrush 01/31/2015  . History of cocaine use 01/16/2015  . 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 Chronic hepatitis C without hepatic coma (HCC) 01/16/2015  . Chronic constipation 01/16/2015  . COPD, moderate (HCC) 01/16/2015  . Cigarette smoker 01/16/2015  . Bipolar 1 disorder, mixed, partial remission (HCC) 01/16/2015  . Migraine without aura and without status migrainosus, not intractable 01/16/2015  . Depression with anxiety 01/16/2015  . Cephalalgia 03/06/2014  . Imbalance 03/06/2014  . Insomnia due to medical condition 03/06/2014    Past Surgical History:  Procedure Laterality Date  . ANKLE FRACTURE SURGERY  1988   rods and pins in place  . CESAREAN SECTION  1996  . ECTOPIC PREGNANCY SURGERY  2003    Prior to Admission medications   Medication Sig Start Date End Date Taking? Authorizing Provider  azelastine (ASTELIN) 0.1 % nasal spray Place 1 spray into the nose 2 (two) times daily. Use in each nostril as directed     Historical Provider, MD  budesonide-formoterol (SYMBICORT) 160-4.5 MCG/ACT inhaler Inhale 2 puffs into the lungs 2 (two) times daily. USE 2 PUFFS 2 TIMES A DAY 11/02/15   Kerman Passey, MD  Calcium Carb-Cholecalciferol (CALCIUM CARBONATE-VITAMIN D3) 600-400 MG-UNIT TABS  Take 1 tablet by mouth 2 (two) times daily.     Historical Provider, MD  clonazePAM (KLONOPIN) 1 MG tablet Take 1 mg by mouth 3 (three) times daily.  02/09/14   Historical Provider, MD  EPIPEN 2-PAK 0.3 MG/0.3ML SOAJ injection Inject 0.3 mg into the muscle once.  02/23/15   Historical Provider, MD  fluticasone (FLONASE) 50 MCG/ACT nasal spray Place 1 spray into both  nostrils 2 (two) times daily. 12/11/15   Kerman Passey, MD  gabapentin (NEURONTIN) 400 MG capsule Take 400 mg by mouth 3 (three) times daily.  02/09/14   Historical Provider, MD  hydrOXYzine (VISTARIL) 50 MG capsule Take 50 mg by mouth 2 (two) times daily.    Historical Provider, MD  levocetirizine (XYZAL) 5 MG tablet Take 1 tablet (5 mg total) by mouth every evening. 01/31/16   Kerman Passey, MD  montelukast (SINGULAIR) 10 MG tablet TAKE 1 TABLET (10 MG TOTAL) BY MOUTH AT BEDTIME. 01/23/16   Kerman Passey, MD  nitrofurantoin, macrocrystal-monohydrate, (MACROBID) 100 MG capsule Take 1 capsule (100 mg total) by mouth 2 (two) times daily. 04/03/16   Sudie Grumbling, NP  nortriptyline (PAMELOR) 50 MG capsule Take 100 mg by mouth at bedtime.     Historical Provider, MD  oxyCODONE-acetaminophen (PERCOCET) 7.5-325 MG tablet Take 1 tablet by mouth every 4 (four) hours as needed for severe pain.    Historical Provider, MD  pantoprazole (PROTONIX) 40 MG tablet Take 1 tablet (40 mg total) by mouth daily as needed. Patient taking differently: Take 40 mg by mouth every morning.  03/11/16   Kerman Passey, MD  polyethylene glycol powder (GLYCOLAX/MIRALAX) powder Take 17 g by mouth daily as needed for mild constipation.  03/11/15   Historical Provider, MD  prazosin (MINIPRESS) 5 MG capsule Take 5 mg by mouth at bedtime.    Historical Provider, MD  promethazine (PHENERGAN) 25 MG tablet TAKE 1 TABLET BY MOUTH EVERY 6 HOURS AS NEEDED Patient taking differently: Take 25 mg by mouth every 6 hours as needed for nausea 03/15/15   Edwena Felty, MD  rOPINIRole (REQUIP) 2 MG tablet Take 2 mg by mouth 2 (two) times daily. 01/02/15   Historical Provider, MD  VENTOLIN HFA 108 (90 BASE) MCG/ACT inhaler Inhale 1-2 puffs into the lungs every 6 (six) hours as needed for wheezing or shortness of breath. 03/22/15   Edwena Felty, MD  zaleplon (SONATA) 10 MG capsule Take 10 mg by mouth at bedtime.  08/02/15   Historical Provider, MD     Allergies Review of patient's allergies indicates no known allergies.  Family History  Problem Relation Age of Onset  . Heart disease Father   . Alcohol abuse Father   . Diabetes Father   . Heart disease Brother   . Depression Brother   . Stroke Brother   . Alcohol abuse Paternal Grandfather   . Breast cancer Neg Hx     Social History Social History  Substance Use Topics  . Smoking status: Current Every Day Smoker    Packs/day: 1.00    Years: 32.00    Types: Cigarettes  . Smokeless tobacco: Never Used  . Alcohol use No    Review of Systems Constitutional: No fever/chills Eyes: No visual changes. ENT: No sore throat. Cardiovascular: Denies chest pain. Respiratory: Denies shortness of breath. Gastrointestinal: See history of present illness Genitourinary: Negative for dysuria. Musculoskeletal: Negative for back pain. Skin: Negative for rash.  10-point ROS otherwise negative.  ____________________________________________  PHYSICAL EXAM:  VITAL SIGNS: ED Triage Vitals  Enc Vitals Group     BP 04/17/16 0457 (!) 114/98     Pulse Rate 04/17/16 0457 (!) 103     Resp 04/17/16 0457 (!) 28     Temp 04/17/16 0457 98.3 F (36.8 C)     Temp Source 04/17/16 0457 Oral     SpO2 04/17/16 0457 100 %     Weight 04/17/16 0458 182 lb (82.6 kg)     Height 04/17/16 0458 5\' 9"  (1.753 m)     Head Circumference --      Peak Flow --      Pain Score 04/17/16 0458 10     Pain Loc --      Pain Edu? --      Excl. in GC? --     Constitutional: Alert and oriented. Ill appearing and in acute distress. Eyes: Conjunctivae are normal. PERRL. EOMI. Head: Atraumatic. Nose: No congestion/rhinnorhea. Mouth/Throat: Mucous membranes are moist.  Oropharynx non-erythematous. Neck: No stridor. Cardiovascular: Normal rate, regular rhythm. Grossly normal heart sounds.  Good peripheral circulation. Respiratory: Patient taking small short breaths. Lungs are clear Gastrointestinal: Soft and  diffusely tender to mild palpation.distention. No abdominal bruits bowel sounds are decreased. No CVA tenderness. Musculoskeletal: No lower extremity tenderness nor edema.  No joint effusions.   ____________________________________________   LABS (all labs ordered are listed, but only abnormal results are displayed)  Labs Reviewed  COMPREHENSIVE METABOLIC PANEL - Abnormal; Notable for the following:       Result Value   Potassium 3.4 (*)    Glucose, Bld 136 (*)    Creatinine, Ser 1.05 (*)    All other components within normal limits  CBC WITH DIFFERENTIAL/PLATELET - Abnormal; Notable for the following:    WBC 12.1 (*)    RDW 15.0 (*)    Neutro Abs 9.9 (*)    All other components within normal limits  LACTIC ACID, PLASMA - Abnormal; Notable for the following:    Lactic Acid, Venous 2.4 (*)    All other components within normal limits  BLOOD GAS, VENOUS - Abnormal; Notable for the following:    pH, Ven 7.52 (*)    pCO2, Ven 36 (*)    Bicarbonate 29.4 (*)    Acid-Base Excess 6.4 (*)    All other components within normal limits  LIPASE, BLOOD  TROPONIN I  HCG, QUANTITATIVE, PREGNANCY  URINALYSIS COMPLETEWITH MICROSCOPIC ([facility] ONLY)  LACTIC ACID, PLASMA   ____________________________________________  EKG  EKG read and is turbid by me shows sinus tachycardia rate of 121 normal axis nonspecific ST-T wave changes ____________________________________________  RADIOLOGY   ____________________________________________   PROCEDURES  Procedure(s) performed  Procedures  Critical Care performed:   ____________________________________________   INITIAL IMPRESSION / ASSESSMENT AND PLAN / ED COURSE  Pertinent labs & imaging results that were available during my care of the patient were reviewed by me and considered in my medical decision making (see chart for details).    Clinical Course   Signed out to Dr.  Mayford KnifeWilliams  ____________________________________________   FINAL CLINICAL IMPRESSION(S) / ED DIAGNOSES  Final diagnoses:  Generalized abdominal pain      NEW MEDICATIONS STARTED DURING THIS VISIT:  New Prescriptions   No medications on file     Note:  This document was prepared using Dragon voice recognition software and may include unintentional dictation errors.    Arnaldo NatalPaul F Malinda, MD 04/17/16 [phone]

## 2016-04-17 NOTE — Progress Notes (Signed)
Per Dr. Tonita CongWoodham okay to give patient a 1 x dose of 4mg  morphine

## 2016-04-17 NOTE — Anesthesia Preprocedure Evaluation (Signed)
Anesthesia Evaluation  Patient identified by MRN, date of birth, ID band Patient awake    Reviewed: Allergy & Precautions, NPO status , Patient's Chart, lab work & pertinent test results  Airway Mallampati: II       Dental  (+) Chipped   Pulmonary COPD,  COPD inhaler, Current Smoker,    Pulmonary exam normal        Cardiovascular negative cardio ROS   Rate:Tachycardia     Neuro/Psych  Headaches, PSYCHIATRIC DISORDERS Anxiety Depression Bipolar Disorder Peripheral neuropathy  Neuromuscular disease    GI/Hepatic GERD  Medicated and Controlled,(+)     substance abuse  alcohol use and cocaine use, Hepatitis -, CDiverticulitis Hx   Endo/Other  Hypothyroidism   Renal/GU      Musculoskeletal  (+) Arthritis , Osteoarthritis,    Abdominal (+) + obese,   Peds negative pediatric ROS (+)  Hematology negative hematology ROS (+)   Anesthesia Other Findings   Reproductive/Obstetrics                             Anesthesia Physical Anesthesia Plan  ASA: III and emergent  Anesthesia Plan: General   Post-op Pain Management:    Induction: Intravenous, Rapid sequence and Cricoid pressure planned  Airway Management Planned: Oral ETT  Additional Equipment:   Intra-op Plan:   Post-operative Plan: Extubation in OR  Informed Consent: I have reviewed the patients History and Physical, chart, labs and discussed the procedure including the risks, benefits and alternatives for the proposed anesthesia with the patient or authorized representative who has indicated his/her understanding and acceptance.   Dental advisory given  Plan Discussed with: CRNA and Surgeon  Anesthesia Plan Comments:         Anesthesia Quick Evaluation

## 2016-04-17 NOTE — ED Notes (Signed)
Pt had been placed on bed pan by tech, Mayra; pt has been unable to void; removed from bedpan at this time

## 2016-04-17 NOTE — ED Notes (Signed)
In & out cath performed on pt for sample and bladder drained. She produced 480 ml of urine.

## 2016-04-18 ENCOUNTER — Encounter: Payer: Self-pay | Admitting: Surgery

## 2016-04-18 LAB — CBC
HCT: 33.6 % — ABNORMAL LOW (ref 35.0–47.0)
Hemoglobin: 11.2 g/dL — ABNORMAL LOW (ref 12.0–16.0)
MCH: 28.7 pg (ref 26.0–34.0)
MCHC: 33.3 g/dL (ref 32.0–36.0)
MCV: 86.2 fL (ref 80.0–100.0)
PLATELETS: 306 10*3/uL (ref 150–440)
RBC: 3.9 MIL/uL (ref 3.80–5.20)
RDW: 15.7 % — ABNORMAL HIGH (ref 11.5–14.5)
WBC: 16.6 10*3/uL — ABNORMAL HIGH (ref 3.6–11.0)

## 2016-04-18 LAB — BASIC METABOLIC PANEL
Anion gap: 6 (ref 5–15)
BUN: 12 mg/dL (ref 6–20)
CALCIUM: 7.5 mg/dL — AB (ref 8.9–10.3)
CO2: 24 mmol/L (ref 22–32)
CREATININE: 0.98 mg/dL (ref 0.44–1.00)
Chloride: 107 mmol/L (ref 101–111)
GFR calc Af Amer: >60 mL/min (ref >60)
GLUCOSE: 96 mg/dL (ref 65–99)
POTASSIUM: 3.9 mmol/L (ref 3.5–5.1)
Sodium: 137 mmol/L (ref 135–145)

## 2016-04-18 LAB — PHOSPHORUS: Phosphorus: 4.1 mg/dL (ref 2.5–4.6)

## 2016-04-18 LAB — MAGNESIUM: Magnesium: 1.1 mg/dL — ABNORMAL LOW (ref 1.7–2.4)

## 2016-04-18 LAB — GLUCOSE, CAPILLARY: Glucose-Capillary: 106 mg/dL — ABNORMAL HIGH (ref 65–99)

## 2016-04-18 LAB — MRSA PCR SCREENING: MRSA BY PCR: NEGATIVE

## 2016-04-18 LAB — TSH: TSH: 0.363 u[IU]/mL (ref 0.350–4.500)

## 2016-04-18 MED ORDER — DIPHENHYDRAMINE HCL 12.5 MG/5ML PO ELIX: 12.5000 mg | ORAL_SOLUTION | Freq: Four times a day (QID) | ORAL | Status: DC | PRN

## 2016-04-18 MED ORDER — MORPHINE SULFATE 2 MG/ML IV SOLN
INTRAVENOUS | Status: DC
  Administered 2016-04-18: 3 mL via INTRAVENOUS
  Administered 2016-04-18: 14:00:00 via INTRAVENOUS
  Administered 2016-04-18: 5 mL via INTRAVENOUS
  Administered 2016-04-18: 02:00:00 via INTRAVENOUS
  Administered 2016-04-19: 2 mg via INTRAVENOUS
  Administered 2016-04-19: 11:00:00 via INTRAVENOUS
  Administered 2016-04-20: 2 mg via INTRAVENOUS
  Administered 2016-04-20: 1 mg via INTRAVENOUS
  Administered 2016-04-20: 4 mg via INTRAVENOUS
  Administered 2016-04-20: 13:00:00 via INTRAVENOUS
  Administered 2016-04-21: 8 mg via INTRAVENOUS
  Administered 2016-04-21: 7 mg via INTRAVENOUS
  Administered 2016-04-21: 5.5 mL via INTRAVENOUS
  Administered 2016-04-22: 20 mg via INTRAVENOUS
  Administered 2016-04-22: 5 mg via INTRAVENOUS
  Administered 2016-04-22: 0 mL via INTRAVENOUS
  Administered 2016-04-22: 1.5 mL via INTRAVENOUS
  Administered 2016-04-22: 1 mg via INTRAVENOUS
  Administered 2016-04-22: 12:00:00 via INTRAVENOUS
  Administered 2016-04-23: 1 mg via INTRAVENOUS
  Administered 2016-04-23: 5 mg via INTRAVENOUS
  Administered 2016-04-23: 0 mg via INTRAVENOUS
  Filled 2016-04-18 (×5): qty 25

## 2016-04-18 MED ORDER — KETOROLAC TROMETHAMINE 30 MG/ML IJ SOLN
30.0000 mg | Freq: Four times a day (QID) | INTRAMUSCULAR | Status: AC
  Administered 2016-04-18 – 2016-04-22 (×20): 30 mg via INTRAVENOUS
  Filled 2016-04-18 (×22): qty 1

## 2016-04-18 MED ORDER — LORAZEPAM 2 MG/ML IJ SOLN
1.0000 mg | INTRAMUSCULAR | Status: DC | PRN
  Administered 2016-04-18 – 2016-04-21 (×6): 1 mg via INTRAVENOUS
  Filled 2016-04-18 (×6): qty 1

## 2016-04-18 MED ORDER — HALOPERIDOL LACTATE 5 MG/ML IJ SOLN: 5.0000 mg | Freq: Four times a day (QID) | INTRAMUSCULAR | Status: DC | PRN

## 2016-04-18 MED ORDER — ONDANSETRON HCL 4 MG/2ML IJ SOLN: 4.0000 mg | Freq: Four times a day (QID) | INTRAMUSCULAR | Status: DC | PRN

## 2016-04-18 MED ORDER — SODIUM CHLORIDE 0.9% FLUSH: 9.0000 mL | INTRAVENOUS | Status: DC | PRN

## 2016-04-18 MED ORDER — LACTATED RINGERS IV SOLN
INTRAVENOUS | Status: DC
  Administered 2016-04-18: 16:00:00 via INTRAVENOUS
  Administered 2016-04-18: 150 mL/h via INTRAVENOUS
  Administered 2016-04-18 – 2016-04-22 (×8): via INTRAVENOUS

## 2016-04-18 MED ORDER — ONDANSETRON HCL 4 MG/2ML IJ SOLN: 4.0000 mg | Freq: Once | INTRAMUSCULAR | Status: DC | PRN

## 2016-04-18 MED ORDER — NALOXONE HCL 0.4 MG/ML IJ SOLN: 0.4000 mg | INTRAMUSCULAR | Status: DC | PRN

## 2016-04-18 MED ORDER — MAGNESIUM SULFATE 4 GM/100ML IV SOLN
4.0000 g | Freq: Once | INTRAVENOUS | Status: AC
  Administered 2016-04-18: 4 g via INTRAVENOUS
  Filled 2016-04-18: qty 100

## 2016-04-18 MED ORDER — FENTANYL CITRATE (PF) 100 MCG/2ML IJ SOLN: 25.0000 ug | INTRAMUSCULAR | Status: DC | PRN

## 2016-04-18 MED ORDER — ORAL CARE MOUTH RINSE
15.0000 mL | Freq: Two times a day (BID) | OROMUCOSAL | Status: DC
  Administered 2016-04-18 (×2): 15 mL via OROMUCOSAL

## 2016-04-18 MED ORDER — DIPHENHYDRAMINE HCL 50 MG/ML IJ SOLN: 12.5000 mg | Freq: Four times a day (QID) | INTRAMUSCULAR | Status: DC | PRN

## 2016-04-18 MED ORDER — VENTOLIN HFA 108 (90 BASE) MCG/ACT IN AERS: 2.0000 | INHALATION_SPRAY | RESPIRATORY_TRACT | 1 refills | Status: AC | PRN

## 2016-04-18 NOTE — Progress Notes (Signed)
[facility] [place] [facility]   PATIENT NAME: Sabrina Espinoza    MR#:  [id number]  DATE OF BIRTH:  1965-11-17  SUBJECTIVE:  CHIEF COMPLAINT:   Chief Complaint  Patient presents with  . Abdominal Pain  . Constipation   Abdominal pain is better. On O2 [place] 2L.  REVIEW OF SYSTEMS:  Review of Systems  Constitutional: Positive for malaise/fatigue. Negative for chills and fever.  HENT: Negative for sore throat.   Eyes: Negative for blurred vision and double vision.  Respiratory: Negative for cough, hemoptysis, shortness of breath, wheezing and stridor.   Cardiovascular: Negative for chest pain, palpitations and leg swelling.  Gastrointestinal: Positive for abdominal pain. Negative for nausea and vomiting.  Musculoskeletal: Negative for back pain.  Skin: Negative for itching and rash.  Neurological: Negative for dizziness, focal weakness, loss of consciousness and headaches.  Psychiatric/Behavioral: Negative for depression. The patient is not nervous/anxious.     DRUG ALLERGIES:  No Known Allergies VITALS:  Blood pressure 104/82, pulse (!) 101, temperature 97.7 F (36.5 C), temperature source Oral, resp. rate (!) 21, height 5\' 9"  (1.753 m), weight 203 lb 0.7 oz (92.1 kg), SpO2 97 %. PHYSICAL EXAMINATION:  Physical Exam  Constitutional: She is oriented to person, place, and time and well-developed, well-nourished, and in no distress.  Obese.  HENT:  Head: Normocephalic.  Mouth/Throat: Oropharynx is clear and moist.  Eyes: Conjunctivae and EOM are normal. No scleral icterus.  Neck: Normal range of motion. Neck supple. No JVD present. No tracheal deviation present.  Cardiovascular: Normal rate, regular rhythm and normal heart sounds.  Exam reveals no gallop.   No murmur heard. Pulmonary/Chest: Effort normal and breath sounds normal. No respiratory distress. She has no wheezes. She has no rales.  Abdominal: Soft. She exhibits no distension. There is  tenderness.  Abdomen in dressing. LLQ Colostomy  Musculoskeletal: Normal range of motion. She exhibits no edema or tenderness.  Neurological: She is alert and oriented to person, place, and time. No cranial nerve deficit.  Skin: Skin is dry.  Psychiatric: Affect and judgment normal.   LABORATORY PANEL:   CBC  Recent Labs Lab 04/18/16 0507  WBC 16.6*  HGB 11.2*  HCT 33.6*  PLT 306   ------------------------------------------------------------------------------------------------------------------ Chemistries   Recent Labs Lab 04/17/16 0518  04/18/16 0507  NA 139  < > 137  K 3.4*  < > 3.9  CL 102  < > 107  CO2 27  < > 24  GLUCOSE 136*  < > 96  BUN 10  < > 12  CREATININE 1.05*  < > 0.98  CALCIUM 9.9  < > 7.5*  MG  --   --  1.1*  AST 28  --   --   ALT 18  --   --   ALKPHOS 79  --   --   BILITOT 0.4  --   --   < > = values in this interval not displayed. RADIOLOGY:  No results found. ASSESSMENT AND PLAN:   1. Perforated sigmoid diverticulitis.  S/p Open sigmoid colectomy with end colostomy and pelvic abscess drainage. POD1. NPO, IVF, Pain control, wound and colostomy care. F/u CBC.  2. COPD.  Stable. On O2 [place] 2 L,  Continue inhalers and order Xopenex treatments. 3. Anxiety depression. Continue psychiatric medications 4. Tachycardia. Improved. 5. Perforated sigmoid diverticulitis and lactic acidosis. IV Zosyn. Surgery following. 6. Tobacco abuse smoking cessation counseling done 3 minutes by me. Nicotine patch ordered 7. History  of multinodular goiter and History of hypothyroidism. nomral TSH. 8. Hypokalemia. Potassium supplementation 1. Improved. Hypomagnesemia. IV mag. F/u mag.  All the records are reviewed and case discussed with RN, Care Management/Social Worker. Management plans discussed with the patient, her mother and they are in agreement.  CODE STATUS: full code.  TOTAL TIME TAKING CARE OF THIS PATIENT: 35 minutes.   More than 50% of the time was  spent in counseling/coordination of care: YES  POSSIBLE D/C IN >3 DAYS, DEPENDING ON CLINICAL CONDITION.   Shaune Pollack M.D on 04/18/2016 at 1:03 PM  Between 7am to 6pm - Pager - [phone]  After 6pm go to [email or web address] - Social research officer, government  [facility] [place]  Office  [phone]  CC: Primary care physician; Baruch Gouty, MD  Note: This dictation was prepared with Dragon dictation along with smaller phrase technology. Any transcriptional errors that result from this process are unintentional.

## 2016-04-18 NOTE — Progress Notes (Signed)
Per Dr Tonita CongWoodham do not change patients dressing today. It has not been 24 hours and he will address the dressing change tomorrow.

## 2016-04-18 NOTE — Progress Notes (Signed)
1 Day Post-Op   Subjective:  Patient reports feeling better today than she did before her surgery. Continues to have appropriate abdominal pain. He has a strong desire for oral intake.  Vital signs in last 24 hours: Temp:  [97.7 F (36.5 C)-103 F (39.4 C)] 97.7 F (36.5 C) (10/13 0400) Pulse Rate:  [97-127] 100 (10/13 1400) Resp:  [12-34] 21 (10/13 1411) BP: (97-128)/(59-109) 118/76 (10/13 1400) SpO2:  [92 %-99 %] 99 % (10/13 1411) Weight:  [92.1 kg (203 lb 0.7 oz)] 92.1 kg (203 lb 0.7 oz) (10/13 0026) Last BM Date:  (UT-Pt unsure)  Intake/Output from previous day: 10/12 0701 - 10/13 0700 In: 6123 [I.V.:5723; IV Piggyback:400] Out: 2080 [Urine:1560; Emesis/NG output:30; Drains:190; Blood:100]  GI: Abdomen is soft, purple tender to palpation along her midline, midline dressing is clean, dry, intact. Ostomy present left lower quadrant that is pink and patent but not currently productive of any stool yet. JP drains in the pelvis are draining a seropurulent fluid.  Lab Results:  CBC  Recent Labs  04/17/16 2007 04/18/16 0507  WBC 17.1* 16.6*  HGB 12.7 11.2*  HCT 37.4 33.6*  PLT 330 306   CMP     Component Value Date/Time   NA 137 04/18/2016 0507   NA 138 03/11/2016 1527   NA 137 09/27/2014 1747   K 3.9 04/18/2016 0507   K 3.7 09/27/2014 1747   CL 107 04/18/2016 0507   CL 99 (L) 09/27/2014 1747   CO2 24 04/18/2016 0507   CO2 29 09/27/2014 1747   GLUCOSE 96 04/18/2016 0507   GLUCOSE 96 09/27/2014 1747   BUN 12 04/18/2016 0507   BUN 7 03/11/2016 1527   BUN 8 09/27/2014 1747   CREATININE 0.98 04/18/2016 0507   CREATININE 0.82 09/27/2014 1747   CALCIUM 7.5 (L) 04/18/2016 0507   CALCIUM 9.2 09/27/2014 1747   PROT 6.9 04/17/2016 0518   PROT 6.9 03/11/2016 1527   ALBUMIN 3.7 04/17/2016 0518   ALBUMIN 4.1 03/11/2016 1527   AST 28 04/17/2016 0518   ALT 18 04/17/2016 0518   ALKPHOS 79 04/17/2016 0518   BILITOT 0.4 04/17/2016 0518   BILITOT <0.2 03/11/2016 1527    GFRNONAA >60 04/18/2016 0507   GFRNONAA >60 09/27/2014 1747   GFRAA >60 04/18/2016 0507   GFRAA >60 09/27/2014 1747   PT/INR No results for input(s): LABPROT, INR in the last 72 hours.  Studies/Results: Ct Abdomen Pelvis W Contrast  Result Date: 04/17/2016 CLINICAL DATA:  50 year old female with increasing abdominal and pelvic pain with constipation 2 weeks. EXAM: CT ABDOMEN AND PELVIS WITH CONTRAST TECHNIQUE: Multidetector CT imaging of the abdomen and pelvis was performed using the standard protocol following bolus administration of intravenous contrast. CONTRAST:  100 cc intravenous Isovue-300 COMPARISON:  04/14/2009 CT FINDINGS: Lower chest: Cardiomegaly and bibasilar atelectasis noted. Hepatobiliary: Hepatic steatosis identified without focal hepatic abnormality. Gallbladder is unremarkable. There is no evidence of biliary dilatation. Pancreas: Unremarkable Spleen: Unremarkable Adrenals/Urinary Tract: The kidneys, adrenal glands and bladder are unremarkable except for a punctate nonobstructing left upper pole renal calculus. Stomach/Bowel: There is circumferential wall thickening of the majority of the sigmoid colon which is likely inflammatory or infectious. There are several foci of pneumoperitoneum, primarily within the pelvis adjacent to the sigmoid colon. There is a moderate to large amount of stool throughout the colon. Small amount of free fluid within the abdomen and pelvis is identified. There is no evidence of small bowel obstruction or definite bowel mass. The appendix is  normal. Vascular/Lymphatic: The visualized mesenteric arteries and veins are patent. Abdominal aortic atherosclerotic calcifications noted without aneurysm. No enlarged lymph nodes identified. Reproductive: Unremarkable Other: No definite focal abscess identified. Musculoskeletal: No acute or suspicious abnormality. IMPRESSION: Circumferential wall thickening of the sigmoid colon with several foci of pneumoperitoneum -  likely sigmoid colitis with bowel perforation. Small amount of free fluid within the abdomen and pelvis. No evidence of small bowel obstruction. Hepatic steatosis. Cardiomegaly and bibasilar atelectasis. Abdominal aortic atherosclerosis. Punctate nonobstructing left upper pole renal calculus. Critical Value/emergent results were called by telephone at the time of interpretation on 04/17/2016 at 9:09 am to Dr. Mayford KnifeWilliams, who verbally acknowledged these results. Electronically Signed   By: Harmon PierJeffrey  Hu M.D.   On: 04/17/2016 09:11    Assessment/Plan: 50 year old female status post export her laparotomy with end colostomy for ruptured diverticulitis. Doing well. Continue nothing by mouth until there is evidence of bowel function. High probability of developing an ileus that would require a repeat NG tube should they develop. Encourage incentive spirometer usage. Keep in a stepdown status for today with possible transition to the floor tomorrow. We will perform first dressing change in the morning.   Ricarda Frameharles Drayce Tawil, MD FACS General Surgeon  04/18/2016

## 2016-04-18 NOTE — Consult Note (Signed)
WOC Nurse ostomy consult note Stoma type/location: LLQ Colostomy Stomal assessment/size: 1 3/4" well budded, pink and moist.  Stomal sweat only in pouch at this time.  Peristomal assessment: Not assessed.  Pouch intact.  Patient drowsy and minimally responsive to questions.  Treatment options for stomal/peristomal skin: N/A Output Stoma sweat in pouch Ostomy pouching: 1pc. In place at this time.  LIkely to change to 2 3/4" 2 piece at next pouch change.   Education provided: Chief Operating OfficerWritten materials provided.  Discussed that pouch change will be Monday when alert and able to learn.  States she will review info over the weekend.   Enrolled patient in [employer] DC program: N/A WOC team will follow and remain available to patient, medical and nursing teams.  Maple HudsonKaren Graycie Halley RN BSN CWON Pager [phone]

## 2016-04-18 NOTE — Progress Notes (Signed)
In room as second nurse to verify the new PCA morphine syringe.

## 2016-04-18 NOTE — Anesthesia Postprocedure Evaluation (Signed)
Anesthesia Post Note  Patient: Sabrina Espinoza  Procedure(s) Performed: Procedure(s) (LRB): EXPLORATORY LAPAROTOMY (N/A) COLOSTOMY CREATION/HARTMANN PROCEDURE (N/A) APPENDECTOMY  Patient location during evaluation: ICU Anesthesia Type: General Level of consciousness: awake Pain management: pain level controlled Vital Signs Assessment: post-procedure vital signs reviewed and stable Respiratory status: spontaneous breathing Cardiovascular status: stable Anesthetic complications: no Comments: Patient sleeping- easily aroused. NAD noted    Last Vitals:  Vitals:   04/18/16 0500 04/18/16 0600  BP: (!) 123/96 116/87  Pulse: (!) 106 (!) 104  Resp: (!) 21 18  Temp:      Last Pain:  Vitals:   04/18/16 0400  TempSrc: Oral  PainSc: Asleep                 Starling Mannsurtis,  Keniah Klemmer A

## 2016-04-18 NOTE — Progress Notes (Signed)
MEDICATION RELATED CONSULT NOTE - INITIAL   Pharmacy Consult for electrolyte replacement and monitoring Indication: hypomagnesemia  No Known Allergies  Patient Measurements: Height: 5\' 9"  (175.3 cm) Weight: 203 lb 0.7 oz (92.1 kg) IBW/kg (Calculated) : 66.2 Adjusted Body Weight:   Vital Signs: Temp: 97.7 F (36.5 C) (10/13 0400) Temp Source: Oral (10/13 0400) BP: 116/87 (10/13 0600) Pulse Rate: 104 (10/13 0600) Intake/Output from previous day: 10/12 0701 - 10/13 0700 In: 6123 [I.V.:5723; IV Piggyback:400] Out: 2080 [Urine:1560; Emesis/NG output:30; Drains:190; Blood:100] Intake/Output from this shift: Total I/O In: 5073 [I.V.:4723; IV Piggyback:350] Out: 1830 [Urine:1310; Emesis/NG output:30; Drains:190; Other:200; Blood:100]  Labs:  Recent Labs  04/17/16 0518 04/17/16 2007 04/18/16 0507  WBC 12.1* 17.1* 16.6*  HGB 13.6 12.7 11.2*  HCT 40.6 37.4 33.6*  PLT 429 330 306  CREATININE 1.05* 1.14* 0.98  MG  --   --  1.1*  PHOS  --   --  4.1  ALBUMIN 3.7  --   --   PROT 6.9  --   --   AST 28  --   --   ALT 18  --   --   ALKPHOS 79  --   --   BILITOT 0.4  --   --    Estimated Creatinine Clearance: 85.8 mL/min (by C-G formula based on SCr of 0.98 mg/dL).   Microbiology: Recent Results (from the past 720 hour(s))  Urine culture     Status: Abnormal   Collection Time: 04/03/16  2:51 PM  Result Value Ref Range Status   Specimen Description URINE, CLEAN CATCH  Final   Special Requests NONE  Final   Culture >=100,000 COLONIES/mL KLEBSIELLA PNEUMONIAE (A)  Final   Report Status 04/05/2016 FINAL  Final   Organism ID, Bacteria KLEBSIELLA PNEUMONIAE (A)  Final      Susceptibility   Klebsiella pneumoniae - MIC*    AMPICILLIN 16 RESISTANT Resistant     CEFAZOLIN <=4 SENSITIVE Sensitive     CEFTRIAXONE <=1 SENSITIVE Sensitive     CIPROFLOXACIN <=0.25 SENSITIVE Sensitive     GENTAMICIN <=1 SENSITIVE Sensitive     IMIPENEM <=0.25 SENSITIVE Sensitive     NITROFURANTOIN  <=16 SENSITIVE Sensitive     TRIMETH/SULFA <=20 SENSITIVE Sensitive     AMPICILLIN/SULBACTAM 4 SENSITIVE Sensitive     PIP/TAZO <=4 SENSITIVE Sensitive     Extended ESBL NEGATIVE Sensitive     * >=100,000 COLONIES/mL KLEBSIELLA PNEUMONIAE    Medical History: Past Medical History:  Diagnosis Date  . Anxiety   . Chronic constipation    on Linzess, Miralax, Zofran  . Chronic insomnia 11/02/2015  . COPD, moderate (HCC)   . Depression   . Depression with anxiety    Follow[facility] [name][facility] and [name] for thrapy  . History of chicken pox   . History of cocaine use   . History of pneumonia   . Hypothyroidism, adult    Working with Dr. Horton ChinShamil [name], Endocrinology. S/P RAIU and is planning on biopsy  . Migraine without status migrainosus, not intractable   . Nicotine dependence, uncomplicated   . Nontoxic multinodular goiter    FNA done 10/30/14 with benign results. Seen [name]. Patient may want a second opinion.  . Rhinitis, allergic   . Vitamin D deficiency     Medications:  Infusions:  . lactated ringers 150 mL/hr at 04/18/16 0400    Assessment: 49 yof cc abdominal pain s/p open sigmoid colectomy with  end colostomy, open drainage of pelvic abscess, appendectomy, placement of pelvic drains. Pharmacy consulted to replace electrolytes.  K 3.9, Mg 1.1, Phos 4.1  Goal of Therapy:  K 3.5 to 5 Mg 1.7 to 2.4 Phos 2.5 to 4.6  Plan:  Magnesium sulfate 4 gm IV x 1, recheck electrolytes with AM labs.  Carola Frost, Pharm.D., BCPS Clinical Pharmacist 04/18/2016,6:46 AM

## 2016-04-18 NOTE — Telephone Encounter (Signed)
I sent this to pulmonologist It was sent back to me I'll refill

## 2016-04-18 NOTE — Progress Notes (Addendum)
Surgeon on call paged to inform of Critical mag result of 1.1. Awaiting return call will continue to assess.

## 2016-04-19 LAB — CBC
HEMATOCRIT: 31 % — AB (ref 35.0–47.0)
Hemoglobin: 10.3 g/dL — ABNORMAL LOW (ref 12.0–16.0)
MCH: 28.5 pg (ref 26.0–34.0)
MCHC: 33.1 g/dL (ref 32.0–36.0)
MCV: 86.2 fL (ref 80.0–100.0)
Platelets: 253 10*3/uL (ref 150–440)
RBC: 3.59 MIL/uL — AB (ref 3.80–5.20)
RDW: 15.7 % — ABNORMAL HIGH (ref 11.5–14.5)
WBC: 14.4 10*3/uL — AB (ref 3.6–11.0)

## 2016-04-19 LAB — BASIC METABOLIC PANEL
ANION GAP: 5 (ref 5–15)
BUN: 14 mg/dL (ref 6–20)
CALCIUM: 7.4 mg/dL — AB (ref 8.9–10.3)
CO2: 26 mmol/L (ref 22–32)
Chloride: 105 mmol/L (ref 101–111)
Creatinine, Ser: 0.82 mg/dL (ref 0.44–1.00)
GFR calc Af Amer: >60 mL/min (ref >60)
GFR calc non Af Amer: >60 mL/min (ref >60)
GLUCOSE: 77 mg/dL (ref 65–99)
POTASSIUM: 3.7 mmol/L (ref 3.5–5.1)
Sodium: 136 mmol/L (ref 135–145)

## 2016-04-19 LAB — PHOSPHORUS: Phosphorus: 2.6 mg/dL (ref 2.5–4.6)

## 2016-04-19 LAB — MAGNESIUM: Magnesium: 1.9 mg/dL (ref 1.7–2.4)

## 2016-04-19 MED ORDER — ROPINIROLE HCL 1 MG PO TABS
2.0000 mg | ORAL_TABLET | Freq: Every day | ORAL | Status: DC
  Administered 2016-04-19 – 2016-04-28 (×10): 2 mg via ORAL
  Filled 2016-04-19 (×11): qty 2

## 2016-04-19 MED ORDER — CLONAZEPAM 0.5 MG PO TABS
0.5000 mg | ORAL_TABLET | Freq: Three times a day (TID) | ORAL | Status: DC
  Administered 2016-04-19 – 2016-04-29 (×31): 0.5 mg via ORAL
  Filled 2016-04-19 (×31): qty 1

## 2016-04-19 MED ORDER — LEVALBUTEROL HCL 1.25 MG/0.5ML IN NEBU: 1.2500 mg | INHALATION_SOLUTION | Freq: Three times a day (TID) | RESPIRATORY_TRACT | Status: DC | PRN

## 2016-04-19 MED ORDER — LAMOTRIGINE 25 MG PO TABS
150.0000 mg | ORAL_TABLET | Freq: Two times a day (BID) | ORAL | Status: DC
  Administered 2016-04-19 – 2016-04-29 (×21): 150 mg via ORAL
  Filled 2016-04-19 (×21): qty 2

## 2016-04-19 MED ORDER — NORTRIPTYLINE HCL 25 MG PO CAPS
100.0000 mg | ORAL_CAPSULE | Freq: Every day | ORAL | Status: DC
  Administered 2016-04-19 – 2016-04-28 (×10): 100 mg via ORAL
  Filled 2016-04-19 (×10): qty 4

## 2016-04-19 NOTE — Progress Notes (Signed)
2 Days Post-Op   Subjective:  Patient remained in stepdown status overnight. Continues to complain of abdominal pain with movement but has been getting some relief from her PCA. Denies passing flatus and has been tolerating ice chips without worsening nausea or vomiting.  Vital signs in last 24 hours: Temp:  [97.7 F (36.5 C)-98.1 F (36.7 C)] 98.1 F (36.7 C) (10/14 0800) Pulse Rate:  [95-106] 103 (10/14 0800) Resp:  [11-22] 14 (10/14 0917) BP: (97-128)/(59-93) 128/93 (10/14 0800) SpO2:  [95 %-99 %] 99 % (10/14 0917) FiO2 (%):  [32 %-99 %] 32 % (10/14 0917) Last BM Date:  (UT-Pt unsure)  Intake/Output from previous day: 10/13 0701 - 10/14 0700 In: 1800 [I.V.:1650; IV Piggyback:150] Out: 2130 [Urine:1620; Drains:510]  GI: Abdomen is soft, purple tender to palpation, nondistended. Dressing change today which shows an intact fascia but with the skin left wide open. This was redressed with a wet-to-dry Kerlix gauze. The end colostomy is having bowel sweat within the bag no evidence of gas or stool yet. No peritoneal signs or signs of abscess on exam.  Lab Results:  CBC  Recent Labs  04/18/16 0507 04/19/16 0401  WBC 16.6* 14.4*  HGB 11.2* 10.3*  HCT 33.6* 31.0*  PLT 306 253   CMP     Component Value Date/Time   NA 136 04/19/2016 0401   NA 138 03/11/2016 1527   NA 137 09/27/2014 1747   K 3.7 04/19/2016 0401   K 3.7 09/27/2014 1747   CL 105 04/19/2016 0401   CL 99 (L) 09/27/2014 1747   CO2 26 04/19/2016 0401   CO2 29 09/27/2014 1747   GLUCOSE 77 04/19/2016 0401   GLUCOSE 96 09/27/2014 1747   BUN 14 04/19/2016 0401   BUN 7 03/11/2016 1527   BUN 8 09/27/2014 1747   CREATININE 0.82 04/19/2016 0401   CREATININE 0.82 09/27/2014 1747   CALCIUM 7.4 (L) 04/19/2016 0401   CALCIUM 9.2 09/27/2014 1747   PROT 6.9 04/17/2016 0518   PROT 6.9 03/11/2016 1527   ALBUMIN 3.7 04/17/2016 0518   ALBUMIN 4.1 03/11/2016 1527   AST 28 04/17/2016 0518   ALT 18 04/17/2016 0518   ALKPHOS 79 04/17/2016 0518   BILITOT 0.4 04/17/2016 0518   BILITOT <0.2 03/11/2016 1527   GFRNONAA >60 04/19/2016 0401   GFRNONAA >60 09/27/2014 1747   GFRAA >60 04/19/2016 0401   GFRAA >60 09/27/2014 1747   PT/INR No results for input(s): LABPROT, INR in the last 72 hours.  Studies/Results: No results found.  Assessment/Plan: 50 year old female status post Hartman's procedure for perforated sigmoid colon. Continuing to slowly improve. Plan to transition back to the ward from the ICU today. Keep nothing by mouth except for ice chips, continue IV fluids and IV antibiotics, start twice daily dressing changes, encourage incentive spirometer usage. We will likely start clear liquid diet tomorrow if she continues to not have any nausea throughout today and tonight.   Ricarda Frameharles Laniece Hornbaker, MD FACS General Surgeon  04/19/2016

## 2016-04-19 NOTE — Progress Notes (Signed)
Morphine PCA  Is in use.  Patient is receiving supplemental oxygen at 1L nasal cannula.  Dressing is intact to abdomen.  Bilateral JP drains are present with the right producing more output.  Foley catheter is patent with adequate output.  Ice chips have been tolerated without nausea or vomiting. Dr. Tonita CongWoodham had given a verbal order for a sitter as needed. Patient had been cooperative and pleasant, but began to get more agitated between 1830-1845 tonight, and was wanting to leave. 1mg  of Iv ativan was administered and sitter is currently at the bedside.  Report was given at the bedside to the oncoming nurse.

## 2016-04-19 NOTE — Progress Notes (Signed)
MEDICATION RELATED CONSULT NOTE - INITIAL   Pharmacy Consult for electrolyte replacement and monitoring Indication: hypomagnesemia  No Known Allergies  Patient Measurements: Height: 5\' 9"  (175.3 cm) Weight: 203 lb 0.7 oz (92.1 kg) IBW/kg (Calculated) : 66.2 Adjusted Body Weight:   Vital Signs: Temp: 98 F (36.7 C) (10/14 0405) Temp Source: Oral (10/14 0405) BP: 112/87 (10/14 0600) Pulse Rate: 106 (10/14 0600) Intake/Output from previous day: 10/13 0701 - 10/14 0700 In: 1800 [I.V.:1650; IV Piggyback:150] Out: 2130 [Urine:1620; Drains:510] Intake/Output from this shift: No intake/output data recorded.  Labs:  Recent Labs  04/17/16 0518 04/17/16 2007 04/18/16 0507 04/19/16 0401  WBC 12.1* 17.1* 16.6* 14.4*  HGB 13.6 12.7 11.2* 10.3*  HCT 40.6 37.4 33.6* 31.0*  PLT 429 330 306 253  CREATININE 1.05* 1.14* 0.98 0.82  MG  --   --  1.1* 1.9  PHOS  --   --  4.1 2.6  ALBUMIN 3.7  --   --   --   PROT 6.9  --   --   --   AST 28  --   --   --   ALT 18  --   --   --   ALKPHOS 79  --   --   --   BILITOT 0.4  --   --   --    Estimated Creatinine Clearance: 102.6 mL/min (by C-G formula based on SCr of 0.82 mg/dL).   Microbiology: Recent Results (from the past 720 hour(s))  Urine culture     Status: Abnormal   Collection Time: 04/03/16  2:51 PM  Result Value Ref Range Status   Specimen Description URINE, CLEAN CATCH  Final   Special Requests NONE  Final   Culture >=100,000 COLONIES/mL KLEBSIELLA PNEUMONIAE (A)  Final   Report Status 04/05/2016 FINAL  Final   Organism ID, Bacteria KLEBSIELLA PNEUMONIAE (A)  Final      Susceptibility   Klebsiella pneumoniae - MIC*    AMPICILLIN 16 RESISTANT Resistant     CEFAZOLIN <=4 SENSITIVE Sensitive     CEFTRIAXONE <=1 SENSITIVE Sensitive     CIPROFLOXACIN <=0.25 SENSITIVE Sensitive     GENTAMICIN <=1 SENSITIVE Sensitive     IMIPENEM <=0.25 SENSITIVE Sensitive     NITROFURANTOIN <=16 SENSITIVE Sensitive     TRIMETH/SULFA <=20  SENSITIVE Sensitive     AMPICILLIN/SULBACTAM 4 SENSITIVE Sensitive     PIP/TAZO <=4 SENSITIVE Sensitive     Extended ESBL NEGATIVE Sensitive     * >=100,000 COLONIES/mL KLEBSIELLA PNEUMONIAE  MRSA PCR Screening     Status: None   Collection Time: 04/18/16  5:04 AM  Result Value Ref Range Status   MRSA by PCR NEGATIVE NEGATIVE Final    Comment:        The GeneXpert MRSA Assay (FDA approved for NASAL specimens only), is one component of a comprehensive MRSA colonization surveillance program. It is not intended to diagnose MRSA infection nor to guide or monitor treatment for MRSA infections.     Medical History: Past Medical History:  Diagnosis Date  . Anxiety   . Chronic constipation    on Linzess, Miralax, Zofran  . Chronic insomnia 11/02/2015  . COPD, moderate (HCC)   . Depression   . Depression with anxiety    Follow[facility] [name][facility] and [name] for thrapy  . History of chicken pox   . History of cocaine use   . History of pneumonia   . Hypothyroidism,  adult    Working with Dr. Horton Chin, Endocrinology. S/P RAIU and is planning on biopsy  . Migraine without status migrainosus, not intractable   . Nicotine dependence, uncomplicated   . Nontoxic multinodular goiter    FNA done 10/30/14 with benign results. Seen [name]. Patient may want a second opinion.  . Rhinitis, allergic   . Vitamin D deficiency     Medications:  Infusions:  . lactated ringers 150 mL/hr (04/18/16 2234)    Assessment: 49 yof cc abdominal pain s/p open sigmoid colectomy with end colostomy, open drainage of pelvic abscess, appendectomy, placement of pelvic drains. Pharmacy consulted to replace electrolytes.   Goal of Therapy:  K 3.5 to 5 Mg 1.7 to 2.4 Phos 2.5 to 4.6  Plan:  Electrolytes WNL. Will f/u AM labs.   Luisa Hart D, Pharm.D Clinical Pharmacist 04/19/2016,7:15 AM

## 2016-04-19 NOTE — Progress Notes (Signed)
[facility]

## 2016-04-19 NOTE — Progress Notes (Signed)
Patient asking questions about what we were doing to her . Patient's questions were answered and explained. Ativan iv was given for agitation and anxiety. Dressing to lower abdomen was changed. JP drain #2 was emptied. Patient's gown was changed. Patient laying in bed at this time no signs or symptoms of distress. Sitter at the bedside. Anselm Jungling

## 2016-04-19 NOTE — Progress Notes (Signed)
Patient's mother called with concerns about patient appearing confused and calling her 3 times at home. It was explained to patient's mother that patient was alert with some intermittent confusion. It was also explained that moving from room to room and being in the hospital for a period of time can cause some delirium or confusion. Patient's mother verbalized understanding and stated she " knew we were taking good care of her". Mother encouraged to call when ever she felt the need to. Anselm Junglingonyers,Naava Janeway M

## 2016-04-19 NOTE — Progress Notes (Signed)
Patient ID: Sabrina Espinoza, female   DOB: 1966/04/06, 50 y.o.   MRN: [id number]  [employer] PROGRESS NOTE  ANTOINETTE BORGWARDT WUJ:[id number] DOB: [birth year] DOA: 04/17/2016 PCP: Baruch Gouty, MD  HPI/Subjective: Patient having abdominal pain. Especially if she coughs or moves. Breathing comfortably. Offers no other complaints.  Objective: Vitals:   04/19/16 0500 04/19/16 0600  BP: 109/85 112/87  Pulse: (!) 103 (!) 106  Resp: 16 (!) 21  Temp:      Filed Weights   04/17/16 0458 04/18/16 0026  Weight: 82.6 kg (182 lb) 92.1 kg (203 lb 0.7 oz)    ROS: Review of Systems  Constitutional: Negative for chills and fever.  Eyes: Negative for blurred vision.  Respiratory: Negative for cough and shortness of breath.   Cardiovascular: Negative for chest pain.  Gastrointestinal: Positive for abdominal pain and constipation. Negative for diarrhea, nausea and vomiting.  Musculoskeletal: Negative for joint pain.  Neurological: Negative for dizziness and headaches.   Exam: Physical Exam  Constitutional: She is oriented to person, place, and time.  HENT:  Nose: No mucosal edema.  Mouth/Throat: No oropharyngeal exudate or posterior oropharyngeal edema.  Eyes: Conjunctivae, EOM and lids are normal. Pupils are equal, round, and reactive to light.  Neck: No JVD present. Carotid bruit is not present. No edema present. No thyroid mass and no thyromegaly present.  Cardiovascular: S1 normal and S2 normal.  Tachycardia present.  Exam reveals no gallop.   No murmur heard. Pulses:      Dorsalis pedis pulses are 2+ on the right side, and 2+ on the left side.  Respiratory: No respiratory distress. She has no wheezes. She has no rhonchi. She has no rales.  GI: Soft. She exhibits distension. Bowel sounds are decreased. There is generalized tenderness.  Musculoskeletal:       Right shoulder: She exhibits no swelling.       Right ankle: She exhibits swelling.       Left ankle: She exhibits swelling.   Lymphadenopathy:    She has no cervical adenopathy.  Neurological: She is alert and oriented to person, place, and time. No cranial nerve deficit.  Skin: Skin is warm. No rash noted. Nails show no clubbing.  Psychiatric: She has a normal mood and affect.      Data Reviewed: Basic Metabolic Panel:  Recent Labs Lab 04/17/16 0518 04/17/16 2007 04/18/16 0507 04/19/16 0401  NA 139 134* 137 136  K 3.4* 3.9 3.9 3.7  CL 102 104 107 105  CO2 27 22 24 26   GLUCOSE 136* 84 96 77  BUN 10 9 12 14   CREATININE 1.05* 1.14* 0.98 0.82  CALCIUM 9.9 8.7* 7.5* 7.4*  MG  --   --  1.1* 1.9  PHOS  --   --  4.1 2.6   Liver Function Tests:  Recent Labs Lab 04/17/16 0518  AST 28  ALT 18  ALKPHOS 79  BILITOT 0.4  PROT 6.9  ALBUMIN 3.7    Recent Labs Lab 04/17/16 0518  LIPASE 15   CBC:  Recent Labs Lab 04/17/16 0518 04/17/16 2007 04/18/16 0507 04/19/16 0401  WBC 12.1* 17.1* 16.6* 14.4*  NEUTROABS 9.9*  --   --   --   HGB 13.6 12.7 11.2* 10.3*  HCT 40.6 37.4 33.6* 31.0*  MCV 85.0 84.0 86.2 86.2  PLT 429 330 306 253   Cardiac Enzymes:  Recent Labs Lab 04/17/16 0518  TROPONINI <0.03   CBG:  Recent Labs Lab 04/18/16 0045  GLUCAP  106*    Recent Results (from the past 240 hour(s))  MRSA PCR Screening     Status: None   Collection Time: 04/18/16  5:04 AM  Result Value Ref Range Status   MRSA by PCR NEGATIVE NEGATIVE Final    Comment:        The GeneXpert MRSA Assay (FDA approved for NASAL specimens only), is one component of a comprehensive MRSA colonization surveillance program. It is not intended to diagnose MRSA infection nor to guide or monitor treatment for MRSA infections.      Studies: Ct Abdomen Pelvis W Contrast  Result Date: 04/17/2016 CLINICAL DATA:  50 year old female with increasing abdominal and pelvic pain with constipation 2 weeks. EXAM: CT ABDOMEN AND PELVIS WITH CONTRAST TECHNIQUE: Multidetector CT imaging of the abdomen and pelvis was  performed using the standard protocol following bolus administration of intravenous contrast. CONTRAST:  100 cc intravenous Isovue-300 COMPARISON:  04/14/2009 CT FINDINGS: Lower chest: Cardiomegaly and bibasilar atelectasis noted. Hepatobiliary: Hepatic steatosis identified without focal hepatic abnormality. Gallbladder is unremarkable. There is no evidence of biliary dilatation. Pancreas: Unremarkable Spleen: Unremarkable Adrenals/Urinary Tract: The kidneys, adrenal glands and bladder are unremarkable except for a punctate nonobstructing left upper pole renal calculus. Stomach/Bowel: There is circumferential wall thickening of the majority of the sigmoid colon which is likely inflammatory or infectious. There are several foci of pneumoperitoneum, primarily within the pelvis adjacent to the sigmoid colon. There is a moderate to large amount of stool throughout the colon. Small amount of free fluid within the abdomen and pelvis is identified. There is no evidence of small bowel obstruction or definite bowel mass. The appendix is normal. Vascular/Lymphatic: The visualized mesenteric arteries and veins are patent. Abdominal aortic atherosclerotic calcifications noted without aneurysm. No enlarged lymph nodes identified. Reproductive: Unremarkable Other: No definite focal abscess identified. Musculoskeletal: No acute or suspicious abnormality. IMPRESSION: Circumferential wall thickening of the sigmoid colon with several foci of pneumoperitoneum - likely sigmoid colitis with bowel perforation. Small amount of free fluid within the abdomen and pelvis. No evidence of small bowel obstruction. Hepatic steatosis. Cardiomegaly and bibasilar atelectasis. Abdominal aortic atherosclerosis. Punctate nonobstructing left upper pole renal calculus. Critical Value/emergent results were called by telephone at the time of interpretation on 04/17/2016 at 9:09 am to Dr. Mayford Knife, who verbally acknowledged these results. Electronically  Signed   By: Harmon Pier M.D.   On: 04/17/2016 09:11    Scheduled Meds: . enoxaparin (LOVENOX) injection  40 mg Subcutaneous Q24H  . famotidine (PEPCID) IV  20 mg Intravenous Q12H  . gabapentin  400 mg Oral TID  . hydrOXYzine  50 mg Oral BID  . ketorolac  30 mg Intravenous Q6H  . levalbuterol  1.25 mg Nebulization Q8H  . loratadine  10 mg Oral QPM  . mouth rinse  15 mL Mouth Rinse BID  . mometasone-formoterol  2 puff Inhalation BID  . montelukast  10 mg Oral QHS  . morphine   Intravenous Q4H  . nicotine  21 mg Transdermal Daily  . piperacillin-tazobactam (ZOSYN)  IV  3.375 g Intravenous Q8H   Continuous Infusions: . lactated ringers 150 mL/hr (04/18/16 2234)    Assessment/Plan:  1. COPD. Respiratory status stable. Continue inhaler and Xopenex treatments. 2. Perforated sigmoid diverticulitis requiring surgical intervention. Diet, activity up to surgeon. 3. Hypomagnesemia and hypokalemia replaced 4. Anxiety and depression. I would like to restart psychiatric medications 5. Tobacco abuse. Nicotine patch  Code Status:     Code Status Orders  Start     Ordered   04/17/16 1349  Full code  Continuous     04/17/16 1348    Code Status History    Date Active Date Inactive Code Status Order ID Comments User Context   This patient has a current code status but no historical code status.     Disposition Plan: as per surgeon  Consultants:  Gen. surgery  Procedures:  Open sigmoid colectomy with colostomy  Antibiotics:  Zosyn  Time spent: 25 minutes  Alford HighlandWIETING, Moncia Annas  [employer]

## 2016-04-20 LAB — BASIC METABOLIC PANEL
Anion gap: 9 (ref 5–15)
BUN: 11 mg/dL (ref 6–20)
CALCIUM: 7.7 mg/dL — AB (ref 8.9–10.3)
CO2: 21 mmol/L — AB (ref 22–32)
CREATININE: 0.82 mg/dL (ref 0.44–1.00)
Chloride: 106 mmol/L (ref 101–111)
GFR calc Af Amer: >60 mL/min (ref >60)
GFR calc non Af Amer: >60 mL/min (ref >60)
GLUCOSE: 53 mg/dL — AB (ref 65–99)
Potassium: 3.7 mmol/L (ref 3.5–5.1)
Sodium: 136 mmol/L (ref 135–145)

## 2016-04-20 LAB — CBC
HEMATOCRIT: 30.5 % — AB (ref 35.0–47.0)
Hemoglobin: 10.1 g/dL — ABNORMAL LOW (ref 12.0–16.0)
MCH: 28.5 pg (ref 26.0–34.0)
MCHC: 33.2 g/dL (ref 32.0–36.0)
MCV: 85.8 fL (ref 80.0–100.0)
Platelets: 295 10*3/uL (ref 150–440)
RBC: 3.55 MIL/uL — ABNORMAL LOW (ref 3.80–5.20)
RDW: 15.1 % — AB (ref 11.5–14.5)
WBC: 12.9 10*3/uL — ABNORMAL HIGH (ref 3.6–11.0)

## 2016-04-20 LAB — MAGNESIUM: Magnesium: 1.8 mg/dL (ref 1.7–2.4)

## 2016-04-20 LAB — PHOSPHORUS: Phosphorus: 2.9 mg/dL (ref 2.5–4.6)

## 2016-04-20 MED ORDER — POTASSIUM CHLORIDE 10 MEQ/100ML IV SOLN
10.0000 meq | INTRAVENOUS | Status: AC
  Administered 2016-04-20 (×3): 10 meq via INTRAVENOUS
  Filled 2016-04-20 (×3): qty 100

## 2016-04-20 MED ORDER — MAGNESIUM SULFATE 2 GM/50ML IV SOLN
2.0000 g | Freq: Once | INTRAVENOUS | Status: AC
  Administered 2016-04-20: 2 g via INTRAVENOUS
  Filled 2016-04-20: qty 50

## 2016-04-20 NOTE — Progress Notes (Signed)
  Revisiting with patient. Midline dressing taken off with healthy-appearing tissue. No evidence of infection to any level of the midline wound. Performed a delayed primary closure of midline wound over a large Penrose drain. This was done with surgical staples. Patient tolerated the closure well.  Instructions nurse to continue daily wound care with dressing changes of ABD only on top of the now closed wound.  Sabrina Frameharles Akansha Wyche, MD [facility] General Surgeon [facility]

## 2016-04-20 NOTE — Progress Notes (Signed)
3 Days Post-Op   Subjective:  Patient reports she had a good night per nursing staff she was intimately confused. Requiring a sitter been doing well on the ward. She states again that she is hungry and she's not had any nausea or vomiting. Pain currently well controlled with when necessary medications as well as PCA.  Vital signs in last 24 hours: Temp:  [98.1 F (36.7 C)-99.5 F (37.5 C)] 98.2 F (36.8 C) (10/15 0431) Pulse Rate:  [101-123] 108 (10/15 0431) Resp:  [14-28] 18 (10/15 0800) BP: (112-141)/(81-96) 121/82 (10/15 0431) SpO2:  [91 %-100 %] 94 % (10/15 0800) FiO2 (%):  [0 %-99 %] 0 % (10/15 0400) Weight:  [96.3 kg (212 lb 3.2 oz)] 96.3 kg (212 lb 3.2 oz) (10/15 0431) Last BM Date:  (UT-Pt unsure)  Intake/Output from previous day: 10/14 0701 - 10/15 0700 In: 3625.9 [I.V.:3261.5; IV Piggyback:134.4] Out: 1380 [Urine:1000; Drains:260; Stool:120]  GI: Abdomen soft, purple tender to palpation around her midline incision. Dressing is currently clean, dry, intact. Ostomy present left lower quadrant that is pink and productive of some gas and liquid but no stool yet.  Lab Results:  CBC  Recent Labs  04/19/16 0401 04/20/16 0405  WBC 14.4* 12.9*  HGB 10.3* 10.1*  HCT 31.0* 30.5*  PLT 253 295   CMP     Component Value Date/Time   NA 136 04/20/2016 0405   NA 138 03/11/2016 1527   NA 137 09/27/2014 1747   K 3.7 04/20/2016 0405   K 3.7 09/27/2014 1747   CL 106 04/20/2016 0405   CL 99 (L) 09/27/2014 1747   CO2 21 (L) 04/20/2016 0405   CO2 29 09/27/2014 1747   GLUCOSE 53 (L) 04/20/2016 0405   GLUCOSE 96 09/27/2014 1747   BUN 11 04/20/2016 0405   BUN 7 03/11/2016 1527   BUN 8 09/27/2014 1747   CREATININE 0.82 04/20/2016 0405   CREATININE 0.82 09/27/2014 1747   CALCIUM 7.7 (L) 04/20/2016 0405   CALCIUM 9.2 09/27/2014 1747   PROT 6.9 04/17/2016 0518   PROT 6.9 03/11/2016 1527   ALBUMIN 3.7 04/17/2016 0518   ALBUMIN 4.1 03/11/2016 1527   AST 28 04/17/2016 0518   ALT 18 04/17/2016 0518   ALKPHOS 79 04/17/2016 0518   BILITOT 0.4 04/17/2016 0518   BILITOT <0.2 03/11/2016 1527   GFRNONAA >60 04/20/2016 0405   GFRNONAA >60 09/27/2014 1747   GFRAA >60 04/20/2016 0405   GFRAA >60 09/27/2014 1747   PT/INR No results for input(s): LABPROT, INR in the last 72 hours.  Studies/Results: No results found.  Assessment/Plan: 50 year old female now postoperative day #2 from export her laparotomy for perforated sigmoid colon. Doing well. Remove NG tube today and start a clear liquid diet. Counseled patient that should she have any nausea at all she has to stop eating and she voiced understanding. We will attempt later today to perform a delayed primary closure of part of her midline wound.   [name], MD FACS General Surgeon  04/20/2016

## 2016-04-20 NOTE — Progress Notes (Signed)
Patient ID: Sabrina Espinoza, female   DOB: 03-10-1966, 50 y.o.   MRN: [id number]  [employer] PROGRESS NOTE  Sabrina Rasudrey E Cattell NWG:[id number] DOB: 03-10-1966 DOA: 04/17/2016 PCP: Baruch GoutyMelinda Lada, MD  HPI/Subjective: Patient feeling better. Still having abdominal pain. Still no stool output through ostomy.  Some cough.  Objective: Vitals:   04/20/16 0800 04/20/16 1139  BP:  129/86  Pulse:  (!) 106  Resp: 18 20  Temp:  98.5 F (36.9 C)    Filed Weights   04/17/16 0458 04/18/16 0026 04/20/16 0431  Weight: 82.6 kg (182 lb) 92.1 kg (203 lb 0.7 oz) 96.3 kg (212 lb 3.2 oz)    ROS: Review of Systems  Constitutional: Negative for chills and fever.  Eyes: Negative for blurred vision.  Respiratory: Positive for cough. Negative for shortness of breath.   Cardiovascular: Negative for chest pain.  Gastrointestinal: Positive for abdominal pain and constipation. Negative for diarrhea, nausea and vomiting.  Musculoskeletal: Negative for joint pain.  Neurological: Negative for dizziness and headaches.   Exam: Physical Exam  Constitutional: She is oriented to person, place, and time.  HENT:  Nose: No mucosal edema.  Mouth/Throat: No oropharyngeal exudate or posterior oropharyngeal edema.  Eyes: Conjunctivae, EOM and lids are normal. Pupils are equal, round, and reactive to light.  Neck: No JVD present. Carotid bruit is not present. No edema present. No thyroid mass and no thyromegaly present.  Cardiovascular: S1 normal and S2 normal.  Tachycardia present.  Exam reveals no gallop.   No murmur heard. Pulses:      Dorsalis pedis pulses are 2+ on the right side, and 2+ on the left side.  Respiratory: No respiratory distress. She has no wheezes. She has no rhonchi. She has no rales.  GI: Soft. She exhibits distension. Bowel sounds are decreased. There is generalized tenderness.  Musculoskeletal:       Right shoulder: She exhibits no swelling.       Right ankle: She exhibits swelling.   Left ankle: She exhibits swelling.  Lymphadenopathy:    She has no cervical adenopathy.  Neurological: She is alert and oriented to person, place, and time. No cranial nerve deficit.  Skin: Skin is warm. No rash noted. Nails show no clubbing.  Psychiatric: She has a normal mood and affect.      Data Reviewed: Basic Metabolic Panel:  Recent Labs Lab 04/17/16 0518 04/17/16 2007 04/18/16 0507 04/19/16 0401 04/20/16 0405  NA 139 134* 137 136 136  K 3.4* 3.9 3.9 3.7 3.7  CL 102 104 107 105 106  CO2 27 22 24 26  21*  GLUCOSE 136* 84 96 77 53*  BUN 10 9 12 14 11   CREATININE 1.05* 1.14* 0.98 0.82 0.82  CALCIUM 9.9 8.7* 7.5* 7.4* 7.7*  MG  --   --  1.1* 1.9 1.8  PHOS  --   --  4.1 2.6 2.9   Liver Function Tests:  Recent Labs Lab 04/17/16 0518  AST 28  ALT 18  ALKPHOS 79  BILITOT 0.4  PROT 6.9  ALBUMIN 3.7    Recent Labs Lab 04/17/16 0518  LIPASE 15   CBC:  Recent Labs Lab 04/17/16 0518 04/17/16 2007 04/18/16 0507 04/19/16 0401 04/20/16 0405  WBC 12.1* 17.1* 16.6* 14.4* 12.9*  NEUTROABS 9.9*  --   --   --   --   HGB 13.6 12.7 11.2* 10.3* 10.1*  HCT 40.6 37.4 33.6* 31.0* 30.5*  MCV 85.0 84.0 86.2 86.2 85.8  PLT 429 330  306 253 295   Cardiac Enzymes:  Recent Labs Lab 04/17/16 0518  TROPONINI <0.03   CBG:  Recent Labs Lab 04/18/16 0045  GLUCAP 106*    Recent Results (from the past 240 hour(s))  MRSA PCR Screening     Status: None   Collection Time: 04/18/16  5:04 AM  Result Value Ref Range Status   MRSA by PCR NEGATIVE NEGATIVE Final    Comment:        The GeneXpert MRSA Assay (FDA approved for NASAL specimens only), is one component of a comprehensive MRSA colonization surveillance program. It is not intended to diagnose MRSA infection nor to guide or monitor treatment for MRSA infections.      Scheduled Meds: . clonazePAM  0.5 mg Oral TID  . enoxaparin (LOVENOX) injection  40 mg Subcutaneous Q24H  . famotidine (PEPCID) IV  20 mg  Intravenous Q12H  . gabapentin  400 mg Oral TID  . hydrOXYzine  50 mg Oral BID  . ketorolac  30 mg Intravenous Q6H  . lamoTRIgine  150 mg Oral BID  . loratadine  10 mg Oral QPM  . mometasone-formoterol  2 puff Inhalation BID  . montelukast  10 mg Oral QHS  . morphine   Intravenous Q4H  . nicotine  21 mg Transdermal Daily  . nortriptyline  100 mg Oral QHS  . piperacillin-tazobactam (ZOSYN)  IV  3.375 g Intravenous Q8H  . potassium chloride  10 mEq Intravenous Q1 Hr x 3  . rOPINIRole  2 mg Oral QHS   Continuous Infusions: . lactated ringers 150 mL/hr at 04/20/16 0904    Assessment/Plan:  1. COPD. Respiratory status stable. Continue inhaler and Xopenex treatments. 2. Perforated sigmoid diverticulitis requiring surgical intervention.  Patient started on clear liquid diet. Still on PCA pump for pain management.  Patient on IV Zosyn. 3. Hypomagnesemia and hypokalemia. IV potassium being given this morning. 4. Anxiety and depression.   I restarted oral psychiatric medications yesterday. 5. Tobacco abuse. Nicotine patch 6. Physical therapy evaluation  Code Status:     Code Status Orders        Start     Ordered   04/17/16 1349  Full code  Continuous     04/17/16 1348    Code Status History    Date Active Date Inactive Code Status Order ID Comments User Context   This patient has a current code status but no historical code status.     Disposition Plan: as per surgeon  Consultants:  Gen. surgery  Procedures:  Open sigmoid colectomy with colostomy  Antibiotics:  Zosyn  Time spent: 26 minutes. Case discussed with mother at the bedside.  Alford Highland  [employer]

## 2016-04-20 NOTE — Evaluation (Signed)
Physical Therapy Evaluation Patient Details Name: Sabrina Espinoza MRN: [id number] DOB: 1966-04-22 Today's Date: 04/20/2016   History of Present Illness  Sabrina Espinoza is a 49yo white female who comes to [facility] on 10/12 with ABD pain, and later that day required colectomy, cyst drainage, apendectomy, and placement of 2 pelvic drainage tubes. At baseline, pt is fully independent, lives alone, without mobility restrictions or limitations. PMH: COPD.   Clinical Impression  Upon entry, the patient is received semirecumbent in bed, asleep with family/visitor present. The pt is easily awakened and agreeable to participate. No acute distress noted at this time. VSS very unstable during gait trial. Pt received on and remaining on 1L O2 and moved to RA at rest without complication, however throughout evaluation, howevere, patient is unable to maintain sats>88% even on 4L/min, with rapid drop to <80%, which correlates with HR increasing to 230s. Functional mobility assessment demonstrates minimal impairment, mostly impaired by pain inhibition. Pt is able to perform all mobility at supervision level.  Patient presenting with impairment of strength and activity tolerance, limiting ability to perform ADL and mobility tasks at  baseline level of function. Patient will benefit from skilled intervention to address the above impairments and limitations, in order to restore to prior level of function, improve patient safety upon discharge, and to decrease falls risk.       Follow Up Recommendations Home health PT;Outpatient PT;Supervision - Intermittent (DC placement may be more determined by medical need/nursing services. )    Equipment Recommendations       Recommendations for Other Services       Precautions / Restrictions Precautions Precautions: Fall Precaution Comments: lots and lots of lines/leads, lower central ABD incision, drainage tubes.  Restrictions Weight Bearing Restrictions: No       Mobility  Bed Mobility Overal bed mobility: Modified Independent                Transfers Overall transfer level: Modified independent                  Ambulation/Gait Ambulation/Gait assistance: Supervision Ambulation Distance (Feet): 30 Feet         General Gait Details: unable to maintain stable SaO2, even on 4L/min, HR increasing 180-230s. No subjective complaint from patient. Vitals taken from PCA monitor.   Stairs            Wheelchair Mobility    Modified Rankin (Stroke Patients Only)       Balance Overall balance assessment: No apparent balance deficits (not formally assessed);Modified Independent                                           Pertinent Vitals/Pain Pain Assessment: 0-10 Pain Score: 8  Pain Location: ABD Pain Intervention(s): Limited activity within patient's tolerance;Monitored during session;Premedicated before session;Repositioned    Home Living Family/patient expects to be discharged to:: Private residence Living Arrangements: Alone Available Help at Discharge: Family;Friend(s) Type of Home: Apartment Home Access: Level entry     Home Layout: One level        Prior Function Level of Independence: Independent               Hand Dominance        Extremity/Trunk Assessment   Upper Extremity Assessment: Overall WFL for tasks assessed           Lower Extremity Assessment: Overall  WFL for tasks assessed         Communication   Communication: No difficulties  Cognition Arousal/Alertness: Awake/alert Behavior During Therapy: WFL for tasks assessed/performed Overall Cognitive Status: Within Functional Limits for tasks assessed                      General Comments      Exercises     Assessment/Plan    PT Assessment Patient needs continued PT services  PT Problem List Decreased strength;Decreased activity tolerance;Decreased mobility;Cardiopulmonary status limiting  activity;Pain          PT Treatment Interventions Gait training;Functional mobility training;Therapeutic activities;Therapeutic exercise;Balance training;Patient/family education    PT Goals (Current goals can be found in the Care Plan section)  Acute Rehab PT Goals Patient Stated Goal: Improve AMB and independence PT Goal Formulation: With patient Time For Goal Achievement: 05/04/16 Potential to Achieve Goals: Good    Frequency Min 2X/week   Barriers to discharge Decreased caregiver support      Co-evaluation               End of Session Equipment Utilized During Treatment: Oxygen;Gait belt Activity Tolerance: Patient tolerated treatment well;Treatment limited secondary to medical complications (Comment) Patient left: in bed;with call bell/phone within reach;with bed alarm set;with nursing/sitter in room;with family/visitor present Nurse Communication: Mobility status;Patient requests pain meds         Time: 1431-1453 PT Time Calculation (min) (ACUTE ONLY): 22 min   Charges:   PT Evaluation $PT Eval Moderate Complexity: 1 Procedure     PT G Codes:        3:08 PM, 04/20/16 Rosamaria LintsAllan C Buccola, PT, DPT Physical Therapist - [place] [phone](ASCOM)  [phone] (mobile)

## 2016-04-20 NOTE — Progress Notes (Signed)
MEDICATION RELATED CONSULT NOTE - INITIAL   Pharmacy Consult for electrolyte replacement and monitoring Indication: hypomagnesemia  No Known Allergies  Patient Measurements: Height: 5\' 9"  (175.3 cm) Weight: 212 lb 3.2 oz (96.3 kg) IBW/kg (Calculated) : 66.2 Adjusted Body Weight:   Vital Signs: Temp: 98.2 F (36.8 C) (10/15 0431) Temp Source: Oral (10/15 0431) BP: 121/82 (10/15 0431) Pulse Rate: 108 (10/15 0431) Intake/Output from previous day: 10/14 0701 - 10/15 0700 In: 3575.9 [I.V.:3261.5; IV Piggyback:84.4] Out: 1380 [Urine:1000; Drains:260; Stool:120] Intake/Output from this shift: No intake/output data recorded.  Labs:  Recent Labs  04/18/16 0507 04/19/16 0401 04/20/16 0405  WBC 16.6* 14.4* 12.9*  HGB 11.2* 10.3* 10.1*  HCT 33.6* 31.0* 30.5*  PLT 306 253 295  CREATININE 0.98 0.82 0.82  MG 1.1* 1.9 1.8  PHOS 4.1 2.6 2.9   Estimated Creatinine Clearance: 104.7 mL/min (by C-G formula based on SCr of 0.82 mg/dL).   Microbiology: Recent Results (from the past 720 hour(s))  Urine culture     Status: Abnormal   Collection Time: 04/03/16  2:51 PM  Result Value Ref Range Status   Specimen Description URINE, CLEAN CATCH  Final   Special Requests NONE  Final   Culture >=100,000 COLONIES/mL KLEBSIELLA PNEUMONIAE (A)  Final   Report Status 04/05/2016 FINAL  Final   Organism ID, Bacteria KLEBSIELLA PNEUMONIAE (A)  Final      Susceptibility   Klebsiella pneumoniae - MIC*    AMPICILLIN 16 RESISTANT Resistant     CEFAZOLIN <=4 SENSITIVE Sensitive     CEFTRIAXONE <=1 SENSITIVE Sensitive     CIPROFLOXACIN <=0.25 SENSITIVE Sensitive     GENTAMICIN <=1 SENSITIVE Sensitive     IMIPENEM <=0.25 SENSITIVE Sensitive     NITROFURANTOIN <=16 SENSITIVE Sensitive     TRIMETH/SULFA <=20 SENSITIVE Sensitive     AMPICILLIN/SULBACTAM 4 SENSITIVE Sensitive     PIP/TAZO <=4 SENSITIVE Sensitive     Extended ESBL NEGATIVE Sensitive     * >=100,000 COLONIES/mL KLEBSIELLA PNEUMONIAE   MRSA PCR Screening     Status: None   Collection Time: 04/18/16  5:04 AM  Result Value Ref Range Status   MRSA by PCR NEGATIVE NEGATIVE Final    Comment:        The GeneXpert MRSA Assay (FDA approved for NASAL specimens only), is one component of a comprehensive MRSA colonization surveillance program. It is not intended to diagnose MRSA infection nor to guide or monitor treatment for MRSA infections.     Medical History: Past Medical History:  Diagnosis Date  . Anxiety   . Chronic constipation    on Linzess, Miralax, Zofran  . Chronic insomnia 11/02/2015  . COPD, moderate (HCC)   . Depression   . Depression with anxiety    [facility] [name][facility] and [name]  . History of chicken pox   . History of cocaine use   . History of pneumonia   . Hypothyroidism, adult    Working with Dr. Horton ChinShamil [name], Endocrinology. S/P RAIU and is planning on biopsy  . Migraine without status migrainosus, not intractable   . Nicotine dependence, uncomplicated   . Nontoxic multinodular goiter    FNA done 10/30/14 with benign results. Seen [name]. Patient may want a second opinion.  . Rhinitis, allergic   . Vitamin D deficiency     Medications:  Infusions:  . lactated ringers 150 mL/hr at 04/20/16 0036    Assessment: 49 yof cc abdominal  pain s/p open sigmoid colectomy with end colostomy, open drainage of pelvic abscess, appendectomy, placement of pelvic drains. Pharmacy consulted to replace electrolytes.   Goal of Therapy:  K 3.5 to 5 Mg 1.7 to 2.4 Phos 2.5 to 4.6  Plan:  Electrolytes WNL. Will recheck labs in 48 hours.   Luisa Hart D, Pharm.D Clinical Pharmacist 04/20/2016,7:05 AM

## 2016-04-20 NOTE — Care Management Important Message (Signed)
Important Message  Patient Details  Name: Sabrina Espinoza MRN: [id number] Date of Birth: 15-Jan-1966   Medicare Important Message Given:  Yes    Panda Crossin A, RN 04/20/2016, 2:53 PM

## 2016-04-21 ENCOUNTER — Inpatient Hospital Stay: Payer: Medicare Other

## 2016-04-21 LAB — BASIC METABOLIC PANEL
Anion gap: 4 — ABNORMAL LOW (ref 5–15)
BUN: 6 mg/dL (ref 6–20)
CALCIUM: 7.9 mg/dL — AB (ref 8.9–10.3)
CHLORIDE: 104 mmol/L (ref 101–111)
CO2: 25 mmol/L (ref 22–32)
CREATININE: 0.78 mg/dL (ref 0.44–1.00)
Glucose, Bld: 79 mg/dL (ref 65–99)
Potassium: 3.7 mmol/L (ref 3.5–5.1)
SODIUM: 133 mmol/L — AB (ref 135–145)

## 2016-04-21 LAB — SURGICAL PATHOLOGY

## 2016-04-21 MED ORDER — DILTIAZEM HCL ER COATED BEADS 180 MG PO CP24
180.0000 mg | ORAL_CAPSULE | Freq: Every day | ORAL | Status: DC
  Administered 2016-04-21 – 2016-04-29 (×9): 180 mg via ORAL
  Filled 2016-04-21 (×9): qty 1

## 2016-04-21 MED ORDER — ACETAMINOPHEN 325 MG PO TABS
650.0000 mg | ORAL_TABLET | Freq: Four times a day (QID) | ORAL | Status: DC | PRN
  Administered 2016-04-21 – 2016-04-22 (×2): 650 mg via ORAL
  Filled 2016-04-21 (×2): qty 2

## 2016-04-21 NOTE — Progress Notes (Signed)
Notified of the patient's fever. Patient describes minimal if any abdominal pain certainly no worsening of her postop status.  Low-grade temp noted. Chest shows bilateral rhonchi and wheezes in the upper fields. Abdominal exam unchanged.  Assessment plan patient was not aware that she was to be using the incentive spirometer and in fact did not know what it was. I instructed her and asked the RN to instruct the patient on continued and proper use of the incentive spirometry. I suspect her fever is coming from her lung fields as opposed to her abdomen at this point

## 2016-04-21 NOTE — Progress Notes (Signed)
Chaplain visited Pt who had a family member in the room. Pt, as the family said, was depressed due to health changes, and needed spiritual support according to a family member, whom chaplain did not identify by name. Chaplain asked Pt if he could pray for her, and Pt answer yes, and prayer was provided for her healing and emotional strength.     04/21/16 1700  Clinical Encounter Type  Visited With Patient  Visit Type Initial  Referral From Nurse  Spiritual Encounters  Spiritual Needs Prayer;Other (Comment)  Stress Factors  Patient Stress Factors Health changes;Other (Comment)

## 2016-04-21 NOTE — Consult Note (Signed)
WOC Nurse ostomy follow up Stoma type/location: LLQ Colostomy Stomal assessment/size: 2 1/4" well budded, pin and moist, edematous. Light brown liquid noted in pouch. Peristomal assessment: Small area of denuded skin medical adhesive related skin injury(MARSI), covered with thin coat of stoma powder, Pouch changed with two-piece system.  Treatment options for stomal/peristomal skin: Stoma powder, crusting  Output Light brown liquid Ostomy pouching: 2pc. 2 3/4" Education provided: Changed pouch with family member at bedside. Daughter cut out pouch opening. Educated on frequency of pouch changed and emptying of pouch when it is 1/3 full to prevent losing seal.  [place] [facility] [place]: Yes/ will enroll today. WOC team will follow and remain available to patient, medical and nursing teams.  Maple HudsonKaren Alegandro Macnaughton RN BSN CWON Pager [phone]

## 2016-04-21 NOTE — Care Management (Signed)
Anticipate that patient will require home health RN and PT at discharge At this time patient is requiring acute O2.  Unable to assess patient at this time.  Agitated, and as prn dose of ativan

## 2016-04-21 NOTE — Progress Notes (Signed)
Patient ID: Sabrina Espinoza, female   DOB: 1966-03-15, 50 y.o.   MRN: [id number]   [employer] PROGRESS NOTE  Sabrina Espinoza FAO:[id number] DOB: 1966-03-15 DOA: 04/17/2016 PCP: Baruch GoutyMelinda Lada, MD  HPI/Subjective: Patient still having abdominal pain. No nausea or vomiting. Tolerating the liquids. No output through the colostomy yet. Had a fever.  Objective: Vitals:   04/21/16 1200 04/21/16 1329  BP:    Pulse:    Resp: (!) 27   Temp:  (!) 101.2 F (38.4 C)    Filed Weights   04/17/16 0458 04/18/16 0026 04/20/16 0431  Weight: 82.6 kg (182 lb) 92.1 kg (203 lb 0.7 oz) 96.3 kg (212 lb 3.2 oz)    ROS: Review of Systems  Constitutional: Negative for chills and fever.  Eyes: Negative for blurred vision.  Respiratory: Positive for cough. Negative for shortness of breath.   Cardiovascular: Negative for chest pain.  Gastrointestinal: Positive for abdominal pain and constipation. Negative for diarrhea, nausea and vomiting.  Musculoskeletal: Negative for joint pain.  Neurological: Negative for dizziness and headaches.   Exam: Physical Exam  Constitutional: She is oriented to person, place, and time.  HENT:  Nose: No mucosal edema.  Mouth/Throat: No oropharyngeal exudate or posterior oropharyngeal edema.  Eyes: Conjunctivae, EOM and lids are normal. Pupils are equal, round, and reactive to light.  Neck: No JVD present. Carotid bruit is not present. No edema present. No thyroid mass and no thyromegaly present.  Cardiovascular: S1 normal and S2 normal.  Tachycardia present.  Exam reveals no gallop.   No murmur heard. Pulses:      Dorsalis pedis pulses are 2+ on the right side, and 2+ on the left side.  Respiratory: No respiratory distress. She has no wheezes. She has no rhonchi. She has no rales.  GI: Soft. She exhibits distension. Bowel sounds are decreased. There is generalized tenderness.  Musculoskeletal:       Right shoulder: She exhibits no swelling.       Right ankle: She  exhibits swelling.       Left ankle: She exhibits swelling.  Lymphadenopathy:    She has no cervical adenopathy.  Neurological: She is alert and oriented to person, place, and time. No cranial nerve deficit.  Skin: Skin is warm. No rash noted. Nails show no clubbing.  Psychiatric: She has a normal mood and affect.      Data Reviewed: Basic Metabolic Panel:  Recent Labs Lab 04/17/16 2007 04/18/16 0507 04/19/16 0401 04/20/16 0405 04/21/16 0422  NA 134* 137 136 136 133*  K 3.9 3.9 3.7 3.7 3.7  CL 104 107 105 106 104  CO2 22 24 26  21* 25  GLUCOSE 84 96 77 53* 79  BUN 9 12 14 11 6   CREATININE 1.14* 0.98 0.82 0.82 0.78  CALCIUM 8.7* 7.5* 7.4* 7.7* 7.9*  MG  --  1.1* 1.9 1.8  --   PHOS  --  4.1 2.6 2.9  --    Liver Function Tests:  Recent Labs Lab 04/17/16 0518  AST 28  ALT 18  ALKPHOS 79  BILITOT 0.4  PROT 6.9  ALBUMIN 3.7    Recent Labs Lab 04/17/16 0518  LIPASE 15   CBC:  Recent Labs Lab 04/17/16 0518 04/17/16 2007 04/18/16 0507 04/19/16 0401 04/20/16 0405  WBC 12.1* 17.1* 16.6* 14.4* 12.9*  NEUTROABS 9.9*  --   --   --   --   HGB 13.6 12.7 11.2* 10.3* 10.1*  HCT 40.6 37.4 33.6* 31.0*  30.5*  MCV 85.0 84.0 86.2 86.2 85.8  PLT 429 330 306 253 295   Cardiac Enzymes:  Recent Labs Lab 04/17/16 0518  TROPONINI <0.03   CBG:  Recent Labs Lab 04/18/16 0045  GLUCAP 106*    Recent Results (from the past 240 hour(s))  MRSA PCR Screening     Status: None   Collection Time: 04/18/16  5:04 AM  Result Value Ref Range Status   MRSA by PCR NEGATIVE NEGATIVE Final    Comment:        The GeneXpert MRSA Assay (FDA approved for NASAL specimens only), is one component of a comprehensive MRSA colonization surveillance program. It is not intended to diagnose MRSA infection nor to guide or monitor treatment for MRSA infections.      Scheduled Meds: . clonazePAM  0.5 mg Oral TID  . diltiazem  180 mg Oral Daily  . enoxaparin (LOVENOX) injection   40 mg Subcutaneous Q24H  . famotidine (PEPCID) IV  20 mg Intravenous Q12H  . gabapentin  400 mg Oral TID  . hydrOXYzine  50 mg Oral BID  . ketorolac  30 mg Intravenous Q6H  . lamoTRIgine  150 mg Oral BID  . loratadine  10 mg Oral QPM  . mometasone-formoterol  2 puff Inhalation BID  . montelukast  10 mg Oral QHS  . morphine   Intravenous Q4H  . nicotine  21 mg Transdermal Daily  . nortriptyline  100 mg Oral QHS  . piperacillin-tazobactam (ZOSYN)  IV  3.375 g Intravenous Q8H  . rOPINIRole  2 mg Oral QHS   Continuous Infusions: . lactated ringers 75 mL/hr at 04/21/16 1326    Assessment/Plan:  1. COPD. Respiratory status stable. Continue inhaler and Xopenex treatments. Chest x-ray with fever is negative. 2. Perforated sigmoid diverticulitis requiring surgical intervention.  Patient started on clear liquid diet. Still on PCA pump for pain management.  Patient on IV Zosyn.  Fever could be postoperatively or abdominal source. 3. Hypomagnesemia and hypokalemia. Replaced. 4. Anxiety and depression.   Continue oral psychiatric medications. 5. Tobacco abuse. Nicotine patch. 6. Physical therapy evaluation 7. Sinus tachycardia at rest and even higher with ambulation. This could happen with pain and fever. I did start Cardizem CD.  Code Status:     Code Status Orders        Start     Ordered   04/17/16 1349  Full code  Continuous     04/17/16 1348    Code Status History    Date Active Date Inactive Code Status Order ID Comments User Context   This patient has a current code status but no historical code status.     Disposition Plan: as per surgeon  Consultants:  Gen. surgery  Procedures:  Open sigmoid colectomy with colostomy  Antibiotics:  Zosyn  Time spent: 25 minutes. Case discussed with family at the bedside.  Alford Highland  [employer]

## 2016-04-21 NOTE — Progress Notes (Signed)
Notified Attending MD and surgeon of patient elevated temperature. PRN tylenol given. Continue to assess

## 2016-04-21 NOTE — Progress Notes (Signed)
4 Days Post-Op  Subjective: This patient status post Hartman's procedure for then returned to the operating room for delayed primary closure. She is on clear liquids and tolerating that but has not had much ostomy output. No other complaints.  Objective: Vital signs in last 24 hours: Temp:  [98.4 F (36.9 C)-101.4 F (38.6 C)] 98.4 F (36.9 C) (10/16 0434) Pulse Rate:  [106-235] 114 (10/16 0434) Resp:  [16-25] 19 (10/16 0434) BP: (129-145)/(86-97) 139/97 (10/16 0434) SpO2:  [84 %-100 %] 97 % (10/16 0434) FiO2 (%):  [0 %] 0 % (10/16 0400) Last BM Date:  (pt unable to state)  Intake/Output from previous day: 10/15 0701 - 10/16 0700 In: 3251.5 [P.O.:180; I.V.:2321.5; IV Piggyback:620] Out: 1580 [Urine:1300; Drains:160; Stool:120] Intake/Output this shift: Total I/O In: 141 [I.V.:141] Out: -   Physical exam:  Wound is clean without erythema or drainage Penrose is in place abdomen is soft nontender the ostomy is edematous, viable with gaseous output but no stool in bag. Calves are nontender  Lab Results: CBC   Recent Labs  04/19/16 0401 04/20/16 0405  WBC 14.4* 12.9*  HGB 10.3* 10.1*  HCT 31.0* 30.5*  PLT 253 295   BMET  Recent Labs  04/20/16 0405 04/21/16 0422  NA 136 133*  K 3.7 3.7  CL 106 104  CO2 21* 25  GLUCOSE 53* 79  BUN 11 6  CREATININE 0.82 0.78  CALCIUM 7.7* 7.9*   PT/INR No results for input(s): LABPROT, INR in the last 72 hours. ABG No results for input(s): PHART, HCO3 in the last 72 hours.  Invalid input(s): PCO2, PO2  Studies/Results: Dg Chest 1 View  Result Date: 04/21/2016 CLINICAL DATA:  Fever.  Recent abdominal surgery EXAM: CHEST 1 VIEW COMPARISON:  January 30, 2016 FINDINGS: There is no edema or consolidation. Heart is borderline enlarged with pulmonary vascularity within normal limits. No adenopathy. There is evidence of an old healed fracture of the right clavicle with remodeling. There is postoperative change in the lower cervical  spine. IMPRESSION: No edema or consolidation.  Heart borderline enlarged. Electronically Signed   By: Bretta BangWilliam  Woodruff III M.D.   On: 04/21/2016 08:43    Anti-infectives: Anti-infectives    Start     Dose/Rate Route Frequency Ordered Stop   04/17/16 1730  piperacillin-tazobactam (ZOSYN) IVPB 3.375 g     3.375 g 12.5 mL/hr over 240 Minutes Intravenous Every 8 hours 04/17/16 1348     04/17/16 0915  piperacillin-tazobactam (ZOSYN) IVPB 3.375 g     3.375 g 12.5 mL/hr over 240 Minutes Intravenous  Once 04/17/16 0908 04/17/16 1036      Assessment/Plan: s/p Procedure(s): EXPLORATORY LAPAROTOMY COLOSTOMY CREATION/HARTMANN PROCEDURE APPENDECTOMY   Patient doing quite well at this point awaiting bowel function to advance diet.  Lattie Hawichard E Jersi Mcmaster, MD, FACS  04/21/2016

## 2016-04-22 LAB — BASIC METABOLIC PANEL
ANION GAP: 8 (ref 5–15)
BUN: 5 mg/dL — ABNORMAL LOW (ref 6–20)
CALCIUM: 7.5 mg/dL — AB (ref 8.9–10.3)
CO2: 23 mmol/L (ref 22–32)
CREATININE: 0.64 mg/dL (ref 0.44–1.00)
Chloride: 104 mmol/L (ref 101–111)
GFR calc Af Amer: >60 mL/min (ref >60)
GLUCOSE: 73 mg/dL (ref 65–99)
Potassium: 3 mmol/L — ABNORMAL LOW (ref 3.5–5.1)
Sodium: 135 mmol/L (ref 135–145)

## 2016-04-22 LAB — CBC WITH DIFFERENTIAL/PLATELET
Basophils Absolute: 0.1 10*3/uL (ref 0–0.1)
Basophils Relative: 0 %
EOS ABS: 0.3 10*3/uL (ref 0–0.7)
EOS PCT: 2 %
HCT: 27.2 % — ABNORMAL LOW (ref 35.0–47.0)
Hemoglobin: 9.1 g/dL — ABNORMAL LOW (ref 12.0–16.0)
LYMPHS ABS: 1.4 10*3/uL (ref 1.0–3.6)
LYMPHS PCT: 9 %
MCH: 28.6 pg (ref 26.0–34.0)
MCHC: 33.4 g/dL (ref 32.0–36.0)
MCV: 85.4 fL (ref 80.0–100.0)
MONO ABS: 1.1 10*3/uL — AB (ref 0.2–0.9)
MONOS PCT: 8 %
Neutro Abs: 11.7 10*3/uL — ABNORMAL HIGH (ref 1.4–6.5)
Neutrophils Relative %: 81 %
PLATELETS: 316 10*3/uL (ref 150–440)
RBC: 3.18 MIL/uL — AB (ref 3.80–5.20)
RDW: 15.5 % — AB (ref 11.5–14.5)
WBC: 14.5 10*3/uL — AB (ref 3.6–11.0)

## 2016-04-22 LAB — PHOSPHORUS: Phosphorus: 3.7 mg/dL (ref 2.5–4.6)

## 2016-04-22 LAB — MAGNESIUM: Magnesium: 1.5 mg/dL — ABNORMAL LOW (ref 1.7–2.4)

## 2016-04-22 LAB — POTASSIUM: POTASSIUM: 3.1 mmol/L — AB (ref 3.5–5.1)

## 2016-04-22 MED ORDER — MAGNESIUM SULFATE 2 GM/50ML IV SOLN
2.0000 g | Freq: Once | INTRAVENOUS | Status: AC
  Administered 2016-04-22: 2 g via INTRAVENOUS
  Filled 2016-04-22: qty 50

## 2016-04-22 MED ORDER — POTASSIUM CHLORIDE CRYS ER 20 MEQ PO TBCR: 40.0000 meq | EXTENDED_RELEASE_TABLET | Freq: Once | ORAL | Status: DC

## 2016-04-22 MED ORDER — MAGNESIUM SULFATE 2 GM/50ML IV SOLN
2.0000 g | Freq: Once | INTRAVENOUS | Status: DC
  Filled 2016-04-22: qty 50

## 2016-04-22 MED ORDER — OXYCODONE HCL 5 MG PO TABS
5.0000 mg | ORAL_TABLET | ORAL | Status: DC | PRN
  Administered 2016-04-23 – 2016-04-24 (×7): 5 mg via ORAL
  Filled 2016-04-22 (×8): qty 1

## 2016-04-22 MED ORDER — SODIUM CHLORIDE 0.9 % IV SOLN
INTRAVENOUS | Status: DC
  Administered 2016-04-22: 14:00:00 via INTRAVENOUS

## 2016-04-22 MED ORDER — POTASSIUM CHLORIDE 10 MEQ/100ML IV SOLN
10.0000 meq | INTRAVENOUS | Status: AC
  Administered 2016-04-22 (×4): 10 meq via INTRAVENOUS
  Filled 2016-04-22 (×4): qty 100

## 2016-04-22 MED ORDER — POTASSIUM CHLORIDE CRYS ER 20 MEQ PO TBCR
20.0000 meq | EXTENDED_RELEASE_TABLET | ORAL | Status: AC
  Administered 2016-04-22 (×2): 20 meq via ORAL
  Filled 2016-04-22 (×2): qty 1

## 2016-04-22 MED ORDER — OXYCODONE-ACETAMINOPHEN 5-325 MG PO TABS
1.0000 | ORAL_TABLET | ORAL | Status: DC | PRN
  Administered 2016-04-23 – 2016-04-24 (×6): 1 via ORAL
  Filled 2016-04-22 (×6): qty 1

## 2016-04-22 NOTE — Progress Notes (Signed)
MEDICATION RELATED CONSULT NOTE - INITIAL   Pharmacy Consult for electrolyte replacement and monitoring Indication: hypomagnesemia  No Known Allergies  Patient Measurements: Height: 5\' 9"  (175.3 cm) Weight: 212 lb 3.2 oz (96.3 kg) IBW/kg (Calculated) : 66.2 Adjusted Body Weight:   Vital Signs: Temp: 97.5 F (36.4 C) (10/17 0518) Temp Source: Oral (10/17 0518) BP: 109/70 (10/17 0518) Pulse Rate: 94 (10/17 0518) Intake/Output from previous day: 10/16 0701 - 10/17 0700 In: 3133 [P.O.:870; I.V.:2213; IV Piggyback:50] Out: 1645 [Urine:1150; Drains:320; Stool:175] Intake/Output from this shift: No intake/output data recorded.  Labs:  Recent Labs  04/20/16 0405 04/21/16 0422 04/22/16 0453  WBC 12.9*  --  14.5*  HGB 10.1*  --  9.1*  HCT 30.5*  --  27.2*  PLT 295  --  316  CREATININE 0.82 0.78 0.64  MG 1.8  --  1.5*  PHOS 2.9  --  3.7   Estimated Creatinine Clearance: 107.3 mL/min (by C-G formula based on SCr of 0.64 mg/dL).  BMP Latest Ref Rng & Units 04/22/2016 04/21/2016 04/20/2016  Glucose 65 - 99 mg/dL 73 79 65(H)  BUN 6 - 20 mg/dL 5(L) 6 11  Creatinine 8.46 - 1.00 mg/dL 9.62 9.52 8.41  BUN/Creat Ratio 9 - 23 - - -  Sodium 135 - 145 mmol/L 135 133(L) 136  Potassium 3.5 - 5.1 mmol/L 3.0(L) 3.7 3.7  Chloride 101 - 111 mmol/L 104 104 106  CO2 22 - 32 mmol/L 23 25 21(L)  Calcium 8.9 - 10.3 mg/dL 7.5(L) 7.9(L) 7.7(L)      Microbiology: Recent Results (from the past 720 hour(s))  Urine culture     Status: Abnormal   Collection Time: 04/03/16  2:51 PM  Result Value Ref Range Status   Specimen Description URINE, CLEAN CATCH  Final   Special Requests NONE  Final   Culture >=100,000 COLONIES/mL KLEBSIELLA PNEUMONIAE (A)  Final   Report Status 04/05/2016 FINAL  Final   Organism ID, Bacteria KLEBSIELLA PNEUMONIAE (A)  Final      Susceptibility   Klebsiella pneumoniae - MIC*    AMPICILLIN 16 RESISTANT Resistant     CEFAZOLIN <=4 SENSITIVE Sensitive      CEFTRIAXONE <=1 SENSITIVE Sensitive     CIPROFLOXACIN <=0.25 SENSITIVE Sensitive     GENTAMICIN <=1 SENSITIVE Sensitive     IMIPENEM <=0.25 SENSITIVE Sensitive     NITROFURANTOIN <=16 SENSITIVE Sensitive     TRIMETH/SULFA <=20 SENSITIVE Sensitive     AMPICILLIN/SULBACTAM 4 SENSITIVE Sensitive     PIP/TAZO <=4 SENSITIVE Sensitive     Extended ESBL NEGATIVE Sensitive     * >=100,000 COLONIES/mL KLEBSIELLA PNEUMONIAE  MRSA PCR Screening     Status: None   Collection Time: 04/18/16  5:04 AM  Result Value Ref Range Status   MRSA by PCR NEGATIVE NEGATIVE Final    Comment:        The GeneXpert MRSA Assay (FDA approved for NASAL specimens only), is one component of a comprehensive MRSA colonization surveillance program. It is not intended to diagnose MRSA infection nor to guide or monitor treatment for MRSA infections.     Medical History: Past Medical History:  Diagnosis Date  . Anxiety   . Chronic constipation    on Linzess, Miralax, Zofran  . Chronic insomnia 11/02/2015  . COPD, moderate (HCC)   . Depression   . Depression with anxiety    Followed by Dr. Lynett Fish, psychiatrist at [facility] and [facility] for thrapy  . History of  chicken pox   . History of cocaine use   . History of pneumonia   . Hypothyroidism, adult    Working with Dr. Horton ChinShamil [name], Endocrinology. S/P RAIU and is planning on biopsy  . Migraine without status migrainosus, not intractable   . Nicotine dependence, uncomplicated   . Nontoxic multinodular goiter    FNA done 10/30/14 with benign results. Seen [name]. Patient may want a second opinion.  . Rhinitis, allergic   . Vitamin D deficiency     Medications:  Infusions:  . lactated ringers 75 mL/hr at 04/22/16 0117    Assessment: 49 yof cc abdominal pain s/p open sigmoid colectomy with end colostomy, open drainage of pelvic abscess, appendectomy, placement of pelvic drains. Pharmacy consulted to replace  electrolytes.   Goal of Therapy:  K 3.5 to 5 Mg 1.7 to 2.4 Phos 2.5 to 4.6  Plan:  KCl 10 meq iv x 4 and recheck at 1800. Mag sulfate 2 g iv once and f/u AM labs.   Luisa Harthristy, Harmonee Tozer D, Pharm.D Clinical Pharmacist 04/22/2016,7:24 AM

## 2016-04-22 NOTE — Progress Notes (Signed)
MEDICATION RELATED CONSULT NOTE - INITIAL   Pharmacy Consult for electrolyte replacement and monitoring Indication: hypomagnesemia, hypokalemia  No Known Allergies  Patient Measurements: Height: 5\' 9"  (175.3 cm) Weight: 212 lb 3.2 oz (96.3 kg) IBW/kg (Calculated) : 66.2  Vital Signs: Temp: 98.4 F (36.9 C) (10/17 1359) Temp Source: Oral (10/17 1359) BP: 120/81 (10/17 1359) Pulse Rate: 103 (10/17 1359) Intake/Output from previous day: 10/16 0701 - 10/17 0700 In: 3133 [P.O.:870; I.V.:2213; IV Piggyback:50] Out: 1645 [Urine:1150; Drains:320; Stool:175] Intake/Output from this shift: No intake/output data recorded.  Labs:  Recent Labs  04/20/16 0405 04/21/16 0422 04/22/16 0453  WBC 12.9*  --  14.5*  HGB 10.1*  --  9.1*  HCT 30.5*  --  27.2*  PLT 295  --  316  CREATININE 0.82 0.78 0.64  MG 1.8  --  1.5*  PHOS 2.9  --  3.7   Estimated Creatinine Clearance: 107.3 mL/min (by C-G formula based on SCr of 0.64 mg/dL).  BMP Latest Ref Rng & Units 04/22/2016 04/22/2016 04/21/2016  Glucose 65 - 99 mg/dL - 73 79  BUN 6 - 20 mg/dL - 5(L) 6  Creatinine 1.610.44 - 1.00 mg/dL - 0.960.64 0.450.78  BUN/Creat Ratio 9 - 23 - - -  Sodium 135 - 145 mmol/L - 135 133(L)  Potassium 3.5 - 5.1 mmol/L 3.1(L) 3.0(L) 3.7  Chloride 101 - 111 mmol/L - 104 104  CO2 22 - 32 mmol/L - 23 25  Calcium 8.9 - 10.3 mg/dL - 7.5(L) 7.9(L)   Assessment: 49 yof cc abdominal pain s/p open sigmoid colectomy with end colostomy, open drainage of pelvic abscess, appendectomy, placement of pelvic drains. Pharmacy consulted to replace electrolytes.  Goal of Therapy:  K 3.5 to 5 Mg 1.7 to 2.4 Phos 2.5 to 4.6  Plan:  10/17 1800 K = 3.1 despite 40 mEq KCl IV replacement. Magnesium was replaced earlier today.   Will give KCl 20 mEq PO every 2 hours for 2 doses Recheck K and Mg with AM labs tomorrow  Cindi CarbonMary M Dariusz Brase, Pharm.D, BCPS Clinical Pharmacist 04/22/2016,7:23 PM

## 2016-04-22 NOTE — Progress Notes (Signed)
5 Days Post-Op  Subjective: Status post Hartman's procedure and subsequent delayed primary closure of wound. She feels better has no abdominal pain today has been using her incentive spirometer. She's not using her PCA much.  Objective: Vital signs in last 24 hours: Temp:  [97.5 F (36.4 C)-102.1 F (38.9 C)] 97.5 F (36.4 C) (10/17 0518) Pulse Rate:  [93-118] 94 (10/17 0518) Resp:  [17-27] 18 (10/17 0741) BP: (96-146)/(62-92) 109/70 (10/17 0518) SpO2:  [91 %-98 %] 95 % (10/17 0741) Last BM Date: 04/21/16  Intake/Output from previous day: 10/16 0701 - 10/17 0700 In: 3133 [P.O.:870; I.V.:2213; IV Piggyback:50] Out: 1645 [Urine:1150; Drains:320; Stool:175] Intake/Output this shift: Total I/O In: 65 [Other:65] Out: -   Physical exam:  Ostomy output is good at this point vital signs are reviewed. She is no longer febrile but was febrile last night. Chest exam demonstrates much improved lung fields with less rhonchi. Abs are nontender No drainage from wound with Penrose in place no erythema.  Lab Results: CBC   Recent Labs  04/20/16 0405 04/22/16 0453  WBC 12.9* 14.5*  HGB 10.1* 9.1*  HCT 30.5* 27.2*  PLT 295 316   BMET  Recent Labs  04/21/16 0422 04/22/16 0453  NA 133* 135  K 3.7 3.0*  CL 104 104  CO2 25 23  GLUCOSE 79 73  BUN 6 5*  CREATININE 0.78 0.64  CALCIUM 7.9* 7.5*   PT/INR No results for input(s): LABPROT, INR in the last 72 hours. ABG No results for input(s): PHART, HCO3 in the last 72 hours.  Invalid input(s): PCO2, PO2  Studies/Results: Dg Chest 1 View  Result Date: 04/21/2016 CLINICAL DATA:  Fever.  Recent abdominal surgery EXAM: CHEST 1 VIEW COMPARISON:  January 30, 2016 FINDINGS: There is no edema or consolidation. Heart is borderline enlarged with pulmonary vascularity within normal limits. No adenopathy. There is evidence of an old healed fracture of the right clavicle with remodeling. There is postoperative change in the lower cervical  spine. IMPRESSION: No edema or consolidation.  Heart borderline enlarged. Electronically Signed   By: Bretta BangWilliam  Woodruff III M.D.   On: 04/21/2016 08:43    Anti-infectives: Anti-infectives    Start     Dose/Rate Route Frequency Ordered Stop   04/17/16 1730  piperacillin-tazobactam (ZOSYN) IVPB 3.375 g     3.375 g 12.5 mL/hr over 240 Minutes Intravenous Every 8 hours 04/17/16 1348     04/17/16 0915  piperacillin-tazobactam (ZOSYN) IVPB 3.375 g     3.375 g 12.5 mL/hr over 240 Minutes Intravenous  Once 04/17/16 0908 04/17/16 1036      Assessment/Plan: s/p Procedure(s): EXPLORATORY LAPAROTOMY COLOSTOMY CREATION/HARTMANN PROCEDURE APPENDECTOMY   White blood cell count is elevated that at this point her chest exam appears better and she has not been febrile since last night. Her abdomen does not appear to be the source of her fever and I suspected this pulmonary and improved pulmonary toilet with incentive spirometry and ambulation will assist in improving this. We'll advance diet today. Discussed with nursing  Lattie Hawichard E Cooper, MD, FACS  04/22/2016

## 2016-04-22 NOTE — Progress Notes (Signed)
Patient ID: Sabrina Espinoza, female   DOB: 01-08-1966, 50 y.o.   MRN: [id number]   [facility] PROGRESS NOTE  Sabrina Espinoza WUJ:[id number] DOB: 01-08-1966 DOA: 04/17/2016 PCP: Baruch GoutyMelinda Lada, MD  HPI/Subjective: Patient still with abdominal pain. Having quite a lot of output in the colostomy. Diet advanced to full liquid diet  Objective: Vitals:   04/22/16 0850 04/22/16 1141  BP: 114/76   Pulse: 96   Resp: (!) 21 16  Temp: 97.8 F (36.6 C)     Filed Weights   04/17/16 0458 04/18/16 0026 04/20/16 0431  Weight: 82.6 kg (182 lb) 92.1 kg (203 lb 0.7 oz) 96.3 kg (212 lb 3.2 oz)    ROS: Review of Systems  Constitutional: Negative for chills and fever.  Eyes: Negative for blurred vision.  Respiratory: Positive for cough. Negative for shortness of breath.   Cardiovascular: Negative for chest pain.  Gastrointestinal: Positive for abdominal pain. Negative for constipation, diarrhea, nausea and vomiting.  Musculoskeletal: Negative for joint pain.  Neurological: Negative for dizziness and headaches.   Exam: Physical Exam  Constitutional: She is oriented to person, place, and time.  HENT:  Nose: No mucosal edema.  Mouth/Throat: No oropharyngeal exudate or posterior oropharyngeal edema.  Eyes: Conjunctivae, EOM and lids are normal. Pupils are equal, round, and reactive to light.  Neck: No JVD present. Carotid bruit is not present. No edema present. No thyroid mass and no thyromegaly present.  Cardiovascular: S1 normal and S2 normal.  Tachycardia present.  Exam reveals no gallop.   No murmur heard. Pulses:      Dorsalis pedis pulses are 2+ on the right side, and 2+ on the left side.  Respiratory: No respiratory distress. She has decreased breath sounds in the right lower field and the left lower field. She has no wheezes. She has no rhonchi. She has no rales.  GI: Soft. Bowel sounds are normal. She exhibits distension. There is generalized tenderness.  Musculoskeletal:        Right shoulder: She exhibits no swelling.       Right ankle: She exhibits swelling.       Left ankle: She exhibits swelling.  Lymphadenopathy:    She has no cervical adenopathy.  Neurological: She is alert and oriented to person, place, and time. No cranial nerve deficit.  Skin: Skin is warm. No rash noted. Nails show no clubbing.  Psychiatric: She has a normal mood and affect.      Data Reviewed: Basic Metabolic Panel:  Recent Labs Lab 04/18/16 0507 04/19/16 0401 04/20/16 0405 04/21/16 0422 04/22/16 0453  NA 137 136 136 133* 135  K 3.9 3.7 3.7 3.7 3.0*  CL 107 105 106 104 104  CO2 24 26 21* 25 23  GLUCOSE 96 77 53* 79 73  BUN 12 14 11 6  5*  CREATININE 0.98 0.82 0.82 0.78 0.64  CALCIUM 7.5* 7.4* 7.7* 7.9* 7.5*  MG 1.1* 1.9 1.8  --  1.5*  PHOS 4.1 2.6 2.9  --  3.7   Liver Function Tests:  Recent Labs Lab 04/17/16 0518  AST 28  ALT 18  ALKPHOS 79  BILITOT 0.4  PROT 6.9  ALBUMIN 3.7    Recent Labs Lab 04/17/16 0518  LIPASE 15   CBC:  Recent Labs Lab 04/17/16 0518 04/17/16 2007 04/18/16 0507 04/19/16 0401 04/20/16 0405 04/22/16 0453  WBC 12.1* 17.1* 16.6* 14.4* 12.9* 14.5*  NEUTROABS 9.9*  --   --   --   --  11.7*  HGB 13.6 12.7 11.2* 10.3* 10.1* 9.1*  HCT 40.6 37.4 33.6* 31.0* 30.5* 27.2*  MCV 85.0 84.0 86.2 86.2 85.8 85.4  PLT 429 330 306 253 295 316   Cardiac Enzymes:  Recent Labs Lab 04/17/16 0518  TROPONINI <0.03   CBG:  Recent Labs Lab 04/18/16 0045  GLUCAP 106*    Recent Results (from the past 240 hour(s))  MRSA PCR Screening     Status: None   Collection Time: 04/18/16  5:04 AM  Result Value Ref Range Status   MRSA by PCR NEGATIVE NEGATIVE Final    Comment:        The GeneXpert MRSA Assay (FDA approved for NASAL specimens only), is one component of a comprehensive MRSA colonization surveillance program. It is not intended to diagnose MRSA infection nor to guide or monitor treatment for MRSA infections.       Scheduled Meds: . clonazePAM  0.5 mg Oral TID  . diltiazem  180 mg Oral Daily  . enoxaparin (LOVENOX) injection  40 mg Subcutaneous Q24H  . famotidine (PEPCID) IV  20 mg Intravenous Q12H  . gabapentin  400 mg Oral TID  . hydrOXYzine  50 mg Oral BID  . ketorolac  30 mg Intravenous Q6H  . lamoTRIgine  150 mg Oral BID  . loratadine  10 mg Oral QPM  . mometasone-formoterol  2 puff Inhalation BID  . montelukast  10 mg Oral QHS  . morphine   Intravenous Q4H  . nicotine  21 mg Transdermal Daily  . nortriptyline  100 mg Oral QHS  . piperacillin-tazobactam (ZOSYN)  IV  3.375 g Intravenous Q8H  . potassium chloride  10 mEq Intravenous Q1 Hr x 4  . rOPINIRole  2 mg Oral QHS   Continuous Infusions: . lactated ringers 10 mL/hr at 04/22/16 1610    Assessment/Plan:  1. COPD.  Respiratory status stable.  Continue inhaler and Xopenex treatments.  Last chest x-ray negative. 2. Perforated sigmoid diverticulitis requiring surgical intervention.  Patient started on  l full iquid diet. Still on PCA pump for pain management.  Patient on IV Zosyn.  An oral pain medications 3. Hypomagnesemia and hypokalemia. Replace again IV today  4. Anxiety and depression.   Continue oral psychiatric medications. 5. Tobacco abuse. Nicotine patch. 6. Physical therapy evaluation 7. Sinus tachycardia at rest and even higher with ambulation. Cardizem CD started   Code Status:     Code Status Orders        Start     Ordered   04/17/16 1349  Full code  Continuous     04/17/16 1348    Code Status History    Date Active Date Inactive Code Status Order ID Comments User Context   This patient has a current code status but no historical code status.     Disposition Plan: as per surgeon  Consultants:  Gen. surgery  Procedures:  Open sigmoid colectomy with colostomy  Antibiotics:  Zosyn  Time spent: 25 minutes. Case discussed With general surgery today.   Alford Highland  [employer]

## 2016-04-22 NOTE — Consult Note (Signed)
WOC Nurse ostomy follow up Stoma type/location: LLQ Colostomy Stomal assessment/size: Less edematous  Peristomal assessment: N/A Treatment options for stomal/peristomal skin: N/A Output Light brown loose stool/ thicker today Ostomy pouching: 2pc. 2 3/4" Education provided: Talked to patient about ostomy bag change frequency, to be changed tomorrow. Discussed output from ostomy and requested a family member to be present tomorrow for bag change.  [place] [facility] [place] Dis[place]: Yes WOC team will follow. Maple HudsonKaren Camillo Quadros RN BSN CWON Pager [phone]

## 2016-04-22 NOTE — Progress Notes (Signed)
Physical Therapy Treatment Patient Details Name: Sabrina Espinoza MRN: [id number] DOB: 12/19/1965 Today's Date: 04/22/2016    History of Present Illness Sabrina Espinoza is a 49yo white female who comes [facility] on 10/[facility] with ABD pain, and later that day required colectomy, cyst drainage, apendectomy, and placement of 2 pelvic drainage tubes. At baseline, pt is fully independent, lives alone, without mobility restrictions or limitations. PMH: COPD.     PT Comments    Pt agreeable to PT with encouragement. Pt complains of pain in abdomen that is 9/10 at rest (states 20/10 with rolling/up in bed). Pt refuses out of bed at this time, but agrees to bed exercises. Pt participates well requiring assist for movement exercises due to pain; encouraged abdominal bracing with movement exercises. Continue PT to progress endurance and strength to improve functional mobility to improve functional mobility and support discharge plan of home with home health or outpatient PT.   Follow Up Recommendations  Home health PT;Outpatient PT;Supervision - Intermittent (pending progress with out of bed/ambulation)     Equipment Recommendations       Recommendations for Other Services       Precautions / Restrictions Precautions Precautions: Fall Restrictions Weight Bearing Restrictions: No    Mobility  Bed Mobility               General bed mobility comments: Not tested; refused out of bed at this time.   Transfers                    Ambulation/Gait                 Stairs            Wheelchair Mobility    Modified Rankin (Stroke Patients Only)       Balance                                    Cognition Arousal/Alertness: Lethargic Behavior During Therapy: WFL for tasks assessed/performed Overall Cognitive Status: Within Functional Limits for tasks assessed                      Exercises General Exercises - Lower Extremity Ankle  Circles/Pumps: AROM;Both;20 reps;Supine Quad Sets: Strengthening;Both;20 reps;Supine Gluteal Sets: Strengthening;Both;20 reps;Supine Short Arc Quad: AROM;Both;20 reps;Supine Heel Slides: AAROM;Both;20 reps;Supine Hip ABduction/ADduction: AAROM;Both;20 reps;Supine    General Comments        Pertinent Vitals/Pain Pain Assessment: 0-10 Pain Score: 9  Pain Location: Abdomen Pain Intervention(s): Limited activity within patient's tolerance;Monitored during session;Premedicated before session    Home Living                      Prior Function            PT Goals (current goals can now be found in the care plan section)      Frequency    Min 2X/week      PT Plan Current plan remains appropriate (pt will need to demonstrate out of bed activity )    Co-evaluation             End of Session Equipment Utilized During Treatment: Oxygen Activity Tolerance: Patient limited by fatigue;Patient limited by lethargy;Patient limited by pain Patient left: in bed;with call bell/phone within reach;with bed alarm set;with family/visitor present     Time: 4098-1191 PT Time Calculation (min) (ACUTE ONLY): 19  min  Charges:  $Therapeutic Exercise: 8-22 mins                    G Codes:      Scot DockHeidi E Earle Burson, PTA 04/22/2016, 1:02 PM

## 2016-04-22 NOTE — Progress Notes (Signed)
Chaplain made a follow-up with this Pt, who he had visited before and she was depressed because of health changes. But on this visit, Pt looked much better and was making great improvement. Pt asked Ch to keep prayer for her and also keep on praying for her to get better. Ch offered prayers for Pt and her mother who was there at the time. Chaplain suggests another follow-up with the Pt in the coming days.    04/22/16 1700  Clinical Encounter Type  Visited With Patient  Visit Type Follow-up;Spiritual support  Spiritual Encounters  Spiritual Needs Prayer

## 2016-04-23 LAB — BASIC METABOLIC PANEL
Anion gap: 10 (ref 5–15)
CALCIUM: 7.6 mg/dL — AB (ref 8.9–10.3)
CO2: 22 mmol/L (ref 22–32)
CREATININE: 0.61 mg/dL (ref 0.44–1.00)
Chloride: 103 mmol/L (ref 101–111)
GFR calc Af Amer: >60 mL/min (ref >60)
GLUCOSE: 69 mg/dL (ref 65–99)
POTASSIUM: 3.4 mmol/L — AB (ref 3.5–5.1)
SODIUM: 135 mmol/L (ref 135–145)

## 2016-04-23 LAB — MAGNESIUM: MAGNESIUM: 1.6 mg/dL — AB (ref 1.7–2.4)

## 2016-04-23 MED ORDER — MAGNESIUM SULFATE 2 GM/50ML IV SOLN
2.0000 g | Freq: Once | INTRAVENOUS | Status: AC
  Administered 2016-04-23: 2 g via INTRAVENOUS
  Filled 2016-04-23: qty 50

## 2016-04-23 MED ORDER — POTASSIUM CHLORIDE CRYS ER 20 MEQ PO TBCR
40.0000 meq | EXTENDED_RELEASE_TABLET | Freq: Once | ORAL | Status: AC
  Administered 2016-04-23: 40 meq via ORAL
  Filled 2016-04-23: qty 2

## 2016-04-23 NOTE — Progress Notes (Signed)
MEDICATION RELATED CONSULT NOTE - INITIAL   Pharmacy Consult for electrolyte replacement and monitoring Indication: hypomagnesemia, hypokalemia  No Known Allergies  Patient Measurements: Height: 5\' 9"  (175.3 cm) Weight: 212 lb 3.2 oz (96.3 kg) IBW/kg (Calculated) : 66.2  Vital Signs: Temp: 99.6 F (37.6 C) (10/18 0522) Temp Source: Oral (10/18 0522) BP: 129/86 (10/18 0824) Pulse Rate: 105 (10/18 0824) Intake/Output from previous day: 10/17 0701 - 10/18 0700 In: 1061 [P.O.:180; I.V.:366; IV Piggyback:450] Out: 2788 [Urine:2050; Drains:213; Stool:525] Intake/Output from this shift: Total I/O In: -  Out: 460 [Urine:400; Stool:60]  Labs:  Recent Labs  04/21/16 0422 04/22/16 0453 04/23/16 0505  WBC  --  14.5*  --   HGB  --  9.1*  --   HCT  --  27.2*  --   PLT  --  316  --   CREATININE 0.78 0.64 0.61  MG  --  1.5* 1.6*  PHOS  --  3.7  --    Estimated Creatinine Clearance: 107.3 mL/min (by C-G formula based on SCr of 0.61 mg/dL).  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 Pharmacy consulted to replace electrolytes.  Goal of Therapy:  K 3.5 to 5 Mg 1.7 to 2.4 Phos 2.5 to 4.6  Plan:  10/18 K = 3.4, Magnesium= 1.6   Patient has already received Mag Sulfate 2g IV today.  Will give KCl 40mEq PO once.  Clovia CuffLisa Phala Schraeder, PharmD, BCPS 04/23/2016 10:55 AM

## 2016-04-23 NOTE — Care Management Important Message (Signed)
Important Message  Patient Details  Name: Sabrina Espinoza MRN: [id number] Date of Birth: August 06, 1965   Medicare Important Message Given:  Yes    Chapman FitchBOWEN, Desmin Daleo T, RN 04/23/2016, 2:01 PM

## 2016-04-23 NOTE — Progress Notes (Signed)
6 Days Post-Op  Subjective: Patient status post Hartman's procedure. She has good ostomy output has not been up walking much but is feeling better today. She is tolerating full liquid diet.  Objective: Vital signs in last 24 hours: Temp:  [97.8 F (36.6 C)-99.6 F (37.6 C)] 99.6 F (37.6 C) (10/18 0522) Pulse Rate:  [96-110] 110 (10/18 0522) Resp:  [16-28] 18 (10/18 0522) BP: (99-131)/(62-82) 131/82 (10/18 0522) SpO2:  [95 %-99 %] 96 % (10/18 0522) FiO2 (%):  [97 %] 97 % (10/18 0358) Last BM Date: 04/22/16 (colostomy)  Intake/Output from previous day: 10/17 0701 - 10/18 0700 In: 1061 [P.O.:180; I.V.:366; IV Piggyback:450] Out: 2788 [Urine:2050; Drains:213; Stool:525] Intake/Output this shift: No intake/output data recorded.  Physical exam:  Patient is afebrile vital signs are reviewed.  Abdomen is soft and nontender wound is clean with minimal drainage no purulence no erythema good ostomy output.  CTA RR no rales or rhonchi today. Her  calves are nontender  Lab Results: CBC   Recent Labs  04/22/16 0453  WBC 14.5*  HGB 9.1*  HCT 27.2*  PLT 316   BMET  Recent Labs  04/22/16 0453 04/22/16 1814 04/23/16 0505  NA 135  --  135  K 3.0* 3.1* 3.4*  CL 104  --  103  CO2 23  --  22  GLUCOSE 73  --  69  BUN 5*  --  <5*  CREATININE 0.64  --  0.61  CALCIUM 7.5*  --  7.6*   PT/INR No results for input(s): LABPROT, INR in the last 72 hours. ABG No results for input(s): PHART, HCO3 in the last 72 hours.  Invalid input(s): PCO2, PO2  Studies/Results: Dg Chest 1 View  Result Date: 04/21/2016 CLINICAL DATA:  Fever.  Recent abdominal surgery EXAM: CHEST 1 VIEW COMPARISON:  January 30, 2016 FINDINGS: There is no edema or consolidation. Heart is borderline enlarged with pulmonary vascularity within normal limits. No adenopathy. There is evidence of an old healed fracture of the right clavicle with remodeling. There is postoperative change in the lower cervical spine.  IMPRESSION: No edema or consolidation.  Heart borderline enlarged. Electronically Signed   By: Bretta BangWilliam  Woodruff III M.D.   On: 04/21/2016 08:43    Anti-infectives: Anti-infectives    Start     Dose/Rate Route Frequency Ordered Stop   04/17/16 1730  piperacillin-tazobactam (ZOSYN) IVPB 3.375 g     3.375 g 12.5 mL/hr over 240 Minutes Intravenous Every 8 hours 04/17/16 1348     04/17/16 0915  piperacillin-tazobactam (ZOSYN) IVPB 3.375 g     3.375 g 12.5 mL/hr over 240 Minutes Intravenous  Once 04/17/16 0908 04/17/16 1036      Assessment/Plan: s/p Procedure(s): EXPLORATORY LAPAROTOMY COLOSTOMY CREATION/HARTMANN PROCEDURE APPENDECTOMY   Slow improvement. We'll saline lock DC PCA and advance diet. She is on oral analgesics now not requiring a PCA. I will also attempt to ambulate her a little more today in anticipation of her being discharged next day or 2. Continue IV antibiotics for now  Lattie Hawichard E Willetta York, MD, FACS  04/23/2016

## 2016-04-23 NOTE — Progress Notes (Signed)
Patient ID: Sabrina Espinoza, female   DOB: Nov 07, 1965, 50 y.o.   MRN: [id number]   [facility] PROGRESS NOTE  Sabrina Rasudrey E Moorehead WUJ:[id number] DOB: Nov 07, 1965 DOA: 04/17/2016 PCP: Baruch GoutyMelinda Lada, MD  HPI/Subjective: Patient complains of some abdominal pain. Denies nausea, vomiting. Has liquidy stool via stoma. Crying, uncomfortable. Emotional support was provided.  Objective: Vitals:   04/23/16 0824 04/23/16 1209  BP: 129/86 130/84  Pulse: (!) 105 (!) 104  Resp: (!) 28 (!) 22  Temp:  99.2 F (37.3 C)    Filed Weights   04/17/16 0458 04/18/16 0026 04/20/16 0431  Weight: 82.6 kg (182 lb) 92.1 kg (203 lb 0.7 oz) 96.3 kg (212 lb 3.2 oz)    ROS: Review of Systems  Constitutional: Negative for chills and fever.  Eyes: Negative for blurred vision.  Respiratory: Positive for cough. Negative for shortness of breath.   Cardiovascular: Negative for chest pain.  Gastrointestinal: Positive for abdominal pain. Negative for constipation, diarrhea, nausea and vomiting.  Musculoskeletal: Negative for joint pain.  Neurological: Negative for dizziness and headaches.   Exam: Physical Exam  Constitutional: She is oriented to person, place, and time.  HENT:  Nose: No mucosal edema.  Mouth/Throat: No oropharyngeal exudate or posterior oropharyngeal edema.  Eyes: Conjunctivae, EOM and lids are normal. Pupils are equal, round, and reactive to light.  Neck: No JVD present. Carotid bruit is not present. No edema present. No thyroid mass and no thyromegaly present.  Cardiovascular: S1 normal and S2 normal.  Tachycardia present.  Exam reveals no gallop.   No murmur heard. Pulses:      Dorsalis pedis pulses are 2+ on the right side, and 2+ on the left side.  Respiratory: No respiratory distress. She has decreased breath sounds in the right lower field and the left lower field. She has no wheezes. She has no rhonchi. She has no rales.  GI: Soft. Bowel sounds are normal. She exhibits distension.  There is generalized tenderness.  Musculoskeletal:       Right shoulder: She exhibits no swelling.       Right ankle: She exhibits swelling.       Left ankle: She exhibits swelling.  Lymphadenopathy:    She has no cervical adenopathy.  Neurological: She is alert and oriented to person, place, and time. No cranial nerve deficit.  Skin: Skin is warm. No rash noted. Nails show no clubbing.  Psychiatric: She has a normal mood and affect.      Data Reviewed: Basic Metabolic Panel:  Recent Labs Lab 04/18/16 0507 04/19/16 0401 04/20/16 0405 04/21/16 0422 04/22/16 0453 04/22/16 1814 04/23/16 0505  NA 137 136 136 133* 135  --  135  K 3.9 3.7 3.7 3.7 3.0* 3.1* 3.4*  CL 107 105 106 104 104  --  103  CO2 24 26 21* 25 23  --  22  GLUCOSE 96 77 53* 79 73  --  69  BUN 12 14 11 6  5*  --  <5*  CREATININE 0.98 0.82 0.82 0.78 0.64  --  0.61  CALCIUM 7.5* 7.4* 7.7* 7.9* 7.5*  --  7.6*  MG 1.1* 1.9 1.8  --  1.5*  --  1.6*  PHOS 4.1 2.6 2.9  --  3.7  --   --    Liver Function Tests:  Recent Labs Lab 04/17/16 0518  AST 28  ALT 18  ALKPHOS 79  BILITOT 0.4  PROT 6.9  ALBUMIN 3.7    Recent Labs Lab 04/17/16 0518  LIPASE 15   CBC:  Recent Labs Lab 04/17/16 0518 04/17/16 2007 04/18/16 0507 04/19/16 0401 04/20/16 0405 04/22/16 0453  WBC 12.1* 17.1* 16.6* 14.4* 12.9* 14.5*  NEUTROABS 9.9*  --   --   --   --  11.7*  HGB 13.6 12.7 11.2* 10.3* 10.1* 9.1*  HCT 40.6 37.4 33.6* 31.0* 30.5* 27.2*  MCV 85.0 84.0 86.2 86.2 85.8 85.4  PLT 429 330 306 253 295 316   Cardiac Enzymes:  Recent Labs Lab 04/17/16 0518  TROPONINI <0.03   CBG:  Recent Labs Lab 04/18/16 0045  GLUCAP 106*    Recent Results (from the past 240 hour(s))  MRSA PCR Screening     Status: None   Collection Time: 04/18/16  5:04 AM  Result Value Ref Range Status   MRSA by PCR NEGATIVE NEGATIVE Final    Comment:        The GeneXpert MRSA Assay (FDA approved for NASAL specimens only), is one  component of a comprehensive MRSA colonization surveillance program. It is not intended to diagnose MRSA infection nor to guide or monitor treatment for MRSA infections.      Scheduled Meds: . clonazePAM  0.5 mg Oral TID  . diltiazem  180 mg Oral Daily  . enoxaparin (LOVENOX) injection  40 mg Subcutaneous Q24H  . gabapentin  400 mg Oral TID  . hydrOXYzine  50 mg Oral BID  . lamoTRIgine  150 mg Oral BID  . loratadine  10 mg Oral QPM  . mometasone-formoterol  2 puff Inhalation BID  . montelukast  10 mg Oral QHS  . nicotine  21 mg Transdermal Daily  . nortriptyline  100 mg Oral QHS  . piperacillin-tazobactam (ZOSYN)  IV  3.375 g Intravenous Q8H  . rOPINIRole  2 mg Oral QHS   Continuous Infusions: . sodium chloride 10 mL/hr at 04/22/16 1426    Assessment/Plan:  1. COPD.  Respiratory status stable.  Continue inhaler and Xopenex treatments.  Last chest x-ray negative. 2. Perforated sigmoid diverticulitis requiring surgical intervention.  Patient started on  l full iquid diet.Now off  PCA pump, commended ambulation.  Patient on IV Zosyn.  An oral pain medications 3. Hypomagnesemia and hypokalemia. Replacing again IV today  4. Anxiety and depression.   Continue oral psychiatric medications, emotional support was provided. 5. Tobacco abuse. Nicotine patch. 6. Physical therapy evaluation, home health PT versus outpatient PT was recommended, depending on progress 7. Sinus tachycardia at rest and even higher with ambulation. Cardizem CD to be continued, stable, likely due to abdominal distention  Code Status:     Code Status Orders        Start     Ordered   04/17/16 1349  Full code  Continuous     04/17/16 1348    Code Status History    Date Active Date Inactive Code Status Order ID Comments User Context   This patient has a current code status but no historical code status.     Disposition Plan: as per surgeon  Consultants:  Gen. surgery  Procedures:  Open sigmoid  colectomy with colostomy  Antibiotics:  Zosyn  Time spent: 30 minutes. [facility]  [facility]

## 2016-04-24 LAB — POTASSIUM: Potassium: 3.2 mmol/L — ABNORMAL LOW (ref 3.5–5.1)

## 2016-04-24 LAB — MAGNESIUM: MAGNESIUM: 1.6 mg/dL — AB (ref 1.7–2.4)

## 2016-04-24 MED ORDER — POTASSIUM CHLORIDE 10 MEQ/100ML IV SOLN
10.0000 meq | INTRAVENOUS | Status: AC
Start: 2016-04-24 — End: 2016-04-24
  Administered 2016-04-24 (×3): 10 meq via INTRAVENOUS
  Filled 2016-04-24 (×4): qty 100

## 2016-04-24 MED ORDER — MAGNESIUM SULFATE 4 GM/100ML IV SOLN
4.0000 g | Freq: Two times a day (BID) | INTRAVENOUS | Status: AC
  Administered 2016-04-24 – 2016-04-25 (×3): 4 g via INTRAVENOUS
  Filled 2016-04-24 (×4): qty 100

## 2016-04-24 NOTE — Plan of Care (Signed)
Problem: Safety: Goal: Ability to remain free from injury will improve Outcome: Progressing Patient bed alarm on

## 2016-04-24 NOTE — Progress Notes (Signed)
Physical Therapy Treatment Patient Details Name: ASHAWNA HANBACK MRN: [id number] DOB: Jul 17, 1965 Today's Date: 04/24/2016    History of Present Illness Katee Wentland is a 50yo white female who comes to [facility] on 10/12 with ABD pain, and later that day required colectomy, cyst drainage, apendectomy, and placement of 2 pelvic drainage tubes. At baseline, pt is fully independent, lives alone, without mobility restrictions or limitations. PMH: COPD.     PT Comments    Pt demonstrates independence with bed mobility and transfers. She is able to ambulate 2 full laps around RN station with supervision only. Pt visibly fatigued during ambulation and SaO2 drops to 88% but recovers with extended rest break. HR increases to 130 bpm. Pt with very flat affect and minimal communication but agreeable to PT. She does not need SNF placement for physical therapy needs but would benefit from [facility] PT vs OP PT pending homebound status. Pt will benefit from skilled PT services to address deficits in strength, balance, and mobility in order to return to full function at home.   Follow Up Recommendations  Home health PT;Other (comment) (If not homebound arrange outpatient PT)     Equipment Recommendations  None recommended by PT    Recommendations for Other Services       Precautions / Restrictions Precautions Precautions: Fall Restrictions Weight Bearing Restrictions: No    Mobility  Bed Mobility Overal bed mobility: Independent             General bed mobility comments: No bed rails utilized. HOB mildly elevated  Transfers Overall transfer level: Independent               General transfer comment: Pt demonstrates good speed/sequencing with transfers  Ambulation/Gait Ambulation/Gait assistance: Supervision Ambulation Distance (Feet): 350 Feet Assistive device: None Gait Pattern/deviations: WFL(Within Functional Limits) Gait velocity: Decreased but functional for limited household  mobility   General Gait Details: Pt ambulates 2 full laps around RN station. Performs horizontal and vertical head turns with mild lateral gait deviations. Pt with DOE and visibly fatigued. Provided one standing rest break. SaO2 drops to 88% on room air and takes 30-45 seconds of standing rest break with pursed lip breathing for SaO2 to recover to 90%. Encouraged pt to perform gait speed changes but she states she cannot move any faster   Careers information officer    Modified Rankin (Stroke Patients Only)       Balance Overall balance assessment: No apparent balance deficits (not formally assessed)                                  Cognition Arousal/Alertness: Lethargic Behavior During Therapy: WFL for tasks assessed/performed Overall Cognitive Status: Within Functional Limits for tasks assessed                      Exercises      General Comments        Pertinent Vitals/Pain Pain Assessment: 0-10 Pain Location: Unable to rate. Pt just reports "I hurt a lot all over." Pain Intervention(s): Monitored during session    Home Living                      Prior Function            PT Goals (current goals can now be found in the care  plan section) Acute Rehab PT Goals Patient Stated Goal: Improve AMB and independence PT Goal Formulation: With patient Time For Goal Achievement: 05/04/16 Potential to Achieve Goals: Good Progress towards PT goals: Progressing toward goals    Frequency    Min 2X/week      PT Plan Current plan remains appropriate    Co-evaluation             End of Session Equipment Utilized During Treatment: Gait belt Activity Tolerance: Patient tolerated treatment well Patient left: in bed;with call bell/phone within reach;with bed alarm set;with family/visitor present     Time: [id number] PT Time Calculation (min) (ACUTE ONLY): 10 min  Charges:  $Gait Training: 8-22 mins                     G Codes:      Shivon Hackel 04/24/2016, 5:11 PM

## 2016-04-24 NOTE — Progress Notes (Signed)
MEDICATION RELATED CONSULT NOTE - INITIAL   Pharmacy Consult for electrolyte replacement and monitoring Indication: hypomagnesemia, hypokalemia  No Known Allergies  Patient Measurements: Height: 5\' 9"  (175.3 cm) Weight: 212 lb 3.2 oz (96.3 kg) IBW/kg (Calculated) : 66.2  Vital Signs: Temp: 99.3 F (37.4 C) (10/19 0615) Temp Source: Oral (10/19 0615) BP: 123/82 (10/19 0435) Pulse Rate: 105 (10/19 0435) Intake/Output from previous day: 10/18 0701 - 10/19 0700 In: 1060 [P.O.:640; I.V.:270; IV Piggyback:150] Out: 2880 [Urine:1900; Drains:300; Stool:680] Intake/Output from this shift: Total I/O In: -  Out: 300 [Urine:300]  Labs:  Recent Labs  04/22/16 0453 04/23/16 0505 04/24/16 0516  WBC 14.5*  --   --   HGB 9.1*  --   --   HCT 27.2*  --   --   PLT 316  --   --   CREATININE 0.64 0.61  --   MG 1.5* 1.6* 1.6*  PHOS 3.7  --   --    Estimated Creatinine Clearance: 107.3 mL/min (by C-G formula based on SCr of 0.61 mg/dL).  BMP Latest Ref Rng & Units 04/24/2016 04/23/2016 04/22/2016  Glucose 65 - 99 mg/dL - 69 -  BUN 6 - 20 mg/dL - <1(O<5(L) -  Creatinine 1.090.44 - 1.00 mg/dL - 6.040.61 -  BUN/Creat Ratio 9 - 23 - - -  Sodium 135 - 145 mmol/L - 135 -  Potassium 3.5 - 5.1 mmol/L 3.2(L) 3.4(L) 3.1(L)  Chloride 101 - 111 mmol/L - 103 -  CO2 22 - 32 mmol/L - 22 -  Calcium 8.9 - 10.3 mg/dL - 7.6(L) -   Assessment: 49 yof cc abdominal pain s/p open sigmoid colectomy with end colostomy, open drainage of pelvic abscess, appendectomy, placement of pelvic drains. Pharmacy consulted to replace electrolytes.  Goal of Therapy:  K 3.5 to 5 Mg 1.7 to 2.4 Phos 2.5 to 4.6  Plan:  10/18 K = 3.2, Magnesium= 1.6   Patient has orders for Mag Sulfate 4g IV BID and KCl 10mEq IV x 4 today.   Sabrina Espinoza, PharmD, BCPS 04/24/2016 9:58 AM

## 2016-04-24 NOTE — Progress Notes (Signed)
Discussed patient's care with the patient and with her mother. We discussed disposition. Patient lives alone and seems unable to understand or actively participate in some of her care. This is especially problematic with a new colostomy. I'm in agreement with the patient's mother that rehabilitation facility would be helpful and I will ask for social services consult and once patient is afebrile we can consider discharge

## 2016-04-24 NOTE — Progress Notes (Signed)
Patient ID: Sabrina Espinoza, female   DOB: 1965/12/25, 50 y.o.   MRN: [id number]   [facility] PROGRESS NOTE  MAYE PARKINSON WUJ:[id number] DOB: April 27, 1966 DOA: 04/17/2016 PCP: Domenic Schwab, FNP  HPI/Subjective: Patient complains of some abdominal pain. Denies nausea, vomiting. Has liquidy stool via stoma. Complains of pain.  fever to 100.4 yesterday in the evening. Was seen by surgery, recommended to continue antibiotic therapy until afebrile for at least at 24 hours. Patient denies any shortness of breath. Magnesium and potassium levels are low today again, despite supplementation     Objective: Vitals:   04/24/16 0615 04/24/16 1248  BP:  131/76  Pulse:  (!) 101  Resp:  20  Temp: 99.3 F (37.4 C) 98.8 F (37.1 C)    Filed Weights   04/17/16 0458 04/18/16 0026 04/20/16 0431  Weight: 82.6 kg (182 lb) 92.1 kg (203 lb 0.7 oz) 96.3 kg (212 lb 3.2 oz)    ROS: Review of Systems  Constitutional: Negative for chills and fever.  Eyes: Negative for blurred vision.  Respiratory: Positive for cough. Negative for shortness of breath.   Cardiovascular: Negative for chest pain.  Gastrointestinal: Positive for abdominal pain. Negative for constipation, diarrhea, nausea and vomiting.  Musculoskeletal: Negative for joint pain.  Neurological: Negative for dizziness and headaches.   Exam: Physical Exam  Constitutional: She is oriented to person, place, and time.  HENT:  Nose: No mucosal edema.  Mouth/Throat: No oropharyngeal exudate or posterior oropharyngeal edema.  Eyes: Conjunctivae, EOM and lids are normal. Pupils are equal, round, and reactive to light.  Neck: No JVD present. Carotid bruit is not present. No edema present. No thyroid mass and no thyromegaly present.  Cardiovascular: S1 normal and S2 normal.  Tachycardia present.  Exam reveals no gallop.   No murmur heard. Pulses:      Dorsalis pedis pulses are 2+ on the right side, and 2+ on the left side.   Respiratory: No respiratory distress. She has decreased breath sounds in the right lower field and the left lower field. She has no wheezes. She has no rhonchi. She has no rales.  GI: Soft. Bowel sounds are normal. She exhibits distension. There is generalized tenderness.  Musculoskeletal:       Right shoulder: She exhibits no swelling.       Right ankle: She exhibits swelling.       Left ankle: She exhibits swelling.  Lymphadenopathy:    She has no cervical adenopathy.  Neurological: She is alert and oriented to person, place, and time. No cranial nerve deficit.  Skin: Skin is warm. No rash noted. Nails show no clubbing.  Psychiatric: She has a normal mood and affect.      Data Reviewed: Basic Metabolic Panel:  Recent Labs Lab 04/18/16 0507 04/19/16 0401 04/20/16 0405 04/21/16 0422 04/22/16 0453 04/22/16 1814 04/23/16 0505 04/24/16 0516  NA 137 136 136 133* 135  --  135  --   K 3.9 3.7 3.7 3.7 3.0* 3.1* 3.4* 3.2*  CL 107 105 106 104 104  --  103  --   CO2 24 26 21* 25 23  --  22  --   GLUCOSE 96 77 53* 79 73  --  69  --   BUN 12 14 11 6  5*  --  <5*  --   CREATININE 0.98 0.82 0.82 0.78 0.64  --  0.61  --   CALCIUM 7.5* 7.4* 7.7* 7.9* 7.5*  --  7.6*  --  MG 1.1* 1.9 1.8  --  1.5*  --  1.6* 1.6*  PHOS 4.1 2.6 2.9  --  3.7  --   --   --    Liver Function Tests: No results for input(s): AST, ALT, ALKPHOS, BILITOT, PROT, ALBUMIN in the last 168 hours. No results for input(s): LIPASE, AMYLASE in the last 168 hours. CBC:  Recent Labs Lab 04/17/16 2007 04/18/16 0507 04/19/16 0401 04/20/16 0405 04/22/16 0453  WBC 17.1* 16.6* 14.4* 12.9* 14.5*  NEUTROABS  --   --   --   --  11.7*  HGB 12.7 11.2* 10.3* 10.1* 9.1*  HCT 37.4 33.6* 31.0* 30.5* 27.2*  MCV 84.0 86.2 86.2 85.8 85.4  PLT 330 306 253 295 316   Cardiac Enzymes: No results for input(s): CKTOTAL, CKMB, CKMBINDEX, TROPONINI in the last 168 hours. CBG:  Recent Labs Lab 04/18/16 0045  GLUCAP 106*     Recent Results (from the past 240 hour(s))  MRSA PCR Screening     Status: None   Collection Time: 04/18/16  5:04 AM  Result Value Ref Range Status   MRSA by PCR NEGATIVE NEGATIVE Final    Comment:        The GeneXpert MRSA Assay (FDA approved for NASAL specimens only), is one component of a comprehensive MRSA colonization surveillance program. It is not intended to diagnose MRSA infection nor to guide or monitor treatment for MRSA infections.      Scheduled Meds: . clonazePAM  0.5 mg Oral TID  . diltiazem  180 mg Oral Daily  . enoxaparin (LOVENOX) injection  40 mg Subcutaneous Q24H  . gabapentin  400 mg Oral TID  . hydrOXYzine  50 mg Oral BID  . lamoTRIgine  150 mg Oral BID  . loratadine  10 mg Oral QPM  . magnesium sulfate 1 - 4 g bolus IVPB  4 g Intravenous BID AC  . mometasone-formoterol  2 puff Inhalation BID  . montelukast  10 mg Oral QHS  . nicotine  21 mg Transdermal Daily  . nortriptyline  100 mg Oral QHS  . piperacillin-tazobactam (ZOSYN)  IV  3.375 g Intravenous Q8H  . rOPINIRole  2 mg Oral QHS   Continuous Infusions:    Assessment/Plan:  1. COPD.  Respiratory status stable, Remains on room air, oxygenation is good.  Continue inhaler and Xopenex treatments.  Last chest x-ray was negative. 2. Perforated sigmoid diverticulitis requiring surgical intervention.  Patientis being continued on a regular diet.Now off  PCA pump. The pain control is satisfactory, ambulation was recommended.  Patientis being continued on IV Zosyn due to ongoing low-grade fever . Surgery recommends CT of abdomen and pelvis if fevers do not subside .  3. Hypomagnesemia and hypokalemia. Replacing again IV today , Recheck in the morning  4. Anxiety and depression.   Continue oral psychiatric medications, emotional support was provided. 5. Tobacco abuse. Nicotine patch. 6. Physical therapy evaluation, home health PT versus outpatient PT was recommended, depending on progress, Patient is  to ambulate again today 7. Sinus tachycardia at rest and even higher with ambulation and fever. Cardizem CD to be continued, stable, likely due to abdominal distention, Seems to be stable  Code Status:     Code Status Orders        Start     Ordered   04/17/16 1349  Full code  Continuous     04/17/16 1348    Code Status History    Date Active Date Inactive Code Status Order  ID Comments User Context   This patient has a current code status but no historical code status.     Disposition Plan: as per surgeon  Consultants:  Gen. surgery  Procedures:  Open sigmoid colectomy with colostomy  Antibiotics:  Zosyn  Time spent: 30 minutes. [facility]  [employer]

## 2016-04-24 NOTE — Consult Note (Signed)
WOC Nurse ostomy follow up Stoma type/location: LLQ Colostomy Stomal assessment/size: not assessed  Pouch was changed this AM.  Peristomal assessment:not assessed  Treatment options for stomal/peristomal skin: none Output soft brown stool Ostomy pouching: 2pc 2 1/4" pouch.  Education provided: Patient states pouch was changed earlier today.  Family friend not present today. Asked patient to have friend present in the AM for ostomy teaching.  Verb understanding   [place] [facility] [place]: Yes WOC team will follow Maple HudsonKaren Salome Cozby RN BSN [facility] Pager [phone]

## 2016-04-24 NOTE — Progress Notes (Signed)
Pt mother called at 5:30am and stated that pt called her and told her to come get her because she was being discharged. Pt mother is worried about daughter confusion at times. Pt will have a lucid conversation and then become forgetful at times.

## 2016-04-24 NOTE — Progress Notes (Signed)
7 Days Post-Op  Subjective: This patient status post Hartman's procedure she has drains in place a Penrose drain in her wound in the subcutaneous space and has good ostomy output she states she's feeling better every day. She did have a low-grade fever last night however.  Objective: Vital signs in last 24 hours: Temp:  [99.2 F (37.3 C)-100.4 F (38 C)] 99.3 F (37.4 C) (10/19 0615) Pulse Rate:  [103-105] 105 (10/19 0435) Resp:  [20-28] 20 (10/19 0435) BP: (122-130)/(75-86) 123/82 (10/19 0435) SpO2:  [92 %-96 %] 92 % (10/19 0435) Last BM Date: 04/24/16  Intake/Output from previous day: 10/18 0701 - 10/19 0700 In: 1060 [P.O.:640; I.V.:270; IV Piggyback:150] Out: 2880 [Urine:1900; Drains:300; Stool:680] Intake/Output this shift: No intake/output data recorded.  Physical exam:  Low-grade fever last night MAXIMUM TEMPERATURE of 100.4. No acute distress abdomen is soft nondistended nontympanitic and nontender wound is clean without erythema minimal drainage good ostomy output. Calves are nontender.  Chest is clear to auscultation cardiac is regular rate and rhythm  Lab Results: CBC   Recent Labs  04/22/16 0453  WBC 14.5*  HGB 9.1*  HCT 27.2*  PLT 316   BMET  Recent Labs  04/22/16 0453  04/23/16 0505 04/24/16 0516  NA 135  --  135  --   K 3.0*  < > 3.4* 3.2*  CL 104  --  103  --   CO2 23  --  22  --   GLUCOSE 73  --  69  --   BUN 5*  --  <5*  --   CREATININE 0.64  --  0.61  --   CALCIUM 7.5*  --  7.6*  --   < > = values in this interval not displayed. PT/INR No results for input(s): LABPROT, INR in the last 72 hours. ABG No results for input(s): PHART, HCO3 in the last 72 hours.  Invalid input(s): PCO2, PO2  Studies/Results: No results found.  Anti-infectives: Anti-infectives    Start     Dose/Rate Route Frequency Ordered Stop   04/17/16 1730  piperacillin-tazobactam (ZOSYN) IVPB 3.375 g     3.375 g 12.5 mL/hr over 240 Minutes Intravenous Every 8  hours 04/17/16 1348     04/17/16 0915  piperacillin-tazobactam (ZOSYN) IVPB 3.375 g     3.375 g 12.5 mL/hr over 240 Minutes Intravenous  Once 04/17/16 0908 04/17/16 1036      Assessment/Plan: s/p Procedure(s): EXPLORATORY LAPAROTOMY COLOSTOMY CREATION/HARTMANN PROCEDURE APPENDECTOMY   Patient slowly improving but she had a fever again last night and therefore I would recommend continuing her IV antibiotics and not discharging her just yet. If this fails to Route improve ordered her fever returns then I would recommend a CT scan of her abdomen and pelvis but at this point she is having good ostomy output and her wound appears clean and she has drains in her pelvis therefore the chance of an intra-abdominal abscess is small and her lungs seem to be improving.  Lattie Hawichard E Cooper, MD, FACS  04/24/2016

## 2016-04-25 ENCOUNTER — Inpatient Hospital Stay: Payer: Medicare Other

## 2016-04-25 ENCOUNTER — Encounter: Payer: Self-pay | Admitting: Radiology

## 2016-04-25 LAB — CBC WITH DIFFERENTIAL/PLATELET
Basophils Absolute: 0.2 10*3/uL — ABNORMAL HIGH (ref 0–0.1)
Basophils Relative: 1 %
EOS ABS: 0.5 10*3/uL (ref 0–0.7)
EOS PCT: 3 %
HCT: 28.4 % — ABNORMAL LOW (ref 35.0–47.0)
Hemoglobin: 9.6 g/dL — ABNORMAL LOW (ref 12.0–16.0)
LYMPHS ABS: 1.9 10*3/uL (ref 1.0–3.6)
Lymphocytes Relative: 12 %
MCH: 28.4 pg (ref 26.0–34.0)
MCHC: 34 g/dL (ref 32.0–36.0)
MCV: 83.5 fL (ref 80.0–100.0)
MONO ABS: 1.4 10*3/uL — AB (ref 0.2–0.9)
Monocytes Relative: 9 %
Neutro Abs: 11.7 10*3/uL — ABNORMAL HIGH (ref 1.4–6.5)
Neutrophils Relative %: 75 %
PLATELETS: 534 10*3/uL — AB (ref 150–440)
RBC: 3.4 MIL/uL — AB (ref 3.80–5.20)
RDW: 15.6 % — AB (ref 11.5–14.5)
WBC: 15.7 10*3/uL — AB (ref 3.6–11.0)

## 2016-04-25 LAB — POTASSIUM: POTASSIUM: 3.4 mmol/L — AB (ref 3.5–5.1)

## 2016-04-25 LAB — MAGNESIUM: Magnesium: 1.9 mg/dL (ref 1.7–2.4)

## 2016-04-25 MED ORDER — IOPAMIDOL (ISOVUE-300) INJECTION 61%: 15.0000 mL | INTRAVENOUS | Status: AC

## 2016-04-25 MED ORDER — ACETAMINOPHEN 325 MG PO TABS: 650.0000 mg | ORAL_TABLET | Freq: Four times a day (QID) | ORAL | Status: DC | PRN

## 2016-04-25 MED ORDER — POTASSIUM CHLORIDE CRYS ER 20 MEQ PO TBCR
40.0000 meq | EXTENDED_RELEASE_TABLET | Freq: Once | ORAL | Status: AC
  Administered 2016-04-25: 40 meq via ORAL
  Filled 2016-04-25: qty 2

## 2016-04-25 MED ORDER — IOPAMIDOL (ISOVUE-300) INJECTION 61%: 15.0000 mL | INTRAVENOUS | Status: DC

## 2016-04-25 MED ORDER — OXYCODONE-ACETAMINOPHEN 5-325 MG PO TABS
1.0000 | ORAL_TABLET | ORAL | Status: DC | PRN
  Administered 2016-04-25 (×2): 2 via ORAL
  Administered 2016-04-26 – 2016-04-28 (×2): 1 via ORAL
  Administered 2016-04-29: 2 via ORAL
  Filled 2016-04-25 (×2): qty 2
  Filled 2016-04-25 (×2): qty 1
  Filled 2016-04-25 (×2): qty 2

## 2016-04-25 MED ORDER — IOPAMIDOL (ISOVUE-300) INJECTION 61%
100.0000 mL | Freq: Once | INTRAVENOUS | Status: AC | PRN
  Administered 2016-04-25: 100 mL via INTRAVENOUS

## 2016-04-25 MED ORDER — OXYCODONE HCL 5 MG PO TABS
10.0000 mg | ORAL_TABLET | ORAL | Status: DC | PRN
  Administered 2016-04-25 – 2016-04-29 (×14): 10 mg via ORAL
  Filled 2016-04-25 (×14): qty 2

## 2016-04-25 NOTE — Progress Notes (Signed)
Patient ID: Sabrina Espinoza, female   DOB: 06-Jun-1966, 50 y.o.   MRN: [id number]   [facility] PROGRESS NOTE  Sabrina Espinoza WGN:[id number] DOB: 06-Jun-1966 DOA: 04/17/2016 PCP: Domenic SchwabLindley,Cheryl Paulette, FNP  HPI/Subjective: Patient feels better, less pain in abdomen. Denies nausea, vomiting. Has liquidy stool via stoma.   fever to 99.5 yesterday . Was seen by surgery, felt to be due to lung atelectasis, recommended to continue antibiotic therapy until afebrile for at least at 24 hours. Patient denies any shortness of breath, oxygenation is good at 95% on room air. Potassium level is low today again, despite supplementation , magnesium is 1.9 today. Surgery is holding CT scan of abdomen and pelvis with contrast. Patient was apparently confused last night.    Objective: Vitals:   04/25/16 0725 04/25/16 1224  BP: 111/73 112/73  Pulse: 87 88  Resp: 18 20  Temp: 98.2 F (36.8 C) 98.3 F (36.8 C)    Filed Weights   04/17/16 0458 04/18/16 0026 04/20/16 0431  Weight: 82.6 kg (182 lb) 92.1 kg (203 lb 0.7 oz) 96.3 kg (212 lb 3.2 oz)    ROS: Review of Systems  Constitutional: Negative for chills and fever.  Eyes: Negative for blurred vision.  Respiratory: Positive for cough. Negative for shortness of breath.   Cardiovascular: Negative for chest pain.  Gastrointestinal: Positive for abdominal pain. Negative for constipation, diarrhea, nausea and vomiting.  Musculoskeletal: Negative for joint pain.  Neurological: Negative for dizziness and headaches.   Exam: Physical Exam  Constitutional: She is oriented to person, place, and time.  HENT:  Nose: No mucosal edema.  Mouth/Throat: No oropharyngeal exudate or posterior oropharyngeal edema.  Eyes: Conjunctivae, EOM and lids are normal. Pupils are equal, round, and reactive to light.  Neck: No JVD present. Carotid bruit is not present. No edema present. No thyroid mass and no thyromegaly present.  Cardiovascular: S1 normal and S2  normal.  Tachycardia present.  Exam reveals no gallop.   No murmur heard. Pulses:      Dorsalis pedis pulses are 2+ on the right side, and 2+ on the left side.  Respiratory: No respiratory distress. She has decreased breath sounds in the right lower field and the left lower field. She has no wheezes. She has no rhonchi. She has no rales.  GI: Soft. Bowel sounds are normal. She exhibits distension. There is generalized tenderness.  Musculoskeletal:       Right shoulder: She exhibits no swelling.       Right ankle: She exhibits swelling.       Left ankle: She exhibits swelling.  Lymphadenopathy:    She has no cervical adenopathy.  Neurological: She is alert and oriented to person, place, and time. No cranial nerve deficit.  Skin: Skin is warm. No rash noted. Nails show no clubbing.  Psychiatric: She has a normal mood and affect.      Data Reviewed: Basic Metabolic Panel:  Recent Labs Lab 04/19/16 0401 04/20/16 0405 04/21/16 0422 04/22/16 0453 04/22/16 1814 04/23/16 0505 04/24/16 0516 04/25/16 0445  NA 136 136 133* 135  --  135  --   --   K 3.7 3.7 3.7 3.0* 3.1* 3.4* 3.2* 3.4*  CL 105 106 104 104  --  103  --   --   CO2 26 21* 25 23  --  22  --   --   GLUCOSE 77 53* 79 73  --  69  --   --   BUN 14 11  6 5*  --  <5*  --   --   CREATININE 0.82 0.82 0.78 0.64  --  0.61  --   --   CALCIUM 7.4* 7.7* 7.9* 7.5*  --  7.6*  --   --   MG 1.9 1.8  --  1.5*  --  1.6* 1.6* 1.9  PHOS 2.6 2.9  --  3.7  --   --   --   --    Liver Function Tests: No results for input(s): AST, ALT, ALKPHOS, BILITOT, PROT, ALBUMIN in the last 168 hours. No results for input(s): LIPASE, AMYLASE in the last 168 hours. CBC:  Recent Labs Lab 04/19/16 0401 04/20/16 0405 04/22/16 0453 04/25/16 0445  WBC 14.4* 12.9* 14.5* 15.7*  NEUTROABS  --   --  11.7* 11.7*  HGB 10.3* 10.1* 9.1* 9.6*  HCT 31.0* 30.5* 27.2* 28.4*  MCV 86.2 85.8 85.4 83.5  PLT 253 295 316 534*   Cardiac Enzymes: No results for  input(s): CKTOTAL, CKMB, CKMBINDEX, TROPONINI in the last 168 hours. CBG: No results for input(s): GLUCAP in the last 168 hours.  Recent Results (from the past 240 hour(s))  MRSA PCR Screening     Status: None   Collection Time: 04/18/16  5:04 AM  Result Value Ref Range Status   MRSA by PCR NEGATIVE NEGATIVE Final    Comment:        The GeneXpert MRSA Assay (FDA approved for NASAL specimens only), is one component of a comprehensive MRSA colonization surveillance program. It is not intended to diagnose MRSA infection nor to guide or monitor treatment for MRSA infections.      Scheduled Meds: . clonazePAM  0.5 mg Oral TID  . diltiazem  180 mg Oral Daily  . enoxaparin (LOVENOX) injection  40 mg Subcutaneous Q24H  . gabapentin  400 mg Oral TID  . hydrOXYzine  50 mg Oral BID  . lamoTRIgine  150 mg Oral BID  . loratadine  10 mg Oral QPM  . mometasone-formoterol  2 puff Inhalation BID  . montelukast  10 mg Oral QHS  . nicotine  21 mg Transdermal Daily  . nortriptyline  100 mg Oral QHS  . piperacillin-tazobactam (ZOSYN)  IV  3.375 g Intravenous Q8H  . rOPINIRole  2 mg Oral QHS   Continuous Infusions:    Assessment/Plan:  1. COPD.  Respiratory status stable, Remains on room air, oxygenation is good.  Continue inhaler and Xopenex treatments, Incentive therapy.  Last chest x-ray was negative. Patient is continued on the Zosyn, suspect atelectasis, however, pneumonia, still possibility due to poor ambulation. 2. Perforated sigmoid diverticulitis requiring surgical intervention.  Patientis being continued on a regular diet.Now off  PCA pump. The pain control is satisfactory, ambulation was recommended.  Patientis being continued on IV Zosyn due to ongoing low-grade fever, which is subsiding . Surgery recommends CT of abdomen and pelvis today .  3. Hypomagnesemia and hypokalemia. Replaced again today , Recheck in the morning  4. Anxiety and depression.   Continue oral psychiatric  medications, seems to be more comfortable today, likely pain control is better. 5. Tobacco abuse. Nicotine patch. 6. Physical therapy evaluation, home health PT versus outpatient PT was recommended, depending on progress, Patient is to ambulate again today. Refuses rehabilitation placement 7. Sinus tachycardia at rest and even higher with ambulation and fever. Cardizem CD to be continued, stable.  8. Fever, etiology of fever remains unclear, possibly related to postoperative pneumonia, rule out abdominal process, patient is  being continued on Zosyn intravenously, CT of abdomen and pelvis is pending, chest x-ray was normal on 04/21/2016.  Code Status:     Code Status Orders        Start     Ordered   04/17/16 1349  Full code  Continuous     04/17/16 1348    Code Status History    Date Active Date Inactive Code Status Order ID Comments User Context   This patient has a current code status but no historical code status.     Disposition Plan: as per surgeon  Consultants:  Gen. surgery  Procedures:  Open sigmoid colectomy with colostomy  Antibiotics:  Zosyn  Time spent: 30 minutes. [facility]  [facility]

## 2016-04-25 NOTE — Progress Notes (Signed)
8 Days Post-Op  Subjective: Status post Hartman's procedure patient feels well there were reports of her being confused last night. I discussed placement with her mother last night patient is reluctant to go to placement.  Nursing is present during the exam.  Objective: Vital signs in last 24 hours: Temp:  [98.2 F (36.8 C)-99.5 F (37.5 C)] 98.2 F (36.8 C) (10/20 0725) Pulse Rate:  [87-113] 87 (10/20 0725) Resp:  [18-22] 18 (10/20 0725) BP: (111-131)/(65-76) 111/73 (10/20 0725) SpO2:  [93 %-95 %] 94 % (10/20 0725) Last BM Date: 04/25/16  Intake/Output from previous day: 10/19 0701 - 10/20 0700 In: 705 [P.O.:500; IV Piggyback:205] Out: 2405 [Urine:1950; Drains:55; Stool:400] Intake/Output this shift: Total I/O In: -  Out: 330 [Urine:200; Drains:30; Stool:100]  Physical exam:  Low grade temperature last night. Chest is clear to auscultation abdomen is soft and minimally distended nontender wounds are clean Penrose is in place some staples have pulled apart in the lower portion but there is granulation tissue and minimal draining purulence if any. Ostomy is functional. Calves are nontender SCDs are on the floor.  Lab Results: CBC   Recent Labs  04/25/16 0445  WBC 15.7*  HGB 9.6*  HCT 28.4*  PLT 534*   BMET  Recent Labs  04/23/16 0505 04/24/16 0516 04/25/16 0445  NA 135  --   --   K 3.4* 3.2* 3.4*  CL 103  --   --   CO2 22  --   --   GLUCOSE 69  --   --   BUN <5*  --   --   CREATININE 0.61  --   --   CALCIUM 7.6*  --   --    PT/INR No results for input(s): LABPROT, INR in the last 72 hours. ABG No results for input(s): PHART, HCO3 in the last 72 hours.  Invalid input(s): PCO2, PO2  Studies/Results: No results found.  Anti-infectives: Anti-infectives    Start     Dose/Rate Route Frequency Ordered Stop   04/17/16 1730  piperacillin-tazobactam (ZOSYN) IVPB 3.375 g     3.375 g 12.5 mL/hr over 240 Minutes Intravenous Every 8 hours 04/17/16 1348     04/17/16 0915  piperacillin-tazobactam (ZOSYN) IVPB 3.375 g     3.375 g 12.5 mL/hr over 240 Minutes Intravenous  Once 04/17/16 0908 04/17/16 1036      Assessment/Plan: s/p Procedure(s): EXPLORATORY LAPAROTOMY COLOSTOMY CREATION/HARTMANN PROCEDURE APPENDECTOMY   Low grade temp and elevated white blood cell count. I will obtain a CT scan the abdomen and pelvis to ensure that this is not an intra-abdominal process but I still suspect that this is pulmonary as she has been walking but spends most of her time laying flat on her back. Discussed with nursing.  Lattie Hawichard E Kieley Akter, MD, FACS  04/25/2016

## 2016-04-25 NOTE — Consult Note (Signed)
WOC Nurse ostomy follow up Stoma type/location: LUQ Colostomy Stomal assessment/size: 2 1/4" Pink stoma, sutures intact Peristomal assessment: Medical Adhesive Related Skin Injury(MRASI) Treatment options for stomal/peristomal skin: Cleansed with soap and water, patted dry, applied stoma powder to peristomal area, applied Ceraplus barrier ring to denuded skin, applied 2 3/4" Flat two-piece bag.  Output: Soft brown stool Ostomy pouching: 2pc. 2 3/4" Flat two-piece   Education provided: Educated patient on peristomal care, had asked family member to be present for assistance with bag changes, no one here today. Talked about stoma size and shrinkage of stoma over time. Patient tearful and minimally participative, emotional support provided.  [place] [facility] [place]: Yes WOC team will follow.   Maple HudsonKaren Lavone Barrientes RN BSN CWON Pager [phone]

## 2016-04-25 NOTE — Progress Notes (Addendum)
MEDICATION RELATED CONSULT NOTE - INITIAL   Pharmacy Consulted to assist in electrolyte replacement and monitoring Indication: hypomagnesemia, hypokalemia  No Known Allergies  Patient Measurements: Height: 5\' 9"  (175.3 cm) Weight: 212 lb 3.2 oz (96.3 kg) IBW/kg (Calculated) : 66.2  Vital Signs: Temp: 98.2 F (36.8 C) (10/20 0725) Temp Source: Oral (10/20 0725) BP: 111/73 (10/20 0725) Pulse Rate: 87 (10/20 0725) Intake/Output from previous day: 10/19 0701 - 10/20 0700 In: 705 [P.O.:500; IV Piggyback:205] Out: 2405 [Urine:1950; Drains:55; Stool:400] Intake/Output from this shift: No intake/output data recorded.  Labs:  Recent Labs  04/23/16 0505 04/24/16 0516 04/25/16 0445  WBC  --   --  15.7*  HGB  --   --  9.6*  HCT  --   --  28.4*  PLT  --   --  534*  CREATININE 0.61  --   --   MG 1.6* 1.6* 1.9   Estimated Creatinine Clearance: 107.3 mL/min (by C-G formula based on SCr of 0.61 mg/dL).  BMP Latest Ref Rng & Units 04/25/2016 04/24/2016 04/23/2016  Glucose 65 - 99 mg/dL - - 69  BUN 6 - 20 mg/dL - - <8(G<5(L)  Creatinine 9.560.44 - 1.00 mg/dL - - 2.130.61  BUN/Creat Ratio 9 - 23 - - -  Sodium 135 - 145 mmol/L - - 135  Potassium 3.5 - 5.1 mmol/L 3.4(L) 3.2(L) 3.4(L)  Chloride 101 - 111 mmol/L - - 103  CO2 22 - 32 mmol/L - - 22  Calcium 8.9 - 10.3 mg/dL - - 7.6(L)   Assessment: 49 yof cc abdominal pain s/p open sigmoid colectomy with end colostomy, open drainage of pelvic abscess, appendectomy, placement of pelvic drains. Pharmacy consulted to replace electrolytes.  Goal of Therapy:  K 3.5 to 5 Mg 1.7 to 2.4 Phos 2.5 to 4.6  Plan:  10/19 K = 3.2, Magnesium= 1.6 -->mag sulfate 4 g IV BID and KCl 10 mEq IV x 4 were ordered  10/20 K = 3.4, Magnesium = 1.9 --> will give KCl 40 mEq PO x 1 and one more dose of currently scheduled magnesium 4 mg IV and follow up with am labs  Horris LatinoHolly Desere Gwin, PharmD Pharmacy Resident 04/25/2016 7:38 AM

## 2016-04-26 LAB — CBC WITH DIFFERENTIAL/PLATELET
Basophils Absolute: 0.1 10*3/uL (ref 0–0.1)
Basophils Relative: 1 %
Eosinophils Absolute: 0.6 10*3/uL (ref 0–0.7)
Eosinophils Relative: 4 %
HEMATOCRIT: 28.3 % — AB (ref 35.0–47.0)
HEMOGLOBIN: 9.6 g/dL — AB (ref 12.0–16.0)
LYMPHS PCT: 12 %
Lymphs Abs: 1.7 10*3/uL (ref 1.0–3.6)
MCH: 28.2 pg (ref 26.0–34.0)
MCHC: 33.7 g/dL (ref 32.0–36.0)
MCV: 83.7 fL (ref 80.0–100.0)
MONO ABS: 1.3 10*3/uL — AB (ref 0.2–0.9)
MONOS PCT: 9 %
NEUTROS ABS: 10.6 10*3/uL — AB (ref 1.4–6.5)
NEUTROS PCT: 74 %
Platelets: 556 10*3/uL — ABNORMAL HIGH (ref 150–440)
RBC: 3.38 MIL/uL — ABNORMAL LOW (ref 3.80–5.20)
RDW: 15.5 % — AB (ref 11.5–14.5)
WBC: 14.3 10*3/uL — ABNORMAL HIGH (ref 3.6–11.0)

## 2016-04-26 LAB — MAGNESIUM: Magnesium: 1.9 mg/dL (ref 1.7–2.4)

## 2016-04-26 LAB — BASIC METABOLIC PANEL
ANION GAP: 6 (ref 5–15)
CHLORIDE: 103 mmol/L (ref 101–111)
CO2: 28 mmol/L (ref 22–32)
Calcium: 7.7 mg/dL — ABNORMAL LOW (ref 8.9–10.3)
Creatinine, Ser: 0.56 mg/dL (ref 0.44–1.00)
GFR calc Af Amer: >60 mL/min (ref >60)
GFR calc non Af Amer: >60 mL/min (ref >60)
GLUCOSE: 86 mg/dL (ref 65–99)
POTASSIUM: 3.4 mmol/L — AB (ref 3.5–5.1)
Sodium: 137 mmol/L (ref 135–145)

## 2016-04-26 MED ORDER — POTASSIUM CHLORIDE 10 MEQ/100ML IV SOLN
10.0000 meq | INTRAVENOUS | Status: AC
  Administered 2016-04-26 (×3): 10 meq via INTRAVENOUS
  Filled 2016-04-26 (×3): qty 100

## 2016-04-26 MED ORDER — POTASSIUM CHLORIDE 20 MEQ PO PACK
20.0000 meq | PACK | Freq: Once | ORAL | Status: AC
  Administered 2016-04-26: 20 meq via ORAL
  Filled 2016-04-26: qty 1

## 2016-04-26 NOTE — Progress Notes (Signed)
Dr. Excell Seltzerooper informed me that patient would be ready for discharge to rehab facility today and to contact social work to begin assessment and placement. Weekend social work phone called multiple times, for several hours with no answer. Larita FifeLynn, case manager called, also with no answer. Social worker, Engineer, siteChristine, found on 2A and was informed verbally, in person, that patient would be discharged when rehab bed available. Social worker stated that she had not had a chance to [facility] yet and would handle it when she got to it.

## 2016-04-26 NOTE — Progress Notes (Signed)
9 Days Post-Op  Subjective: Patient doing well today she is expressing some belligerence and frustration. She does not want to go to rehabilitation but has agreed to do so. She denies fevers or chills and is tolerating a diet.  Objective: Vital signs in last 24 hours: Temp:  [98.3 F (36.8 C)-98.8 F (37.1 C)] 98.3 F (36.8 C) (10/21 0647) Pulse Rate:  [88-98] 98 (10/21 0647) Resp:  [16-20] 20 (10/21 0647) BP: (110-123)/(69-79) 110/69 (10/21 0647) SpO2:  [92 %-96 %] 92 % (10/21 0647) Last BM Date: 04/25/16  Intake/Output from previous day: 10/20 0701 - 10/21 0700 In: 360 [P.O.:360] Out: 2565 [Urine:1600; Drains:65; Stool:900] Intake/Output this shift: No intake/output data recorded.  Physical exam:  Wound is clean with minimal seropurulent drainage around the Penrose drains no erythema granulation tissue is present the lower opening is large were some staples up old but there is good drainage. Ostomy is functional. Calves are nontender.  Lab Results: CBC   Recent Labs  04/25/16 0445 04/26/16 0517  WBC 15.7* 14.3*  HGB 9.6* 9.6*  HCT 28.4* 28.3*  PLT 534* 556*   BMET  Recent Labs  04/25/16 0445 04/26/16 0517  NA  --  137  K 3.4* 3.4*  CL  --  103  CO2  --  28  GLUCOSE  --  86  BUN  --  <5*  CREATININE  --  0.56  CALCIUM  --  7.7*   PT/INR No results for input(s): LABPROT, INR in the last 72 hours. ABG No results for input(s): PHART, HCO3 in the last 72 hours.  Invalid input(s): PCO2, PO2  Studies/Results: Ct Abdomen Pelvis W Contrast  Result Date: 04/25/2016 CLINICAL DATA:  Abdominal pain, status post sigmoid colectomy, pelvic abscess drainage EXAM: CT ABDOMEN AND PELVIS WITH CONTRAST TECHNIQUE: Multidetector CT imaging of the abdomen and pelvis was performed using the standard protocol following bolus administration of intravenous contrast. CONTRAST:  100mL ISOVUE-300 IOPAMIDOL (ISOVUE-300) INJECTION 61% COMPARISON:  04/17/2016 FINDINGS: Lower chest:  Lung bases shows bilateral small pleural effusion. Bilateral lower lobe posterior atelectasis or infiltrate. Hepatobiliary: There is fatty infiltration of the liver. No focal hepatic mass. Pancreas: Enhanced pancreas is unremarkable. Spleen: Enhanced spleen is unremarkable. Adrenals/Urinary Tract: No adrenal gland mass. Enhanced kidneys are symmetrical in size. No hydronephrosis or hydroureter. Punctate nonobstructive calcified calculus upper pole of the left kidney measures 2 mm. Delayed renal images shows bilateral renal symmetrical excretion. Bilateral visualized proximal ureter is unremarkable. No calcified calculi are noted within urinary bladder. No thickening of urinary bladder wall. Stomach/Bowel: Small hiatal hernia is noted. No gastric outlet obstruction. No small bowel obstruction. Mild distended small bowel loops with some air-fluid levels in lower abdomen probable mild ileus. No transition point in caliber of small bowel to suggest small bowel obstruction. No pericecal inflammation. The patient is status post appendectomy. The terminal ileum is unremarkable. Moderate colonic stool are noted within transverse colon. Moderate colonic stool noted within splenic flexure of the colon and left colon. The patient is status post sigmoidectomy there is a colostomy in left lower quadrant of the abdomen. Minimal stranding of subcutaneous fat surrounding the colostomy probable mild edema or post surgical changes. There is a midline abdominal wall scar. There are skin staples upper and mid aspect of the scar. Post surgical material noted within subcutaneous fat along the scar. Small amount of air is noted along the scar. Please note there is partial anterior opening of lower aspect of the scar. No definite abscess or  fluid collection is noted along the surgical scar. Two postsurgical drains are noted within pelvis. There is no evidence of pelvic abscess. Small residual stool noted within distal sigmoid colon and  rectum remnant. Vascular/Lymphatic: No aortic aneurysm. No retroperitoneal or mesenteric adenopathy. Atherosclerotic calcifications of abdominal aorta and iliac arteries. Reproductive: The uterus is atrophic. The uterus is anteflexed. No adnexal masses noted. Other: Mild anasarca infiltration of subcutaneous fat bilateral flank wall. There is no evidence of free abdominal or pelvic air. No abdominal ascites. Trace ascites noted posterior cul-de-sac. Musculoskeletal: No destructive bony lesions are noted. Mild degenerative changes lower thoracic and lumbar spine. IMPRESSION: 1. Mild fatty infiltration of the liver. Left nonobstructive nephrolithiasis. No hydronephrosis or hydroureter. 2. Status post sigmoidectomy. There is a colostomy in left lower quadrant. Small amount of fluid and mild stranding of subcutaneous fat adjacent to colostomy most likely edema or postsurgical in nature. No definite evidence of abscess. 3. There is a midline abdominal wall scar. There are skin staples upper and mid aspect of the scar. Post surgical material noted within subcutaneous fat along the scar. Small amount of air is noted along the scar. Please note there is partial anterior opening of lower aspect of the scar. No definite abscess or fluid collection is noted along the surgical scar. 4. Postsurgical drains are noted within pelvis. No evidence of pelvic abscess. 5. Moderate stool within colon without definite evidence of colonic obstruction. No evidence of acute colitis. Mild small bowel distension with some air-fluid levels probable mild ileus. 6. Mild anasarca infiltration subcutaneous fat bilateral flank. 7. No hydronephrosis or hydroureter. Electronically Signed   By: Natasha Mead M.D.   On: 04/25/2016 14:44    Anti-infectives: Anti-infectives    Start     Dose/Rate Route Frequency Ordered Stop   04/17/16 1730  piperacillin-tazobactam (ZOSYN) IVPB 3.375 g     3.375 g 12.5 mL/hr over 240 Minutes Intravenous Every 8 hours  04/17/16 1348     04/17/16 0915  piperacillin-tazobactam (ZOSYN) IVPB 3.375 g     3.375 g 12.5 mL/hr over 240 Minutes Intravenous  Once 04/17/16 0908 04/17/16 1036      Assessment/Plan: s/p Procedure(s): EXPLORATORY LAPAROTOMY COLOSTOMY CREATION/HARTMANN PROCEDURE APPENDECTOMY   No fevers today and CT scan yesterday demonstrated no sign of pelvic abscess. I believe the patient can be discharged to a rehabilitation facility to follow-up in our office in a week and I would send her home on oral antibiotics. This is discussed with the patient and while she is reluctant to go to rehabilitation she has agreed to do so at this point and I believe that it is greatly beneficial because she needs to become more familiar with colostomy care she has complex wound care and drains in place. I discussed this with nursing and we will discuss with social services and attempt at placement.  Lattie Haw, MD, FACS  04/26/2016

## 2016-04-26 NOTE — [place] FL2 (Signed)
[place] MEDICAID FL2 LEVEL OF CARE SCREENING TOOL     IDENTIFICATION  Patient Name: Sabrina Espinoza Birthdate: December 16, 1965 Sex: female Admission Date (Current Location): 04/17/2016  [place]  [place] [id number] [facility] Facility and Address:  [facility], [address], [place], [place] [address]      Provider Number: [id number]  Attending Physician Name and Address:  Ricarda Frameharles Woodham, MD  Relative Name and Phone Number:       Current Level of Care: Hospital Recommended Level of Care: Skilled Nursing Facility Prior Approval Number:    Date Approved/Denied:   PASRR Number: [id number]  Discharge Plan: SNF    Current Diagnoses: Patient Active Problem List   Diagnosis Date Noted  . Diverticulitis large intestine 04/17/2016  . Diverticulitis of colon with perforation   . Pallor 03/11/2016  . Tongue papillae atrophy 03/11/2016  . Encounter for medication monitoring 03/11/2016  . Peripheral neuropathy (HCC) 03/11/2016  . Thrush 03/11/2016  . Right cervical radiculopathy 01/15/2016  . Chronic insomnia 11/02/2015  . Right knee pain 08/22/2015  . Midline low back pain without sciatica 05/07/2015  . Elevated blood pressure 04/20/2015  . Polypharmacy 03/22/2015  . Foot pain, right 03/16/2015  . Pain pharynx 03/08/2015  . GERD without esophagitis 03/08/2015  . Abnormal mammogram of right breast 03/07/2015  . Degenerative disc disease, cervical 02/26/2015  . Bilateral leg cramps 02/26/2015  . Skin lesions, generalized 02/26/2015  . Allergic rhinitis with postnasal drip 01/31/2015  . Chronic sinusitis with recurrent bronchitis 01/31/2015  . Oral thrush 01/31/2015  . History of cocaine use 01/16/2015  . 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 Chronic hepatitis C without hepatic coma (HCC) 01/16/2015  . Chronic constipation 01/16/2015  . COPD, moderate (HCC) 01/16/2015  . Cigarette smoker 01/16/2015  . Bipolar 1 disorder, mixed,  partial remission (HCC) 01/16/2015  . Migraine without aura and without status migrainosus, not intractable 01/16/2015  . Depression with anxiety 01/16/2015  . Cephalalgia 03/06/2014  . Imbalance 03/06/2014  . Insomnia due to medical condition 03/06/2014    Orientation RESPIRATION BLADDER Height & Weight     Self, Time, Situation, Place  Normal Continent Weight: 212 lb 3.2 oz (96.3 kg) Height:  5\' 9"  (175.3 cm)  BEHAVIORAL SYMPTOMS/MOOD NEUROLOGICAL BOWEL NUTRITION STATUS   (None)  (None) Continent Diet (Regular)  AMBULATORY STATUS COMMUNICATION OF NEEDS Skin   Extensive Assist Verbally Other (Comment) (Incision- Clsoed Abdomen; Closed System Drain 2 Right RLQ Bulb (JP) 19 Fr.; Closed System Drain 1 Left LLQ Bulb (JP) 19 Fr.   )                       Personal Care Assistance Level of Assistance  Bathing, Feeding, Dressing Bathing Assistance: Limited assistance Feeding assistance: Independent Dressing Assistance: Limited assistance     Functional Limitations Info  Sight, Hearing, Speech Sight Info: Adequate Hearing Info: Adequate Speech Info: Adequate    SPECIAL CARE FACTORS FREQUENCY  PT (By licensed PT)     PT Frequency:  (5)              Contractures      Additional Factors Info  Code Status, Allergies Code Status Info: Full Code Allergies Info: No Known Allergies            Current Medications (04/26/2016):  This is the current hospital active medication list Current Facility-Administered Medications  Medication Dose Route Frequency Provider Last Rate Last Dose  . acetaminophen (TYLENOL) tablet 650  mg  650 mg Oral Q6H PRN Alford Highland, MD      . clonazePAM Scarlette Calico) tablet 0.5 mg  0.5 mg Oral TID Alford Highland, MD   0.5 mg at 04/26/16 1045  . diltiazem (CARDIZEM CD) 24 hr capsule 180 mg  180 mg Oral Daily Alford Highland, MD   180 mg at 04/26/16 1044  . diphenhydrAMINE (BENADRYL) injection 25 mg  25 mg Intravenous Q6H PRN Ricarda Frame, MD       . enoxaparin (LOVENOX) injection 40 mg  40 mg Subcutaneous Q24H Ricarda Frame, MD   40 mg at 04/25/16 2226  . gabapentin (NEURONTIN) capsule 400 mg  400 mg Oral TID Ricarda Frame, MD   400 mg at 04/26/16 1045  . hydrALAZINE (APRESOLINE) injection 10 mg  10 mg Intravenous Q2H PRN Ricarda Frame, MD      . hydrOXYzine (ATARAX/VISTARIL) tablet 50 mg  50 mg Oral BID Ricarda Frame, MD   50 mg at 04/26/16 1044  . lamoTRIgine (LAMICTAL) tablet 150 mg  150 mg Oral BID Alford Highland, MD   150 mg at 04/26/16 1044  . levalbuterol (XOPENEX) nebulizer solution 1.25 mg  1.25 mg Nebulization Q8H PRN Ricarda Frame, MD      . loratadine (CLARITIN) tablet 10 mg  10 mg Oral QPM Ricarda Frame, MD   10 mg at 04/25/16 1844  . LORazepam (ATIVAN) injection 1 mg  1 mg Intravenous Q4H PRN Leafy Ro, MD   1 mg at 04/21/16 1502  . mometasone-formoterol (DULERA) 200-5 MCG/ACT inhaler 2 puff  2 puff Inhalation BID Ricarda Frame, MD   2 puff at 04/25/16 2236  . montelukast (SINGULAIR) tablet 10 mg  10 mg Oral QHS Alford Highland, MD   10 mg at 04/25/16 2228  . nicotine (NICODERM CQ - dosed in mg/24 hours) patch 21 mg  21 mg Transdermal Daily Alford Highland, MD   21 mg at 04/26/16 1044  . nortriptyline (PAMELOR) capsule 100 mg  100 mg Oral QHS Alford Highland, MD   100 mg at 04/25/16 2227  . ondansetron (ZOFRAN) injection 4 mg  4 mg Intravenous Q6H PRN Ricarda Frame, MD   4 mg at 04/17/16 1256  . oxyCODONE (Oxy IR/ROXICODONE) immediate release tablet 10 mg  10 mg Oral Q4H PRN Gladis Riffle, MD   10 mg at 04/26/16 1044  . oxyCODONE-acetaminophen (PERCOCET/ROXICET) 5-325 MG per tablet 1-2 tablet  1-2 tablet Oral Q4H PRN Gladis Riffle, MD   1 tablet at 04/26/16 1445  . piperacillin-tazobactam (ZOSYN) IVPB 3.375 g  3.375 g Intravenous Q8H Ricarda Frame, MD   3.375 g at 04/26/16 1404  . rOPINIRole (REQUIP) tablet 2 mg  2 mg Oral QHS Alford Highland, MD   2 mg at 04/25/16 2227     Discharge  Medications: Please see discharge summary for a list of discharge medications.  Relevant Imaging Results:  Relevant Lab Results:   Additional Information SSN [id number]  Verta Ellen Sunkins, LCSW

## 2016-04-26 NOTE — Evaluation (Deleted)
Physical Therapy Evaluation Patient Details Name: Sabrina Espinoza MRN: [id number] DOB: 17-Dec-1965 Today's Date: 04/26/2016   History of Present Illness  Sabrina Espinoza is a 49yo white female who [facility] with ABD pain, and later that day required colectomy, cyst drainage, apendectomy, and placement of 2 pelvic drainage tubes. At baseline, pt is fully independent, lives alone, without mobility restrictions or limitations. PMH: COPD.   Clinical Impression  Pt was seen for assessment of current mobility and she is much more lethargic today, suddenly less stable and reliant on assistance to walk.  Pt is unsteady around corners and requiring help to both get in and out of bed, in contrast to recent PT visit.  Will progress her acutely as tolerated to see if she can regain her functional level as previous visit.    Follow Up Recommendations Other (comment);SNF (outpa)    Equipment Recommendations  None recommended by PT    Recommendations for Other Services       Precautions / Restrictions Precautions Precautions: Fall Precaution Comments: JP drains and IV Restrictions Weight Bearing Restrictions: No      Mobility  Bed Mobility Overal bed mobility: Needs Assistance Bed Mobility: Supine to Sit;Sit to Supine     Supine to sit: Min assist;Min guard Sit to supine: Min guard;Min assist   General bed mobility comments: using railing but lethargic and nursing reporting no pain meds given  Transfers Overall transfer level: Needs assistance Equipment used: 1 person hand held assist (IV pole) Transfers: Sit to/from [employer] CorporationStand;Stand Pivot Transfers Sit to Stand: Min guard Stand pivot transfers: Min guard       General transfer comment: requires initial standing steadying assist  Ambulation/Gait Ambulation/Gait assistance: Min guard;Min assist Ambulation Distance (Feet): 300 Feet Assistive device: 1 person hand held assist (IV pole) Gait Pattern/deviations: Step-through  pattern;Shuffle;Decreased stride length;Wide base of support;Drifts right/left Gait velocity: reduced Gait velocity interpretation: Below normal speed for age/gender General Gait Details: pulses and O2 sats were stable, sat 92 % pre and post gait  Stairs            Wheelchair Mobility    Modified Rankin (Stroke Patients Only)       Balance                                             Pertinent Vitals/Pain Pain Assessment: Faces Faces Pain Scale: Hurts little more Pain Location: abdomen Pain Descriptors / Indicators: Operative site guarding Pain Intervention(s): Monitored during session;Repositioned    Home Living                        Prior Function                 Hand Dominance        Extremity/Trunk Assessment                         Communication      Cognition Arousal/Alertness: Lethargic Behavior During Therapy: Flat affect Overall Cognitive Status: Within Functional Limits for tasks assessed                      General Comments      Exercises     Assessment/Plan    PT Assessment    PT Problem List  PT Treatment Interventions      PT Goals (Current goals can be found in the Care Plan section)  Acute Rehab PT Goals Patient Stated Goal: to try to walk some    Frequency Min 2X/week   Barriers to discharge        Co-evaluation               End of Session Equipment Utilized During Treatment: Gait belt Activity Tolerance: Patient tolerated treatment well;Patient limited by pain;Patient limited by fatigue Patient left: in bed;with call bell/phone within reach;with bed alarm set (declined to sit EOB) Nurse Communication: Mobility status         Time: 1000-1024 PT Time Calculation (min) (ACUTE ONLY): 24 min   Charges:     PT Treatments $Gait Training: 8-22 mins $Therapeutic Activity: 8-22 mins   PT G Codes:        Ivar Drape 10-May-2016, 1:40 PM     Samul Dada, PT MS Acute Rehab Dept. Number: [facility] [id number] and [facility] [phone]

## 2016-04-26 NOTE — Progress Notes (Signed)
MEDICATION RELATED CONSULT NOTE - INITIAL   Pharmacy Consulted to assist in electrolyte replacement and monitoring Indication: hypomagnesemia, hypokalemia  No Known Allergies  Patient Measurements: Height: 5\' 9"  (175.3 cm) Weight: 212 lb 3.2 oz (96.3 kg) IBW/kg (Calculated) : 66.2  Vital Signs: Temp: 98.3 F (36.8 C) (10/21 0647) Temp Source: Oral (10/21 0647) BP: 110/69 (10/21 0647) Pulse Rate: 98 (10/21 0647) Intake/Output from previous day: 10/20 0701 - 10/21 0700 In: 360 [P.O.:360] Out: 2565 [Urine:1600; Drains:65; Stool:900] Intake/Output from this shift: No intake/output data recorded.  Labs:  Recent Labs  04/24/16 0516 04/25/16 0445 04/26/16 0517  WBC  --  15.7* 14.3*  HGB  --  9.6* 9.6*  HCT  --  28.4* 28.3*  PLT  --  534* 556*  CREATININE  --   --  0.56  MG 1.6* 1.9 1.9   Estimated Creatinine Clearance: 107.3 mL/min (by C-G formula based on SCr of 0.56 mg/dL).  BMP Latest Ref Rng & Units 04/26/2016 04/25/2016 04/24/2016  Glucose 65 - 99 mg/dL 86 - -  BUN 6 - 20 mg/dL <1(O<5(L) - -  Creatinine 1.090.44 - 1.00 mg/dL 6.040.56 - -  BUN/Creat Ratio 9 - 23 - - -  Sodium 135 - 145 mmol/L 137 - -  Potassium 3.5 - 5.1 mmol/L 3.4(L) 3.4(L) 3.2(L)  Chloride 101 - 111 mmol/L 103 - -  CO2 22 - 32 mmol/L 28 - -  Calcium 8.9 - 10.3 mg/dL 7.7(L) - -   Assessment: 49 yof cc abdominal pain s/p open sigmoid colectomy with end colostomy, open drainage of pelvic abscess, appendectomy, placement of pelvic drains. Pharmacy consulted to replace electrolytes.  Goal of Therapy:  K 3.5 to 5 Mg 1.7 to 2.4 Phos 2.5 to 4.6  Plan:  K= 3.4.   Will give KCl 10 meQ IV x 3. Will recheck K with am labs.    Demetrius Charityeldrin D. Marilyn Wing, PharmD  04/26/2016 9:28 AM

## 2016-04-26 NOTE — Progress Notes (Signed)
Patient ID: Sabrina Espinoza, female   DOB: September 16, 1965, 50 y.o.   MRN: [id number]   [employer] PROGRESS NOTE  Sabrina Espinoza:[id number] DOB: Mar 03, 1966 DOA: 04/17/2016 PCP: Domenic Schwab, FNP  HPI/Subjective:   Patient has intermittent abdominal pain, no nausea or vomiting ambulated with physical therapy        Objective: Vitals:   04/25/16 2051 04/26/16 0647  BP: 123/79 110/69  Pulse: 97 98  Resp: 16 20  Temp: 98.8 F (37.1 C) 98.3 F (36.8 C)    Filed Weights   04/17/16 0458 04/18/16 0026 04/20/16 0431  Weight: 182 lb (82.6 kg) 203 lb 0.7 oz (92.1 kg) 212 lb 3.2 oz (96.3 kg)    ROS: Review of Systems  Constitutional: Negative for chills and fever.  Eyes: Negative for blurred vision.  Respiratory: Positive for cough. Negative for shortness of breath.   Cardiovascular: Negative for chest pain.  Gastrointestinal: Positive for abdominal pain. Negative for constipation, diarrhea, nausea and vomiting.  Musculoskeletal: Negative for joint pain.  Neurological: Negative for dizziness and headaches.   Exam: Physical Exam  Constitutional: She is oriented to person, place, and time.  HENT:  Nose: No mucosal edema.  Mouth/Throat: No oropharyngeal exudate or posterior oropharyngeal edema.  Eyes: Conjunctivae, EOM and lids are normal. Pupils are equal, round, and reactive to light.  Neck: No JVD present. Carotid bruit is not present. No edema present. No thyroid mass and no thyromegaly present.  Cardiovascular: Regular rhythm, S1 normal and S2 normal.  Exam reveals no gallop.   No murmur heard. Pulses:      Dorsalis pedis pulses are 2+ on the right side, and 2+ on the left side.  Respiratory: No respiratory distress. She has no decreased breath sounds. She has no wheezes. She has no rhonchi. She has no rales.  GI: Soft. Bowel sounds are normal. She exhibits distension. There is no tenderness.  Musculoskeletal:       Right shoulder: She exhibits no  swelling.       Right ankle: She exhibits swelling.       Left ankle: She exhibits swelling.  Lymphadenopathy:    She has no cervical adenopathy.  Neurological: She is alert and oriented to person, place, and time. No cranial nerve deficit.  Skin: Skin is warm. No rash noted. Nails show no clubbing.  Psychiatric: She has a normal mood and affect.      Data Reviewed: Basic Metabolic Panel:  Recent Labs Lab 04/20/16 0405 04/21/16 0422 04/22/16 9562 04/22/16 1814 04/23/16 0505 04/24/16 0516 04/25/16 0445 04/26/16 0517  NA 136 133* 135  --  135  --   --  137  K 3.7 3.7 3.0* 3.1* 3.4* 3.2* 3.4* 3.4*  CL 106 104 104  --  103  --   --  103  CO2 21* 25 23  --  22  --   --  28  GLUCOSE 53* 79 73  --  69  --   --  86  BUN 11 6 5*  --  <5*  --   --  <5*  CREATININE 0.82 0.78 0.64  --  0.61  --   --  0.56  CALCIUM 7.7* 7.9* 7.5*  --  7.6*  --   --  7.7*  MG 1.8  --  1.5*  --  1.6* 1.6* 1.9 1.9  PHOS 2.9  --  3.7  --   --   --   --   --  Liver Function Tests: No results for input(s): AST, ALT, ALKPHOS, BILITOT, PROT, ALBUMIN in the last 168 hours. No results for input(s): LIPASE, AMYLASE in the last 168 hours. CBC:  Recent Labs Lab 04/20/16 0405 04/22/16 0453 04/25/16 0445 04/26/16 0517  WBC 12.9* 14.5* 15.7* 14.3*  NEUTROABS  --  11.7* 11.7* 10.6*  HGB 10.1* 9.1* 9.6* 9.6*  HCT 30.5* 27.2* 28.4* 28.3*  MCV 85.8 85.4 83.5 83.7  PLT 295 316 534* 556*   Cardiac Enzymes: No results for input(s): CKTOTAL, CKMB, CKMBINDEX, TROPONINI in the last 168 hours. CBG: No results for input(s): GLUCAP in the last 168 hours.  Recent Results (from the past 240 hour(s))  MRSA PCR Screening     Status: None   Collection Time: 04/18/16  5:04 AM  Result Value Ref Range Status   MRSA by PCR NEGATIVE NEGATIVE Final    Comment:        The GeneXpert MRSA Assay (FDA approved for NASAL specimens only), is one component of a comprehensive MRSA colonization surveillance program. It is  not intended to diagnose MRSA infection nor to guide or monitor treatment for MRSA infections.      Scheduled Meds: . clonazePAM  0.5 mg Oral TID  . diltiazem  180 mg Oral Daily  . enoxaparin (LOVENOX) injection  40 mg Subcutaneous Q24H  . gabapentin  400 mg Oral TID  . hydrOXYzine  50 mg Oral BID  . lamoTRIgine  150 mg Oral BID  . loratadine  10 mg Oral QPM  . mometasone-formoterol  2 puff Inhalation BID  . montelukast  10 mg Oral QHS  . nicotine  21 mg Transdermal Daily  . nortriptyline  100 mg Oral QHS  . piperacillin-tazobactam (ZOSYN)  IV  3.375 g Intravenous Q8H  . potassium chloride  10 mEq Intravenous Q1 Hr x 3  . rOPINIRole  2 mg Oral QHS   Continuous Infusions:    Assessment/Plan:  1. Chronic COPD.  no evidence of exasperation continue supportive care off oxygen therapy 2. Perforated sigmoid diverticulitis requiring surgical intervention.  Further therapy and treatment per surgery  3. Hypomagnesemia and hypokalemia. Low-dose potassium replacement  4. Anxiety and depression.   Continue oral psychiatric medications stable  5. Tobacco abuse. Nicotine patch. 6. Sinus tachycardia continue Cardizem CD 7. Fever, now resolved plan for oral antibiotics for a week for surgery   okayed her discharge to rehabilitation from medical standpoint  Code Status:     Code Status Orders        Start     Ordered   04/17/16 1349  Full code  Continuous     04/17/16 1348    Code Status History    Date Active Date Inactive Code Status Order ID Comments User Context   This patient has a current code status but no historical code status.     Disposition Plan: as per surgeon  Consultants:  Gen. surgery  Procedures:  Open sigmoid colectomy with colostomy  Antibiotics:  Zosyn  Time spent: 30 minutes. Allena KatzPATEL, [facility]  [employer]

## 2016-04-26 NOTE — Progress Notes (Signed)
Physical Therapy Treatment Patient Details Name: Sabrina Espinoza MRN: [id number] DOB: September 15, 1965 Today's Date: 04/26/2016    History of Present Illness Sabrina Espinoza is a 49yo white female who comes to [facility] on 10/12 with ABD pain, and later that day required colectomy, cyst drainage, apendectomy, and placement of 2 pelvic drainage tubes. At baseline, pt is fully independent, lives alone, without mobility restrictions or limitations. PMH: COPD.     PT Comments    Pt was seen for assessment of current mobility and she is much more lethargic today, suddenly less stable and reliant on assistance to walk.  Pt is unsteady around corners and requiring help to both get in and out of bed, in contrast to recent PT visit.  Will progress her acutely as tolerated to see if she can regain her functional level as previous visit.   Follow Up Recommendations  Other (comment);SNF (outpa)     Equipment Recommendations  None recommended by PT    Recommendations for Other Services       Precautions / Restrictions Precautions Precautions: Fall Precaution Comments: JP drains and IV Restrictions Weight Bearing Restrictions: No    Mobility  Bed Mobility Overal bed mobility: Needs Assistance Bed Mobility: Supine to Sit;Sit to Supine     Supine to sit: Min assist;Min guard Sit to supine: Min guard;Min assist   General bed mobility comments: using railing but lethargic and nursing reporting no pain meds given  Transfers Overall transfer level: Needs assistance Equipment used: 1 person hand held assist (IV pole) Transfers: Sit to/from [employer] Sit to Stand: Min guard Stand pivot transfers: Min guard       General transfer comment: requires initial standing steadying assist  Ambulation/Gait Ambulation/Gait assistance: Min guard;Min assist Ambulation Distance (Feet): 300 Feet Assistive device: 1 person hand held assist (IV pole) Gait Pattern/deviations: Step-through  pattern;Shuffle;Decreased stride length;Wide base of support;Drifts right/left Gait velocity: reduced Gait velocity interpretation: Below normal speed for age/gender General Gait Details: pulses and O2 sats were stable, sat 92 % pre and post gait   Stairs            Wheelchair Mobility    Modified Rankin (Stroke Patients Only)       Balance                                    Cognition Arousal/Alertness: Lethargic Behavior During Therapy: Flat affect Overall Cognitive Status: Within Functional Limits for tasks assessed                      Exercises      General Comments        Pertinent Vitals/Pain Pain Assessment: Faces Faces Pain Scale: Hurts little more Pain Location: abdomen Pain Descriptors / Indicators: Operative site guarding Pain Intervention(s): Monitored during session;Repositioned    Home Living                      Prior Function            PT Goals (current goals can now be found in the care plan section) Acute Rehab PT Goals Patient Stated Goal: to try to walk some Progress towards PT goals: Progressing toward goals    Frequency    Min 2X/week      PT Plan Discharge plan needs to be updated    Co-evaluation  End of Session Equipment Utilized During Treatment: Gait belt Activity Tolerance: Patient tolerated treatment well;Patient limited by pain;Patient limited by fatigue Patient left: in bed;with call bell/phone within reach;with bed alarm set (declined to sit EOB)     Time: 1000-1024 PT Time Calculation (min) (ACUTE ONLY): 24 min  Charges:  $Gait Training: 8-22 mins $Therapeutic Activity: 8-22 mins                    G Codes:      Sabrina Espinoza, Sabrina Espinoza 04/26/2016, 1:43 PM    Sabrina Espinoza, PT MS Acute Rehab Dept. Number: [facility] [id number] and [facility] [phone]

## 2016-04-27 LAB — BASIC METABOLIC PANEL
Anion gap: 7 (ref 5–15)
CHLORIDE: 103 mmol/L (ref 101–111)
CO2: 26 mmol/L (ref 22–32)
CREATININE: 0.57 mg/dL (ref 0.44–1.00)
Calcium: 7.8 mg/dL — ABNORMAL LOW (ref 8.9–10.3)
GFR calc Af Amer: >60 mL/min (ref >60)
GFR calc non Af Amer: >60 mL/min (ref >60)
Glucose, Bld: 87 mg/dL (ref 65–99)
Potassium: 3.5 mmol/L (ref 3.5–5.1)
Sodium: 136 mmol/L (ref 135–145)

## 2016-04-27 MED ORDER — AMOXICILLIN-POT CLAVULANATE 875-125 MG PO TABS
1.0000 | ORAL_TABLET | Freq: Two times a day (BID) | ORAL | Status: DC
  Administered 2016-04-27 – 2016-04-29 (×5): 1 via ORAL
  Filled 2016-04-27 (×5): qty 1

## 2016-04-27 NOTE — Clinical Social Work Note (Signed)
Clinical Social Work Assessment  Patient Details  Name: Sabrina Espinoza MRN: [id number] Date of Birth: 09-22-1965  Date of referral:  04/27/16               Reason for consult:  Facility Placement                Permission sought to share information with:  Facility Industrial/product designerContact Representative Permission granted to share information::  Yes, Verbal Permission Granted  Name::        Agency::     Relationship::     Contact Information:     Housing/Transportation Living arrangements for the past 2 months:  Single Family Home Source of Information:  Patient Patient Interpreter Needed:  None Criminal Activity/Legal Involvement Pertinent to Current Situation/Hospitalization:  No - Comment as needed Significant Relationships:  Parents Lives with:  Self Do you feel safe going back to the place where you live?  Yes Need for family participation in patient care:  No (Coment)  Care giving concerns:  STR   Social Worker assessment / plan:  The CSW visited patient at bedside to discuss dc planning. The patient was alert and oriented x4 (person, place, time, situation). The patient gave verbal permission to conduct bed search.  At baseline, the patient uses no DME, lives alone, and is independent in all ADLs and IADLs. The patient has a good relationship with her mother, but she does not want her mother to be consulted about her dc plans. The patient indicated that she is willing to try a SNF for STR, but she may change her mind to HHPT as dc approaches. The CSW advised her of the difference is service level and the need for STR. The patient verbalized understanding.  The patient reported no particular preference for a SNF.  Employment status:  Retired Health and safety inspectornsurance information:  [employer] PT Recommendations:  Skilled Nursing Facility Information / Referral to community resources:  Skilled Nursing Facility  Patient/Family's Response to care:  The patient thanked the CSW for answering questions and  assisting in dc planning.  Patient/Family's Understanding of and Emotional Response to Diagnosis, Current Treatment, and Prognosis:  The patient has limited insight into her needs and may need further explanation as dc approaches.  Emotional Assessment Appearance:  Appears older than stated age Attitude/Demeanor/Rapport:   (Pleasant) Affect (typically observed):  Appropriate, Pleasant Orientation:  Oriented to Self, Oriented to Place, Oriented to  Time, Oriented to Situation Alcohol / Substance use:  Never Used Psych involvement (Current and /or in the community):  No (Comment)  Discharge Needs  Concerns to be addressed:  Discharge Planning Concerns Readmission within the last 30 days:  No Current discharge risk:  Lives alone Barriers to Discharge:  Continued Medical Work up   [employer] CorporationKaren M Guynell Kleiber, LCSW 04/27/2016, 1:58 PM

## 2016-04-27 NOTE — Progress Notes (Signed)
RN called to room with complaint of "drain hanging out". Upon entering room, penrose drain found laying on counter and patient resting comfortably in bed. Visitor at bedside. Dr. Excell Seltzerooper notified that penrose drain "fell out per patient". MD order to apply wet to dry dressing and change Q 8 hrs.

## 2016-04-27 NOTE — Care Management Important Message (Signed)
Important Message  Patient Details  Name: Sabrina Espinoza MRN: [id number] Date of Birth: 1965/10/03   Medicare Important Message Given:  Yes    Lawerance Sabalebbie Shaquan Missey, RN 04/27/2016, 9:06 AM

## 2016-04-27 NOTE — Progress Notes (Signed)
Patient ID: Sabrina Espinoza, female   DOB: 01-Jul-1966, 50 y.o.   MRN: [id number]   [facility] PROGRESS NOTE  Sabrina Espinoza [id number] DOB: 01-Jul-1966 DOA: 04/17/2016 PCP: Domenic SchwabLindley,Cheryl Paulette, FNP  HPI/Subjective: Has pain in the abdomen intermittently denies any nausea or vomiting           Objective: Vitals:   04/26/16 2058 04/27/16 0514  BP: 110/73 (!) 99/59  Pulse: 93 91  Resp: 18 18  Temp: 99.8 F (37.7 C) 98.6 F (37 C)    Filed Weights   04/17/16 0458 04/18/16 0026 04/20/16 0431  Weight: 182 lb (82.6 kg) 203 lb 0.7 oz (92.1 kg) 212 lb 3.2 oz (96.3 kg)    ROS: Review of Systems  Constitutional: Negative for chills and fever.  Eyes: Negative for blurred vision.  Respiratory: Negative for cough and shortness of breath.   Cardiovascular: Negative for chest pain.  Gastrointestinal: Positive for abdominal pain. Negative for constipation, diarrhea, nausea and vomiting.  Musculoskeletal: Negative for joint pain.  Neurological: Negative for dizziness and headaches.   Exam: Physical Exam  Constitutional: She is oriented to person, place, and time.  HENT:  Nose: No mucosal edema.  Mouth/Throat: No oropharyngeal exudate or posterior oropharyngeal edema.  Eyes: Conjunctivae, EOM and lids are normal. Pupils are equal, round, and reactive to light.  Neck: No JVD present. Carotid bruit is not present. No edema present. No thyroid mass and no thyromegaly present.  Cardiovascular: Regular rhythm, S1 normal and S2 normal.  Exam reveals no gallop.   No murmur heard. Pulses:      Dorsalis pedis pulses are 2+ on the right side, and 2+ on the left side.  Respiratory: No respiratory distress. She has no decreased breath sounds. She has no wheezes. She has no rhonchi. She has no rales.  GI: Soft. Bowel sounds are normal. She exhibits distension. There is no tenderness.  Musculoskeletal:       Right shoulder: She exhibits no swelling.       Right ankle: She  exhibits swelling.       Left ankle: She exhibits swelling.  Lymphadenopathy:    She has no cervical adenopathy.  Neurological: She is alert and oriented to person, place, and time. No cranial nerve deficit.  Skin: Skin is warm. No rash noted. Nails show no clubbing.  Psychiatric: She has a normal mood and affect.      Data Reviewed: Basic Metabolic Panel:  Recent Labs Lab 04/21/16 0422 04/22/16 [id number]  04/23/16 0505 04/24/16 0516 04/25/16 0445 04/26/16 0517 04/27/16 0602  NA 133* 135  --  135  --   --  137 136  K 3.7 3.0*  < > 3.4* 3.2* 3.4* 3.4* 3.5  CL 104 104  --  103  --   --  103 103  CO2 25 23  --  22  --   --  28 26  GLUCOSE 79 73  --  69  --   --  86 87  BUN 6 5*  --  <5*  --   --  <5* <5*  CREATININE 0.78 0.64  --  0.61  --   --  0.56 0.57  CALCIUM 7.9* 7.5*  --  7.6*  --   --  7.7* 7.8*  MG  --  1.5*  --  1.6* 1.6* 1.9 1.9  --   PHOS  --  3.7  --   --   --   --   --   --   < > =  values in this interval not displayed. Liver Function Tests: No results for input(s): AST, ALT, ALKPHOS, BILITOT, PROT, ALBUMIN in the last 168 hours. No results for input(s): LIPASE, AMYLASE in the last 168 hours. CBC:  Recent Labs Lab 04/22/16 0453 04/25/16 0445 04/26/16 0517  WBC 14.5* 15.7* 14.3*  NEUTROABS 11.7* 11.7* 10.6*  HGB 9.1* 9.6* 9.6*  HCT 27.2* 28.4* 28.3*  MCV 85.4 83.5 83.7  PLT 316 534* 556*   Cardiac Enzymes: No results for input(s): CKTOTAL, CKMB, CKMBINDEX, TROPONINI in the last 168 hours. CBG: No results for input(s): GLUCAP in the last 168 hours.  Recent Results (from the past 240 hour(s))  MRSA PCR Screening     Status: None   Collection Time: 04/18/16  5:04 AM  Result Value Ref Range Status   MRSA by PCR NEGATIVE NEGATIVE Final    Comment:        The GeneXpert MRSA Assay (FDA approved for NASAL specimens only), is one component of a comprehensive MRSA colonization surveillance program. It is not intended to diagnose MRSA infection nor to  guide or monitor treatment for MRSA infections.      Scheduled Meds: . amoxicillin-clavulanate  1 tablet Oral Q12H  . clonazePAM  0.5 mg Oral TID  . diltiazem  180 mg Oral Daily  . enoxaparin (LOVENOX) injection  40 mg Subcutaneous Q24H  . gabapentin  400 mg Oral TID  . hydrOXYzine  50 mg Oral BID  . lamoTRIgine  150 mg Oral BID  . loratadine  10 mg Oral QPM  . mometasone-formoterol  2 puff Inhalation BID  . montelukast  10 mg Oral QHS  . nicotine  21 mg Transdermal Daily  . nortriptyline  100 mg Oral QHS  . rOPINIRole  2 mg Oral QHS   Continuous Infusions:    Assessment/Plan:  1. Chronic COPD.  no evidence of exasperation continue supportive care off oxygen therapy 2. Perforated sigmoid diverticulitis requiring surgical intervention.  Further therapy and treatment per surgery  3. Hypomagnesemia and hypokalemia. Being replaced 4. Anxiety and depression.   Continue oral psychiatric medications stable  5. Tobacco abuse. Nicotine patch. 6. Sinus tachycardia continue Cardizem CD 7. Fever, now resolved plan for oral antibiotics for a week for surgery   Call with any questions patient stable to be discharged to rehabilitation will sign off   Code Status:     Code Status Orders        Start     Ordered   04/17/16 1349  Full code  Continuous     04/17/16 1348    Code Status History    Date Active Date Inactive Code Status Order ID Comments User Context   This patient has a current code status but no historical code status.     Disposition Plan: as per surgeon  Consultants:  Gen. surgery  Procedures:  Open sigmoid colectomy with colostomy  Antibiotics:  Zosyn  Time spent: 25 minutes. Allena Katz [facility]  [facility]

## 2016-04-27 NOTE — Progress Notes (Signed)
10 Days Post-Op  Subjective: Patient is status post Hartman's procedure she is feeling much better and gaining her strength back little by little. She is tolerating a regular diet and her ostomy is functional.  Objective: Vital signs in last 24 hours: Temp:  [98.3 F (36.8 C)-99.8 F (37.7 C)] 98.6 F (37 C) (10/22 0514) Pulse Rate:  [87-93] 91 (10/22 0514) Resp:  [17-18] 18 (10/22 0514) BP: (99-110)/(59-73) 99/59 (10/22 0514) SpO2:  [94 %-96 %] 94 % (10/22 0514) Last BM Date: 04/26/16  Intake/Output from previous day: 10/21 0701 - 10/22 0700 In: 1171 [P.O.:720; IV Piggyback:391] Out: 1800 [Urine:1600; Stool:200] Intake/Output this shift: No intake/output data recorded.  Physical exam:  MAXIMUM TEMPERATURE of 99.8 last night Other vital signs are stable Abdomen is soft nondistended nontender wound shows granulation tissue with minimal fibber no purulent material at each and where Penrose drain is present. Drains are present and serosanguineous only Calves are nontender  Lab Results: CBC   Recent Labs  04/25/16 0445 04/26/16 0517  WBC 15.7* 14.3*  HGB 9.6* 9.6*  HCT 28.4* 28.3*  PLT 534* 556*   BMET  Recent Labs  04/26/16 0517 04/27/16 0602  NA 137 136  K 3.4* 3.5  CL 103 103  CO2 28 26  GLUCOSE 86 87  BUN <5* <5*  CREATININE 0.56 0.57  CALCIUM 7.7* 7.8*   PT/INR No results for input(s): LABPROT, INR in the last 72 hours. ABG No results for input(s): PHART, HCO3 in the last 72 hours.  Invalid input(s): PCO2, PO2  Studies/Results: Ct Abdomen Pelvis W Contrast  Result Date: 04/25/2016 CLINICAL DATA:  Abdominal pain, status post sigmoid colectomy, pelvic abscess drainage EXAM: CT ABDOMEN AND PELVIS WITH CONTRAST TECHNIQUE: Multidetector CT imaging of the abdomen and pelvis was performed using the standard protocol following bolus administration of intravenous contrast. CONTRAST:  ISOVUE-300 IOPAMIDOL (ISOVUE-300) INJECTION 61% COMPARISON:   04/17/2016 FINDINGS: Lower chest: Lung bases shows bilateral small pleural effusion. Bilateral lower lobe posterior atelectasis or infiltrate. Hepatobiliary: There is fatty infiltration of the liver. No focal hepatic mass. Pancreas: Enhanced pancreas is unremarkable. Spleen: Enhanced spleen is unremarkable. Adrenals/Urinary Tract: No adrenal gland mass. Enhanced kidneys are symmetrical in size. No hydronephrosis or hydroureter. Punctate nonobstructive calcified calculus upper pole of the left kidney measures 2 mm. Delayed renal images shows bilateral renal symmetrical excretion. Bilateral visualized proximal ureter is unremarkable. No calcified calculi are noted within urinary bladder. No thickening of urinary bladder wall. Stomach/Bowel: Small hiatal hernia is noted. No gastric outlet obstruction. No small bowel obstruction. Mild distended small bowel loops with some air-fluid levels in lower abdomen probable mild ileus. No transition point in caliber of small bowel to suggest small bowel obstruction. No pericecal inflammation. The patient is status post appendectomy. The terminal ileum is unremarkable. Moderate colonic stool are noted within transverse colon. Moderate colonic stool noted within splenic flexure of the colon and left colon. The patient is status post sigmoidectomy there is a colostomy in left lower quadrant of the abdomen. Minimal stranding of subcutaneous fat surrounding the colostomy probable mild edema or post surgical changes. There is a midline abdominal wall scar. There are skin staples upper and mid aspect of the scar. Post surgical material noted within subcutaneous fat along the scar. Small amount of air is noted along the scar. Please note there is partial anterior opening of lower aspect of the scar. No definite abscess or fluid collection is noted along the surgical scar. Two postsurgical drains are noted  within pelvis. There is no evidence of pelvic abscess. Small residual stool noted  within distal sigmoid colon and rectum remnant. Vascular/Lymphatic: No aortic aneurysm. No retroperitoneal or mesenteric adenopathy. Atherosclerotic calcifications of abdominal aorta and iliac arteries. Reproductive: The uterus is atrophic. The uterus is anteflexed. No adnexal masses noted. Other: Mild anasarca infiltration of subcutaneous fat bilateral flank wall. There is no evidence of free abdominal or pelvic air. No abdominal ascites. Trace ascites noted posterior cul-de-sac. Musculoskeletal: No destructive bony lesions are noted. Mild degenerative changes lower thoracic and lumbar spine. IMPRESSION: 1. Mild fatty infiltration of the liver. Left nonobstructive nephrolithiasis. No hydronephrosis or hydroureter. 2. Status post sigmoidectomy. There is a colostomy in left lower quadrant. Small amount of fluid and mild stranding of subcutaneous fat adjacent to colostomy most likely edema or postsurgical in nature. No definite evidence of abscess. 3. There is a midline abdominal wall scar. There are skin staples upper and mid aspect of the scar. Post surgical material noted within subcutaneous fat along the scar. Small amount of air is noted along the scar. Please note there is partial anterior opening of lower aspect of the scar. No definite abscess or fluid collection is noted along the surgical scar. 4. Postsurgical drains are noted within pelvis. No evidence of pelvic abscess. 5. Moderate stool within colon without definite evidence of colonic obstruction. No evidence of acute colitis. Mild small bowel distension with some air-fluid levels probable mild ileus. 6. Mild anasarca infiltration subcutaneous fat bilateral flank. 7. No hydronephrosis or hydroureter. Electronically Signed   By: Natasha MeadLiviu  Pop M.D.   On: 04/25/2016 14:44    Anti-infectives: Anti-infectives    Start     Dose/Rate Route Frequency Ordered Stop   04/17/16 1730  piperacillin-tazobactam (ZOSYN) IVPB 3.375 g     3.375 g 12.5 mL/hr over 240  Minutes Intravenous Every 8 hours 04/17/16 1348     04/17/16 0915  piperacillin-tazobactam (ZOSYN) IVPB 3.375 g     3.375 g 12.5 mL/hr over 240 Minutes Intravenous  Once 04/17/16 0908 04/17/16 1036      Assessment/Plan: s/p Procedure(s): EXPLORATORY LAPAROTOMY COLOSTOMY CREATION/HARTMANN PROCEDURE APPENDECTOMY   Patient is doing quite well and social services seen the patient for potential transfer to a rehabilitation facility. The F L2 will be signed today anticipate transfer tomorrow if bed is available. This is discussed with the patient and she was in agreement. Would switch to oral antibiotics at this point in anticipation of transfer.  Lattie Hawichard E Cooper, MD, FACS  04/27/2016

## 2016-04-28 ENCOUNTER — Encounter: Payer: Self-pay | Admitting: Student

## 2016-04-28 LAB — CBC WITH DIFFERENTIAL/PLATELET
BASOS ABS: 0.1 10*3/uL (ref 0–0.1)
Basophils Relative: 1 %
EOS ABS: 0.7 10*3/uL (ref 0–0.7)
Eosinophils Relative: 5 %
HCT: 28.9 % — ABNORMAL LOW (ref 35.0–47.0)
Hemoglobin: 9.8 g/dL — ABNORMAL LOW (ref 12.0–16.0)
LYMPHS ABS: 2.6 10*3/uL (ref 1.0–3.6)
LYMPHS PCT: 19 %
MCH: 28.4 pg (ref 26.0–34.0)
MCHC: 34 g/dL (ref 32.0–36.0)
MCV: 83.5 fL (ref 80.0–100.0)
Monocytes Absolute: 1.4 10*3/uL — ABNORMAL HIGH (ref 0.2–0.9)
Monocytes Relative: 10 %
NEUTROS ABS: 8.9 10*3/uL — AB (ref 1.4–6.5)
Neutrophils Relative %: 65 %
PLATELETS: 684 10*3/uL — AB (ref 150–440)
RBC: 3.46 MIL/uL — AB (ref 3.80–5.20)
RDW: 16.1 % — AB (ref 11.5–14.5)
Smear Review: ADEQUATE
WBC: 13.7 10*3/uL — AB (ref 3.6–11.0)

## 2016-04-28 MED ORDER — ENSURE ENLIVE PO LIQD
237.0000 mL | Freq: Two times a day (BID) | ORAL | Status: DC
  Administered 2016-04-29: 237 mL via ORAL

## 2016-04-28 NOTE — Progress Notes (Signed)
Pt was alert and sitting up in bed. Pt responded well to questions and spoke about her parents and son (both estranged relationships) and about two good dear friends. Attends worship and weekly [employer] and makes use of devotional books (at bedside). At end of conversation CH offered prayer.   04/28/16 1100  Clinical Encounter Type  Visited With Patient  Visit Type Initial;Spiritual support  Spiritual Encounters  Spiritual Needs Prayer;Emotional  Stress Factors  Patient Stress Factors None identified

## 2016-04-28 NOTE — Progress Notes (Signed)
Physical Therapy Treatment Patient Details Name: Sabrina Espinoza MRN: [id number] DOB: 10-10-1965 Today's Date: 04/28/2016    History of Present Illness Sabrina Espinoza is a 49yo white female who [facility] with ABD pain, and later that day required colectomy, cyst drainage, apendectomy, and placement of 2 pelvic drainage tubes. At baseline, pt is fully independent, lives alone, without mobility restrictions or limitations. PMH: COPD.     PT Comments    Ms. Pricilla Holmucker is making progress.  She ambulated 300 ft without AD; however demonstrates min instability and is limited by pain and fatigue.  Encouraged pt to ambulate with nursing staff at least 2 more times today.  Due to pt's instability and risk for falling, SNF remains most appropriate recommendation for d/c.     Follow Up Recommendations  SNF     Equipment Recommendations  None recommended by PT    Recommendations for Other Services       Precautions / Restrictions Precautions Precautions: Fall Precaution Comments: JP drain Restrictions Weight Bearing Restrictions: No    Mobility  Bed Mobility Overal bed mobility: Needs Assistance Bed Mobility: Rolling;Sidelying to Sit Rolling: Min guard Sidelying to sit: Min guard       General bed mobility comments: Cues for log roll to protect abdomen.  Pt requires increased time and effort and use of bed rail.  Transfers Overall transfer level: Needs assistance Equipment used: None Transfers: Sit to/from Stand Sit to Stand: Min guard         General transfer comment: Min guard as pt unsteady but no LOB  Ambulation/Gait Ambulation/Gait assistance: Min guard Ambulation Distance (Feet): 300 Feet Assistive device: None Gait Pattern/deviations: Step-through pattern;Decreased stride length;Drifts right/left Gait velocity: decreased Gait velocity interpretation: Below normal speed for age/gender General Gait Details: Pt unsteady but with no LOB.  Min guard for  safety due to instability and pt limited by pain.   Stairs            Wheelchair Mobility    Modified Rankin (Stroke Patients Only)       Balance Overall balance assessment: Needs assistance Sitting-balance support: No upper extremity supported;Feet supported Sitting balance-Leahy Scale: Good     Standing balance support: No upper extremity supported;During functional activity Standing balance-Leahy Scale: Fair Standing balance comment: Instability but no LOB             High level balance activites: Side stepping;Backward walking;Direction changes;Turns;Sudden stops;Head turns High Level Balance Comments: Min instability but no LOB with high level balance activities    Cognition Arousal/Alertness: Awake/alert Behavior During Therapy: Flat affect Overall Cognitive Status: Within Functional Limits for tasks assessed                      Exercises General Exercises - Upper Extremity Shoulder Flexion: AROM;Both;10 reps;Seated General Exercises - Lower Extremity Ankle Circles/Pumps: AROM;Both;10 reps;Seated    General Comments        Pertinent Vitals/Pain Pain Assessment: 0-10 Pain Score: 9  Pain Location: abdomen Pain Descriptors / Indicators: Aching;Sore Pain Intervention(s): Limited activity within patient's tolerance;Monitored during session;Repositioned    Home Living                      Prior Function            PT Goals (current goals can now be found in the care plan section) Acute Rehab PT Goals Patient Stated Goal: decreased pain PT Goal Formulation: With patient Time For  Goal Achievement: 05/04/16 Potential to Achieve Goals: Good Progress towards PT goals: Progressing toward goals    Frequency    Min 2X/week      PT Plan Current plan remains appropriate    Co-evaluation             End of Session Equipment Utilized During Treatment: Gait belt Activity Tolerance: Patient tolerated treatment well;Patient  limited by fatigue;Patient limited by pain Patient left: with call bell/phone within reach;in chair;with chair alarm set     Time: 1610-9604 PT Time Calculation (min) (ACUTE ONLY): 25 min  Charges:  $Gait Training: 23-37 mins                    G Codes:       Encarnacion Chu PT, DPT 04/28/2016, 2:32 PM

## 2016-04-28 NOTE — Progress Notes (Signed)
Patiet pulled JP drain  Ou;t 30 ml of clear yellow secretions observed; Surgical MD notified; no new orders; dressings changed as ordered and dry and intact; patient denies pain or distress at this time. Will continue to monitor

## 2016-04-28 NOTE — Clinical Social Work Note (Signed)
CSW spoke with patient who was alert and oriented X4 but had slurred speech and was closing her eyes frequently. Patient stated that she still would like to go to rehab for her new ostomy teaching. Patient has had bed offers from [facility] and from [facility]. Patient has chosen [facility]. Bed offer accepted. Patient informed that she will need to have someone transport her tomorrow when time as she does not need to go via EMS. York SpanielMonica Makinlee Awwad MSW,LCSW [phone]

## 2016-04-28 NOTE — Progress Notes (Signed)
04/28/2016  Subjective: Patient is 11 Days Post-Op status post Hartman's procedure with end colostomy. Currently she is doing well. Overnight one of her JP drains was pulled accidentally by she denies any pain related to that. She tolerating a diet and has ostomy function.  Vital signs: Temp:  [97.6 F (36.4 C)-98.6 F (37 C)] 98.1 F (36.7 C) (10/23 0900) Pulse Rate:  [50-96] 96 (10/23 0900) Resp:  [20] 20 (10/23 0438) BP: (102-124)/(58-82) 106/76 (10/23 0900) SpO2:  [81 %-96 %] 95 % (10/23 0900)   Intake/Output: 10/22 0701 - 10/23 0700 In: 660 [P.O.:660] Out: 230 [Drains:30; Stool:200] Last BM Date: 04/26/16  Physical Exam: Constitutional: No acute distress Abdomen: Soft, nondistended, appropriately tender. Left lower quadrant ostomy with viable mucosa and stool in the bag. Midline incision is healing with staples. With mild serous drainage in between staples. One remaining JP drain with serous fluid.  Labs:   Recent Labs  04/26/16 0517 04/28/16 0542  WBC 14.3* 13.7*  HGB 9.6* 9.8*  HCT 28.3* 28.9*  PLT 556* 684*    Recent Labs  04/26/16 0517 04/27/16 0602  NA 137 136  K 3.4* 3.5  CL 103 103  CO2 28 26  GLUCOSE 86 87  BUN <5* <5*  CREATININE 0.56 0.57  CALCIUM 7.7* 7.8*   No results for input(s): LABPROT, INR in the last 72 hours.  Imaging: No results found.  Assessment/Plan: [name] status post Hartman's procedure for perforated diverticulitis. Currently we're working on bed placement for her for further teaching on ostomy function and wound checks. She still has 1 JP drain which is draining serous fluid. She has been ambulating and is tolerating a diet with good ostomy function. Once we have bed placement she should be ready for discharge soon. She will require 2 more days still of antibiotics.   Sabrina IllJose Luis Daimien Patmon, MD [facility]

## 2016-04-28 NOTE — Progress Notes (Signed)
Initial Nutrition Assessment  DOCUMENTATION CODES:   Not applicable  INTERVENTION:  -Ensure Enlive po BID, each supplement provides 350 kcal and 20 grams of protein  NUTRITION DIAGNOSIS:   Increased nutrient needs related to wound healing as evidenced by estimated needs.  GOAL:   Patient will meet greater than or equal to 90% of their needs  MONITOR:   PO intake, I & O's, Labs, Weight trends, Supplement acceptance  REASON FOR ASSESSMENT:   LOS    ASSESSMENT:   Sabrina Espinoza is a 50 y.o. female presents to the ER today for evaluation of abdominal pain. Patient reports that she's had abdominal pain for approximately the last month however started getting worse 3 days ago which prompted her to come to the emergency department today.   Sabrina Espinoza is transferring to [facility] tomorrow. She is s/p Haartman's Procedure with an End Colostomy and JHP Drain. She was in a considerable amount of pain where her incision was during my visit, made RN aware. She states she hasn't eaten anything today related to her pain, but she states she was eating well PTA 3 meals per day, doesn't know of weight loss.  Per chart she has gained weight but unsure of accuracy. Wt was stable for 8 months until then. Documented meal completion: 50-100% over the past 2 meals. She states she drank Ensure when she was pregnant, but has not had during stay, will provide. Nutrition-Focused physical exam completed. Findings are moderate-severe fat depletion, no muscle depletion, and no edema.  She had significant ostomy output in bag during my visit. Labs and medications reviewed  Diet Order:  Diet regular Room service appropriate? Yes; Fluid consistency: Thin  Skin:  Wound (see comment) (Surgical wound to abdomen)  Last BM:  10/21  Height:   Ht Readings from Last 1 Encounters:  04/18/16 5\' 9"  (1.753 m)    Weight:   Wt Readings from Last 1 Encounters:  04/20/16 212 lb 3.2 oz (96.3 kg)     Ideal Body Weight:  65.9 kg  BMI:  Body mass index is 31.34 kg/m.  Estimated Nutritional Needs:   Kcal:  1800-2200 calories  Protein:  96-115 gm  Fluid:  >/= 1.8L  EDUCATION NEEDS:   No education needs identified at this time  Dionne AnoWilliam M. Priscille Shadduck, MS, RD LDN Inpatient Clinical Dietitian Pager [phone]

## 2016-04-29 DIAGNOSIS — K5720 Diverticulitis of large intestine with perforation and abscess without bleeding: Secondary | ICD-10-CM

## 2016-04-29 LAB — BASIC METABOLIC PANEL
Anion gap: 7 (ref 5–15)
BUN: <5 mg/dL — ABNORMAL LOW (ref 6–20)
CALCIUM: 8.6 mg/dL — AB (ref 8.9–10.3)
CO2: 28 mmol/L (ref 22–32)
CREATININE: 0.6 mg/dL (ref 0.44–1.00)
Chloride: 101 mmol/L (ref 101–111)
Glucose, Bld: 102 mg/dL — ABNORMAL HIGH (ref 65–99)
Potassium: 4 mmol/L (ref 3.5–5.1)
SODIUM: 136 mmol/L (ref 135–145)

## 2016-04-29 MED ORDER — ACETAMINOPHEN 325 MG PO TABS: 650.0000 mg | ORAL_TABLET | Freq: Four times a day (QID) | ORAL | 0 refills | Status: DC | PRN

## 2016-04-29 MED ORDER — IBUPROFEN 600 MG PO TABS: 600.0000 mg | ORAL_TABLET | Freq: Three times a day (TID) | ORAL | 0 refills | Status: DC | PRN

## 2016-04-29 MED ORDER — AMOXICILLIN-POT CLAVULANATE 875-125 MG PO TABS: 1.0000 | ORAL_TABLET | Freq: Two times a day (BID) | ORAL | 0 refills | Status: AC

## 2016-04-29 MED ORDER — DILTIAZEM HCL ER COATED BEADS 180 MG PO CP24: 180.0000 mg | ORAL_CAPSULE | Freq: Every day | ORAL | 1 refills | Status: DC

## 2016-04-29 MED ORDER — OXYCODONE HCL 10 MG PO TABS: 10.0000 mg | ORAL_TABLET | ORAL | 0 refills | Status: DC | PRN

## 2016-04-29 MED ORDER — NICOTINE 21 MG/24HR TD PT24: 21.0000 mg | MEDICATED_PATCH | Freq: Every day | TRANSDERMAL | 0 refills | Status: DC

## 2016-04-29 NOTE — Clinical Social Work Note (Signed)
Patient to discharge today to [facility]. Patient to arrange for someone to transport her. Discharge information sent and nurse to call report. York SpanielMonica Lehman Whiteley MSW,LCSW [phone]

## 2016-04-29 NOTE — Discharge Summary (Signed)
Patient ID: Alfonse Rasudrey E Round MRN: [id number] DOB/AGE: 06-Apr-1966 50 y.o.  Admit date: 04/17/2016 Discharge date: 04/29/2016   Discharge Diagnoses:  Active Problems:   Diverticulitis large intestine   Diverticulitis of colon with perforation   Procedures:   1.  Hartmann's procedure with end colostomy, appendectomy, and drainage of pelvic abscess 2.  Delayed primary closure of midline incision with staples  Hospital Course: The patient was admitted on 10/12 with perforated diverticulitis. Initially the plan was for conservative management with IV antibiotics and IV fluids and bowel rest. However later that night she her status deteriorated and she required to go to the operating room and went a Hartman's procedure with appendectomy and drainage of pelvic abscess. Postoperatively her midline incision underwent delayed primary closure on postop day 3. She had 2 drains in place as well as a Penrose drain the midline incision for drainage. These were draining serosanguineous fluid. Her bowel function was slow to return and she was slowly advancing her diet. She initially had a PCA for pain control and this was eventually transitioned to oral pain medication. On postop day 7 she did have a low-grade fever of 100.4. Her white blood cell count was also slowly increasing and a CT scan of the abdomen and pelvis was obtained on 10/20 which only showed postoperative changes but no abscesses. Her white blood cell count subsequent to that started improving. She has been on antibiotic course since the day of admission and has been transitioned to oral Augmentin. She has 2 more days left of her antibiotic course. Her JP drains have been discontinued and her Penrose drain has been removed as well. Otherwise she has been ambulating and her pain is controlled. She has been having difficulty with ostomy education and her wound has been having serous drainage which requires dressing changes on a daily basis. After  consulting with social work and physical therapy was deemed that the patient could go to a skilled nursing facility for further care. Will be discharged on 10/24 in good condition.  Consults: Internal medicine  Disposition: Skilled nursing facility  Discharge Instructions    Call MD for:  difficulty breathing, headache or visual disturbances    Complete by:  As directed    Call MD for:  persistant nausea and vomiting    Complete by:  As directed    Call MD for:  redness, tenderness, or signs of infection (pain, swelling, redness, odor or green/yellow discharge around incision site)    Complete by:  As directed    Call MD for:  severe uncontrolled pain    Complete by:  As directed    Call MD for:  temperature >100.4    Complete by:  As directed    Change dressing (specify)    Complete by:  As directed    Wet-to-dry dressing change to midline incision twice daily.   Diet - low sodium heart healthy    Complete by:  As directed    Discharge instructions    Complete by:  As directed    Patient may shower but do not submerge wound in pool or tub. Do not scrub heavily and dabbed dry only. Do not remove staples.   Driving Restrictions    Complete by:  As directed    Do not drive for taking narcotics for pain control.   Increase activity slowly    Complete by:  As directed    Lifting restrictions    Complete by:  As directed  No heavy lifting of more than 10 pounds for 4 additional weeks.       Medication List    STOP taking these medications   ciprofloxacin 250 MG tablet Commonly known as:  CIPRO   nitrofurantoin (macrocrystal-monohydrate) 100 MG capsule Commonly known as:  MACROBID   ondansetron 4 MG tablet Commonly known as:  ZOFRAN   oxyCODONE-acetaminophen 5-325 MG tablet Commonly known as:  PERCOCET/ROXICET   promethazine 25 MG tablet Commonly known as:  PHENERGAN     TAKE these medications   acetaminophen 325 MG tablet Commonly known as:  TYLENOL Take 2  tablets (650 mg total) by mouth every 6 (six) hours as needed for mild pain or fever.   amoxicillin-clavulanate 875-125 MG tablet Commonly known as:  AUGMENTIN Take 1 tablet by mouth every 12 (twelve) hours.   azelastine 0.1 % nasal spray Commonly known as:  ASTELIN Place 1 spray into the nose 2 (two) times daily. Use in each nostril as directed   budesonide-formoterol 160-4.5 MCG/ACT inhaler Commonly known as:  SYMBICORT Inhale 2 puffs into the lungs 2 (two) times daily. USE 2 PUFFS 2 TIMES A DAY   butalbital-acetaminophen-caffeine 50-325-40 MG tablet Commonly known as:  FIORICET, ESGIC Take 2 tablets by mouth every 6 (six) hours as needed for headache.   Calcium Carbonate-Vitamin D3 600-400 MG-UNIT Tabs Take 1 tablet by mouth 2 (two) times daily.   clonazePAM 1 MG tablet Commonly known as:  KLONOPIN Take 1 mg by mouth 3 (three) times daily.   diltiazem 180 MG 24 hr capsule Commonly known as:  CARDIZEM CD Take 1 capsule (180 mg total) by mouth daily. Start taking on:  04/30/2016   EPIPEN 2-PAK 0.3 mg/0.3 mL Soaj injection Generic drug:  EPINEPHrine Inject 0.3 mg into the muscle once.   fluticasone 50 MCG/ACT nasal spray Commonly known as:  FLONASE Place 1 spray into both nostrils 2 (two) times daily.   gabapentin 400 MG capsule Commonly known as:  NEURONTIN Take 400 mg by mouth 3 (three) times daily.   hydrOXYzine 50 MG capsule Commonly known as:  VISTARIL Take 50 mg by mouth 2 (two) times daily.   ibuprofen 600 MG tablet Commonly known as:  ADVIL,MOTRIN Take 1 tablet (600 mg total) by mouth every 8 (eight) hours as needed for fever or mild pain.   lamoTRIgine 150 MG tablet Commonly known as:  LAMICTAL Take 300 mg by mouth daily.   levocetirizine 5 MG tablet Commonly known as:  XYZAL Take 1 tablet (5 mg total) by mouth every evening.   montelukast 10 MG tablet Commonly known as:  SINGULAIR TAKE 1 TABLET (10 MG TOTAL) BY MOUTH AT BEDTIME.   nicotine 21  mg/24hr patch Commonly known as:  NICODERM CQ - dosed in mg/24 hours Place 1 patch (21 mg total) onto the skin daily. Start taking on:  04/30/2016   nortriptyline 50 MG capsule Commonly known as:  PAMELOR Take 100 mg by mouth at bedtime.   Oxycodone HCl 10 MG Tabs Take 1 tablet (10 mg total) by mouth every 4 (four) hours as needed for moderate pain or breakthrough pain.   pantoprazole 40 MG tablet Commonly known as:  PROTONIX Take 1 tablet (40 mg total) by mouth daily as needed. What changed:  when to take this   polyethylene glycol powder powder Commonly known as:  GLYCOLAX/MIRALAX Take 17 g by mouth daily as needed for mild constipation.   prazosin 5 MG capsule Commonly known as:  MINIPRESS Take 5  mg by mouth at bedtime.   rOPINIRole 2 MG tablet Commonly known as:  REQUIP Take 2 mg by mouth 2 (two) times daily.   VENTOLIN HFA 108 (90 Base) MCG/ACT inhaler Generic drug:  albuterol Inhale 2 puffs into the lungs every 4 (four) hours as needed for wheezing or shortness of breath. What changed:  how much to take  when to take this   zaleplon 10 MG capsule Commonly known as:  SONATA Take 10 mg by mouth at bedtime.       Contact information for follow-up providers    Lindley,Cheryl Paulette, FNP Follow up in 1 week(s).   Specialty:  Family Medicine Why:  f/u for overall health maintenance and medication management. Contact information: [address] [place] [address] [phone]        Leafy Ro, MD Follow up in 1 week(s).   Specialty:  General Surgery Why:  post-op Hartmann's procedure Contact information: [address] [place] [address] [phone]            Contact information for after-discharge care    Destination    [facility] .   Specialty:  Skilled Nursing Chief of Staff information: [address] [place] [address] [phone]

## 2016-04-29 NOTE — Progress Notes (Signed)
Pt d/c to home today.  JP removed intact per order.  IV removed intact.  Rx's given to pt w/all questions and concerns addressed.  D/C paperwork reviewed and education provided with all questions and concerns addressed.  Pt friend at bedside for home transport.  Report called to [facility].

## 2016-04-29 NOTE — Progress Notes (Signed)
MEDICATION RELATED CONSULT NOTE - INITIAL   Pharmacy Consulted to assist in electrolyte replacement and monitoring Indication: hypomagnesemia, hypokalemia  No Known Allergies  Patient Measurements: Height: 5\' 9"  (175.3 cm) Weight: 212 lb 3.2 oz (96.3 kg) IBW/kg (Calculated) : 66.2  Vital Signs: Temp: 98.6 F (37 C) (10/24 0431) Temp Source: Oral (10/24 0431) BP: 97/63 (10/24 0431) Pulse Rate: 95 (10/24 0431) Intake/Output from previous day: 10/23 0701 - 10/24 0700 In: 480 [P.O.:480] Out: 100 [Stool:100] Intake/Output from this shift: Total I/O In: 0  Out: 100 [Stool:100]  Labs:  Recent Labs  04/27/16 0602 04/28/16 0542 04/29/16 0602  WBC  --  13.7*  --   HGB  --  9.8*  --   HCT  --  28.9*  --   PLT  --  684*  --   CREATININE 0.57  --  0.60   Estimated Creatinine Clearance: 107.3 mL/min (by C-G formula based on SCr of 0.6 mg/dL).  BMP Latest Ref Rng & Units 04/29/2016 04/27/2016 04/26/2016  Glucose 65 - 99 mg/dL 829(F102(H) 87 86  BUN 6 - 20 mg/dL <6(O<5(L) <1(H<5(L) <0(Q<5(L)  Creatinine 0.44 - 1.00 mg/dL 6.570.60 8.460.57 9.620.56  BUN/Creat Ratio 9 - 23 - - -  Sodium 135 - 145 mmol/L 136 136 137  Potassium 3.5 - 5.1 mmol/L 4.0 3.5 3.4(L)  Chloride 101 - 111 mmol/L 101 103 103  CO2 22 - 32 mmol/L 28 26 28   Calcium 8.9 - 10.3 mg/dL 9.5(M8.6(L) 7.8(L) 7.7(L)   Assessment: 49 yof cc abdominal pain s/p open sigmoid colectomy with end colostomy, open drainage of pelvic abscess, appendectomy, placement of pelvic drains. Pharmacy consulted to replace electrolytes.  Goal of Therapy:  K 3.5 to 5 Mg 1.7 to 2.4 Phos 2.5 to 4.6  Plan:  K= 3.4.   Will give KCl 10 meQ IV x 3. Will recheck K with am labs.   10/24 0602 K 4. No further supplementation warranted. Will recheck all electrolytes with AM labs.  Eleanor Dimichele A. Winderookson, Vermont[place]arm.D., BCPS Clinical Pharmacist 04/29/2016 6:54 AM

## 2016-04-29 NOTE — Progress Notes (Signed)
Physical Therapy Treatment Patient Details Name: Sabrina Espinoza MRN: [id number] DOB: August 06, 1965 Today's Date: 04/29/2016    History of Present Illness Sabrina Espinoza is a 50yo white female who [facility]12 with ABD pain, and later that day required colectomy, cyst drainage, apendectomy, and placement of 2 pelvic drainage tubes. and later that day required colectomy, cyst drainage, apendectomy, and placement of 2 pelvic drainage tubes. At baseline, pt is fully independent, lives alone, without mobility restrictions or limitations. PMH: COPD.     PT Comments    Ms. Pricilla Espinoza is making good progress but remains unsteady with dynamic activities, requiring min guard assist.  She tolerated all interventions well this date.  Pt will benefit from continued skilled PT services to increase functional independence and safety.   Follow Up Recommendations  SNF     Equipment Recommendations  None recommended by PT    Recommendations for Other Services       Precautions / Restrictions Precautions Precautions: Fall Precaution Comments: JP drain Restrictions Weight Bearing Restrictions: No    Mobility  Bed Mobility Overal bed mobility: Needs Assistance Bed Mobility: Rolling;Sidelying to Sit Rolling: Min guard Sidelying to sit: Min guard       General bed mobility comments: Cues for log roll to protect abdomen.  Pt requires increased time and effort and use of bed rail.  Transfers Overall transfer level: Needs assistance Equipment used: None Transfers: Sit to/from Stand Sit to Stand: Min guard         General transfer comment: Min guard as pt unsteady but no LOB  Ambulation/Gait Ambulation/Gait assistance: Min guard Ambulation Distance (Feet): 350 Feet Assistive device: None Gait Pattern/deviations: Step-through pattern;Decreased stride length;Drifts right/left Gait velocity: decreased Gait velocity interpretation: Below normal speed for age/gender General Gait Details: Pt unsteady but with no LOB.  Min guard for safety due to instability and pt limited by  pain.   Stairs            Wheelchair Mobility    Modified Rankin (Stroke Patients Only)       Balance Overall balance assessment: Needs assistance Sitting-balance support: No upper extremity supported;Feet supported Sitting balance-Leahy Scale: Good     Standing balance support: No upper extremity supported;During functional activity Standing balance-Leahy Scale: Fair Standing balance comment: Unsteady with dynamic activities             High level balance activites: Side stepping;Braiding;Backward walking;Direction changes;Turns;Sudden stops;Head turns High Level Balance Comments: Min instability but no LOB with high level balance activities    Cognition Arousal/Alertness: Awake/alert Behavior During Therapy: Flat affect Overall Cognitive Status: Within Functional Limits for tasks assessed                      Exercises General Exercises - Upper Extremity Shoulder Flexion: AROM;Both;Seated;5 reps General Exercises - Lower Extremity Ankle Circles/Pumps: AROM;Both;10 reps;Supine Long Arc Quad: AROM;Both;10 reps;Seated Hip Flexion/Marching: Both;10 reps;Standing    General Comments        Pertinent Vitals/Pain Pain Assessment: 0-10 Pain Score: 9  Pain Location: abdomen Pain Descriptors / Indicators: Aching Pain Intervention(s): Limited activity within patient's tolerance;Monitored during session    Home Living                      Prior Function            PT Goals (current goals can now be found in the care plan section) Acute Rehab PT Goals Patient Stated Goal: decreased pain PT Goal Formulation: With patient Time For Goal Achievement: 05/04/16 Potential to Achieve Goals:  Good Progress towards PT goals: Progressing toward goals    Frequency    Min 2X/week      PT Plan Current plan remains appropriate    Co-evaluation             End of Session Equipment Utilized During Treatment: Gait belt Activity  Tolerance: Patient tolerated treatment well;Patient limited by fatigue;Patient limited by pain Patient left: with call bell/phone within reach;in bed;with family/visitor present (seated EOB)     Time: 1610-9604 PT Time Calculation (min) (ACUTE ONLY): 13 min  Charges:  $Gait Training: 8-22 mins                    G Codes:       Encarnacion Chu PT, DPT 04/29/2016, 2:44 PM

## 2016-04-29 NOTE — Clinical Social Work Placement (Signed)
   CLINICAL SOCIAL WORK PLACEMENT  NOTE  Date:  04/29/2016  Patient Details  Name: Sabrina Espinoza MRN: [id number] Date of Birth: 10/01/1965  Clinical Social Work is seeking post-discharge placement for this patient at the Skilled  Nursing Facility level of care (*CSW will initial, date and re-position this form in  chart as items are completed):  Yes   Patient/family provided with [facility] Clinical Social Work Department's list of facilities offering this level of care within the geographic area requested by the patient (or if unable, by the patient's family).  Yes   Patient/family informed of their freedom to choose among providers that offer the needed level of care, that participate in Medicare, Medicaid or managed care program needed by the patient, have an available bed and are willing to accept the patient.  Yes   Patient/family informed of [facility]'s ownership interest in [facility] and [facility], as well as of the fact that they are under no obligation to receive care at these facilities.  PASRR submitted to EDS on 04/26/16     PASRR number received on 04/26/16     Existing PASRR number confirmed on       FL2 transmitted to all facilities in geographic area requested by pt/family on 04/29/16     FL2 transmitted to all facilities within larger geographic area on       Patient informed that his/her managed care company has contracts with or will negotiate with certain facilities, including the following:        Yes   Patient/family informed of bed offers received.  Patient chooses bed at  [facility])     Physician recommends and patient chooses bed at  [facility](SNF)    Patient to be transferred to  [facility]) on 04/29/16.  Patient to be transferred to facility by  (personal vehicle)     Patient family notified on 04/29/16 of transfer.  Name of family member notified:   (Patient does not wish her d/c plan be shared with her mother)      PHYSICIAN       Additional Comment:    _______________________________________________ York SpanielMonica Eriq Hufford, LCSW 04/29/2016, 12:08 PM

## 2016-04-29 NOTE — Progress Notes (Signed)
04/29/2016  Subjective: Patient is 12 Days Post-Op from a Hartman's procedure for perforated diverticulitis. Patient continues to do well and there were no acute events overnight. She was seen by physical therapy and social work will been assisting the patient with dispo planning for SNF. No fevers overnight and her pain continues to improve and the patient is ambulating well.  Vital signs: Temp:  [98 F (36.7 C)-98.6 F (37 C)] 98.6 F (37 C) (10/24 0431) Pulse Rate:  [95-96] 95 (10/24 0431) Resp:  [18-20] 18 (10/24 0431) BP: (97-99)/(63-69) 97/63 (10/24 0431) SpO2:  [93 %-95 %] 93 % (10/24 0431)   Intake/Output: 10/23 0701 - 10/24 0700 In: 480 [P.O.:480] Out: 100 [Stool:100] Last BM Date: 04/26/16  Physical Exam: Constitutional: Acute distress Abdomen: Soft, nondistended, appropriately tender. JP drain with serous fluid. Ostomy with viable mucosa and stool in the bag. Midline incision with mild serous drainage but otherwise no evidence of infection.  Labs:   Recent Labs  04/28/16 0542  WBC 13.7*  HGB 9.8*  HCT 28.9*  PLT 684*    Recent Labs  04/27/16 0602 04/29/16 0602  NA 136 136  K 3.5 4.0  CL 103 101  CO2 26 28  GLUCOSE 87 102*  BUN <5* <5*  CREATININE 0.57 0.60  CALCIUM 7.8* 8.6*   No results for input(s): LABPROT, INR in the last 72 hours.  Imaging: No results found.  Assessment/Plan: This a 50 year old female status post Hartman's procedure for perforated diverticulitis.  -We'll continue oral antibiotics for a 2 week course total. Her white blood cell count continues to improve and she has been afebrile. -We'll discharge the patient to SNF today. -We'll remove JP drain today prior to discharge.   Howie IllJose Luis Julita Ozbun, MD [facility]

## 2016-04-30 ENCOUNTER — Emergency Department
Admission: EM | Admit: 2016-04-30 | Discharge: 2016-04-30 | Disposition: A | Discharge status: Home / Self Care | Payer: Medicare Other | Attending: Emergency Medicine | Admitting: Emergency Medicine

## 2016-04-30 ENCOUNTER — Encounter: Payer: Self-pay | Admitting: Emergency Medicine

## 2016-04-30 DIAGNOSIS — Z4801 Encounter for change or removal of surgical wound dressing: Secondary | ICD-10-CM | POA: Insufficient documentation

## 2016-04-30 DIAGNOSIS — J449 Chronic obstructive pulmonary disease, unspecified: Secondary | ICD-10-CM | POA: Diagnosis not present

## 2016-04-30 DIAGNOSIS — F17210 Nicotine dependence, cigarettes, uncomplicated: Secondary | ICD-10-CM | POA: Insufficient documentation

## 2016-04-30 DIAGNOSIS — Z5189 Encounter for other specified aftercare: Secondary | ICD-10-CM

## 2016-04-30 DIAGNOSIS — R531 Weakness: Secondary | ICD-10-CM | POA: Diagnosis not present

## 2016-04-30 DIAGNOSIS — R109 Unspecified abdominal pain: Secondary | ICD-10-CM | POA: Diagnosis present

## 2016-04-30 DIAGNOSIS — E039 Hypothyroidism, unspecified: Secondary | ICD-10-CM | POA: Insufficient documentation

## 2016-04-30 MED ORDER — OXYCODONE-ACETAMINOPHEN 7.5-325 MG PO TABS: 1.0000 | ORAL_TABLET | ORAL | 0 refills | Status: DC | PRN

## 2016-04-30 MED ORDER — OXYCODONE HCL 5 MG PO TABS
10.0000 mg | ORAL_TABLET | Freq: Once | ORAL | Status: AC
  Administered 2016-04-30: 10 mg via ORAL
  Filled 2016-04-30: qty 2

## 2016-04-30 MED ORDER — CEPHALEXIN 250 MG PO CAPS: 250.0000 mg | ORAL_CAPSULE | Freq: Four times a day (QID) | ORAL | 0 refills | Status: AC

## 2016-04-30 NOTE — ED Triage Notes (Signed)
Per ems [facility]  Was [facility]  States the staff removed her colostomy bag and placed her in a diaper.  So she signed out and went home

## 2016-04-30 NOTE — Care Management Note (Signed)
Case Management Note  Patient Details  Name: Sabrina Espinoza MRN: [id number] Date of Birth: Nov 16, 1965  Subjective/Objective:  Spoke to patient with family at bedside and they are determined to try to go home with PT. [facility] and he says they will put her on the list for Home PT and nursing. MD mase aware and working on face to face documentation.                  Action/Plan:   Expected Discharge Date:                  Expected Discharge Plan:     In-House Referral:     Discharge planning Services     Post Acute Care Choice:    Choice offered to:     DME Arranged:    DME Agency:     HH Arranged:    HH Agency:     Status of Service:     If discussed at [employer] Length of Stay Meetings, dates discussed:    Additional Comments:  Sabrina BueCheryl Armella Stogner, RN 04/30/2016, 11:53 AM

## 2016-04-30 NOTE — ED Provider Notes (Signed)
[facility] Emergency Department Provider Note        Time seen: ----------------------------------------- 10:55 AM on 04/30/2016 -----------------------------------------    I have reviewed the triage vital signs and the nursing notes.   HISTORY  Chief Complaint Abdominal Pain    HPI Sabrina Espinoza is a 50 y.o. female who presents to the ER after having had bowel surgery at [facility] and was taken to [facility] yesterday after a prolonged hospital course involving exploratory laparotomy, partial colectomy with colostomy placement. Patient states she went to [facility] where they did not treat her well and did not take care of her colostomy and essentially put a diaper over the colostomy site rather than placing a normal colostomy bag. She signed out AGAINST MEDICAL ADVICE.   Past Medical History:  Diagnosis Date  . Anxiety   . Chronic constipation    on Linzess, Miralax, Zofran  . Chronic insomnia 11/02/2015  . COPD, moderate (HCC)   . Depression   . Depression with anxiety    Followed by Dr. [name], [facility] at [facility] and [name] for thrapy  . History of chicken pox   . History of cocaine use   . History of pneumonia   . Hypothyroidism, adult    Working with Dr. Horton ChinShamil [name], Endocrinology. S/P RAIU and is planning on biopsy  . Migraine without status migrainosus, not intractable   . Nicotine dependence, uncomplicated   . Nontoxic multinodular goiter    FNA done 10/30/14 with benign results. Seen [name]. Patient may want a second opinion.  . Rhinitis, allergic   . Vitamin D deficiency     Patient Active Problem List   Diagnosis Date Noted  . Diverticulitis large intestine 04/17/2016  . Diverticulitis of colon with perforation   . Pallor 03/11/2016  . Tongue papillae atrophy 03/11/2016  . Encounter for medication monitoring 03/11/2016  . Peripheral neuropathy (HCC)  03/11/2016  . Thrush 03/11/2016  . Primary insomnia 02/04/2016  . Right cervical radiculopathy 01/15/2016  . Chronic insomnia 11/02/2015  . Right knee pain 08/22/2015  . Midline low back pain without sciatica 05/07/2015  . Elevated blood pressure 04/20/2015  . Polypharmacy 03/22/2015  . Foot pain, right 03/16/2015  . Pain pharynx 03/08/2015  . GERD without esophagitis 03/08/2015  . Abnormal mammogram of right breast 03/07/2015  . Degenerative disc disease, cervical 02/26/2015  . Bilateral leg cramps 02/26/2015  . Skin lesions, generalized 02/26/2015  . Allergic rhinitis with postnasal drip 01/31/2015  . Chronic sinusitis with recurrent bronchitis 01/31/2015  . Oral thrush 01/31/2015  . History of cocaine use 01/16/2015  . 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 Chronic hepatitis C without hepatic coma (HCC) 01/16/2015  . Chronic constipation 01/16/2015  . COPD, moderate (HCC) 01/16/2015  . Cigarette smoker 01/16/2015  . Bipolar 1 disorder, mixed, partial remission (HCC) 01/16/2015  . Migraine without aura and without status migrainosus, not intractable 01/16/2015  . Depression with anxiety 01/16/2015  . Cephalalgia 03/06/2014  . Imbalance 03/06/2014  . Insomnia due to medical condition 03/06/2014    Past Surgical History:  Procedure Laterality Date  . ANKLE FRACTURE SURGERY  1988   rods and pins in place  . APPENDECTOMY  04/17/2016   Procedure: APPENDECTOMY;  Surgeon: [name], MD;  Location: [facility] ORS;  Service: General;;  . CESAREAN SECTION  1996  . COLECTOMY WITH COLOSTOMY CREATION/HARTMANN PROCEDURE N/A 04/17/2016   Procedure: COLOSTOMY CREATION/HARTMANN PROCEDURE;  Surgeon: [name],  MD;  Location: [facility] ORS;  Service: General;  Laterality: N/A;  . ECTOPIC PREGNANCY SURGERY  2003  . LAPAROTOMY N/A 04/17/2016   Procedure: EXPLORATORY LAPAROTOMY;  Surgeon: Leafy Ro, MD;  Location: [facility] ORS;  Service: General;  Laterality: N/A;  . NECK SURGERY    .  SHOULDER SURGERY      Allergies Review of patient's allergies indicates no known allergies.  Social History Social History  Substance Use Topics  . Smoking status: Current Every Day Smoker    Packs/day: 0.75    Years: 32.00    Types: Cigarettes  . Smokeless tobacco: Never Used  . Alcohol use No    Review of Systems Constitutional: Negative for fever. Cardiovascular: Negative for chest pain. Respiratory: Negative for shortness of breath. Gastrointestinal: Negative for abdominal pain, vomiting and diarrhea. Genitourinary: Negative for dysuria. Musculoskeletal: Negative for back pain. Skin: Negative for rash. Neurological: Negative for headaches, focal weakness or numbness.  10-point ROS otherwise negative.  ____________________________________________   PHYSICAL EXAM:  VITAL SIGNS: ED Triage Vitals  Enc Vitals Group     BP 04/30/16 1003 121/79     Pulse Rate 04/30/16 1003 94     Resp 04/30/16 1003 18     Temp 04/30/16 1003 97.8 F (36.6 C)     Temp Source 04/30/16 1003 Oral     SpO2 04/30/16 1003 99 %     Weight 04/30/16 1004 182 lb (82.6 kg)     Height 04/30/16 1004 5\' 9"  (1.753 m)     Head Circumference --      Peak Flow --      Pain Score 04/30/16 1004 5     Pain Loc --      Pain Edu? --      Excl. in GC? --    Constitutional: Alert and oriented. Well appearing and in no distress. Eyes: Conjunctivae are normal. PERRL. Normal extraocular movements. ENT   Head: Normocephalic and atraumatic.   Nose: No congestion/rhinnorhea.   Mouth/Throat: Mucous membranes are moist.   Neck: No stridor. Cardiovascular: Normal rate, regular rhythm. No murmurs, rubs, or gallops. Respiratory: Normal respiratory effort without tachypnea nor retractions. Breath sounds are clear and equal bilaterally. No wheezes/rales/rhonchi. Gastrointestinal: Soft and nontender. Hypoactive bowel sounds, extensive laparotomy incision is present. Mild surrounding erythema with  granulation tissue. There is inferior wound dehiscence with packing in place. Musculoskeletal: Nontender with normal range of motion in all extremities. No lower extremity tenderness nor edema. Neurologic:  Normal speech and language. No gross focal neurologic deficits are appreciated.  Skin:  Skin is warm, dry and intact. No rash noted. Psychiatric: Mood and affect are normal. Speech and behavior are normal.  ____________________________________________  ED COURSE:  Pertinent labs & imaging results that were available during my care of the patient were reviewed by me and considered in my medical decision making (see chart for details). Clinical Course  Patient presents the ER in no acute distress, concerned about poor recent care for her colostomy in possible fecal contamination of her wounds.  Procedures ____________________________________________  FINAL ASSESSMENT AND PLAN  Medical screening exam, colostomy site care  Plan: Patient with labs and imaging as dictated above. Patient is in no acute distress, family is going to take her home. We have arranged for home health and physical therapy with PT and nursing assessment at home. I will discharge her on antibiotics to prevent wound infection and pain medicine. She appears stable for outpatient follow-up at this time.   Mayford Knife,  Cecille Amsterdam, MD   Note: This dictation was prepared with Dragon dictation. Any transcriptional errors that result from this process are unintentional    Emily Filbert, MD 04/30/16 1236

## 2016-04-30 NOTE — Progress Notes (Signed)
[facility]  Patient Status: Unable to reach patient today via phone to schedule appointment, tried numbers in Epic. Alerted Berna Bueheryl Wilder, CM. Patient does have [facility] phone number to contact.    If patient discharges after hours, please call [phone].   Dimple CaseyJason E Hinton 04/30/2016, 3:58 PM

## 2016-04-30 NOTE — ED Notes (Signed)
See triage note   On arrival colostomy area open  No bag  Large amt of stool cleaned up and new bag placed   States she left [facility] this am AMA  .

## 2016-04-30 NOTE — ED Notes (Signed)
Extra colostomy supplies and abd dressing given with instructions

## 2016-04-30 NOTE — ED Notes (Signed)
Suture line intact except for lower abd  Packing remains in place

## 2016-04-30 NOTE — Care Management Note (Signed)
Case Management Note  Patient Details  Name: Sabrina Espinoza MRN: [id number] Date of Birth: Feb 08, 1966  Subjective/Objective:    [facility] will see the patient for wound care, RN and PT. I have called the surgeons office to have them help by ordering the ostomy and would supplies needed. I obtained a patient consent from [facility] , so they will have that. The patient appears very appreciative , hugging me in thanks. I have asked the RN here to give the patient some wound and ostomy supplies to get her started. She has  [employer] yellow card with the number of the homecare agency, in case she needs to call them. I have asked her to call the surgeon in 24 hours to confirm the order for ostomy supplies, as I have not yet heard back from them.                Action/Plan:   Expected Discharge Date:                  Expected Discharge Plan:     In-House Referral:     Discharge planning Services     Post Acute Care Choice:    Choice offered to:     DME Arranged:    DME Agency:     HH Arranged:    HH Agency:     Status of Service:     If discussed at [employer] Length [employer] Stay Meetings, dates discussed:    Additional Comments:  Berna BueCheryl Jamar Weatherall, RN 04/30/2016, 1:23 PM

## 2016-05-01 ENCOUNTER — Emergency Department
Admission: EM | Admit: 2016-05-01 | Discharge: 2016-05-01 | Disposition: A | Discharge status: Home / Self Care | Payer: Medicare Other | Attending: Emergency Medicine | Admitting: Emergency Medicine

## 2016-05-01 ENCOUNTER — Encounter: Payer: Self-pay | Admitting: Emergency Medicine

## 2016-05-01 DIAGNOSIS — Z79899 Other long term (current) drug therapy: Secondary | ICD-10-CM | POA: Diagnosis not present

## 2016-05-01 DIAGNOSIS — E039 Hypothyroidism, unspecified: Secondary | ICD-10-CM | POA: Diagnosis not present

## 2016-05-01 DIAGNOSIS — Y828 Other medical devices associated with adverse incidents: Secondary | ICD-10-CM | POA: Insufficient documentation

## 2016-05-01 DIAGNOSIS — T814XXA Infection following a procedure, initial encounter: Secondary | ICD-10-CM | POA: Diagnosis not present

## 2016-05-01 DIAGNOSIS — J449 Chronic obstructive pulmonary disease, unspecified: Secondary | ICD-10-CM | POA: Insufficient documentation

## 2016-05-01 DIAGNOSIS — Z791 Long term (current) use of non-steroidal anti-inflammatories (NSAID): Secondary | ICD-10-CM | POA: Insufficient documentation

## 2016-05-01 DIAGNOSIS — Z9889 Other specified postprocedural states: Secondary | ICD-10-CM | POA: Diagnosis not present

## 2016-05-01 DIAGNOSIS — Z4889 Encounter for other specified surgical aftercare: Secondary | ICD-10-CM

## 2016-05-01 DIAGNOSIS — F17210 Nicotine dependence, cigarettes, uncomplicated: Secondary | ICD-10-CM | POA: Insufficient documentation

## 2016-05-01 MED ORDER — MUPIROCIN 2 % EX OINT
TOPICAL_OINTMENT | CUTANEOUS | 0 refills | Status: AC
Start: 2016-05-01 — End: 2017-05-01

## 2016-05-01 NOTE — Care Management Note (Signed)
Case Management Note  Patient Details  Name: Sabrina Espinoza MRN: [id number] Date of Birth: 04-03-1966  Subjective/Objective: Call received from patient mother, Junious DresserConnie. She has taken the info for [facility] and for the surgery office. She states she will try to contact both to ensure followup.                   Action/Plan:   Expected Discharge Date:                  Expected Discharge Plan:     In-House Referral:     Discharge planning Services     Post Acute Care Choice:    Choice offered to:     DME Arranged:    DME Agency:     HH Arranged:    HH Agency:     Status of Service:     If discussed at [employer] Length [employer] Stay Meetings, dates discussed:    Additional Comments:  Berna BueCheryl Azai Gaffin, RN 05/01/2016, 10:02 AM

## 2016-05-01 NOTE — ED Notes (Signed)
Pt verbalized understanding of wound care and colostomy care again. Pt verbalized understanding of d/c instructions, rx instructions and f/u care with surgeon. Pt ambulatory to wheelchair, pt ambulatory to car with family member. Pt refused to sign for discharge instructions.

## 2016-05-01 NOTE — ED Triage Notes (Addendum)
Pt seen here yesterday, d/c to [facility] and pt left there AMA. MD to bedside, pt with abdominal wound and MD reports it looks the same as yesterday. Pt reports she doesn't know how to empty her colostomy bag, this RN emptied upon arrival. Pt A/O. Pt with paper towel packed into abdominal wound.

## 2016-05-01 NOTE — ED Notes (Signed)
Wet to dry dressing placed over abdominal wound.

## 2016-05-01 NOTE — ED Provider Notes (Signed)
[facility] Emergency Department Provider Note        Time seen: ----------------------------------------- 2:37 PM on 05/01/2016 -----------------------------------------    I have reviewed the triage vital signs and the nursing notes.   HISTORY  Chief Complaint Other    HPI Sabrina Espinoza is a 50 y.o. female who presents to the ER being brought for possible wound infection. She has no abdominal laparotomy incision that has dehisced but this was previously known and she was being sent to [facility] for rehabilitation and wound care. Yesterday she left AGAINST MEDICAL ADVICE from [facility] and we had established home health for her. Home health nurse arrived today and sent her here for evaluation of her wound. Patient states she does not know how to empty her colostomy bag although she was instructed how to yesterday. She is not sure why she is here.   Past Medical History:  Diagnosis Date  . Anxiety   . Chronic constipation    on Linzess, Miralax, Zofran  . Chronic insomnia 11/02/2015  . COPD, moderate (HCC)   . Depression   . Depression with anxiety    Followed by Dr. [name], psychiatrist at [facility] and [name] for thrapy  . History of chicken pox   . History of cocaine use   . History of pneumonia   . Hypothyroidism, adult    Working with Dr. Horton ChinShamil [name], Endocrinology. S/P RAIU and is planning on biopsy  . Migraine without status migrainosus, not intractable   . Nicotine dependence, uncomplicated   . Nontoxic multinodular goiter    FNA done 10/30/14 with benign results. Seen [name]. Patient may want a second opinion.  . Rhinitis, allergic   . Vitamin D deficiency     Patient Active Problem List   Diagnosis Date Noted  . Diverticulitis large intestine 04/17/2016  . Diverticulitis of colon with perforation   . Pallor 03/11/2016  . Tongue papillae atrophy 03/11/2016  . Encounter for medication  monitoring 03/11/2016  . Peripheral neuropathy (HCC) 03/11/2016  . Thrush 03/11/2016  . Primary insomnia 02/04/2016  . Right cervical radiculopathy 01/15/2016  . Chronic insomnia 11/02/2015  . Right knee pain 08/22/2015  . Midline low back pain without sciatica 05/07/2015  . Elevated blood pressure 04/20/2015  . Polypharmacy 03/22/2015  . Foot pain, right 03/16/2015  . Pain pharynx 03/08/2015  . GERD without esophagitis 03/08/2015  . Abnormal mammogram of right breast 03/07/2015  . Degenerative disc disease, cervical 02/26/2015  . Bilateral leg cramps 02/26/2015  . Skin lesions, generalized 02/26/2015  . Allergic rhinitis with postnasal drip 01/31/2015  . Chronic sinusitis with recurrent bronchitis 01/31/2015  . Oral thrush 01/31/2015  . History of cocaine use 01/16/2015  . 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 Chronic hepatitis C without hepatic coma (HCC) 01/16/2015  . Chronic constipation 01/16/2015  . COPD, moderate (HCC) 01/16/2015  . Cigarette smoker 01/16/2015  . Bipolar 1 disorder, mixed, partial remission (HCC) 01/16/2015  . Migraine without aura and without status migrainosus, not intractable 01/16/2015  . Depression with anxiety 01/16/2015  . Cephalalgia 03/06/2014  . Imbalance 03/06/2014  . Insomnia due to medical condition 03/06/2014    Past Surgical History:  Procedure Laterality Date  . ANKLE FRACTURE SURGERY  1988   rods and pins in place  . APPENDECTOMY  04/17/2016   Procedure: APPENDECTOMY;  Surgeon: [name], MD;  Location: [facility] ORS;  Service: General;;  . CESAREAN SECTION  1996  . COLECTOMY WITH  COLOSTOMY CREATION/HARTMANN PROCEDURE N/A 04/17/2016   Procedure: COLOSTOMY CREATION/HARTMANN PROCEDURE;  Surgeon: Leafy Ro, MD;  Location: [facility] ORS;  Service: General;  Laterality: N/A;  . ECTOPIC PREGNANCY SURGERY  2003  . LAPAROTOMY N/A 04/17/2016   Procedure: EXPLORATORY LAPAROTOMY;  Surgeon: Leafy Ro, MD;  Location: [facility] ORS;  Service:  General;  Laterality: N/A;  . NECK SURGERY    . SHOULDER SURGERY      Allergies Review of patient's allergies indicates no known allergies.  Social History Social History  Substance Use Topics  . Smoking status: Current Every Day Smoker    Packs/day: 0.75    Years: 32.00    Types: Cigarettes  . Smokeless tobacco: Never Used  . Alcohol use No    Review of Systems Constitutional: Negative for fever. Cardiovascular: Negative for chest pain. Respiratory: Negative for shortness of breath. Gastrointestinal: Positive for abdominal pain Genitourinary: Negative for dysuria. Musculoskeletal: Negative for back pain. Skin: Positive for wound on her abdominal wall Neurological: Negative for headaches, focal weakness or numbness.   10-point ROS otherwise negative.  ____________________________________________   PHYSICAL EXAM:  VITAL SIGNS: ED Triage Vitals [05/01/16 1432]  Enc Vitals Group     BP 130/88     Pulse Rate (!) 105     Resp 20     Temp 98.5 F (36.9 C)     Temp Source Oral     SpO2 99 %     Weight 182 lb (82.6 kg)     Height 5\' 9"  (1.753 m)     Head Circumference      Peak Flow      Pain Score 9     Pain Loc      Pain Edu?      Excl. in GC?     Constitutional: Alert and oriented. Anxious, tearful. Eyes: Conjunctivae are normal. PERRL. Normal extraocular movements. ENT   Head: Normocephalic and atraumatic.   Nose: No congestion/rhinnorhea.   Mouth/Throat: Mucous membranes are moist.   Neck: No stridor. Cardiovascular: Normal rate, regular rhythm. No murmurs, rubs, or gallops. Respiratory: Normal respiratory effort without tachypnea nor retractions. Breath sounds are clear and equal bilaterally. No wheezes/rales/rhonchi. Gastrointestinal: Soft and nontender. Normal bowel sounds. Large laparotomy incision with inferior dehiscence. Granulation tissue is present, mild surrounding erythema. Musculoskeletal: Nontender with normal range of motion in  all extremities. No lower extremity tenderness nor edema. Neurologic:  Normal speech and language. No gross focal neurologic deficits are appreciated.  Skin:  Skin is warm, dry and intact. No rash noted. Psychiatric: Depressed mood and affect, tearful ____________________________________________  ED COURSE:  Pertinent labs & imaging results that were available during my care of the patient were reviewed by me and considered in my medical decision making (see chart for details). Clinical Course  Patient presents to the ER in no distress but is tearful uncertain etiology. She needs wound care but no other specific treatment at this time.  Procedures ____________________________________________  FINAL ASSESSMENT AND PLAN  Postoperative wound care, wound dehiscence  Plan: Patient was seen here last yesterday by me and at home health arranged for the patient. Home health sent patient here for wound care which we have performed. She is having wet to dry dressing changes. Yesterday had ordered antibiotics to prevent wound infection. She is stable for outpatient follow-up with general surgery.   Emily Filbert, MD   Note: This dictation was prepared with Dragon dictation. Any transcriptional errors that result from this process are unintentional  Emily Filbert, MD 05/01/16 [phone]

## 2016-05-01 NOTE — ED Notes (Signed)
Pt with large abdominal wound to lower abdomen, pt with staples intact. MD to bedside to evaluate. Pt educated again on colostomy care and wound care. Pt verbalized understanding.

## 2016-05-06 ENCOUNTER — Ambulatory Visit: Payer: Medicare Other | Admitting: General Surgery

## 2016-06-02 ENCOUNTER — Other Ambulatory Visit: Payer: Self-pay | Admitting: Family Medicine

## 2016-06-02 DIAGNOSIS — Z1231 Encounter for screening mammogram for malignant neoplasm of breast: Secondary | ICD-10-CM

## 2016-06-11 ENCOUNTER — Other Ambulatory Visit: Payer: Self-pay | Admitting: Family Medicine

## 2016-06-11 DIAGNOSIS — R0982 Postnasal drip: Principal | ICD-10-CM

## 2016-06-11 DIAGNOSIS — J309 Allergic rhinitis, unspecified: Secondary | ICD-10-CM

## 2016-06-13 ENCOUNTER — Telehealth: Payer: Self-pay

## 2016-06-13 DIAGNOSIS — T8149XA Infection following a procedure, other surgical site, initial encounter: Secondary | ICD-10-CM

## 2016-06-13 DIAGNOSIS — T8143XA Infection following a procedure, organ and space surgical site, initial encounter: Secondary | ICD-10-CM | POA: Insufficient documentation

## 2016-06-13 NOTE — Telephone Encounter (Signed)
Patient called in at this time per the request of [facility] nurse according to the patient. She states that she is having bowel movements through her rectum and is very upset by this as she has a colostomy. I explained to the patient that she would pass mucous periodically and this is completely normal, not to worry about this. She also states that she has been having pain in her stoma constantly since surgery and occasionally has blood at site of colostomy. Patient does state that she has difficulty passing stools at times, even with the use of prune juice, and typically takes 3 days before a stool is passed, then bleeding occurs at Colostomy site. Encouraged patient to use 72 oz. Of water every single day for which she does not feel is possible. Also encouraged use of Miralax 17g daily to help move stools through. Has a lengthy involved conversation with the patient as it was difficult to understand her over the phone due to slurred words and then need to repeat herself frequently.  Patient has been placed on schedule next week to see operating surgeon, Dr. Everlene FarrierPabon. She is encouraged to call back should she need anything further prior to this appointment. She verbalized understanding of this.

## 2016-06-17 ENCOUNTER — Telehealth: Payer: Self-pay | Admitting: Surgery

## 2016-06-17 NOTE — Telephone Encounter (Signed)
Called Velna HatchetSheila [facility]) and she wanted us to know that patient was having difficulty with the negative pressure device. Therefore, patient requested to have it removed. Velna HatchetSheila removed it per patient's request but wanted to inform us. Velna HatchetSheila also stated that she goes every other day and checks on the patient and patient's wound. She is currently using wet to dry gauze on her wound until told different by Dr. Everlene FarrierPabon. Patient has been doing well with this.  Velna HatchetSheila wanted me to call her after patient's appointment to know if she should continue wet to dry gauze or if she should apply the negative pressure device back on the patient. I told her that I would call her after we see the patient. Velna HatchetSheila agreed.

## 2016-06-17 NOTE — Telephone Encounter (Signed)
Sabrina Espinoza from [facility] called in regards to [name]. "Pt has a negative pressure device and having issues with it. She requested it be removed until she sees Dr. Everlene FarrierPabon. The device was removed on Friday, 12.8.17. [facility] is performing alternate moist to moist saline dressings due to the removal of the device." If you have questions or concerns, please contact [name] or Sabrina Espinoza.  Sheila's ph# [phone] Thank you.

## 2016-06-20 ENCOUNTER — Encounter: Payer: Self-pay | Admitting: Surgery

## 2016-06-20 ENCOUNTER — Inpatient Hospital Stay
Admission: AD | Admit: 2016-06-20 | Discharge: 2016-06-20 | DRG: 872 | Discharge status: Against Medical Advice | Payer: Medicare Other | Source: Ambulatory Visit | Attending: Surgery | Admitting: Surgery

## 2016-06-20 ENCOUNTER — Ambulatory Visit (INDEPENDENT_AMBULATORY_CARE_PROVIDER_SITE_OTHER): Payer: Medicare Other | Admitting: Surgery

## 2016-06-20 VITALS — BP 142/95 | HR 128 | Temp 103.0°F | Resp 28 | Ht 69.0 in | Wt 165.4 lb

## 2016-06-20 DIAGNOSIS — K5909 Other constipation: Secondary | ICD-10-CM | POA: Diagnosis present

## 2016-06-20 DIAGNOSIS — E559 Vitamin D deficiency, unspecified: Secondary | ICD-10-CM | POA: Diagnosis present

## 2016-06-20 DIAGNOSIS — Z8701 Personal history of pneumonia (recurrent): Secondary | ICD-10-CM | POA: Diagnosis not present

## 2016-06-20 DIAGNOSIS — Z818 Family history of other mental and behavioral disorders: Secondary | ICD-10-CM | POA: Diagnosis not present

## 2016-06-20 DIAGNOSIS — L03311 Cellulitis of abdominal wall: Secondary | ICD-10-CM | POA: Diagnosis present

## 2016-06-20 DIAGNOSIS — Z933 Colostomy status: Secondary | ICD-10-CM

## 2016-06-20 DIAGNOSIS — R51 Headache: Secondary | ICD-10-CM

## 2016-06-20 DIAGNOSIS — Z833 Family history of diabetes mellitus: Secondary | ICD-10-CM | POA: Diagnosis not present

## 2016-06-20 DIAGNOSIS — R5081 Fever presenting with conditions classified elsewhere: Secondary | ICD-10-CM | POA: Diagnosis not present

## 2016-06-20 DIAGNOSIS — F17210 Nicotine dependence, cigarettes, uncomplicated: Secondary | ICD-10-CM | POA: Diagnosis present

## 2016-06-20 DIAGNOSIS — R509 Fever, unspecified: Secondary | ICD-10-CM | POA: Diagnosis present

## 2016-06-20 DIAGNOSIS — E039 Hypothyroidism, unspecified: Secondary | ICD-10-CM | POA: Diagnosis present

## 2016-06-20 DIAGNOSIS — Z79899 Other long term (current) drug therapy: Secondary | ICD-10-CM | POA: Diagnosis not present

## 2016-06-20 DIAGNOSIS — A419 Sepsis, unspecified organism: Secondary | ICD-10-CM | POA: Diagnosis present

## 2016-06-20 DIAGNOSIS — F418 Other specified anxiety disorders: Secondary | ICD-10-CM | POA: Diagnosis present

## 2016-06-20 DIAGNOSIS — R519 Headache, unspecified: Secondary | ICD-10-CM

## 2016-06-20 DIAGNOSIS — Z8249 Family history of ischemic heart disease and other diseases of the circulatory system: Secondary | ICD-10-CM

## 2016-06-20 DIAGNOSIS — J449 Chronic obstructive pulmonary disease, unspecified: Secondary | ICD-10-CM | POA: Diagnosis present

## 2016-06-20 DIAGNOSIS — I10 Essential (primary) hypertension: Secondary | ICD-10-CM | POA: Diagnosis present

## 2016-06-20 DIAGNOSIS — E042 Nontoxic multinodular goiter: Secondary | ICD-10-CM | POA: Diagnosis present

## 2016-06-20 DIAGNOSIS — Z7951 Long term (current) use of inhaled steroids: Secondary | ICD-10-CM

## 2016-06-20 DIAGNOSIS — N390 Urinary tract infection, site not specified: Secondary | ICD-10-CM | POA: Diagnosis present

## 2016-06-20 LAB — COMPREHENSIVE METABOLIC PANEL
ALBUMIN: 3.5 g/dL (ref 3.5–5.0)
ALT: 17 U/L (ref 14–54)
ANION GAP: 8 (ref 5–15)
AST: 32 U/L (ref 15–41)
Alkaline Phosphatase: 90 U/L (ref 38–126)
BUN: 8 mg/dL (ref 6–20)
CHLORIDE: 102 mmol/L (ref 101–111)
CO2: 23 mmol/L (ref 22–32)
Calcium: 8.7 mg/dL — ABNORMAL LOW (ref 8.9–10.3)
Creatinine, Ser: 0.92 mg/dL (ref 0.44–1.00)
GFR calc Af Amer: >60 mL/min (ref >60)
Glucose, Bld: 85 mg/dL (ref 65–99)
POTASSIUM: 3.2 mmol/L — AB (ref 3.5–5.1)
Sodium: 133 mmol/L — ABNORMAL LOW (ref 135–145)
TOTAL PROTEIN: 7.2 g/dL (ref 6.5–8.1)
Total Bilirubin: 0.4 mg/dL (ref 0.3–1.2)

## 2016-06-20 LAB — CBC
HEMATOCRIT: 32.7 % — AB (ref 35.0–47.0)
Hemoglobin: 11 g/dL — ABNORMAL LOW (ref 12.0–16.0)
MCH: 28.1 pg (ref 26.0–34.0)
MCHC: 33.6 g/dL (ref 32.0–36.0)
MCV: 83.7 fL (ref 80.0–100.0)
PLATELETS: 417 10*3/uL (ref 150–440)
RBC: 3.9 MIL/uL (ref 3.80–5.20)
RDW: 15.7 % — AB (ref 11.5–14.5)
WBC: 15.6 10*3/uL — ABNORMAL HIGH (ref 3.6–11.0)

## 2016-06-20 LAB — URINALYSIS, COMPLETE (UACMP) WITH MICROSCOPIC
Bilirubin Urine: NEGATIVE
GLUCOSE, UA: NEGATIVE mg/dL
KETONES UR: NEGATIVE mg/dL
Nitrite: NEGATIVE
PROTEIN: 30 mg/dL — AB
Specific Gravity, Urine: 1.019 (ref 1.005–1.030)
pH: 7 (ref 5.0–8.0)

## 2016-06-20 LAB — URINALYSIS, DIPSTICK ONLY
BILIRUBIN URINE: NEGATIVE
GLUCOSE, UA: NEGATIVE mg/dL
HGB URINE DIPSTICK: NEGATIVE
KETONES UR: NEGATIVE mg/dL
Nitrite: NEGATIVE
PH: 7 (ref 5.0–8.0)
Protein, ur: NEGATIVE mg/dL
Specific Gravity, Urine: 1.018 (ref 1.005–1.030)

## 2016-06-20 LAB — APTT: APTT: 29 s (ref 24–36)

## 2016-06-20 LAB — URINE DRUG SCREEN, QUALITATIVE (ARMC ONLY)
Amphetamines, Ur Screen: NOT DETECTED
BARBITURATES, UR SCREEN: POSITIVE — AB
Benzodiazepine, Ur Scrn: NOT DETECTED
CANNABINOID 50 NG, UR ~~LOC~~: NOT DETECTED
COCAINE METABOLITE, UR ~~LOC~~: NOT DETECTED
MDMA (Ecstasy)Ur Screen: NOT DETECTED
METHADONE SCREEN, URINE: NOT DETECTED
OPIATE, UR SCREEN: NOT DETECTED
PHENCYCLIDINE (PCP) UR S: NOT DETECTED
Tricyclic, Ur Screen: POSITIVE — AB

## 2016-06-20 LAB — PHOSPHORUS: PHOSPHORUS: 2.3 mg/dL — AB (ref 2.5–4.6)

## 2016-06-20 LAB — PREGNANCY, URINE: Preg Test, Ur: NEGATIVE

## 2016-06-20 LAB — PROTIME-INR
INR: 0.98
PROTHROMBIN TIME: 13 s (ref 11.4–15.2)

## 2016-06-20 LAB — TSH: TSH: 0.241 u[IU]/mL — AB (ref 0.350–4.500)

## 2016-06-20 LAB — MAGNESIUM: MAGNESIUM: 1.4 mg/dL — AB (ref 1.7–2.4)

## 2016-06-20 MED ORDER — LAMOTRIGINE 100 MG PO TABS: 300.0000 mg | ORAL_TABLET | Freq: Every day | ORAL | Status: DC

## 2016-06-20 MED ORDER — HYDRALAZINE HCL 20 MG/ML IJ SOLN: 10.0000 mg | INTRAMUSCULAR | Status: DC | PRN

## 2016-06-20 MED ORDER — BUTALBITAL-APAP-CAFFEINE 50-325-40 MG PO TABS
1.0000 | ORAL_TABLET | ORAL | Status: DC | PRN
  Administered 2016-06-20: 1 via ORAL
  Filled 2016-06-20 (×2): qty 1

## 2016-06-20 MED ORDER — NICOTINE 21 MG/24HR TD PT24
21.0000 mg | MEDICATED_PATCH | Freq: Every day | TRANSDERMAL | Status: DC
  Administered 2016-06-20: 21 mg via TRANSDERMAL
  Filled 2016-06-20: qty 1

## 2016-06-20 MED ORDER — ONDANSETRON 4 MG PO TBDP: 4.0000 mg | ORAL_TABLET | Freq: Four times a day (QID) | ORAL | Status: DC | PRN

## 2016-06-20 MED ORDER — MAGNESIUM SULFATE 2 GM/50ML IV SOLN
2.0000 g | Freq: Once | INTRAVENOUS | Status: DC
  Filled 2016-06-20: qty 50

## 2016-06-20 MED ORDER — ACETAMINOPHEN 325 MG PO TABS
650.0000 mg | ORAL_TABLET | Freq: Four times a day (QID) | ORAL | Status: DC | PRN
  Filled 2016-06-20: qty 2

## 2016-06-20 MED ORDER — MOMETASONE FURO-FORMOTEROL FUM 200-5 MCG/ACT IN AERO
2.0000 | INHALATION_SPRAY | Freq: Two times a day (BID) | RESPIRATORY_TRACT | Status: DC
  Filled 2016-06-20 (×2): qty 8.8

## 2016-06-20 MED ORDER — SUMATRIPTAN SUCCINATE 50 MG PO TABS: 100.0000 mg | ORAL_TABLET | ORAL | Status: DC | PRN

## 2016-06-20 MED ORDER — ENOXAPARIN SODIUM 40 MG/0.4ML ~~LOC~~ SOLN: 40.0000 mg | SUBCUTANEOUS | Status: DC

## 2016-06-20 MED ORDER — ONDANSETRON HCL 4 MG/2ML IJ SOLN
4.0000 mg | Freq: Four times a day (QID) | INTRAMUSCULAR | Status: DC | PRN
  Administered 2016-06-20: 4 mg via INTRAVENOUS
  Filled 2016-06-20: qty 2

## 2016-06-20 MED ORDER — LORAZEPAM 2 MG/ML IJ SOLN
2.0000 mg | INTRAMUSCULAR | Status: DC | PRN
  Administered 2016-06-20: 2 mg via INTRAVENOUS
  Filled 2016-06-20: qty 1

## 2016-06-20 MED ORDER — SODIUM CHLORIDE 0.9 % IV SOLN: Freq: Once | INTRAVENOUS | Status: DC

## 2016-06-20 MED ORDER — DIPHENHYDRAMINE HCL 50 MG/ML IJ SOLN: 25.0000 mg | Freq: Four times a day (QID) | INTRAMUSCULAR | Status: DC | PRN

## 2016-06-20 MED ORDER — LACTATED RINGERS IV SOLN
INTRAVENOUS | Status: DC
  Administered 2016-06-20: 17:00:00 via INTRAVENOUS

## 2016-06-20 MED ORDER — DIPHENHYDRAMINE HCL 50 MG/ML IJ SOLN: 12.5000 mg | Freq: Once | INTRAMUSCULAR | Status: DC

## 2016-06-20 MED ORDER — MORPHINE SULFATE (PF) 4 MG/ML IV SOLN: 2.0000 mg | INTRAVENOUS | Status: DC | PRN

## 2016-06-20 MED ORDER — DILTIAZEM HCL ER COATED BEADS 180 MG PO CP24
180.0000 mg | ORAL_CAPSULE | Freq: Every day | ORAL | Status: DC
  Filled 2016-06-20: qty 1

## 2016-06-20 MED ORDER — CLONAZEPAM 1 MG PO TABS: 1.0000 mg | ORAL_TABLET | Freq: Three times a day (TID) | ORAL | Status: DC

## 2016-06-20 MED ORDER — PIPERACILLIN-TAZOBACTAM 3.375 G IVPB: 3.3750 g | Freq: Three times a day (TID) | INTRAVENOUS | Status: DC

## 2016-06-20 MED ORDER — PANTOPRAZOLE SODIUM 40 MG IV SOLR: 40.0000 mg | Freq: Every day | INTRAVENOUS | Status: DC

## 2016-06-20 MED ORDER — DIPHENHYDRAMINE HCL 25 MG PO CAPS
25.0000 mg | ORAL_CAPSULE | Freq: Four times a day (QID) | ORAL | Status: DC | PRN
  Filled 2016-06-20: qty 1

## 2016-06-20 MED ORDER — CEFUROXIME AXETIL 250 MG PO TABS: 250.0000 mg | ORAL_TABLET | Freq: Two times a day (BID) | ORAL | 0 refills | Status: DC

## 2016-06-20 MED ORDER — FLUTICASONE PROPIONATE 50 MCG/ACT NA SUSP
1.0000 | Freq: Two times a day (BID) | NASAL | Status: DC
  Filled 2016-06-20: qty 16

## 2016-06-20 MED ORDER — VANCOMYCIN HCL IN DEXTROSE 750-5 MG/150ML-% IV SOLN: 750.0000 mg | Freq: Two times a day (BID) | INTRAVENOUS | Status: DC

## 2016-06-20 MED ORDER — LORAZEPAM 2 MG/ML IJ SOLN: 2.0000 mg | INTRAMUSCULAR | Status: DC | PRN

## 2016-06-20 MED ORDER — VANCOMYCIN HCL IN DEXTROSE 1-5 GM/200ML-% IV SOLN
1000.0000 mg | Freq: Once | INTRAVENOUS | Status: DC
  Filled 2016-06-20: qty 200

## 2016-06-20 MED ORDER — MONTELUKAST SODIUM 10 MG PO TABS: 10.0000 mg | ORAL_TABLET | Freq: Every day | ORAL | Status: DC

## 2016-06-20 MED ORDER — MORPHINE SULFATE (PF) 4 MG/ML IV SOLN
4.0000 mg | INTRAVENOUS | Status: DC | PRN
  Administered 2016-06-20: 4 mg via INTRAVENOUS
  Filled 2016-06-20: qty 1

## 2016-06-20 NOTE — Patient Instructions (Signed)
Please go straight to the Registration Desk at [facility] for Admission at this time.

## 2016-06-20 NOTE — Progress Notes (Signed)
I got a call , pt is refusing for all test and want to leave AMA- as she was told to go for CT- while her head is still hurting. She told" you all are not doing anything for me, I will rather go home"  I spoke to pt on phone again- convinced her to take the medicines I ordered for her headache- and then after 30 min- 1 hour co-operate for the tests. She agreed to that after explaining this.  I spoke to nurse on floor, and we will try like this.  I am concerned- if all these tests comes negative- we may also have to consider Lumber puncture.

## 2016-06-20 NOTE — Progress Notes (Signed)
Patient was cursing and yelling at primary RN since she was admitted. C/o that we are not doing anything in fact the primary RN has offered her meds that she needed. Patient was angry. Prime doc and Dr. Orvis BrillLoflin attempted to convince patient to stay but patient eventually left AMA at shift change. Mother was notified. Patient was escorted by security staff.

## 2016-06-20 NOTE — Progress Notes (Signed)
Patient ID: Sabrina Espinoza, female   DOB: 1966/07/02, 50 y.o.   MRN: [id number]  HPI Sabrina Espinoza is a 50 y.o. female well known to our service s/p hartmann's 2 months ago for perforated diverticulitis. She does have a hx of severe psychiatric disorder to include bipolar and severe anxiety. She takes phenergan, lamictal and Ropinirole ( all of them can cause neuroleptic malignant syndrome). Apparently today has increase in abdominal pain, and some redness in her abdomen. Her immediate post op course was complicated by a wound infection and was treated w A/Bs and wound vac. Her wound is almost completely healed. She just ate some crackle Barrel, no nausea or vomiting and colostomy is working. She is very anxious almost to the point of being psychotic and is a very difficult patient to examine and to tace care of.  HPI  Past Medical History:  Diagnosis Date  . Anxiety   . Chronic constipation    on Linzess, Miralax, Zofran  . Chronic insomnia 11/02/2015  . COPD, moderate (HCC)   . Depression   . Depression with anxiety    Follow[facility] [name][facility] and [name] for thrapy  . History of chicken pox   . History of cocaine use   . History of pneumonia   . Hypothyroidism, adult    Working with Dr. Horton ChinShamil [name], Endocrinology. S/P RAIU and is planning on biopsy  . Migraine without status migrainosus, not intractable   . Nicotine dependence, uncomplicated   . Nontoxic multinodular goiter    FNA done 10/30/14 with benign results. Seen [name]. Patient may want a second opinion.  . Rhinitis, allergic   . Vitamin D deficiency     Past Surgical History:  Procedure Laterality Date  . ANKLE FRACTURE SURGERY  1988   rods and pins in place  . APPENDECTOMY  04/17/2016   Procedure: APPENDECTOMY;  Surgeon: Leafy Roiego F Cass Edinger, MD;  Location: [facility] ORS;  Service: General;;  . CESAREAN SECTION  1996  . COLECTOMY WITH COLOSTOMY CREATION/HARTMANN  PROCEDURE N/A 04/17/2016   Procedure: COLOSTOMY CREATION/HARTMANN PROCEDURE;  Surgeon: Leafy Roiego F Calden Dorsey, MD;  Location: [facility] ORS;  Service: General;  Laterality: N/A;  . ECTOPIC PREGNANCY SURGERY  2003  . LAPAROTOMY N/A 04/17/2016   Procedure: EXPLORATORY LAPAROTOMY;  Surgeon: Leafy Roiego F Meyli Boice, MD;  Location: [facility] ORS;  Service: General;  Laterality: N/A;  . NECK SURGERY    . SHOULDER SURGERY      Family History  Problem Relation Age of Onset  . Heart disease Father   . Alcohol abuse Father   . Diabetes Father   . Heart disease Brother   . Depression Brother   . Stroke Brother   . Alcohol abuse Paternal Grandfather   . Osteoporosis Mother   . Breast cancer Neg Hx     Social History Social History  Substance Use Topics  . Smoking status: Current Every Day Smoker    Packs/day: 0.75    Years: 32.00    Types: Cigarettes  . Smokeless tobacco: Never Used  . Alcohol use No    No Known Allergies  Current Outpatient Prescriptions  Medication Sig Dispense Refill  . acetaminophen (TYLENOL) 325 MG tablet Take 2 tablets (650 mg total) by mouth every 6 (six) hours as needed for mild pain or fever. 30 tablet 0  . azelastine (ASTELIN) 0.1 % nasal spray Place 1 spray into the nose 2 (two) times daily. Use in each nostril as directed     .  budesonide-formoterol (SYMBICORT) 160-4.5 MCG/ACT inhaler Inhale 2 puffs into the lungs 2 (two) times daily. USE 2 PUFFS 2 TIMES A DAY 10.2 Inhaler 2  . butalbital-acetaminophen-caffeine (FIORICET, ESGIC) 50-325-40 MG tablet Take 2 tablets by mouth every 6 (six) hours as needed for headache.    . Calcium Carb-Cholecalciferol (CALCIUM CARBONATE-VITAMIN D3) 600-400 MG-UNIT TABS Take 1 tablet by mouth 2 (two) times daily.     . clonazePAM (KLONOPIN) 1 MG tablet Take 1 mg by mouth 3 (three) times daily.     [name]. diltiazem (CARDIZEM CD) 180 MG 24 hr capsule Take 1 capsule (180 mg total) by mouth daily. 30 capsule 1  . EPIPEN 2-PAK 0.3 MG/0.3ML SOAJ injection Inject 0.3  mg into the muscle once.     . fluticasone (FLONASE) 50 MCG/ACT nasal spray Place 1 spray into both nostrils 2 (two) times daily. 16 g 11  . gabapentin (NEURONTIN) 400 MG capsule Take 400 mg by mouth 3 (three) times daily.     . hydrOXYzine (VISTARIL) 50 MG capsule Take 50 mg by mouth 2 (two) times daily.    [name]. ibuprofen (ADVIL,MOTRIN) 600 MG tablet Take 1 tablet (600 mg total) by mouth every 8 (eight) hours as needed for fever or mild pain. 30 tablet 0  . lamoTRIgine (LAMICTAL) 150 MG tablet Take 300 mg by mouth daily.    [name]. levocetirizine (XYZAL) 5 MG tablet Take 1 tablet (5 mg total) by mouth every evening. 30 tablet 5  . montelukast (SINGULAIR) 10 MG tablet TAKE 1 TABLET (10 MG TOTAL) BY MOUTH AT BEDTIME. 30 tablet 6  . mupirocin ointment (BACTROBAN) 2 % Apply to affected area 3 times daily 22 g 0  . nicotine (NICODERM CQ - DOSED IN MG/24 HOURS) 21 mg/24hr patch Place 1 patch (21 mg total) onto the skin daily. 28 patch 0  . nortriptyline (PAMELOR) 50 MG capsule Take 100 mg by mouth at bedtime.     [name]. oxyCODONE 10 MG TABS Take 1 tablet (10 mg total) by mouth every 4 (four) hours as needed for moderate pain or breakthrough pain. 30 tablet 0  . oxyCODONE-acetaminophen (PERCOCET) 7.5-325 MG tablet Take 1 tablet by mouth every 4 (four) hours as needed for severe pain. 20 tablet 0  . pantoprazole (PROTONIX) 40 MG tablet Take 1 tablet (40 mg total) by mouth daily as needed. (Patient taking differently: Take 40 mg by mouth every morning. ) 30 tablet 2  . polyethylene glycol powder (GLYCOLAX/MIRALAX) powder Take 17 g by mouth daily as needed for mild constipation. 527 g 2  . prazosin (MINIPRESS) 5 MG capsule Take 5 mg by mouth at bedtime.    [name]. rOPINIRole (REQUIP) 2 MG tablet Take 2 mg by mouth 2 (two) times daily.  4  . VENTOLIN HFA 108 (90 Base) MCG/ACT inhaler Inhale 2 puffs into the lungs every 4 (four) hours as needed for wheezing or shortness of breath. 1 Inhaler 1  . zaleplon (SONATA) 10 MG capsule  Take 10 mg by mouth at bedtime.      No current facility-administered medications for this visit.      Review of Systems A 10 point review of systems was asked and was negative except for the information on the HPI  Physical Exam Blood pressure (!) 142/95, pulse (!) 128, temperature (!) 103 F (39.4 C), temperature source Oral, resp. rate (!) 28, height 5\' 9"  (1.753 m), weight 75 kg (165 lb 6.4 oz). CONSTITUTIONAL: Anxious and agitated, some flight of ideas EYES: Pupils  are equal, round, and reactive to light, Sclera are non-icteric. EARS, NOSE, MOUTH AND THROAT: The oropharynx is clear. The oral mucosa is pink and moist. Hearing is intact to voice. LYMPH NODES:  Lymph nodes in the neck are normal. RESPIRATORY:  Lungs are clear. There is normal respiratory effort, with equal breath sounds bilaterally, and without pathologic use of accessory muscles. CARDIOVASCULAR: Heart is regular without murmurs, gallops, or rubs. Tachycardic GI: The abdomen is  soft, colostomy working. Wound for the most part almost completely healed, no evidence of necrotizing infection or abscess. There is some erythema to the left of her colostomy. Mild TTP , no peritonitis. I removed 3 staples that were left still in the wound. GU: Rectal deferred.   MUSCULOSKELETAL: Normal muscle strength and tone. No cyanosis or edema.   SKIN: Turgor is good and there are no pathologic skin lesions or ulcers. NEUROLOGIC: Motor and sensation is grossly normal. Cranial nerves are grossly intact. PSYCH:  Oriented to person, place and time. She is extremely anxious and w some flight of ideas. Data Reviewed I have personally reviewed the patient's imaging, laboratory findings and medical records.    Assessment Plan Fever 2 months after Hartmann's, it is very difficult to establish the source of fevers since she is very anxious w flight of ideas. Main course of action is to r/o any intra-abdominal source, abdominal wall abscess or  pelvic abscess. Differential will include malignant hyperthermia. She will be admitted to the hospital, hydrated and a CT A/P w contrast will be obtained as well as BC and labs . As  I was explained all these to her, she left the office and stated she needed to smoke and she will be coming right back for admission.  I do not think she will need emergent surgical intervention but given her psychiatric hx it is difficult to rule it out. D/W Dr. Orvis Brill who is our partner in the inhospital setting.    Sterling Big, MD FACS General Surgeon 06/20/2016, 2:48 PM

## 2016-06-20 NOTE — Progress Notes (Signed)
Pharmacy Antibiotic Note  Sabrina Espinoza is a 50 y.o. female admitted on 06/20/2016 with sepsis.  Pharmacy has been consulted for vancomycin/zosyn dosing.  Plan: Initiated vancomycin 1000mg  IV x1 and Zosyn 3.375g IV x1.  Vancomycin 750mg  IV every 12 hours.  Goal trough 15-20 mcg/mL. Zosyn 3.375g IV q8h (4 hour infusion).  Vanc trough should be drawn before the fifth dose.   VD=40.67 Ke=0.07 T1/2= 9.90 Stacked= 6hr    Temp (24hrs), Avg:103.2 F (39.6 C), Min:103 F (39.4 C), Max:103.4 F (39.7 C)   Recent Labs Lab 06/20/16 1612  WBC 15.6*  CREATININE 0.92    Estimated Creatinine Clearance: 79 mL/min (by C-G formula based on SCr of 0.92 mg/dL).    No Known Allergies  Antimicrobials this admission: Vancomycin 12/15 >>  Zosyn 12/15 >>   Microbiology results: 12/15 BCx:  12/15 UCx:   Thank you for allowing pharmacy to be a part of this patient's care.  Delsa BernKelly m Takira Sherrin, PharmD 06/20/2016 7:12 PM

## 2016-06-20 NOTE — Progress Notes (Signed)
UA reviewed - positive for UTI.  Pt want to leave AMA. I gave a prescription for cefuroxime 250 mg oral BID. Spoke to pt to take that.

## 2016-06-20 NOTE — Consult Note (Signed)
[facility] [place] [facility]   PATIENT NAME: Sabrina Espinoza    MR#:  [id number]  DATE OF BIRTH:  Mar 20, 1966  DATE OF ADMISSION:  06/20/2016  PRIMARY CARE PHYSICIAN: Domenic SchwabLindley,Cheryl Paulette, FNP   REQUESTING/REFERRING PHYSICIAN: Loflin  CHIEF COMPLAINT: fever  HISTORY OF PRESENT ILLNESS: Sabrina Espinoza  is a 50 y.o. female with a known history of Anxiety, COPD, Depression, migraine- had perforated diverticulitis 2 mths ago- had surgery and colostomy- follows at surgical clinic. For last 2 days , she is very weak- hardly can get up. Did not check her temp, but have chills. Denies cough, abdominal pain or urinary symptoms. Have severe headache and left ear pain. She suffers from migraines and takes fioricet and BC powders at home. She tried same today, but it did not help. She went to surgical clinic- admitted for fever as temp was 103 F. She is not a very good historian and agitated due to psych issues ad headache, but denies any other complains. Medical consult to help further manage.  PAST MEDICAL HISTORY:   Past Medical History:  Diagnosis Date  . Anxiety   . Chronic constipation    on Linzess, Miralax, Zofran  . Chronic insomnia 11/02/2015  . COPD, moderate (HCC)   . Depression   . Depression with anxiety    Followed by Dr. [name], [facility] and [name] for thrapy  . History of chicken pox   . History of cocaine use   . History of pneumonia   . Hypothyroidism, adult    Working with Dr. Horton ChinShamil [name], Endocrinology. S/P RAIU and is planning on biopsy  . Migraine without status migrainosus, not intractable   . Nicotine dependence, uncomplicated   . Nontoxic multinodular goiter    FNA done 10/30/14 with benign results. Seen [name]. Patient may want a second opinion.  . Rhinitis, allergic   . Vitamin D deficiency     PAST SURGICAL HISTORY: Past Surgical History:  Procedure Laterality Date  . ANKLE FRACTURE  SURGERY  1988   rods and pins in place  . APPENDECTOMY  04/17/2016   Procedure: APPENDECTOMY;  Surgeon: [name], MD;  Location: [facility] ORS;  Service: General;;  . CESAREAN SECTION  1996  . COLECTOMY WITH COLOSTOMY CREATION/HARTMANN PROCEDURE N/A 04/17/2016   Procedure: COLOSTOMY CREATION/HARTMANN PROCEDURE;  Surgeon: [name], MD;  Location: [facility] ORS;  Service: General;  Laterality: N/A;  . ECTOPIC PREGNANCY SURGERY  2003  . LAPAROTOMY N/A 04/17/2016   Procedure: EXPLORATORY LAPAROTOMY;  Surgeon: [name], MD;  Location: [facility] ORS;  Service: General;  Laterality: N/A;  . NECK SURGERY    . SHOULDER SURGERY      SOCIAL HISTORY:  Social History  Substance Use Topics  . Smoking status: Current Every Day Smoker    Packs/day: 0.75    Years: 32.00    Types: Cigarettes  . Smokeless tobacco: Never Used  . Alcohol use No    FAMILY HISTORY:  Family History  Problem Relation Age of Onset  . Heart disease Father   . Alcohol abuse Father   . Diabetes Father   . Heart disease Brother   . Depression Brother   . Stroke Brother   . Alcohol abuse Paternal Grandfather   . Osteoporosis Mother   . Breast cancer Neg Hx     DRUG ALLERGIES: No Known Allergies  REVIEW OF SYSTEMS:   CONSTITUTIONAL: No fever,positive for fatigue or weakness.  EYES: No  blurred or double vision.  EARS, NOSE, AND THROAT: No tinnitus or ear pain.  RESPIRATORY: No cough, shortness of breath, wheezing or hemoptysis.  CARDIOVASCULAR: No chest pain, orthopnea, edema.  GASTROINTESTINAL: No nausea, vomiting, diarrhea or abdominal pain. Since colostomy- she always have loose stool. GENITOURINARY: No dysuria, hematuria.  ENDOCRINE: No polyuria, nocturia,  HEMATOLOGY: No anemia, easy bruising or bleeding SKIN: No rash or lesion. MUSCULOSKELETAL: No joint pain or arthritis.   NEUROLOGIC: No tingling, numbness, weakness.  PSYCHIATRY: No anxiety or depression.   MEDICATIONS AT HOME:  Prior to Admission  medications   Medication Sig Start Date End Date Taking? Authorizing Provider  acetaminophen (TYLENOL) 325 MG tablet Take 2 tablets (650 mg total) by mouth every 6 (six) hours as needed for mild pain or fever. 04/29/16  Yes Henrene Dodge, MD  azelastine (ASTELIN) 0.1 % nasal spray Place 1 spray into the nose 2 (two) times daily. Use in each nostril as directed    Yes Historical Provider, MD  budesonide-formoterol (SYMBICORT) 160-4.5 MCG/ACT inhaler Inhale 2 puffs into the lungs 2 (two) times daily. USE 2 PUFFS 2 TIMES A DAY 11/02/15  Yes Kerman Passey, MD  butalbital-acetaminophen-caffeine (FIORICET, ESGIC) 50-325-40 MG tablet Take 1 tablet by mouth every 4 (four) hours as needed for headache.    Yes Historical Provider, MD  Calcium Carb-Cholecalciferol (CALCIUM CARBONATE-VITAMIN D3) 600-400 MG-UNIT TABS Take 1 tablet by mouth 2 (two) times daily.    Yes Historical Provider, MD  clonazePAM (KLONOPIN) 1 MG tablet Take 1 mg by mouth 3 (three) times daily.  02/09/14  Yes Historical Provider, MD  cyclobenzaprine (FLEXERIL) 10 MG tablet Take 10 mg by mouth 3 (three) times daily as needed for muscle spasms.   Yes Historical Provider, MD  diltiazem (CARDIZEM CD) 180 MG 24 hr capsule Take 1 capsule (180 mg total) by mouth daily. 04/30/16  Yes Henrene Dodge, MD  dimenhyDRINATE (DRAMAMINE) 50 MG tablet Take 50 mg by mouth every 8 (eight) hours as needed.   Yes Historical Provider, MD  fluticasone (FLONASE) 50 MCG/ACT nasal spray Place 1 spray into both nostrils 2 (two) times daily. 12/11/15  Yes Kerman Passey, MD  gabapentin (NEURONTIN) 400 MG capsule Take 400 mg by mouth 3 (three) times daily.  02/09/14  Yes Historical Provider, MD  hydrOXYzine (VISTARIL) 50 MG capsule Take 50 mg by mouth 2 (two) times daily.   Yes Historical Provider, MD  ibuprofen (ADVIL,MOTRIN) 600 MG tablet Take 1 tablet (600 mg total) by mouth every 8 (eight) hours as needed for fever or mild pain. 04/29/16  Yes Henrene Dodge, MD  lamoTRIgine  (LAMICTAL) 150 MG tablet Take 300 mg by mouth daily.   Yes Historical Provider, MD  montelukast (SINGULAIR) 10 MG tablet TAKE 1 TABLET (10 MG TOTAL) BY MOUTH AT BEDTIME. 01/23/16  Yes Kerman Passey, MD  mupirocin ointment (BACTROBAN) 2 % Apply to affected area 3 times daily 05/01/16 05/01/17 Yes Emily Filbert, MD  nicotine (NICODERM CQ - DOSED IN MG/24 HOURS) 21 mg/24hr patch Place 1 patch (21 mg total) onto the skin daily. 04/30/16  Yes Henrene Dodge, MD  nortriptyline (PAMELOR) 50 MG capsule Take 100 mg by mouth at bedtime.    Yes Historical Provider, MD  ondansetron (ZOFRAN) 4 MG tablet Take 4 mg by mouth every 6 (six) hours as needed for nausea or vomiting.   Yes Historical Provider, MD  oxyCODONE 10 MG TABS Take 1 tablet (10 mg total) by mouth every 4 (four) hours  as needed for moderate pain or breakthrough pain. 04/29/16  Yes Henrene Dodge, MD  pantoprazole (PROTONIX) 40 MG tablet Take 1 tablet (40 mg total) by mouth daily as needed. Patient taking differently: Take 40 mg by mouth every morning.  03/11/16  Yes Kerman Passey, MD  polyethylene glycol powder (GLYCOLAX/MIRALAX) powder Take 17 g by mouth daily as needed for mild constipation. 04/17/16  Yes Kerman Passey, MD  prazosin (MINIPRESS) 5 MG capsule Take 5 mg by mouth at bedtime.   Yes Historical Provider, MD  promethazine (PHENERGAN) 25 MG tablet Take 25 mg by mouth every 6 (six) hours as needed for nausea or vomiting.   Yes Historical Provider, MD  VENTOLIN HFA 108 (90 Base) MCG/ACT inhaler Inhale 2 puffs into the lungs every 4 (four) hours as needed for wheezing or shortness of breath. 04/18/16  Yes Kerman Passey, MD  zaleplon (SONATA) 10 MG capsule Take 10 mg by mouth at bedtime.  08/02/15  Yes Historical Provider, MD  EPIPEN 2-PAK 0.3 MG/0.3ML SOAJ injection Inject 0.3 mg into the muscle once.  02/23/15   Historical Provider, MD      PHYSICAL EXAMINATION:   VITAL SIGNS: Blood pressure (!) 124/99, pulse (!) 123, temperature (!)  103.4 F (39.7 C), temperature source Oral, resp. rate 20, SpO2 100 %.  GENERAL:  50 y.o.-year-old patient lying in the bed with some acute distress.  EYES: Pupils equal, round, reactive to light and accommodation. No scleral icterus. Extraocular muscles intact.  HEENT: Head atraumatic, normocephalic. Oropharynx and nasopharynx clear. No local tenderness on local pressure on or around ears and mastoid. NECK:  Supple, no jugular venous distention. No thyroid enlargement, no tenderness.  LUNGS: Normal breath sounds bilaterally, no wheezing, rales,rhonchi or crepitation. No use of accessory muscles of respiration.  CARDIOVASCULAR: S1, S2 normal. No murmurs, rubs, or gallops.  ABDOMEN: Soft, nontender, nondistended. Bowel sounds present. No organomegaly or mass. Colostomy in place, redness surrounding the site. laprotomy sutures and dressing in place. EXTREMITIES: No pedal edema, cyanosis, or clubbing.  NEUROLOGIC: Cranial nerves II through XII are intact. Muscle strength 5/5 in all extremities. Sensation intact. Gait not checked.  PSYCHIATRIC: The patient is alert and oriented x 3. But she is agitated due to her headache.  SKIN: No obvious rash, lesion, or ulcer.   LABORATORY PANEL:   CBC  Recent Labs Lab 06/20/16 1612  WBC 15.6*  HGB 11.0*  HCT 32.7*  PLT 417  MCV 83.7  MCH 28.1  MCHC 33.6  RDW 15.7*   ------------------------------------------------------------------------------------------------------------------  Chemistries   Recent Labs Lab 06/20/16 1612  NA 133*  K 3.2*  CL 102  CO2 23  GLUCOSE 85  BUN 8  CREATININE 0.92  CALCIUM 8.7*  MG 1.4*  AST 32  ALT 17  ALKPHOS 90  BILITOT 0.4   ------------------------------------------------------------------------------------------------------------------ estimated creatinine clearance is 79 mL/min (by C-G formula based on SCr of 0.92  mg/dL). ------------------------------------------------------------------------------------------------------------------  Recent Labs  06/20/16 1612  TSH 0.241*     Coagulation profile  Recent Labs Lab 06/20/16 1612  INR 0.98   ------------------------------------------------------------------------------------------------------------------- No results for input(s): DDIMER in the last 72 hours. -------------------------------------------------------------------------------------------------------------------  Cardiac Enzymes No results for input(s): CKMB, TROPONINI, MYOGLOBIN in the last 168 hours.  Invalid input(s): CK ------------------------------------------------------------------------------------------------------------------ Invalid input(s): POCBNP  ---------------------------------------------------------------------------------------------------------------  Urinalysis    Component Value Date/Time   COLORURINE YELLOW (A) 06/20/2016 1550   APPEARANCEUR HAZY (A) 06/20/2016 1550   LABSPEC 1.018 06/20/2016 1550   PHURINE 7.0 06/20/2016  1550   GLUCOSEU NEGATIVE 06/20/2016 1550   HGBUR NEGATIVE 06/20/2016 1550   BILIRUBINUR NEGATIVE 06/20/2016 1550   KETONESUR NEGATIVE 06/20/2016 1550   PROTEINUR NEGATIVE 06/20/2016 1550   UROBILINOGEN 0.2 04/03/2016 1425   NITRITE NEGATIVE 06/20/2016 1550   LEUKOCYTESUR LARGE (A) 06/20/2016 1550     RADIOLOGY: No results found.  EKG: Orders placed or performed during the hospital encounter of 04/17/16  . ED EKG  . ED EKG  . EKG 12-Lead  . EKG 12-Lead  . EKG 12-Lead  . EKG 12-Lead  . EKG 12-Lead  . EKG 12-Lead    IMPRESSION AND PLAN:  * Fever with sepsis   Fever, tachycardia, elevated WBcs.   I ordered Xray chest, Influenza screen, and urinalysis with microscopy.   CT abd ordered by surgery.   She have redness around the colostomy site. Likely cellulitis.    Pt is alert and oriented.    Vanc+ zosyn for  emperical coverage.    IV fluids , tylenol.    Follow bl cx.  * Migraine headache   IV magnesium, IV benadryl, Oral imitrax   Cont lamictal.  * Htn   cardizem and hydralazine.  * COPD   No exacerbation, cont home inhalers.  * Smoking   Councelled to quit smoking for 4 min, nicotin patch.   All the records are reviewed and case discussed with ED provider. Management plans discussed with the patient, family and they are in agreement.  CODE STATUS: full.    Code Status Orders        Start     Ordered   06/20/16 1550  Full code  Continuous     06/20/16 1549    Code Status History    Date Active Date Inactive Code Status Order ID Comments User Context   04/17/2016  1:48 PM 04/29/2016  3:53 PM Full Code [id number]  Ricarda Frameharles Woodham, MD Inpatient     Discussed with Dr. Orvis BrillLoflin and pt's mother.  TOTAL TIME TAKING CARE OF THIS PATIENT: 50 minutes.    Altamese DillingVACHHANI, Wilfred Siverson M.D on 06/20/2016   Between 7am to 6pm - Pager - [phone]  After 6pm go to [email or web address] - password [email or web address]  [facility]  Office  [phone]  CC: Primary care physician; Domenic SchwabLindley,Cheryl Paulette, FNP   Note: This dictation was prepared with Dragon dictation along with smaller phrase technology. Any transcriptional errors that result from this process are unintentional.

## 2016-06-20 NOTE — Progress Notes (Signed)
Patient refused to stay in the hospital.  The tech here to take her to CT scan and she has been cursing at the nursing staff and being combative.  She was informed that she should stay in the hospital and it could be dangerous.  She stated "you aren't doing anything for me here that I can't do at home. I would rather die on my living room floor."  I attempted to reason with the patient and she states that she wants to leave.  She was given AMA papers to sign.

## 2016-06-21 ENCOUNTER — Inpatient Hospital Stay
Admission: EM | Admit: 2016-06-21 | Discharge: 2016-06-24 | DRG: 872 | Discharge status: Against Medical Advice | Payer: Medicare Other | Attending: Internal Medicine | Admitting: Internal Medicine

## 2016-06-21 ENCOUNTER — Emergency Department: Payer: Medicare Other

## 2016-06-21 DIAGNOSIS — Z7951 Long term (current) use of inhaled steroids: Secondary | ICD-10-CM

## 2016-06-21 DIAGNOSIS — N3000 Acute cystitis without hematuria: Secondary | ICD-10-CM

## 2016-06-21 DIAGNOSIS — L03311 Cellulitis of abdominal wall: Secondary | ICD-10-CM

## 2016-06-21 DIAGNOSIS — F418 Other specified anxiety disorders: Secondary | ICD-10-CM | POA: Diagnosis present

## 2016-06-21 DIAGNOSIS — F3177 Bipolar disorder, in partial remission, most recent episode mixed: Secondary | ICD-10-CM | POA: Diagnosis not present

## 2016-06-21 DIAGNOSIS — E039 Hypothyroidism, unspecified: Secondary | ICD-10-CM | POA: Diagnosis present

## 2016-06-21 DIAGNOSIS — Z933 Colostomy status: Secondary | ICD-10-CM

## 2016-06-21 DIAGNOSIS — A419 Sepsis, unspecified organism: Principal | ICD-10-CM | POA: Diagnosis present

## 2016-06-21 DIAGNOSIS — E042 Nontoxic multinodular goiter: Secondary | ICD-10-CM | POA: Diagnosis present

## 2016-06-21 DIAGNOSIS — J449 Chronic obstructive pulmonary disease, unspecified: Secondary | ICD-10-CM | POA: Diagnosis present

## 2016-06-21 DIAGNOSIS — A401 Sepsis due to streptococcus, group B: Secondary | ICD-10-CM

## 2016-06-21 DIAGNOSIS — G4700 Insomnia, unspecified: Secondary | ICD-10-CM | POA: Diagnosis present

## 2016-06-21 DIAGNOSIS — F17210 Nicotine dependence, cigarettes, uncomplicated: Secondary | ICD-10-CM | POA: Diagnosis present

## 2016-06-21 DIAGNOSIS — R109 Unspecified abdominal pain: Secondary | ICD-10-CM

## 2016-06-21 DIAGNOSIS — Z79899 Other long term (current) drug therapy: Secondary | ICD-10-CM

## 2016-06-21 DIAGNOSIS — F3160 Bipolar disorder, current episode mixed, unspecified: Secondary | ICD-10-CM | POA: Diagnosis present

## 2016-06-21 DIAGNOSIS — N390 Urinary tract infection, site not specified: Secondary | ICD-10-CM

## 2016-06-21 DIAGNOSIS — K5909 Other constipation: Secondary | ICD-10-CM | POA: Diagnosis present

## 2016-06-21 LAB — URINALYSIS, ROUTINE W REFLEX MICROSCOPIC
BILIRUBIN URINE: NEGATIVE
Glucose, UA: NEGATIVE mg/dL
KETONES UR: NEGATIVE mg/dL
NITRITE: NEGATIVE
PH: 8 (ref 5.0–8.0)
Protein, ur: NEGATIVE mg/dL
SPECIFIC GRAVITY, URINE: 1.005 (ref 1.005–1.030)

## 2016-06-21 LAB — COMPREHENSIVE METABOLIC PANEL
ALBUMIN: 3.7 g/dL (ref 3.5–5.0)
ALK PHOS: 107 U/L (ref 38–126)
ALT: 25 U/L (ref 14–54)
AST: 40 U/L (ref 15–41)
Anion gap: 11 (ref 5–15)
BILIRUBIN TOTAL: 0.4 mg/dL (ref 0.3–1.2)
BUN: 7 mg/dL (ref 6–20)
CALCIUM: 8.8 mg/dL — AB (ref 8.9–10.3)
CO2: 22 mmol/L (ref 22–32)
CREATININE: 0.75 mg/dL (ref 0.44–1.00)
Chloride: 102 mmol/L (ref 101–111)
GFR calc Af Amer: >60 mL/min (ref >60)
GLUCOSE: 80 mg/dL (ref 65–99)
Potassium: 3 mmol/L — ABNORMAL LOW (ref 3.5–5.1)
Sodium: 135 mmol/L (ref 135–145)
TOTAL PROTEIN: 7.9 g/dL (ref 6.5–8.1)

## 2016-06-21 LAB — URINE CULTURE: Culture: 60000 — AB

## 2016-06-21 LAB — CBC WITH DIFFERENTIAL/PLATELET
BASOS ABS: 0.1 10*3/uL (ref 0–0.1)
Basophils Relative: 1 %
EOS ABS: 0.1 10*3/uL (ref 0–0.7)
EOS PCT: 0 %
HCT: 34 % — ABNORMAL LOW (ref 35.0–47.0)
Hemoglobin: 11.5 g/dL — ABNORMAL LOW (ref 12.0–16.0)
LYMPHS PCT: 18 %
Lymphs Abs: 2.8 10*3/uL (ref 1.0–3.6)
MCH: 28.6 pg (ref 26.0–34.0)
MCHC: 33.9 g/dL (ref 32.0–36.0)
MCV: 84.5 fL (ref 80.0–100.0)
MONO ABS: 1 10*3/uL — AB (ref 0.2–0.9)
Monocytes Relative: 6 %
Neutro Abs: 11.8 10*3/uL — ABNORMAL HIGH (ref 1.4–6.5)
Neutrophils Relative %: 75 %
PLATELETS: 382 10*3/uL (ref 150–440)
RBC: 4.02 MIL/uL (ref 3.80–5.20)
RDW: 15.9 % — AB (ref 11.5–14.5)
WBC: 15.7 10*3/uL — AB (ref 3.6–11.0)

## 2016-06-21 LAB — LACTIC ACID, PLASMA: Lactic Acid, Venous: 1.5 mmol/L (ref 0.5–1.9)

## 2016-06-21 MED ORDER — SODIUM CHLORIDE 0.9 % IV BOLUS (SEPSIS)
1000.0000 mL | Freq: Once | INTRAVENOUS | Status: AC
  Administered 2016-06-21: 1000 mL via INTRAVENOUS

## 2016-06-21 MED ORDER — ACETAMINOPHEN 650 MG RE SUPP: 650.0000 mg | Freq: Four times a day (QID) | RECTAL | Status: DC | PRN

## 2016-06-21 MED ORDER — MOMETASONE FURO-FORMOTEROL FUM 200-5 MCG/ACT IN AERO
2.0000 | INHALATION_SPRAY | Freq: Two times a day (BID) | RESPIRATORY_TRACT | Status: DC
  Administered 2016-06-21 – 2016-06-24 (×6): 2 via RESPIRATORY_TRACT
  Filled 2016-06-21: qty 8.8

## 2016-06-21 MED ORDER — POTASSIUM CHLORIDE IN NACL 20-0.9 MEQ/L-% IV SOLN
INTRAVENOUS | Status: DC
  Administered 2016-06-21 – 2016-06-23 (×4): via INTRAVENOUS
  Filled 2016-06-21 (×6): qty 1000

## 2016-06-21 MED ORDER — GABAPENTIN 400 MG PO CAPS
400.0000 mg | ORAL_CAPSULE | Freq: Three times a day (TID) | ORAL | Status: DC
  Administered 2016-06-21 – 2016-06-24 (×8): 400 mg via ORAL
  Filled 2016-06-21 (×8): qty 1

## 2016-06-21 MED ORDER — ALBUTEROL SULFATE (2.5 MG/3ML) 0.083% IN NEBU: 3.0000 mL | INHALATION_SOLUTION | RESPIRATORY_TRACT | Status: DC | PRN

## 2016-06-21 MED ORDER — MORPHINE SULFATE (PF) 4 MG/ML IV SOLN
INTRAVENOUS | Status: AC
  Administered 2016-06-21: 4 mg via INTRAVENOUS
  Filled 2016-06-21: qty 1

## 2016-06-21 MED ORDER — BUTALBITAL-APAP-CAFFEINE 50-325-40 MG PO TABS: 1.0000 | ORAL_TABLET | ORAL | Status: DC | PRN

## 2016-06-21 MED ORDER — IOPAMIDOL (ISOVUE-300) INJECTION 61%
30.0000 mL | Freq: Once | INTRAVENOUS | Status: AC | PRN
  Administered 2016-06-21: 30 mL via ORAL

## 2016-06-21 MED ORDER — ONDANSETRON HCL 4 MG PO TABS
4.0000 mg | ORAL_TABLET | Freq: Four times a day (QID) | ORAL | Status: DC | PRN
  Administered 2016-06-21: 4 mg via ORAL
  Filled 2016-06-21: qty 1

## 2016-06-21 MED ORDER — ACETAMINOPHEN 325 MG PO TABS
650.0000 mg | ORAL_TABLET | Freq: Once | ORAL | Status: AC
  Administered 2016-06-21: 650 mg via ORAL

## 2016-06-21 MED ORDER — MORPHINE SULFATE (PF) 4 MG/ML IV SOLN
2.0000 mg | INTRAVENOUS | Status: DC | PRN
  Administered 2016-06-21 (×2): 2 mg via INTRAVENOUS
  Filled 2016-06-21 (×3): qty 1

## 2016-06-21 MED ORDER — ONDANSETRON HCL 4 MG/2ML IJ SOLN: 4.0000 mg | Freq: Four times a day (QID) | INTRAMUSCULAR | Status: DC | PRN

## 2016-06-21 MED ORDER — PRAZOSIN HCL 5 MG PO CAPS
5.0000 mg | ORAL_CAPSULE | Freq: Every day | ORAL | Status: DC
  Administered 2016-06-21 – 2016-06-23 (×3): 5 mg via ORAL
  Filled 2016-06-21 (×3): qty 1

## 2016-06-21 MED ORDER — HYDROXYZINE HCL 50 MG PO TABS
50.0000 mg | ORAL_TABLET | Freq: Two times a day (BID) | ORAL | Status: DC
  Administered 2016-06-21 – 2016-06-24 (×6): 50 mg via ORAL
  Filled 2016-06-21 (×6): qty 1

## 2016-06-21 MED ORDER — CYCLOBENZAPRINE HCL 10 MG PO TABS: 10.0000 mg | ORAL_TABLET | Freq: Three times a day (TID) | ORAL | Status: DC | PRN

## 2016-06-21 MED ORDER — AZELASTINE HCL 0.1 % NA SOLN
1.0000 | Freq: Two times a day (BID) | NASAL | Status: DC
  Administered 2016-06-21 – 2016-06-24 (×6): 1 via NASAL
  Filled 2016-06-21: qty 30

## 2016-06-21 MED ORDER — ONDANSETRON HCL 4 MG/2ML IJ SOLN
4.0000 mg | Freq: Once | INTRAMUSCULAR | Status: AC
  Administered 2016-06-21: 4 mg via INTRAVENOUS

## 2016-06-21 MED ORDER — BISACODYL 10 MG RE SUPP: 10.0000 mg | Freq: Every day | RECTAL | Status: DC | PRN

## 2016-06-21 MED ORDER — DOCUSATE SODIUM 100 MG PO CAPS
100.0000 mg | ORAL_CAPSULE | Freq: Two times a day (BID) | ORAL | Status: DC
  Administered 2016-06-21 – 2016-06-24 (×6): 100 mg via ORAL
  Filled 2016-06-21 (×6): qty 1

## 2016-06-21 MED ORDER — ACETAMINOPHEN 325 MG PO TABS
ORAL_TABLET | ORAL | Status: AC
  Administered 2016-06-21: 650 mg via ORAL
  Filled 2016-06-21: qty 2

## 2016-06-21 MED ORDER — HEPARIN SODIUM (PORCINE) 5000 UNIT/ML IJ SOLN
5000.0000 [IU] | Freq: Three times a day (TID) | INTRAMUSCULAR | Status: DC
  Administered 2016-06-21 – 2016-06-23 (×6): 5000 [IU] via SUBCUTANEOUS
  Filled 2016-06-21 (×6): qty 1

## 2016-06-21 MED ORDER — LAMOTRIGINE 150 MG PO TABS
300.0000 mg | ORAL_TABLET | Freq: Every day | ORAL | Status: DC
  Administered 2016-06-22 – 2016-06-24 (×3): 300 mg via ORAL
  Filled 2016-06-21 (×3): qty 2

## 2016-06-21 MED ORDER — DIMENHYDRINATE 50 MG PO TABS
50.0000 mg | ORAL_TABLET | Freq: Three times a day (TID) | ORAL | Status: DC | PRN
  Administered 2016-06-24: 50 mg via ORAL
  Filled 2016-06-21 (×2): qty 1

## 2016-06-21 MED ORDER — HYDROMORPHONE HCL 1 MG/ML IJ SOLN
1.0000 mg | INTRAMUSCULAR | Status: DC | PRN
  Administered 2016-06-21 – 2016-06-24 (×19): 1 mg via INTRAVENOUS
  Filled 2016-06-21 (×21): qty 1

## 2016-06-21 MED ORDER — NORTRIPTYLINE HCL 25 MG PO CAPS
100.0000 mg | ORAL_CAPSULE | Freq: Every day | ORAL | Status: DC
  Administered 2016-06-21 – 2016-06-23 (×3): 100 mg via ORAL
  Filled 2016-06-21 (×6): qty 4

## 2016-06-21 MED ORDER — OXYCODONE HCL 5 MG PO TABS
10.0000 mg | ORAL_TABLET | ORAL | Status: DC | PRN
  Administered 2016-06-21 – 2016-06-24 (×8): 10 mg via ORAL
  Filled 2016-06-21 (×8): qty 2

## 2016-06-21 MED ORDER — VANCOMYCIN HCL IN DEXTROSE 1-5 GM/200ML-% IV SOLN
1000.0000 mg | Freq: Two times a day (BID) | INTRAVENOUS | Status: DC
  Administered 2016-06-21 – 2016-06-24 (×6): 1000 mg via INTRAVENOUS
  Filled 2016-06-21 (×8): qty 200

## 2016-06-21 MED ORDER — ZOLPIDEM TARTRATE 5 MG PO TABS
5.0000 mg | ORAL_TABLET | Freq: Every evening | ORAL | Status: DC | PRN
  Administered 2016-06-22 (×2): 5 mg via ORAL
  Filled 2016-06-21 (×3): qty 1

## 2016-06-21 MED ORDER — DILTIAZEM HCL ER COATED BEADS 180 MG PO CP24
180.0000 mg | ORAL_CAPSULE | Freq: Every day | ORAL | Status: DC
  Administered 2016-06-23 – 2016-06-24 (×2): 180 mg via ORAL
  Filled 2016-06-21 (×3): qty 1

## 2016-06-21 MED ORDER — ONDANSETRON HCL 4 MG/2ML IJ SOLN
INTRAMUSCULAR | Status: AC
  Administered 2016-06-21: 4 mg via INTRAVENOUS
  Filled 2016-06-21: qty 2

## 2016-06-21 MED ORDER — PIPERACILLIN-TAZOBACTAM 3.375 G IVPB
3.3750 g | Freq: Three times a day (TID) | INTRAVENOUS | Status: DC
  Administered 2016-06-22 – 2016-06-24 (×8): 3.375 g via INTRAVENOUS
  Filled 2016-06-21 (×8): qty 50

## 2016-06-21 MED ORDER — MORPHINE SULFATE (PF) 4 MG/ML IV SOLN
4.0000 mg | Freq: Once | INTRAVENOUS | Status: AC
  Administered 2016-06-21: 4 mg via INTRAVENOUS

## 2016-06-21 MED ORDER — ONDANSETRON HCL 4 MG PO TABS: 4.0000 mg | ORAL_TABLET | Freq: Four times a day (QID) | ORAL | Status: DC | PRN

## 2016-06-21 MED ORDER — CLONAZEPAM 0.5 MG PO TABS
1.0000 mg | ORAL_TABLET | Freq: Three times a day (TID) | ORAL | Status: DC
  Administered 2016-06-21 – 2016-06-24 (×8): 1 mg via ORAL
  Filled 2016-06-21 (×8): qty 2

## 2016-06-21 MED ORDER — FLUTICASONE PROPIONATE 50 MCG/ACT NA SUSP
1.0000 | Freq: Two times a day (BID) | NASAL | Status: DC
  Administered 2016-06-21 – 2016-06-24 (×6): 1 via NASAL
  Filled 2016-06-21: qty 16

## 2016-06-21 MED ORDER — NICOTINE 21 MG/24HR TD PT24
21.0000 mg | MEDICATED_PATCH | Freq: Every day | TRANSDERMAL | Status: DC
  Administered 2016-06-21 – 2016-06-24 (×4): 21 mg via TRANSDERMAL
  Filled 2016-06-21 (×4): qty 1

## 2016-06-21 MED ORDER — PIPERACILLIN-TAZOBACTAM 3.375 G IVPB 30 MIN
3.3750 g | Freq: Once | INTRAVENOUS | Status: AC
Start: 2016-06-21 — End: 2016-06-21
  Administered 2016-06-21: 3.375 g via INTRAVENOUS
  Filled 2016-06-21: qty 50

## 2016-06-21 MED ORDER — VANCOMYCIN HCL IN DEXTROSE 1-5 GM/200ML-% IV SOLN
1000.0000 mg | Freq: Once | INTRAVENOUS | Status: AC
  Administered 2016-06-21: 1000 mg via INTRAVENOUS
  Filled 2016-06-21: qty 200

## 2016-06-21 MED ORDER — FAMOTIDINE IN NACL 20-0.9 MG/50ML-% IV SOLN
20.0000 mg | Freq: Two times a day (BID) | INTRAVENOUS | Status: DC
Start: 2016-06-21 — End: 2016-06-23
  Administered 2016-06-21 – 2016-06-23 (×4): 20 mg via INTRAVENOUS
  Filled 2016-06-21 (×5): qty 50

## 2016-06-21 MED ORDER — SODIUM CHLORIDE 0.9 % IV BOLUS (SEPSIS): 250.0000 mL | Freq: Once | INTRAVENOUS | Status: DC

## 2016-06-21 MED ORDER — IOPAMIDOL (ISOVUE-300) INJECTION 61%
100.0000 mL | Freq: Once | INTRAVENOUS | Status: AC | PRN
  Administered 2016-06-21: 100 mL via INTRAVENOUS

## 2016-06-21 MED ORDER — MONTELUKAST SODIUM 10 MG PO TABS
10.0000 mg | ORAL_TABLET | Freq: Every day | ORAL | Status: DC
  Administered 2016-06-21 – 2016-06-23 (×3): 10 mg via ORAL
  Filled 2016-06-21 (×3): qty 1

## 2016-06-21 MED ORDER — ACETAMINOPHEN 325 MG PO TABS
650.0000 mg | ORAL_TABLET | Freq: Four times a day (QID) | ORAL | Status: DC | PRN
  Administered 2016-06-22: 650 mg via ORAL
  Filled 2016-06-21: qty 2

## 2016-06-21 MED ORDER — PANTOPRAZOLE SODIUM 40 MG PO TBEC
40.0000 mg | DELAYED_RELEASE_TABLET | ORAL | Status: DC
  Administered 2016-06-22 – 2016-06-24 (×3): 40 mg via ORAL
  Filled 2016-06-21 (×4): qty 1

## 2016-06-21 NOTE — ED Notes (Signed)
Report given to Cambridge SpringsJadie, Charity fundraiserN.

## 2016-06-21 NOTE — ED Notes (Signed)
Pt returned from CT at this time. MD aware of patient's fever, received verbal orders for 650mg  of Tylenol at this time.

## 2016-06-21 NOTE — Clinical Social Work Note (Signed)
Clinical Social Work Assessment  Patient Details  Name: Sabrina Espinoza MRN: [id number] Date of Birth: 1965/11/29  Date of referral:  06/21/16               Reason for consult:  Family Concerns, Emotional/Coping/Adjustment to Illness                Permission sought to share information with:  Family Supports Permission granted to share information::  Yes, Verbal Permission Granted  Name::     Sabrina Espinoza  [phone] Mother  Agency::     Relationship::     Contact Information:     Housing/Transportation Living arrangements for the past 2 months:  Apartment Source of Information:  Patient Patient Interpreter Needed:  None Criminal Activity/Legal Involvement Pertinent to Current Situation/Hospitalization:  No - Comment as needed Significant Relationships:  Adult Children, Parents Lives with:  Self Do you feel safe going back to the place where you live?  Yes Need for family participation in patient care:  Yes (Comment)  Care giving concerns: wants her daughter to be well   Facilities manager / plan: LCSW met with patient and introduced myself. Nurse was in attendance when we began. Verbal consent was given to speak to her mother Sabrina Espinoza) [phone].Patient has come back to ED after she left her room AMA. She has recently had Colostomy surgery, she returned today when it became bloody in the bag and red with rash. Patient is now agreeable to stay and be admitted inpatient for IV treatment. Patient has had [facility] for several years and is treatment by Sabrina Espinoza ( 15 years) and does take her psych meds . Her diagnosis is Bipolar, Personality disorder,PTSD she was verbally aggressive towards staff and her Mother. LCSW provided 1-1 support and listened to her concerns- she is agreeable to try to be nicer and understands she has ability to calmly mention to others her pain or other concerns. Patient was encouraged to call her nurse verses getting out of bed and doing things  herself. She will try but refused to wait 45 minutes to pee. LCSW provided additional support. Patient has medicare/medicaid and will likely discharge home with home health. No further needs   Employment status:  Disabled (Comment on whether or not currently receiving Disability) ([place]- Bipolar) Insurance information:  Medicare, Medicaid In [place] PT Recommendations:  Not assessed at this time Information / Referral to community resources:  Outpatient Psychiatric Care (Comment Required) (Patient follows Sabrina Espinoza for last 15 years/takes her medications)  Patient/Family's Response to care: Glad she is getting antibiotics for her new infection/rather than another surgury  Patient/Family's Understanding of and Emotional Response to Diagnosis, Current Treatment, and Prognosis: She feels bad and hurts and has a lot of pain  Emotional Assessment Appearance:  Appears stated age Attitude/Demeanor/Rapport:  Aggressive (Verbally and/or physically), Combative, Complaining Affect (typically observed):  Agitated, Frustrated Orientation:  Oriented to Self, Oriented to Place, Oriented to  Time, Oriented to Situation Alcohol / Substance use:  Not Applicable Psych involvement (Current and /or in the community):  Outpatient Provider (Sabrina Espinoza for 15 years)  Discharge Needs  Concerns to be addressed:  Care Coordination Readmission within the last 30 days:  Yes Current discharge risk:  None, Psychiatric Illness Barriers to Discharge:  Continued Medical Work up   [name], LCSW 06/21/2016, 3:42 PM

## 2016-06-21 NOTE — ED Triage Notes (Signed)
Pt came to ED via EMS c/o fever, weakness, and redness around incision site. Surgery in October, colostomy bag placed. Seen here yesterday and left AMA. C/o nausea, no vomiting.

## 2016-06-21 NOTE — Progress Notes (Signed)
PT requested information about AD. CH provided brief education and left sticky notes in each section to help with explanations. PT will look at more completely tomorrow. CH advised that CH could be helpful with completing when ready. PT requested prayer.

## 2016-06-21 NOTE — ED Notes (Signed)
This RN at bedside, started 2 IV lines, began 1L NS per Dr. Fransisca ConnorsKinner's instructions, started Zosyn, and assisted patient to the bathroom. Pt is noted to be easily agitated, this RN explained to patient that due to fever she could only have 1 blanket, pt states understanding. Pt requesting this RN dim lights in room, this RN dimmed lights, explained proper use of call bell, and gave patient a remote to the TV at this time. Will continue to monitor for further patient needs.

## 2016-06-21 NOTE — ED Notes (Signed)
Pt taken to CT at this time. Pt noted to have fever 102.3, pt refuses to give up blankets, will notify MD at this time.

## 2016-06-21 NOTE — ED Provider Notes (Signed)
[facility] Emergency Department Provider Note   ____________________________________________    I have reviewed the triage vital signs and the nursing notes.   HISTORY  Chief Complaint Fever and Weakness     HPI Sabrina Espinoza is a 50 y.o. female who presents with complaints of fever, diffuse body aches and rash. Patient was directly admitted by her surgeon yesterday but apparently the patient signed out AMA after a brief period in the hospital. She has a significant psychiatric history and history is difficult to obtain from her. The patient reports the rash has worsened around her colostomy site and spread to her left abdomen   Past Medical History:  Diagnosis Date  . Anxiety   . Chronic constipation    on Linzess, Miralax, Zofran  . Chronic insomnia 11/02/2015  . COPD, moderate (HCC)   . Depression   . Depression with anxiety    Followed [facility] Dr[facility] [name], [facility] and [name] for thrapy  . History of chicken pox   . History of cocaine use   . History of pneumonia   . Hypothyroidism, adult    Working with Dr. Horton ChinShamil [name], Endocrinology. S/P RAIU and is planning on biopsy  . Migraine without status migrainosus, not intractable   . Nicotine dependence, uncomplicated   . Nontoxic multinodular goiter    FNA done 10/30/14 with benign results. Seen [name]. Patient may want a second opinion.  . Rhinitis, allergic   . Vitamin D deficiency     Patient Active Problem List   Diagnosis Date Noted  . Fever 06/20/2016  . Postoperative intra-abdominal abscess (HCC) 06/13/2016  . Diverticulitis large intestine 04/17/2016  . Diverticulitis of colon with perforation   . Pallor 03/11/2016  . Tongue papillae atrophy 03/11/2016  . Encounter for medication monitoring 03/11/2016  . Peripheral neuropathy (HCC) 03/11/2016  . Thrush 03/11/2016  . Primary insomnia 02/04/2016  . Right cervical radiculopathy  01/15/2016  . Chronic insomnia 11/02/2015  . Right knee pain 08/22/2015  . Midline low back pain without sciatica 05/07/2015  . Elevated blood pressure 04/20/2015  . Polypharmacy 03/22/2015  . Foot pain, right 03/16/2015  . Pain pharynx 03/08/2015  . GERD without esophagitis 03/08/2015  . Abnormal mammogram of right breast 03/07/2015  . Degenerative disc disease, cervical 02/26/2015  . Bilateral leg cramps 02/26/2015  . Skin lesions, generalized 02/26/2015  . Allergic rhinitis with postnasal drip 01/31/2015  . Chronic sinusitis with recurrent bronchitis 01/31/2015  . Oral thrush 01/31/2015  . History of cocaine use 01/16/2015  . 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 Chronic hepatitis C without hepatic coma (HCC) 01/16/2015  . Chronic constipation 01/16/2015  . COPD, moderate (HCC) 01/16/2015  . Cigarette smoker 01/16/2015  . Bipolar 1 disorder, mixed, partial remission (HCC) 01/16/2015  . Migraine without aura and without status migrainosus, not intractable 01/16/2015  . Depression with anxiety 01/16/2015  . Cephalalgia 03/06/2014  . Imbalance 03/06/2014  . Insomnia due to medical condition 03/06/2014    Past Surgical History:  Procedure Laterality Date  . ANKLE FRACTURE SURGERY  1988   rods and pins in place  . APPENDECTOMY  04/17/2016   Procedure: APPENDECTOMY;  Surgeon: [name], MD;  Location: [facility] ORS;  Service: General;;  . CESAREAN SECTION  1996  . COLECTOMY WITH COLOSTOMY CREATION/HARTMANN PROCEDURE N/A 04/17/2016   Procedure: COLOSTOMY CREATION/HARTMANN PROCEDURE;  Surgeon: [name], MD;  Location: [facility] ORS;  Service: General;  Laterality: N/A;  .  ECTOPIC PREGNANCY SURGERY  2003  . LAPAROTOMY N/A 04/17/2016   Procedure: EXPLORATORY LAPAROTOMY;  Surgeon: Leafy Ro, MD;  Location: [facility] ORS;  Service: General;  Laterality: N/A;  . NECK SURGERY    . SHOULDER SURGERY      Prior to Admission medications   Medication Sig Start Date End Date Taking?  Authorizing Provider  acetaminophen (TYLENOL) 325 MG tablet Take 2 tablets (650 mg total) by mouth every 6 (six) hours as needed for mild pain or fever. 04/29/16  Yes Henrene Dodge, MD  azelastine (ASTELIN) 0.1 % nasal spray Place 1 spray into the nose 2 (two) times daily. Use in each nostril as directed    Yes Historical Provider, MD  budesonide-formoterol (SYMBICORT) 160-4.5 MCG/ACT inhaler Inhale 2 puffs into the lungs 2 (two) times daily. USE 2 PUFFS 2 TIMES A DAY 11/02/15  Yes Kerman Passey, MD  butalbital-acetaminophen-caffeine (FIORICET, ESGIC) 50-325-40 MG tablet Take 1 tablet by mouth every 4 (four) hours as needed for headache.    Yes Historical Provider, MD  Calcium Carb-Cholecalciferol (CALCIUM CARBONATE-VITAMIN D3) 600-400 MG-UNIT TABS Take 1 tablet by mouth 2 (two) times daily.    Yes Historical Provider, MD  clonazePAM (KLONOPIN) 1 MG tablet Take 1 mg by mouth 3 (three) times daily.  02/09/14  Yes Historical Provider, MD  cyclobenzaprine (FLEXERIL) 10 MG tablet Take 10 mg by mouth 3 (three) times daily as needed for muscle spasms.   Yes Historical Provider, MD  diltiazem (CARDIZEM CD) 180 MG 24 hr capsule Take 1 capsule (180 mg total) by mouth daily. 04/30/16  Yes Henrene Dodge, MD  dimenhyDRINATE (DRAMAMINE) 50 MG tablet Take 50 mg by mouth every 8 (eight) hours as needed.   Yes Historical Provider, MD  EPIPEN 2-PAK 0.3 MG/0.3ML SOAJ injection Inject 0.3 mg into the muscle once.  02/23/15  Yes Historical Provider, MD  fluticasone (FLONASE) 50 MCG/ACT nasal spray Place 1 spray into both nostrils 2 (two) times daily. 12/11/15  Yes Kerman Passey, MD  gabapentin (NEURONTIN) 400 MG capsule Take 400 mg by mouth 3 (three) times daily.  02/09/14  Yes Historical Provider, MD  hydrOXYzine (VISTARIL) 50 MG capsule Take 50 mg by mouth 2 (two) times daily.   Yes Historical Provider, MD  ibuprofen (ADVIL,MOTRIN) 600 MG tablet Take 1 tablet (600 mg total) by mouth every 8 (eight) hours as needed for fever or  mild pain. 04/29/16  Yes Henrene Dodge, MD  lamoTRIgine (LAMICTAL) 150 MG tablet Take 300 mg by mouth daily.   Yes Historical Provider, MD  montelukast (SINGULAIR) 10 MG tablet TAKE 1 TABLET (10 MG TOTAL) BY MOUTH AT BEDTIME. 01/23/16  Yes Kerman Passey, MD  mupirocin ointment (BACTROBAN) 2 % Apply to affected area 3 times daily 05/01/16 05/01/17 Yes Emily Filbert, MD  nicotine (NICODERM CQ - DOSED IN MG/24 HOURS) 21 mg/24hr patch Place 1 patch (21 mg total) onto the skin daily. 04/30/16  Yes Henrene Dodge, MD  nortriptyline (PAMELOR) 50 MG capsule Take 100 mg by mouth at bedtime.    Yes Historical Provider, MD  ondansetron (ZOFRAN) 4 MG tablet Take 4 mg by mouth every 6 (six) hours as needed for nausea or vomiting.   Yes Historical Provider, MD  oxyCODONE 10 MG TABS Take 1 tablet (10 mg total) by mouth every 4 (four) hours as needed for moderate pain or breakthrough pain. 04/29/16  Yes Henrene Dodge, MD  pantoprazole (PROTONIX) 40 MG tablet Take 1 tablet (40 mg total) by  mouth daily as needed. Patient taking differently: Take 40 mg by mouth every morning.  03/11/16  Yes Kerman Passey, MD  polyethylene glycol powder (GLYCOLAX/MIRALAX) powder Take 17 g by mouth daily as needed for mild constipation. 04/17/16  Yes Kerman Passey, MD  prazosin (MINIPRESS) 5 MG capsule Take 5 mg by mouth at bedtime.   Yes Historical Provider, MD  promethazine (PHENERGAN) 25 MG tablet Take 25 mg by mouth every 6 (six) hours as needed for nausea or vomiting.   Yes Historical Provider, MD  VENTOLIN HFA 108 (90 Base) MCG/ACT inhaler Inhale 2 puffs into the lungs every 4 (four) hours as needed for wheezing or shortness of breath. 04/18/16  Yes Kerman Passey, MD  zaleplon (SONATA) 10 MG capsule Take 10 mg by mouth at bedtime.  08/02/15  Yes Historical Provider, MD  cefUROXime (CEFTIN) 250 MG tablet Take 1 tablet (250 mg total) by mouth 2 (two) times daily with a meal. 06/20/16   Altamese Dilling, MD      Allergies Patient has no known allergies.  Family History  Problem Relation Age of Onset  . Heart disease Father   . Alcohol abuse Father   . Diabetes Father   . Heart disease Brother   . Depression Brother   . Stroke Brother   . Alcohol abuse Paternal Grandfather   . Osteoporosis Mother   . Breast cancer Neg Hx     Social History Social History  Substance Use Topics  . Smoking status: Current Every Day Smoker    Packs/day: 0.75    Years: 32.00    Types: Cigarettes  . Smokeless tobacco: Never Used  . Alcohol use No    Review of Systems  Constitutional: Reports chills and body aches Eyes: No visual changes.   Cardiovascular: Denies chest pain. Respiratory: Denies cough or shortness of breath Gastrointestinal: Denies vomiting Genitourinary: Negative for dysuria. Musculoskeletal: Negative for back pain. Skin: Redness to the abdominal wall Neurological: Negative for headaches    ____________________________________________   PHYSICAL EXAM:  VITAL SIGNS: ED Triage Vitals  Enc Vitals Group     BP 06/21/16 1055 123/90     Pulse Rate 06/21/16 1055 (!) 110     Resp 06/21/16 1055 16     Temp 06/21/16 1055 100 F (37.8 C)     Temp Source 06/21/16 1055 Oral     SpO2 06/21/16 1055 100 %     Weight 06/21/16 1051 165 lb (74.8 kg)     Height 06/21/16 1051 5\' 9"  (1.753 m)     Head Circumference --      Peak Flow --      Pain Score --      Pain Loc --      Pain Edu? --      Excl. in GC? --     Constitutional: Alert and oriented. No acute distress Eyes: Conjunctivae are normal.  Ears: No swelling or redness of the ears, tympanic membrane appears normal bilaterally Nose: No congestion/rhinnorhea. Mouth/Throat: Mucous membranes are moist.    Cardiovascular: Tachycardia, regular rhythm. Grossly normal heart sounds.  Good peripheral circulation. Respiratory: Normal respiratory effort.  No retractions. Lungs CTAB. Gastrointestinal: Soft and nontender.   Genitourinary: deferred Musculoskeletal: No lower extremity tenderness nor edema.  Warm and well perfused Neurologic:  No gross focal neurologic deficits are appreciated.  Skin:  Skin is warm, dry. Patient with significant erythema surrounding healing abdominal incision with erythema spreading towards and around colostomy site, consistent with cellulitis  Psychiatric: Anxious with unusual affect. Speech is difficult to follow  ____________________________________________   LABS (all labs ordered are listed, but only abnormal results are displayed)  Labs Reviewed  CULTURE, BLOOD (ROUTINE X 2)  CULTURE, BLOOD (ROUTINE X 2)  URINE CULTURE  LACTIC ACID, PLASMA  LACTIC ACID, PLASMA  COMPREHENSIVE METABOLIC PANEL  CBC WITH DIFFERENTIAL/PLATELET  URINALYSIS, ROUTINE W REFLEX MICROSCOPIC   ____________________________________________  EKG  None ____________________________________________  RADIOLOGY  CT abdomen pelvis does not show intra-abdominal infection ____________________________________________   PROCEDURES  Procedure(s) performed: No    Critical Care performed: No ____________________________________________   INITIAL IMPRESSION / ASSESSMENT AND PLAN / ED COURSE  Pertinent labs & imaging results that were available during my care of the patient were reviewed by me and considered in my medical decision making (see chart for details).  Patient presents with cellulitis of the abdominal wall, also significant concern about possible intra-abdominal infection, CT abdomen pelvis ordered. Patient is febrile and tachycardic. Code sepsis called, patient also with a UTI diagnosed via a UA yesterday  Clinical Course   ----------------------------------------- 2:18 PM on 06/21/2016 -----------------------------------------  Patient treated with IV vancomycin and Zosyn, suspect abdominal wall cellulitis of the cause of her sepsis although certainly could be her urinary  tract infection as well. Discussed patient with Dr. Orvis BrillLoflin of surgery who stated she would be happy to consult but recommends admission to medicine ____________________________________________   FINAL CLINICAL IMPRESSION(S) / ED DIAGNOSES  Final diagnoses:  Sepsis, due to unspecified organism [facility](HCC)  Acute cystitis without hematuria  Cellulitis of abdominal wall      NEW MEDICATIONS STARTED DURING THIS VISIT:  New Prescriptions   No medications on file     Note:  This document was prepared using Dragon voice recognition software and may include unintentional dictation errors.    Jene Everyobert Yekaterina Escutia, MD 06/21/16 [phone]

## 2016-06-21 NOTE — Progress Notes (Signed)
Pharmacy Antibiotic Note  Sabrina Espinoza is a 50 y.o. female admitted on 06/21/2016 with cellulitis.  Pharmacy has been consulted for vancomycin and Zosyn  Dosing. Patient received 1 dose Zosyn and Vancomycin IV in ED.   Plan: Ke: 0.079   T1/2: 8.8   Vd: 53     Will start the patient on Vancomycin 1gm IV every 12hours with 6 hours stack dosing. Trough level drawn prior to 5th dose.  Goal trough: 10-15  Will start patient on Zosyn 3.375 IV EI every 8 hours.    Height: 5\' 9"  (175.3 cm) Weight: 165 lb (74.8 kg) IBW/kg (Calculated) : 66.2  Temp (24hrs), Avg:101.9 F (38.8 C), Min:100 F (37.8 C), Max:103.4 F (39.7 C)   Recent Labs Lab 06/20/16 1612 06/21/16 1126  WBC 15.6* 15.7*  CREATININE 0.92 0.75  LATICACIDVEN  --  1.5    Estimated Creatinine Clearance: 90.9 mL/min (by C-G formula based on SCr of 0.75 mg/dL).    No Known Allergies  Antimicrobials this admission: 12/16 Vancomycin  >>  12/16 Zosyn  >>   Dose adjustments this admission:  Microbiology results: 12/16 BCx: UCx:  MRSA PCR:   Thank you for allowing pharmacy to be a part of this patient's care.  Gardner CandleSheema M Caley Ciaramitaro, PharmD, BCPS Clinical Pharmacist 06/21/2016 3:09 PM

## 2016-06-21 NOTE — H&P (Signed)
History and Physical    Sabrina Espinoza ZOX:[id number] DOB: 1965/08/02 DOA: 06/21/2016  Referring physician: Dr. Cyril LoosenKinner PCP: Domenic SchwabLindley,Cheryl Paulette, FNP  Specialists: none  Chief Complaint: fever with abdominal pain  HPI: Sabrina Espinoza is a 50 y.o. female has a past medical history significant for bipolar D/O, colonic perforation requiring colostomy, and COPD who left [facility] yesterday AMA, presents to ER today with abdominal pain and fever . In the ER, the pt was tachycardic and febrile. WBC elevated. UA groassly abnormal and exam reveals severe/diffuse abdominal wall cellulitis. She is now admitted. Due to her psych issues, the patient is unable to provide an accurate hx.  Review of Systems: unable to obtain due to underlying psychiatric condition  Past Medical History:  Diagnosis Date  . Anxiety   . Chronic constipation    on Linzess, Miralax, Zofran  . Chronic insomnia 11/02/2015  . COPD, moderate (HCC)   . Depression   . Depression with anxiety    Followed by Dr. [name][facility] and [name] for thrapy  . History of chicken pox   . History of cocaine use   . History of pneumonia   . Hypothyroidism, adult    Working with Dr. Horton ChinShamil [name], Endocrinology. S/P RAIU and is planning on biopsy  . Migraine without status migrainosus, not intractable   . Nicotine dependence, uncomplicated   . Nontoxic multinodular goiter    FNA done 10/30/14 with benign results. Seen [name]. Patient may want a second opinion.  . Rhinitis, allergic   . Vitamin D deficiency    Past Surgical History:  Procedure Laterality Date  . ANKLE FRACTURE SURGERY  1988   rods and pins in place  . APPENDECTOMY  04/17/2016   Procedure: APPENDECTOMY;  Surgeon: [name], MD;  Location: [facility] ORS;  Service: General;;  . CESAREAN SECTION  1996  . COLECTOMY WITH COLOSTOMY CREATION/HARTMANN PROCEDURE N/A 04/17/2016   Procedure: COLOSTOMY CREATION/HARTMANN  PROCEDURE;  Surgeon: [name], MD;  Location: [facility] ORS;  Service: General;  Laterality: N/A;  . ECTOPIC PREGNANCY SURGERY  2003  . LAPAROTOMY N/A 04/17/2016   Procedure: EXPLORATORY LAPAROTOMY;  Surgeon: [name], MD;  Location: [facility] ORS;  Service: General;  Laterality: N/A;  . NECK SURGERY    . SHOULDER SURGERY     Social History:  reports that she has been smoking Cigarettes.  She has a 24.00 pack-year smoking history. She has never used smokeless tobacco. She reports that she does not drink alcohol or use drugs.  No Known Allergies  Family History  Problem Relation Age of Onset  . Heart disease Father   . Alcohol abuse Father   . Diabetes Father   . Heart disease Brother   . Depression Brother   . Stroke Brother   . Alcohol abuse Paternal Grandfather   . Osteoporosis Mother   . Breast cancer Neg Hx     Prior to Admission medications   Medication Sig Start Date End Date Taking? Authorizing Provider  acetaminophen (TYLENOL) 325 MG tablet Take 2 tablets (650 mg total) by mouth every 6 (six) hours as needed for mild pain or fever. 04/29/16  Yes Henrene DodgeJose Piscoya, MD  azelastine (ASTELIN) 0.1 % nasal spray Place 1 spray into the nose 2 (two) times daily. Use in each nostril as directed    Yes Historical Provider, MD  budesonide-formoterol (SYMBICORT) 160-4.5 MCG/ACT inhaler Inhale 2 puffs into the lungs 2 (two) times daily. USE 2  PUFFS 2 TIMES A DAY 11/02/15  Yes Kerman PasseyMelinda P Lada, MD  butalbital-acetaminophen-caffeine (FIORICET, ESGIC) 50-325-40 MG tablet Take 1 tablet by mouth every 4 (four) hours as needed for headache.    Yes Historical Provider, MD  Calcium Carb-Cholecalciferol (CALCIUM CARBONATE-VITAMIN D3) 600-400 MG-UNIT TABS Take 1 tablet by mouth 2 (two) times daily.    Yes Historical Provider, MD  clonazePAM (KLONOPIN) 1 MG tablet Take 1 mg by mouth 3 (three) times daily.  02/09/14  Yes Historical Provider, MD  cyclobenzaprine (FLEXERIL) 10 MG tablet Take 10 mg by mouth 3  (three) times daily as needed for muscle spasms.   Yes Historical Provider, MD  diltiazem (CARDIZEM CD) 180 MG 24 hr capsule Take 1 capsule (180 mg total) by mouth daily. 04/30/16  Yes Henrene DodgeJose Piscoya, MD  dimenhyDRINATE (DRAMAMINE) 50 MG tablet Take 50 mg by mouth every 8 (eight) hours as needed.   Yes Historical Provider, MD  EPIPEN 2-PAK 0.3 MG/0.3ML SOAJ injection Inject 0.3 mg into the muscle once.  02/23/15  Yes Historical Provider, MD  fluticasone (FLONASE) 50 MCG/ACT nasal spray Place 1 spray into both nostrils 2 (two) times daily. 12/11/15  Yes Kerman PasseyMelinda P Lada, MD  gabapentin (NEURONTIN) 400 MG capsule Take 400 mg by mouth 3 (three) times daily.  02/09/14  Yes Historical Provider, MD  hydrOXYzine (VISTARIL) 50 MG capsule Take 50 mg by mouth 2 (two) times daily.   Yes Historical Provider, MD  ibuprofen (ADVIL,MOTRIN) 600 MG tablet Take 1 tablet (600 mg total) by mouth every 8 (eight) hours as needed for fever or mild pain. 04/29/16  Yes Henrene DodgeJose Piscoya, MD  lamoTRIgine (LAMICTAL) 150 MG tablet Take 300 mg by mouth daily.   Yes Historical Provider, MD  montelukast (SINGULAIR) 10 MG tablet TAKE 1 TABLET (10 MG TOTAL) BY MOUTH AT BEDTIME. 01/23/16  Yes Kerman PasseyMelinda P Lada, MD  mupirocin ointment (BACTROBAN) 2 % Apply to affected area 3 times daily 05/01/16 05/01/17 Yes Emily FilbertJonathan E Williams, MD  nicotine (NICODERM CQ - DOSED IN MG/24 HOURS) 21 mg/24hr patch Place 1 patch (21 mg total) onto the skin daily. 04/30/16  Yes Henrene DodgeJose Piscoya, MD  nortriptyline (PAMELOR) 50 MG capsule Take 100 mg by mouth at bedtime.    Yes Historical Provider, MD  ondansetron (ZOFRAN) 4 MG tablet Take 4 mg by mouth every 6 (six) hours as needed for nausea or vomiting.   Yes Historical Provider, MD  oxyCODONE 10 MG TABS Take 1 tablet (10 mg total) by mouth every 4 (four) hours as needed for moderate pain or breakthrough pain. 04/29/16  Yes Henrene DodgeJose Piscoya, MD  pantoprazole (PROTONIX) 40 MG tablet Take 1 tablet (40 mg total) by mouth daily as  needed. Patient taking differently: Take 40 mg by mouth every morning.  03/11/16  Yes Kerman PasseyMelinda P Lada, MD  polyethylene glycol powder (GLYCOLAX/MIRALAX) powder Take 17 g by mouth daily as needed for mild constipation. 04/17/16  Yes Kerman PasseyMelinda P Lada, MD  prazosin (MINIPRESS) 5 MG capsule Take 5 mg by mouth at bedtime.   Yes Historical Provider, MD  promethazine (PHENERGAN) 25 MG tablet Take 25 mg by mouth every 6 (six) hours as needed for nausea or vomiting.   Yes Historical Provider, MD  VENTOLIN HFA 108 (90 Base) MCG/ACT inhaler Inhale 2 puffs into the lungs every 4 (four) hours as needed for wheezing or shortness of breath. 04/18/16  Yes Kerman PasseyMelinda P Lada, MD  zaleplon (SONATA) 10 MG capsule Take 10 mg by mouth at bedtime.  08/02/15  Yes Historical Provider, MD  cefUROXime (CEFTIN) 250 MG tablet Take 1 tablet (250 mg total) by mouth 2 (two) times daily with a meal. 06/20/16   Altamese Dilling, MD   Physical Exam: Vitals:   06/21/16 1310 06/21/16 1330 06/21/16 1344 06/21/16 1405  BP: (!) 142/101 (!) 143/108  (!) 139/96  Pulse: (!) 113 (!) 113  (!) 114  Resp: (!) 23 14  15   Temp:   (!) 102.3 F (39.1 C)   TempSrc:   Oral   SpO2: 100% 100%  100%  Weight:      Height:         General:  WDWN, [place]/AT, in moderate distress  Eyes: PERRL, EOMI, no scleral icterus, conjunctiva clear  ENT: moist oropharynx without exudate, TM's benign, dentition fair  Neck: supple, no lymphadenopathy. No bruits or thyromegaly  Cardiovascular: rapid rate with regular rhythm without MRG; 2+ peripheral pulses, no JVD, no peripheral edema  Respiratory: CTA biL, good air movement without wheezing, rhonchi or crackled. Respiratory effort normal  Abdomen: soft, diffusely tender to palpation, positive bowel sounds, positive guarding, no rebound. No obvious masses  Skin: diffuse erythema with warmth to the abdominal wall covering 60% of the abdomen  Musculoskeletal: normal bulk and tone, no joint  swelling  Psychiatric: alert, oriented to person, anxious  Neurologic: CN 2-12 grossly intact, Motor strength 5/5 in all 4 groups with symmetric DTR's and non-focal sensory exam  Labs on Admission:  Basic Metabolic Panel:  Recent Labs Lab 06/20/16 1612 06/21/16 1126  NA 133* 135  K 3.2* 3.0*  CL 102 102  CO2 23 22  GLUCOSE 85 80  BUN 8 7  CREATININE 0.92 0.75  CALCIUM 8.7* 8.8*  MG 1.4*  --   PHOS 2.3*  --    Liver Function Tests:  Recent Labs Lab 06/20/16 1612 06/21/16 1126  AST 32 40  ALT 17 25  ALKPHOS 90 107  BILITOT 0.4 0.4  PROT 7.2 7.9  ALBUMIN 3.5 3.7   No results for input(s): LIPASE, AMYLASE in the last 168 hours. No results for input(s): AMMONIA in the last 168 hours. CBC:  Recent Labs Lab 06/20/16 1612 06/21/16 1126  WBC 15.6* 15.7*  NEUTROABS  --  11.8*  HGB 11.0* 11.5*  HCT 32.7* 34.0*  MCV 83.7 84.5  PLT 417 382   Cardiac Enzymes: No results for input(s): CKTOTAL, CKMB, CKMBINDEX, TROPONINI in the last 168 hours.  BNP (last 3 results) No results for input(s): BNP in the last 8760 hours.  ProBNP (last 3 results) No results for input(s): PROBNP in the last 8760 hours.  CBG: No results for input(s): GLUCAP in the last 168 hours.  Radiological Exams on Admission: Ct Abdomen Pelvis W Contrast  Result Date: 06/21/2016 CLINICAL DATA:  Fever, weakness, redness around the incision. Colostomy surgery in October EXAM: CT ABDOMEN AND PELVIS WITH CONTRAST TECHNIQUE: Multidetector CT imaging of the abdomen and pelvis was performed using the standard protocol following bolus administration of intravenous contrast. CONTRAST:  ISOVUE-300 IOPAMIDOL (ISOVUE-300) INJECTION 61% COMPARISON:  04/25/2016 FINDINGS: Lower chest: Lung bases are clear. No effusions. Heart is normal size. Hepatobiliary: No focal hepatic abnormality. Gallbladder unremarkable. Pancreas: No focal abnormality or ductal dilatation. Spleen: No focal abnormality.  Normal size.  Adrenals/Urinary Tract: No adrenal abnormality. No focal renal abnormality. No stones or hydronephrosis. Urinary bladder is unremarkable. Stomach/Bowel: Surgical changes from partial colectomy with left lower quadrant ostomy. Herniated small bowel loops noted in the left lower quadrant through the  ostomy defect. This is new since prior study. No evidence of bowel obstruction. Stomach unremarkable. Vascular/Lymphatic: Moderate aortic and iliac calcifications. No aneurysm. No adenopathy. Reproductive: Uterus and adnexa unremarkable.  No mass. Other: No free fluid or free air. Musculoskeletal: No acute bony abnormality or focal bone lesion. IMPRESSION: Left lower quadrant ostomy noted. Herniation of multiple small bowel loops through the ostomy defect without bowel obstruction. Electronically Signed   By: Charlett Nose M.D.   On: 06/21/2016 14:03   Dg Chest Port 1 View  Result Date: 06/21/2016 CLINICAL DATA:  Fever, weakness EXAM: PORTABLE CHEST 1 VIEW COMPARISON:  04/21/2016 FINDINGS: Heart and mediastinal contours are within normal limits. No focal opacities or effusions. No acute bony abnormality. IMPRESSION: No active disease. Electronically Signed   By: Charlett Nose M.D.   On: 06/21/2016 11:35    EKG: Independently reviewed.  Assessment/Plan Principal Problem:   Sepsis (HCC) Active Problems:   Abdominal pain   Cellulitis of abdominal wall   UTI (urinary tract infection)   Cultures sent. Will admit to floor with IV fluids and IV ABX. Consult Surgery who had her in the hospital yesterday. IV morphine for pain as needed. Consult PT and CSW for possible placement. Repeat labs in AM.  Diet: clear liquids Fluids: NS with K+@100  DVT Prophylaxis: SQ Heparin  Code Status: FULL  Family Communication: none  Disposition Plan: SNF  Time spent: 55 min

## 2016-06-21 NOTE — ED Notes (Signed)
This RN to bedside, pt states she needs something for pain and nausea and is unable to drink contrast due to nausea. Pt continues to call out, be verbally aggressive with this RN. Spoke with MD, received verbal orders for 4mg  Morphine and 4mg  Zofran at this time.

## 2016-06-21 NOTE — ED Notes (Signed)
This RN placed patient on bedpan at this time. Pt's mom at bedside at this time. Will continue to monitor for further patient needs.

## 2016-06-21 NOTE — H&P (Signed)
Sabrina Espinoza is an 50 y.o. female.   Chief Complaint: fever chills, redness of abdominal wall HPI: 50 yr old female with multiple medical issues to include case. A significant psych history of bipolar disorder, personality disorder PTSD, COPD, migraines and substance abuse. The patient was admitted directly from the surgical clinic due to her fever being 103 at that time however she was uncooperative and would not stay to complete any treatment and left AMA.  The patient had a exploratory laparotomy with Hartman's procedure and colostomy placement done on 10/12 by Dr. Dahlia Byes for perforated diverticulitis with purulent peritonitis. The patient has had off and on issues with this and reportedly had redness of the abdominal wall couple of weeks ago as well.  Sensation states that this has been going on for a couple of days and increasingly getting worse. I was unable to examine her yesterday her agitated state however my partner Dr. Dahlia Byes did. The patient does have a reducible parastomal hernia in this area as well. The patient has been having ostomy output but only had 1 small stool today. She states that occasionally she does have to take something to help stools. She also reports having some bloody drainage around the ostomy site. She additionally has had fevers and chills but denies any pain or burning with urination.  Past Medical History:  Diagnosis Date  . Anxiety   . Chronic constipation    on Linzess, Miralax, Zofran  . Chronic insomnia 11/02/2015  . COPD, moderate ([place])   . Depression   . Depression with anxiety    Followed by Dr. Myer Haff, psychiatrist at [facility] and [facility] for [place]  . History of chicken pox   . History of cocaine use   . History of pneumonia   . Hypothyroidism, adult    Working with Dr. Belinda Fisher, Endocrinology. S/P RAIU and is planning on biopsy  . Migraine without status migrainosus, not intractable   . Nicotine dependence,  uncomplicated   . Nontoxic multinodular goiter    FNA done 10/30/14 with benign results. Seen [name]. Patient may want a second opinion.  . Rhinitis, allergic   . Vitamin D deficiency     Past Surgical History:  Procedure Laterality Date  . ANKLE FRACTURE SURGERY  1988   rods and pins in place  . APPENDECTOMY  04/17/2016   Procedure: APPENDECTOMY;  Surgeon: Jules Husbands, MD;  Location: [facility] ORS;  Service: General;;  . [place]  . COLECTOMY WITH COLOSTOMY CREATION/HARTMANN PROCEDURE N/A 04/17/2016   Procedure: COLOSTOMY CREATION/HARTMANN PROCEDURE;  Surgeon: Jules Husbands, MD;  Location: [facility] ORS;  Service: General;  Laterality: N/A;  . ECTOPIC PREGNANCY SURGERY  2003  . LAPAROTOMY N/A 04/17/2016   Procedure: EXPLORATORY LAPAROTOMY;  Surgeon: Jules Husbands, MD;  Location: [facility] ORS;  Service: General;  Laterality: N/A;  . NECK SURGERY    . SHOULDER SURGERY      Family History  Problem Relation Age of Onset  . Heart disease Father   . Alcohol abuse Father   . Diabetes Father   . Heart disease Brother   . Depression Brother   . Stroke Brother   . Alcohol abuse Paternal Grandfather   . Osteoporosis Mother   . Breast cancer Neg Hx    Social History:  reports that she has been smoking Cigarettes.  She has a 24.00 pack-year smoking history. She has never used smokeless tobacco. She reports that she does not drink  alcohol or use drugs.  Allergies: No Known Allergies  Medications Prior to Admission  Medication Sig Dispense Refill  . acetaminophen (TYLENOL) 325 MG tablet Take 2 tablets (650 mg total) by mouth every 6 (six) hours as needed for mild pain or fever. 30 tablet 0  . azelastine (ASTELIN) 0.1 % nasal spray Place 1 spray into the nose 2 (two) times daily. Use in each nostril as directed     . budesonide-formoterol (SYMBICORT) 160-4.5 MCG/ACT inhaler Inhale 2 puffs into the lungs 2 (two) times daily. USE 2 PUFFS 2 TIMES A DAY 10.2 Inhaler 2  .  butalbital-acetaminophen-caffeine (FIORICET, ESGIC) 50-325-40 MG tablet Take 1 tablet by mouth every 4 (four) hours as needed for headache.     . Calcium Carb-Cholecalciferol (CALCIUM CARBONATE-VITAMIN D3) 600-400 MG-UNIT TABS Take 1 tablet by mouth 2 (two) times daily.     . clonazePAM (KLONOPIN) 1 MG tablet Take 1 mg by mouth 3 (three) times daily.     . cyclobenzaprine (FLEXERIL) 10 MG tablet Take 10 mg by mouth 3 (three) times daily as needed for muscle spasms.    [name] diltiazem (CARDIZEM CD) 180 MG 24 hr capsule Take 1 capsule (180 mg total) by mouth daily. 30 capsule 1  . dimenhyDRINATE (DRAMAMINE) 50 MG tablet Take 50 mg by mouth every 8 (eight) hours as needed.    [name] EPIPEN 2-PAK 0.3 MG/0.3ML SOAJ injection Inject 0.3 mg into the muscle once.     . fluticasone (FLONASE) 50 MCG/ACT nasal spray Place 1 spray into both nostrils 2 (two) times daily. 16 g 11  . gabapentin (NEURONTIN) 400 MG capsule Take 400 mg by mouth 3 (three) times daily.     . hydrOXYzine (VISTARIL) 50 MG capsule Take 50 mg by mouth 2 (two) times daily.    [name] ibuprofen (ADVIL,MOTRIN) 600 MG tablet Take 1 tablet (600 mg total) by mouth every 8 (eight) hours as needed for fever or mild pain. 30 tablet 0  . lamoTRIgine (LAMICTAL) 150 MG tablet Take 300 mg by mouth daily.    . montelukast (SINGULAIR) 10 MG tablet TAKE 1 TABLET (10 MG TOTAL) BY MOUTH AT BEDTIME. 30 tablet 6  . mupirocin ointment (BACTROBAN) 2 % Apply to affected area 3 times daily 22 g 0  . nicotine (NICODERM CQ - DOSED IN MG/24 HOURS) 21 mg/24hr patch Place 1 patch (21 mg total) onto the skin daily. 28 patch 0  . nortriptyline (PAMELOR) 50 MG capsule Take 100 mg by mouth at bedtime.     . ondansetron (ZOFRAN) 4 MG tablet Take 4 mg by mouth every 6 (six) hours as needed for nausea or vomiting.    [name] oxyCODONE 10 MG TABS Take 1 tablet (10 mg total) by mouth every 4 (four) hours as needed for moderate pain or breakthrough pain. 30 tablet 0  . pantoprazole (PROTONIX) 40  MG tablet Take 1 tablet (40 mg total) by mouth daily as needed. (Patient taking differently: Take 40 mg by mouth every morning. ) 30 tablet 2  . polyethylene glycol powder (GLYCOLAX/MIRALAX) powder Take 17 g by mouth daily as needed for mild constipation. 527 g 2  . prazosin (MINIPRESS) 5 MG capsule Take 5 mg by mouth at bedtime.    . promethazine (PHENERGAN) 25 MG tablet Take 25 mg by mouth every 6 (six) hours as needed for nausea or vomiting.    . VENTOLIN HFA 108 (90 Base) MCG/ACT inhaler Inhale 2 puffs into the lungs every 4 (four) hours as  needed for wheezing or shortness of breath. 1 Inhaler 1  . zaleplon (SONATA) 10 MG capsule Take 10 mg by mouth at bedtime.     . cefUROXime (CEFTIN) 250 MG tablet Take 1 tablet (250 mg total) by mouth 2 (two) times daily with a meal. 10 tablet 0    Results for orders placed or performed during the hospital encounter of 06/21/16 (from the past 48 hour(s))  Lactic acid, plasma     Status: None   Collection Time: 06/21/16 11:26 AM  Result Value Ref Range   Lactic Acid, Venous 1.5 0.5 - 1.9 mmol/L  Comprehensive metabolic panel     Status: Abnormal   Collection Time: 06/21/16 11:26 AM  Result Value Ref Range   Sodium 135 135 - 145 mmol/L   Potassium 3.0 (L) 3.5 - 5.1 mmol/L   Chloride 102 101 - 111 mmol/L   CO2 22 22 - 32 mmol/L   Glucose, Bld 80 65 - 99 mg/dL   BUN 7 6 - 20 mg/dL   Creatinine, Ser 0.75 0.44 - 1.00 mg/dL   Calcium 8.8 (L) 8.9 - 10.3 mg/dL   Total Protein 7.9 6.5 - 8.1 g/dL   Albumin 3.7 3.5 - 5.0 g/dL   AST 40 15 - 41 U/L   ALT 25 14 - 54 U/L   Alkaline Phosphatase 107 38 - 126 U/L   Total Bilirubin 0.4 0.3 - 1.2 mg/dL   GFR calc non Af Amer >60 >60 mL/min   GFR calc Af Amer >60 >60 mL/min    Comment: (NOTE) The eGFR has been calculated using the CKD EPI equation. This calculation has not been validated in all clinical situations. eGFR's persistently <60 mL/min signify possible Chronic Kidney Disease.    Anion gap 11 5 - 15   CBC WITH DIFFERENTIAL     Status: Abnormal   Collection Time: 06/21/16 11:26 AM  Result Value Ref Range   WBC 15.7 (H) 3.6 - 11.0 K/uL   RBC 4.02 3.80 - 5.20 MIL/uL   Hemoglobin 11.5 (L) 12.0 - 16.0 g/dL   HCT 34.0 (L) 35.0 - 47.0 %   MCV 84.5 80.0 - 100.0 fL   MCH 28.6 26.0 - 34.0 pg   MCHC 33.9 32.0 - 36.0 g/dL   RDW 15.9 (H) 11.5 - 14.5 %   Platelets 382 150 - 440 K/uL   Neutrophils Relative % 75 %   Neutro Abs 11.8 (H) 1.4 - 6.5 K/uL   Lymphocytes Relative 18 %   Lymphs Abs 2.8 1.0 - 3.6 K/uL   Monocytes Relative 6 %   Monocytes Absolute 1.0 (H) 0.2 - 0.9 K/uL   Eosinophils Relative 0 %   Eosinophils Absolute 0.1 0 - 0.7 K/uL   Basophils Relative 1 %   Basophils Absolute 0.1 0 - 0.1 K/uL  Blood Culture (routine x 2)     Status: None (Preliminary result)   Collection Time: 06/21/16 11:27 AM  Result Value Ref Range   Specimen Description BLOOD left ac    Special Requests BOTTLES DRAWN AEROBIC AND ANAEROBIC [place]    Culture NO GROWTH <12 HOURS    Report Status PENDING   Blood Culture (routine x 2)     Status: None (Preliminary result)   Collection Time: 06/21/16 11:27 AM  Result Value Ref Range   Specimen Description BLOOD RIGHT FOREARM    Special Requests BOTTLES DRAWN AEROBIC AND ANAEROBIC [place]    Culture NO GROWTH <12 HOURS    Report Status  PENDING   Urinalysis, Routine w reflex microscopic     Status: Abnormal   Collection Time: 06/21/16 11:27 AM  Result Value Ref Range   Color, Urine YELLOW (A) YELLOW   APPearance CLEAR (A) CLEAR   Specific Gravity, Urine 1.005 1.005 - 1.030   pH 8.0 5.0 - 8.0   Glucose, UA NEGATIVE NEGATIVE mg/dL   Hgb urine dipstick SMALL (A) NEGATIVE   Bilirubin Urine NEGATIVE NEGATIVE   Ketones, ur NEGATIVE NEGATIVE mg/dL   Protein, ur NEGATIVE NEGATIVE mg/dL   Nitrite NEGATIVE NEGATIVE   Leukocytes, UA LARGE (A) NEGATIVE   RBC / HPF TOO NUMEROUS TO COUNT 0 - 5 RBC/hpf   WBC, UA TOO NUMEROUS TO COUNT 0 - 5 WBC/hpf   Bacteria, UA RARE (A)  NONE SEEN   Squamous Epithelial / LPF 0-5 (A) NONE SEEN   Mucous PRESENT    Ct Abdomen Pelvis W Contrast  Result Date: 06/21/2016 CLINICAL DATA:  Fever, weakness, redness around the incision. Colostomy surgery in October EXAM: CT ABDOMEN AND PELVIS WITH CONTRAST TECHNIQUE: Multidetector CT imaging of the abdomen and pelvis was performed using the standard protocol following bolus administration of intravenous contrast. CONTRAST:  151m ISOVUE-300 IOPAMIDOL (ISOVUE-300) INJECTION 61% COMPARISON:  04/25/2016 FINDINGS: Lower chest: Lung bases are clear. No effusions. Heart is normal size. Hepatobiliary: No focal hepatic abnormality. Gallbladder unremarkable. Pancreas: No focal abnormality or ductal dilatation. Spleen: No focal abnormality.  Normal size. Adrenals/Urinary Tract: No adrenal abnormality. No focal renal abnormality. No stones or hydronephrosis. Urinary bladder is unremarkable. Stomach/Bowel: Surgical changes from partial colectomy with left lower quadrant ostomy. Herniated small bowel loops noted in the left lower quadrant through the ostomy defect. This is new since prior study. No evidence of bowel obstruction. Stomach unremarkable. Vascular/Lymphatic: Moderate aortic and iliac calcifications. No aneurysm. No adenopathy. Reproductive: Uterus and adnexa unremarkable.  No mass. Other: No free fluid or free air. Musculoskeletal: No acute bony abnormality or focal bone lesion. IMPRESSION: Left lower quadrant ostomy noted. Herniation of multiple small bowel loops through the ostomy defect without bowel obstruction. Electronically Signed   By: KRolm BaptiseM.D.   On: 06/21/2016 14:03   Dg Chest Port 1 View  Result Date: 06/21/2016 CLINICAL DATA:  Fever, weakness EXAM: PORTABLE CHEST 1 VIEW COMPARISON:  04/21/2016 FINDINGS: Heart and mediastinal contours are within normal limits. No focal opacities or effusions. No acute bony abnormality. IMPRESSION: No active disease. Electronically Signed   By:  KRolm BaptiseM.D.   On: 06/21/2016 11:35    Review of Systems  Constitutional: Positive for chills, fever and malaise/fatigue. Negative for diaphoresis and weight loss.  HENT: Negative for congestion and sore throat.   Respiratory: Negative for cough, sputum production and shortness of breath.   Cardiovascular: Negative for chest pain, claudication and leg swelling.  Gastrointestinal: Positive for abdominal pain and constipation. Negative for diarrhea, heartburn, nausea and vomiting.  Genitourinary: Negative for dysuria, frequency and hematuria.  Musculoskeletal: Negative for back pain, joint pain and neck pain.  Skin: Positive for rash. Negative for itching.  Neurological: Positive for weakness. Negative for dizziness and loss of consciousness.  Psychiatric/Behavioral: Negative for depression. The patient is not nervous/anxious.   All other systems reviewed and are negative.   Blood pressure (!) 122/92, pulse (!) 103, temperature 99.1 F (37.3 C), temperature source Oral, resp. rate 12, height 5' 9"  (1.753 m), weight 165 lb (74.8 kg), SpO2 98 %. Physical Exam  Vitals reviewed. Constitutional: She is oriented to person, place,  and time. She appears well-developed and well-nourished. No distress.  Disheveled  HENT:  Head: Normocephalic and atraumatic.  Right Ear: External ear normal.  Left Ear: External ear normal.  Nose: Nose normal.  Mouth/Throat: Oropharynx is clear and moist. No oropharyngeal exudate.  Eyes: Conjunctivae and EOM are normal. Pupils are equal, round, and reactive to light. No scleral icterus.  Neck: Normal range of motion. Neck supple. No tracheal deviation present.  Cardiovascular: Normal rate, regular rhythm, normal heart sounds and intact distal pulses.  Exam reveals no gallop and no friction rub.   No murmur heard. Respiratory: Effort normal and breath sounds normal. No respiratory distress. She has no wheezes. She has no rales.  GI: Soft. Bowel sounds are  normal. She exhibits no distension. There is tenderness. There is no rebound and no guarding.  Abdomen soft with midline incision still with granulation tissue in the lower portion of the wound, with erythema and cellulitis on the left side of the abdomen from the ostomy site to the midline incision. The ostomy itself is pink and patent with some slight protrusion and the hernia can be palpated just below and is able to be reduced with gentle pressure if the patient cooperates  Musculoskeletal: Normal range of motion. She exhibits no edema, tenderness or deformity.  Neurological: She is alert and oriented to person, place, and time. No cranial nerve deficit.  Skin: Skin is warm and dry. No rash noted. There is erythema. No pallor.  Psychiatric: She has a normal mood and affect. Her behavior is normal. Judgment and thought content normal.     Assessment/Plan 50 year old female with multiple issues here septic with a urinary tract infection as well as some abdominal wall cellulitis. The patient is being admitted to medicine to help with her chronic medical issues and to work her up and treat her for her urinary tract infection in the cellulitis. I have personally reviewed her past medical history which is quite significant for psychiatric disease. I also personally reviewed her laboratory values with elevated white blood cell count of 15 and a UA with positive leukocyte esterase and too many to count white blood cells that is positive for strep group B on the urine culture from yesterday. I've also personally reviewed her CT scan images which do show a parastomal hernia of small bowel however no inflammation and infection or fluid collections inside the abdomen and no thickening of the skin abscess collections are otherwise abnormalities on the abdominal wall. I have also personally reviewed the radiology read as above.   I had the discussion with the patient about the UTI and cellulitis on her abdomen and  the need to be treated with IV antibiotics for several days the patient is in agreement with this. I did discuss with her that I do not believe she needs any surgical intervention at this time as there is no abscess to drain and no inflammation in the bowels. The parastomal hernia is very easily reducible as well as the patient is not writhing and fighting you at the time. She and I also had a discussion about respecting her caregivers and nurses and other staff and that she will need to remain calm and respectful in order to get the appropriate care. She expressed understanding of this. I also got consent from her to speak with her mother Marlowe Kays who is very nice and calm was in agreement with the plan as stated above.  Hubbard Robinson, MD 06/21/2016, 4:29 PM

## 2016-06-21 NOTE — ED Notes (Signed)
This RN to bedside at this time. Pt sitting up in bed eating ice chips. Denies any needs at this time. Pt is noted to be labile with this RN at this time as well as rolling around in bed moaning somewhat incoherently. Will continue to monitor for further patient needs.

## 2016-06-21 NOTE — Progress Notes (Signed)
LCSW provided brief 1-1 support for patient  LCSW met with patient and after nurse reconnected patient this worker took the opportunity to have a very frank discussion about treatment of others and when she is in pain LCSW gave her 5 other choices to verbalize her concerns calmly and politely so her immediate needs could be met.  Patient is to be admitted and start treatment.  LCSW asked questions and collected data to complete assessment and met her mother briefly [facility]) verbal consent provided to speak to her Mom  Patient is agreeable to have IV medications and understand her need to remain in the hospital. She and I discussed her other MHI such as Bipolar, PTSD, Personality disorder. Patient proudly reports she does not miss her psychiatric medications and has followed with Dr Evelena Peat for 15 years. He reports she does not drive but lives independently on her own.  LCSW thanked patient for her time and reminded her of her many strengths and focused on her health and wellbeing and that kindness will go along way with staff and family.  [employer] LCSW [phone]

## 2016-06-21 NOTE — ED Notes (Signed)
Dr. Orvis BrillLoflin at bedside at this time. Claudine, CSW at bedside at this time. Pt placed back on monitor after unhooking herself. Denies any needs at this time. Will continue to monitor for further patient needs at this time.

## 2016-06-21 NOTE — ED Notes (Signed)
This RN and Darl PikesSusan, RN repositioned in the bed. Pt's mom is at bedside at this time. This RN placed patient on bedpan. CT notified that patient is ready to go to CT. MD aware pt did not drink all of her contrast, states to scan her anyway. Pt is noted to be flailing around in the bed mumbling, occasionally getting out "I'm hurting", otherwise pt is making nonsensical statements and rolling around in bed.

## 2016-06-22 LAB — URINE CULTURE

## 2016-06-22 LAB — COMPREHENSIVE METABOLIC PANEL
ALBUMIN: 2.8 g/dL — AB (ref 3.5–5.0)
ALT: 17 U/L (ref 14–54)
AST: 22 U/L (ref 15–41)
Alkaline Phosphatase: 78 U/L (ref 38–126)
Anion gap: 5 (ref 5–15)
BUN: 6 mg/dL (ref 6–20)
CHLORIDE: 107 mmol/L (ref 101–111)
CO2: 23 mmol/L (ref 22–32)
CREATININE: 0.69 mg/dL (ref 0.44–1.00)
Calcium: 7.6 mg/dL — ABNORMAL LOW (ref 8.9–10.3)
GFR calc non Af Amer: >60 mL/min (ref >60)
Glucose, Bld: 92 mg/dL (ref 65–99)
Potassium: 3 mmol/L — ABNORMAL LOW (ref 3.5–5.1)
SODIUM: 135 mmol/L (ref 135–145)
Total Bilirubin: 0.7 mg/dL (ref 0.3–1.2)
Total Protein: 6.3 g/dL — ABNORMAL LOW (ref 6.5–8.1)

## 2016-06-22 LAB — CBC
HCT: 28.1 % — ABNORMAL LOW (ref 35.0–47.0)
Hemoglobin: 9.6 g/dL — ABNORMAL LOW (ref 12.0–16.0)
MCH: 28.7 pg (ref 26.0–34.0)
MCHC: 34.1 g/dL (ref 32.0–36.0)
MCV: 84.2 fL (ref 80.0–100.0)
PLATELETS: 283 10*3/uL (ref 150–440)
RBC: 3.33 MIL/uL — AB (ref 3.80–5.20)
RDW: 16.1 % — ABNORMAL HIGH (ref 11.5–14.5)
WBC: 9.8 10*3/uL (ref 3.6–11.0)

## 2016-06-22 NOTE — Progress Notes (Signed)
[facility] - [place] at [facility]                                                                                                                                                                                  Patient Demographics   Sabrina Espinoza, is a 50 y.o. female, DOB - [birth year], ZOX:[id number]  Admit date - 06/21/2016   Admitting Physician Marguarite ArbourJeffrey D Sparks, MD  Outpatient Primary MD for the patient is Domenic SchwabLindley,Cheryl Paulette, FNP   LOS - 1  Subjective: Pt admited with abdominal cellutlis and uti, Was in the hospital 2 days a and left AMA went for follow-up with surgery and was noted to have abdominal cellulitis therefore referred for admission. Patient wants a regular diet. She does complain of abdominal pain.    Review of Systems:   CONSTITUTIONAL: No documented fever. No fatigue, weakness. No weight gain, no weight loss.  EYES: No blurry or double vision.  ENT: No tinnitus. No postnasal drip. No redness of the oropharynx.  RESPIRATORY: No cough, no wheeze, no hemoptysis. No dyspnea.  CARDIOVASCULAR: No chest pain. No orthopnea. No palpitations. No syncope.  GASTROINTESTINAL: No nausea, no vomiting or diarrhea.  + abdominal pain. No melena or hematochezia.  GENITOURINARY: No dysuria or hematuria.  ENDOCRINE: No polyuria or nocturia. No heat or cold intolerance.  HEMATOLOGY: No anemia. No bruising. No bleeding.  INTEGUMENTARY: No rashes. No lesions.  MUSCULOSKELETAL: No arthritis. No swelling. No gout.  NEUROLOGIC: No numbness, tingling, or ataxia. No seizure-type activity.  PSYCHIATRIC: No anxiety. No insomnia. No ADD.    Vitals:   Vitals:   06/22/16 0438 06/22/16 0748 06/22/16 1117 06/22/16 1435  BP: 104/73 93/63 113/61 99/61  Pulse: 91 88 94 87  Resp: 18 18 18 18   Temp: 98.6 F (37 C) 98.3 F (36.8 C) 98.3 F (36.8 C) 97.9 F (36.6 C)  TempSrc: Oral Oral  Oral  SpO2: 99% 99% 99% 97%  Weight:      Height:        Wt Readings from Last 3  Encounters:  06/21/16 165 lb (74.8 kg)  06/20/16 165 lb 6.4 oz (75 kg)  05/01/16 182 lb (82.6 kg)     Intake/Output Summary (Last 24 hours) at 06/22/16 1450 Last data filed at 06/22/16 0800  Gross per 24 hour  Intake          1408.33 ml  Output             1300 ml  Net           108.33 ml    Physical Exam:   GENERAL: Pleasant-appearing in no apparent distress.  HEAD, EYES, EARS, NOSE  AND THROAT: Atraumatic, normocephalic. Extraocular muscles are intact. Pupils equal and reactive to light. Sclerae anicteric. No conjunctival injection. No oro-pharyngeal erythema.  NECK: Supple. There is no jugular venous distention. No bruits, no lymphadenopathy, no thyromegaly.  HEART: Regular rate and rhythm,. No murmurs, no rubs, no clicks.  LUNGS: Clear to auscultation bilaterally. No rales or rhonchi. No wheezes.  ABDOMEN:  Post surgical changes, abdominal hernia, + bs x 4 EXTREMITIES: No evidence of any cyanosis, clubbing, or peripheral edema.  +2 pedal and radial pulses bilaterally.  NEUROLOGIC: The patient is alert, awake, and oriented x3 with no focal motor or sensory deficits appreciated bilaterally.  SKIN: Moist and warm with no rashes appreciated.  Psych: Not anxious, depressed LN: No inguinal LN enlargement    Antibiotics   Anti-infectives    Start     Dose/Rate Route Frequency Ordered Stop   06/21/16 2200  piperacillin-tazobactam (ZOSYN) IVPB 3.375 g     3.375 g 12.5 mL/hr over 240 Minutes Intravenous Every 8 hours 06/21/16 1502     06/21/16 2000  vancomycin (VANCOCIN) IVPB 1000 mg/200 mL premix     1,000 mg 200 mL/hr over 60 Minutes Intravenous Every 12 hours 06/21/16 1502     06/21/16 1145  vancomycin (VANCOCIN) IVPB 1000 mg/200 mL premix     1,000 mg 200 mL/hr over 60 Minutes Intravenous  Once 06/21/16 1133 06/21/16 1340   06/21/16 1145  piperacillin-tazobactam (ZOSYN) IVPB 3.375 g     3.375 g 100 mL/hr over 30 Minutes Intravenous  Once 06/21/16 1133 06/21/16 1236       Medications   Scheduled Meds: . azelastine  1 spray Each Nare BID  . clonazePAM  1 mg Oral TID  . diltiazem  180 mg Oral Daily  . docusate sodium  100 mg Oral BID  . famotidine (PEPCID) IV  20 mg Intravenous Q12H  . fluticasone  1 spray Each Nare BID  . gabapentin  400 mg Oral TID  . heparin  5,000 Units Subcutaneous Q8H  . hydrOXYzine  50 mg Oral BID  . lamoTRIgine  300 mg Oral Daily  . mometasone-formoterol  2 puff Inhalation BID  . montelukast  10 mg Oral QHS  . nicotine  21 mg Transdermal Daily  . nortriptyline  100 mg Oral QHS  . pantoprazole  40 mg Oral BH-q7a  . piperacillin-tazobactam (ZOSYN)  IV  3.375 g Intravenous Q8H  . prazosin  5 mg Oral QHS  . sodium chloride  250 mL Intravenous Once  . vancomycin  1,000 mg Intravenous Q12H   Continuous Infusions: . 0.9 % NaCl with KCl 20 mEq / L 100 mL/hr at 06/22/16 0645   PRN Meds:.acetaminophen **OR** acetaminophen, albuterol, bisacodyl, butalbital-acetaminophen-caffeine, cyclobenzaprine, dimenhyDRINATE, HYDROmorphone (DILAUDID) injection, morphine injection, ondansetron **OR** ondansetron (ZOFRAN) IV, ondansetron, oxyCODONE, zolpidem   Data Review:   Micro Results Recent Results (from the past 240 hour(s))  Urine culture     Status: Abnormal   Collection Time: 06/20/16  3:50 PM  Result Value Ref Range Status   Specimen Description URINE, RANDOM  Final   Special Requests NONE  Final   Culture 60,000 COLONIES/mL STREPTOCOCCUS GROUP G (A)  Final   Report Status 06/21/2016 FINAL  Final  Culture, blood (routine x 2)     Status: None (Preliminary result)   Collection Time: 06/20/16  4:12 PM  Result Value Ref Range Status   Specimen Description BLOOD RIGHT ANTECUBITAL  Final   Special Requests BOTTLES DRAWN AEROBIC AND ANAEROBIC  11CC  Final   Culture NO GROWTH 2 DAYS  Final   Report Status PENDING  Incomplete  Culture, blood (routine x 2)     Status: None (Preliminary result)   Collection Time: 06/20/16  4:12 PM   Result Value Ref Range Status   Specimen Description BLOOD LEFT ANTECUBITAL  Final   Special Requests BOTTLES DRAWN AEROBIC AND ANAEROBIC  10CC  Final   Culture NO GROWTH 2 DAYS  Final   Report Status PENDING  Incomplete  Blood Culture (routine x 2)     Status: None (Preliminary result)   Collection Time: 06/21/16 11:27 AM  Result Value Ref Range Status   Specimen Description BLOOD left ac  Final   Special Requests BOTTLES DRAWN AEROBIC AND ANAEROBIC 9CC  Final   Culture NO GROWTH < 24 HOURS  Final   Report Status PENDING  Incomplete  Blood Culture (routine x 2)     Status: None (Preliminary result)   Collection Time: 06/21/16 11:27 AM  Result Value Ref Range Status   Specimen Description BLOOD RIGHT FOREARM  Final   Special Requests BOTTLES DRAWN AEROBIC AND ANAEROBIC 7CC  Final   Culture NO GROWTH < 24 HOURS  Final   Report Status PENDING  Incomplete    Radiology Reports Ct Abdomen Pelvis W Contrast  Result Date: 06/21/2016 CLINICAL DATA:  Fever, weakness, redness around the incision. Colostomy surgery in October EXAM: CT ABDOMEN AND PELVIS WITH CONTRAST TECHNIQUE: Multidetector CT imaging of the abdomen and pelvis was performed using the standard protocol following bolus administration of intravenous contrast. CONTRAST:  ISOVUE-300 IOPAMIDOL (ISOVUE-300) INJECTION 61% COMPARISON:  04/25/2016 FINDINGS: Lower chest: Lung bases are clear. No effusions. Heart is normal size. Hepatobiliary: No focal hepatic abnormality. Gallbladder unremarkable. Pancreas: No focal abnormality or ductal dilatation. Spleen: No focal abnormality.  Normal size. Adrenals/Urinary Tract: No adrenal abnormality. No focal renal abnormality. No stones or hydronephrosis. Urinary bladder is unremarkable. Stomach/Bowel: Surgical changes from partial colectomy with left lower quadrant ostomy. Herniated small bowel loops noted in the left lower quadrant through the ostomy defect. This is new since prior study. No  evidence of bowel obstruction. Stomach unremarkable. Vascular/Lymphatic: Moderate aortic and iliac calcifications. No aneurysm. No adenopathy. Reproductive: Uterus and adnexa unremarkable.  No mass. Other: No free fluid or free air. Musculoskeletal: No acute bony abnormality or focal bone lesion. IMPRESSION: Left lower quadrant ostomy noted. Herniation of multiple small bowel loops through the ostomy defect without bowel obstruction. Electronically Signed   By: Charlett Nose M.D.   On: 06/21/2016 14:03   Dg Chest Port 1 View  Result Date: 06/21/2016 CLINICAL DATA:  Fever, weakness EXAM: PORTABLE CHEST 1 VIEW COMPARISON:  04/21/2016 FINDINGS: Heart and mediastinal contours are within normal limits. No focal opacities or effusions. No acute bony abnormality. IMPRESSION: No active disease. Electronically Signed   By: Charlett Nose M.D.   On: 06/21/2016 11:35     CBC  Recent Labs Lab 06/20/16 1612 06/21/16 1126 06/22/16 0436  WBC 15.6* 15.7* 9.8  HGB 11.0* 11.5* 9.6*  HCT 32.7* 34.0* 28.1*  PLT 417 382 283  MCV 83.7 84.5 84.2  MCH 28.1 28.6 28.7  MCHC 33.6 33.9 34.1  RDW 15.7* 15.9* 16.1*  LYMPHSABS  --  2.8  --   MONOABS  --  1.0*  --   EOSABS  --  0.1  --   BASOSABS  --  0.1  --     Chemistries   Recent Labs Lab 06/20/16 1612 06/21/16  1126 06/22/16 0436  NA 133* 135 135  K 3.2* 3.0* 3.0*  CL 102 102 107  CO2 23 22 23   GLUCOSE 85 80 92  BUN 8 7 6   CREATININE 0.92 0.75 0.69  CALCIUM 8.7* 8.8* 7.6*  MG 1.4*  --   --   AST 32 40 22  ALT 17 25 17   ALKPHOS 90 107 78  BILITOT 0.4 0.4 0.7   ------------------------------------------------------------------------------------------------------------------ estimated creatinine clearance is 90.9 mL/min (by C-G formula based on SCr of 0.69 mg/dL). ------------------------------------------------------------------------------------------------------------------ No results for input(s): HGBA1C in the last 72  hours. ------------------------------------------------------------------------------------------------------------------ No results for input(s): CHOL, HDL, LDLCALC, TRIG, CHOLHDL, LDLDIRECT in the last 72 hours. ------------------------------------------------------------------------------------------------------------------  Recent Labs  06/20/16 1612  TSH 0.241*   ------------------------------------------------------------------------------------------------------------------ No results for input(s): VITAMINB12, FOLATE, FERRITIN, TIBC, IRON, RETICCTPCT in the last 72 hours.  Coagulation profile  Recent Labs Lab 06/20/16 1612  INR 0.98    No results for input(s): DDIMER in the last 72 hours.  Cardiac Enzymes No results for input(s): CKMB, TROPONINI, MYOGLOBIN in the last 168 hours.  Invalid input(s): CK ------------------------------------------------------------------------------------------------------------------ Invalid input(s): POCBNP    Assessment & Plan  Patient  Is 50 y.o who was recently in the hospital now admitted for abdominal cellulitis  1. Abdominal cellulits- continue Iv zosyn  2. uti with only 60,000 group g sterp should be able to cover with zosyn 3. Abdominal hernia- reduction per sugery 4 COPD  Without exaceberation continue inhalers 5. Misc: heparin      Code Status Orders        Start     Ordered   06/21/16 1630  Full code  Continuous     06/21/16 1630    Code Status History    Date Active Date Inactive Code Status Order ID Comments User Context   06/20/2016  3:50 PM 06/20/2016 11:53 PM Full Code [id number]  Leafy Ro, MD Inpatient   04/17/2016  1:48 PM 04/29/2016  3:53 PM Full Code [id number]  Ricarda Frame, MD Inpatient           Consults   DVT Prophylaxis  Lovenox   Lab Results  Component Value Date   PLT 283 06/22/2016     Time Spent in minutes   Greater than 50% of time spent in care coordination  and counseling patient regarding the condition and plan of care.   Auburn Bilberry M.D on 06/22/2016 at 2:50 PM  Between 7am to 6pm - Pager - [phone]  After 6pm go to [email or web address] - password [email or web address] [facility]  [facility] [place] [facility]   Office  [phone]

## 2016-06-22 NOTE — Evaluation (Signed)
Physical Therapy Evaluation Patient Details Name: Sabrina Espinoza MRN: [id number] DOB: 03-15-1966 Today's Date: 06/22/2016   History of Present Illness  50 yo female with onset of sepsis from a recent peritoneal surgery with irrigation, now with protruding abdomen and infected tissues.  Ostomy in place, has left AMA from hospital already this episode and talking about leaving again.  PMHx:  bipolar, PTSD, COPD, migraine, substance abuse  Clinical Impression  Pt is getting up to walk with PT but poorly following safety instructions.  Recommended to continue acutely and have a second person to ensure safety.  If SNF is not available to her will need 24/7 help from her family and friends.    Follow Up Recommendations SNF    Equipment Recommendations  Rolling walker with 5" wheels    Recommendations for Other Services       Precautions / Restrictions Precautions Precautions: Fall (telemetry) Restrictions Weight Bearing Restrictions: No      Mobility  Bed Mobility Overal bed mobility: Needs Assistance Bed Mobility: Supine to Sit;Sit to Supine     Supine to sit: Min assist Sit to supine: Min guard   General bed mobility comments: pt ignored instructions and is moving with pulling against her abdominal incisions  Transfers Overall transfer level: Needs assistance Equipment used: Rolling walker (2 wheeled);1 person hand held assist Transfers: Stand Pivot Transfers;Sit to/from Stand Sit to Stand: Min guard;Min assist Stand pivot transfers: Min guard;Min assist (mainly due to pt resistance)          Ambulation/Gait Ambulation/Gait assistance: Min assist Ambulation Distance (Feet): 20 Feet Assistive device: Rolling walker (2 wheeled);1 person hand held assist Gait Pattern/deviations: Step-through pattern;Wide base of support;Ataxic;Decreased stride length Gait velocity: reduced Gait velocity interpretation: Below normal speed for age/gender General Gait Details: pt is  moving in an unsafe way with attempts to pick up her walker and move it to locations around furniture rather than wait for PT as asked  Stairs            Wheelchair Mobility    Modified Rankin (Stroke Patients Only)       Balance Overall balance assessment: Needs assistance Sitting-balance support: Feet supported Sitting balance-Leahy Scale: Good     Standing balance support: Bilateral upper extremity supported Standing balance-Leahy Scale: Fair Standing balance comment: less than fair dynamic balance                             Pertinent Vitals/Pain Pain Assessment: Faces Faces Pain Scale: Hurts little more Pain Location: abdomen with mobility Pain Intervention(s): Monitored during session;Premedicated before session;Repositioned    Home Living Family/patient expects to be discharged to:: Private residence Living Arrangements: Alone Available Help at Discharge: Family;Friend(s) Type of Home: Apartment Home Access: Level entry     Home Layout: One level Home Equipment: None      Prior Function Level of Independence: Independent               Hand Dominance        Extremity/Trunk Assessment   Upper Extremity Assessment Upper Extremity Assessment: Overall WFL for tasks assessed    Lower Extremity Assessment Lower Extremity Assessment: Overall WFL for tasks assessed    Cervical / Trunk Assessment Cervical / Trunk Assessment: Normal  Communication   Communication: No difficulties  Cognition Arousal/Alertness: Awake/alert Behavior During Therapy: Impulsive;Restless Overall Cognitive Status: No family/caregiver present to determine baseline cognitive functioning  General Comments: pt ignores instructions for safety at times    General Comments      Exercises     Assessment/Plan    PT Assessment Patient needs continued PT services  PT Problem List Decreased strength;Decreased range of motion;Decreased  activity tolerance;Decreased balance;Decreased mobility;Decreased coordination;Decreased cognition;Decreased knowledge of use of DME;Decreased safety awareness;Decreased knowledge of precautions;Cardiopulmonary status limiting activity;Decreased skin integrity;Pain          PT Treatment Interventions DME instruction;Gait training;Functional mobility training;Therapeutic activities;Therapeutic exercise;Balance training;Neuromuscular re-education;Patient/family education    PT Goals (Current goals can be found in the Care Plan section)  Acute Rehab PT Goals Patient Stated Goal: to walk with no help PT Goal Formulation: With patient Time For Goal Achievement: 07/06/16 Potential to Achieve Goals: Good    Frequency Min 2X/week   Barriers to discharge Decreased caregiver support home alone with unsafe mobility and self reported weakness    Co-evaluation               End of Session Equipment Utilized During Treatment: Gait belt (around upper body to avoid incisions) Activity Tolerance: Patient tolerated treatment well;Treatment limited secondary to agitation (pt did not like being instructed for safety) Patient left: in bed;with call bell/phone within reach;with bed alarm set Nurse Communication: Mobility status;Other (comment) (safety concerns and use of bed alarm)         Time: [id number] PT Time Calculation (min) (ACUTE ONLY): 24 min   Charges:   PT Evaluation $PT Eval Moderate Complexity: 1 Procedure PT Treatments $Gait Training: 8-22 mins   PT G Codes:        Ivar DrapeStout, Angell Pincock E 06/22/2016, 1:09 PM   Samul Dadauth Precious Segall, PT MS Acute Rehab Dept. Number: [facility] [phone] and [facility] [phone]

## 2016-06-22 NOTE — Progress Notes (Signed)
50 yr old female with UTI sepsis and abdominal wall cellulitis.  She states having some abdominal pain but improved today.  She has had output from ostomy and no blood today.   Vitals:   06/22/16 1435 06/22/16 1534  BP: 99/61 (!) 164/86  Pulse: 87 90  Resp: 18 18  Temp: 97.9 F (36.6 C) 98.3 F (36.8 C)   I/O last 3 completed shifts: In: 2668.3 [I.V.:2018.3; IV Piggyback:650] Out: 1300 [Urine:1300] No intake/output data recorded.   PE:  Gen: NAD Abd: soft, midline incision with some granulation still in inferior portion, ostomy pink and patent with some slight protrusion, erythema and cellulitis of left abdomen, slightly better today   CBC Latest Ref Rng & Units 06/22/2016 06/21/2016 06/20/2016  WBC 3.6 - 11.0 K/uL 9.8 15.7(H) 15.6(H)  Hemoglobin 12.0 - 16.0 g/dL 4.0(J9.6(L) 11.5(L) 11.0(L)  Hematocrit 35.0 - 47.0 % 28.1(L) 34.0(L) 32.7(L)  Platelets 150 - 440 K/uL 283 382 417   CMP Latest Ref Rng & Units 06/22/2016 06/21/2016 06/20/2016  Glucose 65 - 99 mg/dL 92 80 85  BUN 6 - 20 mg/dL 6 7 8   Creatinine 0.44 - 1.00 mg/dL 8.110.69 9.140.75 7.820.92  Sodium 135 - 145 mmol/L 135 135 133(L)  Potassium 3.5 - 5.1 mmol/L 3.0(L) 3.0(L) 3.2(L)  Chloride 101 - 111 mmol/L 107 102 102  CO2 22 - 32 mmol/L 23 22 23   Calcium 8.9 - 10.3 mg/dL 7.6(L) 8.8(L) 8.7(L)  Total Protein 6.5 - 8.1 g/dL 6.3(L) 7.9 7.2  Total Bilirubin 0.3 - 1.2 mg/dL 0.7 0.4 0.4  Alkaline Phos 38 - 126 U/L 78 107 90  AST 15 - 41 U/L 22 40 32  ALT 14 - 54 U/L 17 25 17    A/P:  50 yr old female with UTI and abdominal wall cellulitis.  Overall seems to be improving today WBC now normal.  Surgery will continue to follow.

## 2016-06-23 DIAGNOSIS — F3177 Bipolar disorder, in partial remission, most recent episode mixed: Secondary | ICD-10-CM

## 2016-06-23 LAB — BASIC METABOLIC PANEL
ANION GAP: 5 (ref 5–15)
BUN: <5 mg/dL — ABNORMAL LOW (ref 6–20)
CO2: 22 mmol/L (ref 22–32)
Calcium: 8 mg/dL — ABNORMAL LOW (ref 8.9–10.3)
Chloride: 110 mmol/L (ref 101–111)
Creatinine, Ser: 0.62 mg/dL (ref 0.44–1.00)
GLUCOSE: 87 mg/dL (ref 65–99)
POTASSIUM: 3.4 mmol/L — AB (ref 3.5–5.1)
Sodium: 137 mmol/L (ref 135–145)

## 2016-06-23 LAB — CBC WITH DIFFERENTIAL/PLATELET
BASOS ABS: 0.1 10*3/uL (ref 0–0.1)
Basophils Relative: 1 %
EOS ABS: 0.4 10*3/uL (ref 0–0.7)
Eosinophils Relative: 7 %
HEMATOCRIT: 26.2 % — AB (ref 35.0–47.0)
Hemoglobin: 8.8 g/dL — ABNORMAL LOW (ref 12.0–16.0)
LYMPHS PCT: 36 %
Lymphs Abs: 2.1 10*3/uL (ref 1.0–3.6)
MCH: 28.4 pg (ref 26.0–34.0)
MCHC: 33.7 g/dL (ref 32.0–36.0)
MCV: 84.3 fL (ref 80.0–100.0)
Monocytes Absolute: 0.6 10*3/uL (ref 0.2–0.9)
Monocytes Relative: 10 %
NEUTROS PCT: 46 %
Neutro Abs: 2.7 10*3/uL (ref 1.4–6.5)
Platelets: 280 10*3/uL (ref 150–440)
RBC: 3.11 MIL/uL — AB (ref 3.80–5.20)
RDW: 16.2 % — ABNORMAL HIGH (ref 11.5–14.5)
WBC: 5.9 10*3/uL (ref 3.6–11.0)

## 2016-06-23 LAB — VANCOMYCIN, TROUGH: VANCOMYCIN TR: 14 ug/mL — AB (ref 15–20)

## 2016-06-23 MED ORDER — ENSURE ENLIVE PO LIQD
237.0000 mL | Freq: Two times a day (BID) | ORAL | Status: DC
  Administered 2016-06-23 – 2016-06-24 (×2): 237 mL via ORAL

## 2016-06-23 MED ORDER — POLYETHYLENE GLYCOL 3350 17 G PO PACK
17.0000 g | PACK | Freq: Every day | ORAL | Status: DC
  Administered 2016-06-23 – 2016-06-24 (×2): 17 g via ORAL
  Filled 2016-06-23 (×2): qty 1

## 2016-06-23 MED ORDER — ENOXAPARIN SODIUM 40 MG/0.4ML ~~LOC~~ SOLN
40.0000 mg | SUBCUTANEOUS | Status: DC
  Administered 2016-06-23: 40 mg via SUBCUTANEOUS
  Filled 2016-06-23: qty 0.4

## 2016-06-23 MED ORDER — POTASSIUM CHLORIDE CRYS ER 20 MEQ PO TBCR
40.0000 meq | EXTENDED_RELEASE_TABLET | Freq: Once | ORAL | Status: AC
Start: 2016-06-23 — End: 2016-06-23
  Administered 2016-06-23: 40 meq via ORAL
  Filled 2016-06-23: qty 2

## 2016-06-23 NOTE — Progress Notes (Signed)
[facility] [employer] [place] [facility]                                                                                                                                                                                  Patient Demographics   Sabrina Espinoza, is a 50 y.o. female, DOB - January 13, 1966, ZOX:[id number]  Admit date - 06/21/2016   Admitting Physician Marguarite Arbour, MD  Outpatient Primary MD for the patient is Domenic Schwab, FNP   LOS - 2  Subjective: Patient feeling better her abdominal pain improved redness improved she was recommended to go to rehabilitation however she is refusing     Review of Systems:   CONSTITUTIONAL: No documented fever. No fatigue, weakness. No weight gain, no weight loss.  EYES: No blurry or double vision.  ENT: No tinnitus. No postnasal drip. No redness of the oropharynx.  RESPIRATORY: No cough, no wheeze, no hemoptysis. No dyspnea.  CARDIOVASCULAR: No chest pain. No orthopnea. No palpitations. No syncope.  GASTROINTESTINAL: No nausea, no vomiting or diarrhea.  + abdominal pain. No melena or hematochezia.  GENITOURINARY: No dysuria or hematuria.  ENDOCRINE: No polyuria or nocturia. No heat or cold intolerance.  HEMATOLOGY: No anemia. No bruising. No bleeding.  INTEGUMENTARY: No rashes. No lesions.  MUSCULOSKELETAL: No arthritis. No swelling. No gout.  NEUROLOGIC: No numbness, tingling, or ataxia. No seizure-type activity.  PSYCHIATRIC: No anxiety. No insomnia. No ADD.    Vitals:   Vitals:   06/22/16 2021 06/23/16 0008 06/23/16 0418 06/23/16 0637  BP: 109/73 105/66 102/69   Pulse: 88 97 80   Resp: 18 18 16    Temp: 98.6 F (37 C) 98.2 F (36.8 C) 98 F (36.7 C)   TempSrc: Oral Oral Oral   SpO2: 97% 99% 96%   Weight:    167 lb 3.2 oz (75.8 kg)  Height:        Wt Readings from Last 3 Encounters:  06/23/16 167 lb 3.2 oz (75.8 kg)  06/20/16 165 lb 6.4 oz (75 kg)  05/01/16 182 lb (82.6 kg)     Intake/Output  Summary (Last 24 hours) at 06/23/16 1454 Last data filed at 06/23/16 1358  Gross per 24 hour  Intake          4353.33 ml  Output              500 ml  Net          3853.33 ml    Physical Exam:   GENERAL: Pleasant-appearing in no apparent distress.  HEAD, EYES, EARS, NOSE AND THROAT: Atraumatic, normocephalic. Extraocular muscles are intact. Pupils equal and reactive to light. Sclerae anicteric. No conjunctival injection.  No oro-pharyngeal erythema.  NECK: Supple. There is no jugular venous distention. No bruits, no lymphadenopathy, no thyromegaly.  HEART: Regular rate and rhythm,. No murmurs, no rubs, no clicks.  LUNGS: Clear to auscultation bilaterally. No rales or rhonchi. No wheezes.  ABDOMEN:  Post surgical changes, abdominal hernia, + bs x 4 EXTREMITIES: No evidence of any cyanosis, clubbing, or peripheral edema.  +2 pedal and radial pulses bilaterally.  NEUROLOGIC: The patient is alert, awake, and oriented x3 with no focal motor or sensory deficits appreciated bilaterally.  SKIN: Positive erythema involving the abdomen wall Psych: Not anxious, depressed LN: No inguinal LN enlargement    Antibiotics   Anti-infectives    Start     Dose/Rate Route Frequency Ordered Stop   06/21/16 2200  piperacillin-tazobactam (ZOSYN) IVPB 3.375 g     3.375 g 12.5 mL/hr over 240 Minutes Intravenous Every 8 hours 06/21/16 1502     06/21/16 2000  vancomycin (VANCOCIN) IVPB 1000 mg/200 mL premix     1,000 mg 200 mL/hr over 60 Minutes Intravenous Every 12 hours 06/21/16 1502     06/21/16 1145  vancomycin (VANCOCIN) IVPB 1000 mg/200 mL premix     1,000 mg 200 mL/hr over 60 Minutes Intravenous  Once 06/21/16 1133 06/21/16 1340   06/21/16 1145  piperacillin-tazobactam (ZOSYN) IVPB 3.375 g     3.375 g 100 mL/hr over 30 Minutes Intravenous  Once 06/21/16 1133 06/21/16 1236      Medications   Scheduled Meds: . azelastine  1 spray Each Nare BID  . clonazePAM  1 mg Oral TID  . diltiazem  180 mg  Oral Daily  . docusate sodium  100 mg Oral BID  . famotidine (PEPCID) IV  20 mg Intravenous Q12H  . fluticasone  1 spray Each Nare BID  . gabapentin  400 mg Oral TID  . heparin  5,000 Units Subcutaneous Q8H  . hydrOXYzine  50 mg Oral BID  . lamoTRIgine  300 mg Oral Daily  . mometasone-formoterol  2 puff Inhalation BID  . montelukast  10 mg Oral QHS  . nicotine  21 mg Transdermal Daily  . nortriptyline  100 mg Oral QHS  . pantoprazole  40 mg Oral BH-q7a  . piperacillin-tazobactam (ZOSYN)  IV  3.375 g Intravenous Q8H  . prazosin  5 mg Oral QHS  . sodium chloride  250 mL Intravenous Once  . vancomycin  1,000 mg Intravenous Q12H   Continuous Infusions:  PRN Meds:.acetaminophen **OR** acetaminophen, albuterol, bisacodyl, butalbital-acetaminophen-caffeine, cyclobenzaprine, dimenhyDRINATE, HYDROmorphone (DILAUDID) injection, morphine injection, ondansetron **OR** ondansetron (ZOFRAN) IV, ondansetron, oxyCODONE, zolpidem   Data Review:   Micro Results Recent Results (from the past 240 hour(s))  Urine culture     Status: Abnormal   Collection Time: 06/20/16  3:50 PM  Result Value Ref Range Status   Specimen Description URINE, RANDOM  Final   Special Requests NONE  Final   Culture 60,000 COLONIES/mL STREPTOCOCCUS GROUP G (A)  Final   Report Status 06/21/2016 FINAL  Final  Culture, blood (routine x 2)     Status: None (Preliminary result)   Collection Time: 06/20/16  4:12 PM  Result Value Ref Range Status   Specimen Description BLOOD RIGHT ANTECUBITAL  Final   Special Requests BOTTLES DRAWN AEROBIC AND ANAEROBIC  11CC  Final   Culture NO GROWTH 3 DAYS  Final   Report Status PENDING  Incomplete  Culture, blood (routine x 2)     Status: None (Preliminary result)   Collection Time: 06/20/16  4:12 PM  Result Value Ref Range Status   Specimen Description BLOOD LEFT ANTECUBITAL  Final   Special Requests BOTTLES DRAWN AEROBIC AND ANAEROBIC  10CC  Final   Culture NO GROWTH 3 DAYS  Final    Report Status PENDING  Incomplete  Urine culture     Status: Abnormal   Collection Time: 06/21/16 11:14 AM  Result Value Ref Range Status   Specimen Description URINE, RANDOM  Final   Special Requests NONE  Final   Culture (A)  Final    <10,000 COLONIES/mL INSIGNIFICANT GROWTH Performed at [facility]    Report Status 06/22/2016 FINAL  Final  Blood Culture (routine x 2)     Status: None (Preliminary result)   Collection Time: 06/21/16 11:27 AM  Result Value Ref Range Status   Specimen Description BLOOD left ac  Final   Special Requests BOTTLES DRAWN AEROBIC AND ANAEROBIC 9CC  Final   Culture NO GROWTH 2 DAYS  Final   Report Status PENDING  Incomplete  Blood Culture (routine x 2)     Status: None (Preliminary result)   Collection Time: 06/21/16 11:27 AM  Result Value Ref Range Status   Specimen Description BLOOD RIGHT FOREARM  Final   Special Requests BOTTLES DRAWN AEROBIC AND ANAEROBIC 7CC  Final   Culture NO GROWTH 2 DAYS  Final   Report Status PENDING  Incomplete    Radiology Reports Ct Abdomen Pelvis W Contrast  Result Date: 06/21/2016 CLINICAL DATA:  Fever, weakness, redness around the incision. Colostomy surgery in October EXAM: CT ABDOMEN AND PELVIS WITH CONTRAST TECHNIQUE: Multidetector CT imaging of the abdomen and pelvis was performed using the standard protocol following bolus administration of intravenous contrast. CONTRAST:  ISOVUE-300 IOPAMIDOL (ISOVUE-300) INJECTION 61% COMPARISON:  04/25/2016 FINDINGS: Lower chest: Lung bases are clear. No effusions. Heart is normal size. Hepatobiliary: No focal hepatic abnormality. Gallbladder unremarkable. Pancreas: No focal abnormality or ductal dilatation. Spleen: No focal abnormality.  Normal size. Adrenals/Urinary Tract: No adrenal abnormality. No focal renal abnormality. No stones or hydronephrosis. Urinary bladder is unremarkable. 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 Herniated small bowel loops noted in the left lower quadrant through the ostomy defect. This is new since prior study. No evidence of bowel obstruction. Stomach unremarkable. Vascular/Lymphatic: Moderate aortic and iliac calcifications. No aneurysm. No adenopathy. Reproductive: Uterus and adnexa unremarkable.  No mass. Other: No free fluid or free air. Musculoskeletal: No acute bony abnormality or focal bone lesion. IMPRESSION: Left lower quadrant ostomy noted. Herniation of multiple small bowel loops through the ostomy defect without bowel obstruction. Electronically Signed   By: Charlett Nose M.D.   On: 06/21/2016 14:03   Dg Chest Port 1 View  Result Date: 06/21/2016 CLINICAL DATA:  Fever, weakness EXAM: PORTABLE CHEST 1 VIEW COMPARISON:  04/21/2016 FINDINGS: Heart and mediastinal contours are within normal limits. No focal opacities or effusions. No acute bony abnormality. IMPRESSION: No active disease. Electronically Signed   By: Charlett Nose M.D.   On: 06/21/2016 11:35     CBC  Recent Labs Lab 06/20/16 1612 06/21/16 1126 06/22/16 0436 06/23/16 0746  WBC 15.6* 15.7* 9.8 5.9  HGB 11.0* 11.5* 9.6* 8.8*  HCT 32.7* 34.0* 28.1* 26.2*  PLT 417 382 283 280  MCV 83.7 84.5 84.2 84.3  MCH 28.1 28.6 28.7 28.4  MCHC 33.6 33.9 34.1 33.7  RDW 15.7* 15.9* 16.1* 16.2*  LYMPHSABS  --  2.8  --  PENDING  MONOABS  --  1.0*  --  PENDING  EOSABS  --  0.1  --  PENDING  BASOSABS  --  0.1  --  PENDING    Chemistries   Recent Labs Lab 06/20/16 1612 06/21/16 1126 06/22/16 0436 06/23/16 0746  NA 133* 135 135 137  K 3.2* 3.0* 3.0* 3.4*  CL 102 102 107 110  CO2 23 22 23 22   GLUCOSE 85 80 92 87  BUN 8 7 6  <5*  CREATININE 0.92 0.75 0.69 0.62  CALCIUM 8.7* 8.8* 7.6* 8.0*  MG 1.4*  --   --   --   AST 32 40 22  --   ALT 17 25 17   --   ALKPHOS 90 107 78  --   BILITOT 0.4 0.4 0.7  --     ------------------------------------------------------------------------------------------------------------------ estimated creatinine clearance is 90.9 mL/min (by C-G formula based on SCr of 0.62 mg/dL). ------------------------------------------------------------------------------------------------------------------ No results for input(s): HGBA1C in the last 72 hours. ------------------------------------------------------------------------------------------------------------------ No results for input(s): CHOL, HDL, LDLCALC, TRIG, CHOLHDL, LDLDIRECT in the last 72 hours. ------------------------------------------------------------------------------------------------------------------  Recent Labs  06/20/16 1612  TSH 0.241*   ------------------------------------------------------------------------------------------------------------------ No results for input(s): VITAMINB12, FOLATE, FERRITIN, TIBC, IRON, RETICCTPCT in the last 72 hours.  Coagulation profile  Recent Labs Lab 06/20/16 1612  INR 0.98    No results for input(s): DDIMER in the last 72 hours.  Cardiac Enzymes No results for input(s): CKMB, TROPONINI, MYOGLOBIN in the last 168 hours.  Invalid input(s): CK ------------------------------------------------------------------------------------------------------------------ Invalid input(s): POCBNP    Assessment & Plan  Patient  Is 50 y.o who was recently in the hospital now admitted for abdominal cellulitis  1. Abdominal cellulits- continue Iv zosyn seems to be responding to antibiotics 2. uti with only 60,000 group g sterp should be able to cover with zosyn 3. Abdominal hernia- reduction per sugery 4 COPD  Without exaceberation continue inhalers 5. Hypokalemia replace K+ 6. Patient refusing to go to rehabilitation there is concern about her safety social worker requested psychiatry evaluation for competency      Code Status Orders        Start      Ordered   06/21/16 1630  Full code  Continuous     06/21/16 1630    Code Status History    Date Active Date Inactive Code Status Order ID Comments User Context   06/20/2016  3:50 PM 06/20/2016 11:53 PM Full Code [id number]  [name], MD Inpatient   04/17/2016  1:48 PM 04/29/2016  3:53 PM Full Code [id number]  Ricarda Frameharles Woodham, MD Inpatient           Consults  surgery DVT Prophylaxis  Lovenox   Lab Results  Component Value Date   PLT 280 06/23/2016     Time Spent in minutes  32min  Greater than 50% of time spent in care coordination and counseling patient regarding the condition and plan of care.   Auburn BilberryPATEL, Gwynn Chalker M.D on 06/23/2016 at 2:54 PM  Between 7am to 6pm - Pager - [phone]  After 6pm go to [email or web address] - password [email or web address] [facility]  [facility]   Office  [phone]

## 2016-06-23 NOTE — Progress Notes (Signed)
CC: Abdominal pain Subjective: Patient reports doing much better today. Had flight of ideas facility she thinks she is going home tomorrow. Has been tolerating a diet and is feeling well.  Objective: Vital signs in last 24 hours: Temp:  [98 F (36.7 C)-98.6 F (37 C)] 98 F (36.7 C) (12/18 0418) Pulse Rate:  [80-97] 80 (12/18 0418) Resp:  [16-18] 16 (12/18 0418) BP: (102-109)/(66-73) 102/69 (12/18 0418) SpO2:  [96 %-99 %] 96 % (12/18 0418) Weight:  [75.8 kg (167 lb 3.2 oz)] 75.8 kg (167 lb 3.2 oz) (12/18 [id number]) Last BM Date: 06/19/16  Intake/Output from previous day: 12/17 0701 - 12/18 0700 In: 1610 [I.V.:910; IV Piggyback:700] Out: 500 [Urine:500] Intake/Output this shift: Total I/O In: 2743.3 [P.O.:360; I.V.:2133.3; IV Piggyback:250] Out: -   Physical exam:  Gen.: No acute distress Chest: Clear to auscultation Heart: Regular rhythm Abdomen: Soft, minimally tender to deep palpation along her midline incision and enterostomy site. In the colostomy present in the left upper quadrant that is pink, patent, productive of gas and stool. Midline incision appears be healing by secondary intention. Mild cellulitis between the midline incision and the colostomy.  Lab Results: CBC   Recent Labs  06/22/16 0436 06/23/16 0746  WBC 9.8 5.9  HGB 9.6* 8.8*  HCT 28.1* 26.2*  PLT 283 280   BMET  Recent Labs  06/22/16 0436 06/23/16 0746  NA 135 137  K 3.0* 3.4*  CL 107 110  CO2 23 22  GLUCOSE 92 87  BUN 6 <5*  CREATININE 0.69 0.62  CALCIUM 7.6* 8.0*   PT/INR  Recent Labs  06/20/16 1612  LABPROT 13.0  INR 0.98   ABG No results for input(s): PHART, HCO3 in the last 72 hours.  Invalid input(s): PCO2, PO2  Studies/Results: No results found.  Anti-infectives: Anti-infectives    Start     Dose/Rate Route Frequency Ordered Stop   06/21/16 2200  piperacillin-tazobactam (ZOSYN) IVPB 3.375 g     3.375 g 12.5 mL/hr over 240 Minutes Intravenous Every 8 hours 06/21/16  1502     06/21/16 2000  vancomycin (VANCOCIN) IVPB 1000 mg/200 mL premix     1,000 mg 200 mL/hr over 60 Minutes Intravenous Every 12 hours 06/21/16 1502     06/21/16 1145  vancomycin (VANCOCIN) IVPB 1000 mg/200 mL premix     1,000 mg 200 mL/hr over 60 Minutes Intravenous  Once 06/21/16 1133 06/21/16 1340   06/21/16 1145  piperacillin-tazobactam (ZOSYN) IVPB 3.375 g     3.375 g 100 mL/hr over 30 Minutes Intravenous  Once 06/21/16 1133 06/21/16 1236      Assessment/Plan:  50 year old female admitted with abdominal cellulitis and urinary tract infection. Appears to be improving on antibiotics. No plans for operative intervention at this time. Okay from a surgery standpoint to transfer oral antibiotics when indicated by the medicine team. Patient will need follow-up as an outpatient for wound check later this week if discharged.  Pat Elicker T. Tonita CongWoodham, MD, FACS  06/23/2016

## 2016-06-23 NOTE — Care Management (Addendum)
[facility] with [facility]. Lives at home with her mom.

## 2016-06-23 NOTE — NC FL2 (Signed)
[place] MEDICAID FL2 LEVEL OF CARE SCREENING TOOL     IDENTIFICATION  Patient Name: Sabrina Espinoza Birthdate: 1965/09/13 Sex: female Admission Date (Current Location): 06/21/2016  [facility] and [place] Number:  [address]  ([id number] [facility]) Facility and Address:  [facility], [address] [place], [place] [address]      Provider Number: [id number]  Attending Physician Name and Address:  Auburn Bilberry, MD  Relative Name and Phone Number:       Current Level of Care: Hospital Recommended Level of Care: Skilled Nursing Facility Prior Approval Number:    Date Approved/Denied:   PASRR Number:    Discharge Plan: SNF    Current Diagnoses: Patient Active Problem List   Diagnosis Date Noted  . Abdominal pain 06/21/2016  . Cellulitis of abdominal wall 06/21/2016  . UTI (urinary tract infection) 06/21/2016  . Sepsis (HCC) 06/21/2016  . Acute cystitis without hematuria   . Fever 06/20/2016  . Postoperative intra-abdominal abscess (HCC) 06/13/2016  . Diverticulitis large intestine 04/17/2016  . Diverticulitis of colon with perforation   . Pallor 03/11/2016  . Tongue papillae atrophy 03/11/2016  . Encounter for medication monitoring 03/11/2016  . Peripheral neuropathy (HCC) 03/11/2016  . Thrush 03/11/2016  . Primary insomnia 02/04/2016  . Right cervical radiculopathy 01/15/2016  . Chronic insomnia 11/02/2015  . Right knee pain 08/22/2015  . Midline low back pain without sciatica 05/07/2015  . Elevated blood pressure 04/20/2015  . Polypharmacy 03/22/2015  . Foot pain, right 03/16/2015  . Pain pharynx 03/08/2015  . GERD without esophagitis 03/08/2015  . Abnormal mammogram of right breast 03/07/2015  . Degenerative disc disease, cervical 02/26/2015  . Bilateral leg cramps 02/26/2015  . Skin lesions, generalized 02/26/2015  . Allergic rhinitis with postnasal drip 01/31/2015  . Chronic sinusitis with recurrent bronchitis 01/31/2015  . Oral thrush  01/31/2015  . History of cocaine use 01/16/2015  . 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 Chronic hepatitis C without hepatic coma (HCC) 01/16/2015  . Chronic constipation 01/16/2015  . COPD, moderate (HCC) 01/16/2015  . Cigarette smoker 01/16/2015  . Bipolar 1 disorder, mixed, partial remission (HCC) 01/16/2015  . Migraine without aura and without status migrainosus, not intractable 01/16/2015  . Depression with anxiety 01/16/2015  . Cephalalgia 03/06/2014  . Imbalance 03/06/2014  . Insomnia due to medical condition 03/06/2014    Orientation RESPIRATION BLADDER Height & Weight     Self, Time, Situation, Place  Normal Continent Weight: 167 lb 3.2 oz (75.8 kg) Height:  5\' 9"  (175.3 cm)  BEHAVIORAL SYMPTOMS/MOOD NEUROLOGICAL BOWEL NUTRITION STATUS   (none)  (none) Colostomy, Incontinent Diet (Diet: Regular )  AMBULATORY STATUS COMMUNICATION OF NEEDS Skin   Extensive Assist Verbally Other (Comment) (incisino: abdomen )                       Personal Care Assistance Level of Assistance  Bathing, Feeding, Dressing Bathing Assistance: Limited assistance Feeding assistance: Independent Dressing Assistance: Limited assistance     Functional Limitations Info  Sight, Hearing, Speech Sight Info: Adequate Hearing Info: Adequate Speech Info: Adequate    SPECIAL CARE FACTORS FREQUENCY  PT (By licensed PT), OT (By licensed OT)     PT Frequency:  (5) OT Frequency:  (5)            Contractures      Additional Factors Info  Code Status, Allergies Code Status Info:  (Full Code. ) Allergies Info:  (No Known Allergies. )  Current Medications (06/23/2016):  This is the current hospital active medication list Current Facility-Administered Medications  Medication Dose Route Frequency Provider Last Rate Last Dose  . 0.9 % NaCl with KCl 20 mEq/ L  infusion   Intravenous Continuous Marguarite ArbourJeffrey D Sparks, MD 100 mL/hr at 06/23/16 [phone]    . acetaminophen (TYLENOL)  tablet 650 mg  650 mg Oral Q6H PRN Marguarite ArbourJeffrey D Sparks, MD   650 mg at 06/22/16 [phone]   Or  . acetaminophen (TYLENOL) suppository 650 mg  650 mg Rectal Q6H PRN Marguarite ArbourJeffrey D Sparks, MD      . albuterol (PROVENTIL) (2.5 MG/3ML) 0.083% nebulizer solution 3 mL  3 mL Inhalation Q4H PRN Marguarite ArbourJeffrey D Sparks, MD      . azelastine (ASTELIN) 0.1 % nasal spray 1 spray  1 spray Each Nare BID Marguarite ArbourJeffrey D Sparks, MD   1 spray at 06/23/16 [phone]  . bisacodyl (DULCOLAX) suppository 10 mg  10 mg Rectal Daily PRN Marguarite ArbourJeffrey D Sparks, MD      . butalbital-acetaminophen-caffeine (FIORICET, ESGIC) [phone] MG per tablet 1 tablet  1 tablet Oral Q4H PRN Marguarite ArbourJeffrey D Sparks, MD      . clonazePAM Scarlette Calico(KLONOPIN) tablet 1 mg  1 mg Oral TID Marguarite ArbourJeffrey D Sparks, MD   1 mg at 06/23/16 0851  . cyclobenzaprine (FLEXERIL) tablet 10 mg  10 mg Oral TID PRN Marguarite ArbourJeffrey D Sparks, MD      . diltiazem (CARDIZEM CD) 24 hr capsule 180 mg  180 mg Oral Daily Marguarite ArbourJeffrey D Sparks, MD   180 mg at 06/23/16 0851  . dimenhyDRINATE (DRAMAMINE) tablet 50 mg  50 mg Oral Q8H PRN Marguarite ArbourJeffrey D Sparks, MD      . docusate sodium (COLACE) capsule 100 mg  100 mg Oral BID Marguarite ArbourJeffrey D Sparks, MD   100 mg at 06/23/16 0851  . famotidine (PEPCID) IVPB 20 mg premix  20 mg Intravenous Q12H Marguarite ArbourJeffrey D Sparks, MD   20 mg at 06/23/16 [phone]  . fluticasone (FLONASE) 50 MCG/ACT nasal spray 1 spray  1 spray Each Nare BID Marguarite ArbourJeffrey D Sparks, MD   1 spray at 06/23/16 [phone]  . gabapentin (NEURONTIN) capsule 400 mg  400 mg Oral TID Marguarite ArbourJeffrey D Sparks, MD   400 mg at 06/23/16 0851  . heparin injection 5,000 Units  5,000 Units Subcutaneous Q8H Marguarite ArbourJeffrey D Sparks, MD   5,000 Units at 06/23/16 0505  . HYDROmorphone (DILAUDID) injection 1 mg  1 mg Intravenous Q2H PRN Marguarite ArbourJeffrey D Sparks, MD   1 mg at 06/23/16 1134  . hydrOXYzine (ATARAX/VISTARIL) tablet 50 mg  50 mg Oral BID Marguarite ArbourJeffrey D Sparks, MD   50 mg at 06/23/16 [phone]  . lamoTRIgine (LAMICTAL) tablet 300 mg  300 mg Oral Daily Marguarite ArbourJeffrey D Sparks, MD   300 mg at 06/23/16 [phone]  .  mometasone-formoterol (DULERA) 200-5 MCG/ACT inhaler 2 puff  2 puff Inhalation BID Marguarite ArbourJeffrey D Sparks, MD   2 puff at 06/23/16 [phone]  . montelukast (SINGULAIR) tablet 10 mg  10 mg Oral QHS Marguarite ArbourJeffrey D Sparks, MD   10 mg at 06/22/16 2056  . morphine 4 MG/ML injection 2 mg  2 mg Intravenous Q2H PRN Marguarite ArbourJeffrey D Sparks, MD   2 mg at 06/21/16 1808  . nicotine (NICODERM CQ - dosed in mg/24 hours) patch 21 mg  21 mg Transdermal Daily Marguarite ArbourJeffrey D Sparks, MD   21 mg at 06/23/16 0853  . nortriptyline (PAMELOR) capsule 100 mg  100 mg Oral QHS Marguarite ArbourJeffrey D Sparks, MD  100 mg at 06/22/16 2120  . ondansetron (ZOFRAN) tablet 4 mg  4 mg Oral Q6H PRN Marguarite ArbourJeffrey D Sparks, MD   4 mg at 06/21/16 2156   Or  . ondansetron (ZOFRAN) injection 4 mg  4 mg Intravenous Q6H PRN Marguarite ArbourJeffrey D Sparks, MD      . ondansetron [facility](ZOFRAN) tablet 4 mg  4 mg Oral Q6H PRN Marguarite ArbourJeffrey D Sparks, MD      . oxyCODONE (Oxy IR/ROXICODONE) immediate release tablet 10 mg  10 mg Oral Q4H PRN Marguarite ArbourJeffrey D Sparks, MD   10 mg at 06/23/16 1034  . pantoprazole (PROTONIX) EC tablet 40 mg  40 mg Oral BH-q7a Marguarite ArbourJeffrey D Sparks, MD   40 mg at 06/23/16 0506  . piperacillin-tazobactam (ZOSYN) IVPB 3.375 g  3.375 g Intravenous Q8H Sheema M Hallaji, RPH   3.375 g at 06/23/16 0505  . prazosin (MINIPRESS) capsule 5 mg  5 mg Oral QHS Marguarite ArbourJeffrey D Sparks, MD   5 mg at 06/22/16 2056  . sodium chloride 0.9 % bolus 250 mL  250 mL Intravenous Once Jene Everyobert Kinner, MD   Stopped at 06/21/16 1338  . vancomycin (VANCOCIN) IVPB 1000 mg/200 mL premix  1,000 mg Intravenous Q12H Sheema M Hallaji, RPH   1,000 mg at 06/23/16 [id number]  . zolpidem (AMBIEN) tablet 5 mg  5 mg Oral QHS PRN Marguarite ArbourJeffrey D Sparks, MD   5 mg at 06/22/16 2224     Discharge Medications: Please see discharge summary for a list of discharge medications.  Relevant Imaging Results:  Relevant Lab Results:   Additional Information  (SSN: [id number])  Markeise Mathews, Darleen CrockerBailey M, LCSW

## 2016-06-23 NOTE — Progress Notes (Signed)
Pharmacy Antibiotic Note  Sabrina Espinoza is a 50 y.o. female admitted on 06/21/2016 with cellulitis.  Pharmacy has been consulted for vancomycin and Zosyn  Dosing. Patient received 1 dose Zosyn and Vancomycin IV in ED.   Plan: Ke: 0.079   T1/2: 8.8   Vd: 53     Will start the patient on Vancomycin 1gm IV every 12hours with 6 hours stack dosing. Trough level drawn prior to 5th dose.  Goal trough: 10-15  12/18 Vanc trough at 0746 = 14, approaching steady state; within goal range of 10-15 mcg/ml. Will continue current dose of vancomycin 1000 mg IV q12h. Will order next trough for 12/20 at 0730, which should be at [facility].    Will continue Zosyn 3.375 IV EI every 8 hours.    Height: 5\' 9"  (175.3 cm) Weight: 167 lb 3.2 oz (75.8 kg) IBW/kg (Calculated) : 66.2  Temp (24hrs), Avg:98.2 F (36.8 C), Min:97.9 F (36.6 C), Max:98.6 F (37 C)   Recent Labs Lab 06/20/16 1612 06/21/16 1126 06/22/16 0436 06/23/16 0746  WBC 15.6* 15.7* 9.8  --   CREATININE 0.92 0.75 0.69  --   LATICACIDVEN  --  1.5  --   --   VANCOTROUGH  --   --   --  14*    Estimated Creatinine Clearance: 90.9 mL/min (by C-G formula based on SCr of 0.69 mg/dL).    No Known Allergies  Antimicrobials this admission: 12/16 Vancomycin  >>  12/16 Zosyn  >>   Dose adjustments this admission:  Microbiology results: 12/15 UCx: 60K group G strep 12/16 BCx: NGTD 12/16 UCx: <10k insig growth   Thank you for allowing pharmacy to be a part of this patient's care.  Marty HeckWang, Zaccai Chavarin L, PharmD, BCPS Clinical Pharmacist 06/23/2016 8:28 AM

## 2016-06-23 NOTE — Progress Notes (Signed)
PT is recommending SNF. Clinical Social Worker (CSW) met with patient to discuss D/C plan. Patient was adamant about going home and reported that she can walk and take care of herself. 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 Patient reported that she needed rehab then but does not need it now. CSW explained the benefits of SNF. Patient continued to refuse SNF. RN case manager aware of above. CSW will continue to follow and assist as needed.   [employer], LCSW [phone]

## 2016-06-23 NOTE — Progress Notes (Signed)
Initial Nutrition Assessment  DOCUMENTATION CODES:   Not applicable  INTERVENTION:  1. Ensure Enlive po BID, each supplement provides 350 kcal and 20 grams of protein  NUTRITION DIAGNOSIS:   Inadequate oral intake related to poor appetite as evidenced by per patient/family report  GOAL:   Patient will meet greater than or equal to 90% of their needs   MONITOR:   PO intake, I & O's, Labs, Weight trends  REASON FOR ASSESSMENT:   Malnutrition Screening Tool    ASSESSMENT:   Pt admited with abdominal cellutlis and uti, Was in the hospital 2 days a and left AMA went for follow-up with surgery and was noted to have abdominal cellulitis therefore referred for admission  Spoke with pt at bedside.  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 Per chart Wt is down 15#/8.2% over 2 months, which is significant for time frame. She ate 0% x2 meals, but ate 75% at [employer] today - had Liberty MutualFried Chicken, Green Beans, Mashed Potatoes. Reports some nausea, denies vomiting Denies any issues chewing/swallowing/choking  Nutrition-Focused physical exam completed. Findings are mild fat depletion, moderate muscle depletion, and no edema.   No acute complaints at this time  Labs and medications reviewed: K 3.4 Colace, MIralax, KCL  Diet Order:  Diet regular Room service appropriate? Yes; Fluid consistency: Thin  Skin:  Wound (see comment) (Old abd wound)  Last BM:  06/19/2016  Height:   Ht Readings from Last 1 Encounters:  06/21/16 5\' 9"  (1.753 m)    Weight:   Wt Readings from Last 1 Encounters:  06/23/16 167 lb 3.2 oz (75.8 kg)    Ideal Body Weight:  65.9 kg  BMI:  Body mass index is 24.69 kg/m.  Estimated Nutritional Needs:   Kcal:  [id number] calories (MSJ x1-1.1)  Protein:  75-91 gm  Fluid:  >/= 1.45L  EDUCATION NEEDS:   No education needs identified at this time  Dionne AnoWilliam M. Harlow Basley, MS, RD LDN Inpatient  Clinical Dietitian Pager [phone]

## 2016-06-23 NOTE — Consult Note (Signed)
[facility] Face-to-Face Psychiatry Consult   Reason for Consult:  49 year old woman with a history of mental health issues currently in the hospital for fever related to surgery. Question about capacity Referring Physician:  Posey Pronto Patient Identification: Sabrina Espinoza MRN:  [id number] Principal Diagnosis: Sepsis [facility]) Diagnosis:   Patient Active Problem List   Diagnosis Date Noted  . Abdominal pain [R10.9] 06/21/2016  . Cellulitis of abdominal wall [L03.311] 06/21/2016  . UTI (urinary tract infection) [N39.0] 06/21/2016  . Sepsis ([place]) [A41.9] 06/21/2016  . Acute cystitis without hematuria [N30.00]   . Fever [R50.9] 06/20/2016  . Postoperative intra-abdominal abscess ([place]) [T81.4XXA, K65.1] 06/13/2016  . Diverticulitis large intestine [K57.32] 04/17/2016  . Diverticulitis of colon with perforation [K57.20]   . Pallor [R23.1] 03/11/2016  . Tongue papillae atrophy [K14.4] 03/11/2016  . Encounter for medication monitoring [Z51.81] 03/11/2016  . Peripheral neuropathy ([place]) [G62.9] 03/11/2016  . Thrush [B37.0] 03/11/2016  . Primary insomnia [F51.01] 02/04/2016  . Right cervical radiculopathy [M54.12] 01/15/2016  . Chronic insomnia [F51.04] 11/02/2015  . Right knee pain [M25.561] 08/22/2015  . Midline low back pain without sciatica [M54.5] 05/07/2015  . Elevated blood pressure [IMO0001] 04/20/2015  . Polypharmacy [Z79.899] 03/22/2015  . Foot pain, right [M79.671] 03/16/2015  . Pain pharynx [J39.2] 03/08/2015  . GERD without esophagitis [K21.9] 03/08/2015  . Abnormal mammogram of right breast [R92.8] 03/07/2015  . Degenerative disc disease, cervical [M50.30] 02/26/2015  . Bilateral leg cramps [R25.2] 02/26/2015  . Skin lesions, generalized [L98.9] 02/26/2015  . Allergic rhinitis with postnasal drip [J30.9, R09.82] 01/31/2015  . Chronic sinusitis with recurrent bronchitis [J32.9, J40] 01/31/2015  . Oral thrush [B37.0] 01/31/2015  . History of cocaine use [Z87.898] 01/16/2015  . Nontoxic  multinodular goiter [E04.2] 01/16/2015  . Chronic hepatitis C without hepatic coma ([place]) [B18.2] 01/16/2015  . Chronic constipation [K59.09] 01/16/2015  . COPD, moderate ([place]) [J44.9] 01/16/2015  . Cigarette smoker [F17.210] 01/16/2015  . Bipolar 1 disorder, mixed, partial remission ([place]) [F31.77] 01/16/2015  . Migraine without aura and without status migrainosus, not intractable [G43.009] 01/16/2015  . Depression with anxiety [F41.8] 01/16/2015  . Cephalalgia [R51] 03/06/2014  . Imbalance [R26.89] 03/06/2014  . Insomnia due to medical condition [G47.01] 03/06/2014    Total Time spent with patient: 1 hour  Subjective:   Sabrina Espinoza is a 50 y.o. female patient admitted with "I've really been very sick".  HPI:  Patient interviewed. Chart reviewed. 50 year old woman with multiple medical problems currently in the hospital after coming in febrile and with some infections and complications of surgery. Multiple other medical problems. The question at hand is about disposition. PT has recommended skilled nursing. Patient is insisting to go back to her own home. Patient currently says that her mood is feeling fine. She says she is working hard not to "show her butt". She sleeping better. Appetite has improved. Patient denies any suicidal or homicidal ideation. Denies any psychosis. She is compliant with medication. Patient understands the reasoning of physical therapy. She understands that they do not want her to fall and get hurt and that there is obviously more risk of that if she is living independently. She says that she lives by herself in her own apartment but that the apartment is small and she is familiar with it. Furthermore she says she has several home health nurses to come in at various times as well as family who help her out regularly. Been to rehabilitation in the past and finds it unpleasant and thinks that she would  much prefer to be at home despite the increased risk.  Social  history: Lives by herself in an apartment. Says she has a very good "support system" from her church family and her mother and son as well as having home health assistance.  Medical history: Patient is in the hospital for sepsis related to abdominal surgery. Multiple other chronic medical problems.  Substance abuse history: History of cocaine abuse. She says that when she is motivated to stop using drugs she can do it on her own and she hasn't used any drugs in many months.  Past Psychiatric History: Long-standing mental health issues. Diagnosis of bipolar disorder. Has seen a psychiatrist for years and is on stable medicine. She says that she has never had any inpatient psychiatric treatment. Denies ever having tried to kill her self in the past. Denies any history of psychosis. Patient seems to have an understanding that her mood can come across as irritable touchy and impulsive. She knows that she can get herself in trouble with caregivers when she gets impulsive and angry but she is also able to describe pretty clearly the reasons why it happens.    Risk to Self: Is patient at risk for suicide?: No Risk to Others:   Prior Inpatient Therapy:   Prior Outpatient Therapy:    Past Medical History:  Past Medical History:  Diagnosis Date  . Anxiety   . Chronic constipation    on Linzess, Miralax, Zofran  . Chronic insomnia 11/02/2015  . COPD, moderate ([place])   . Depression   . Depression with anxiety    Followed by Dr. Myer Haff, psychiatrist at [facility] and Eloisa [facility] for [place]  . History of chicken pox   . History of cocaine use   . History of pneumonia   . Hypothyroidism, adult    Working with Dr. Belinda Fisher, Endocrinology. S/P RAIU and is planning on biopsy  . Migraine without status migrainosus, not intractable   . Nicotine dependence, uncomplicated   . Nontoxic multinodular goiter    FNA done 10/30/14 with benign results. Seen [name]. Patient may want a  second opinion.  . Rhinitis, allergic   . Vitamin D deficiency     Past Surgical History:  Procedure Laterality Date  . ANKLE FRACTURE SURGERY  1988   rods and pins in place  . APPENDECTOMY  04/17/2016   Procedure: APPENDECTOMY;  Surgeon: Jules Husbands, MD;  Location: [facility] ORS;  Service: General;;  . [place]  . COLECTOMY WITH COLOSTOMY CREATION/HARTMANN PROCEDURE N/A 04/17/2016   Procedure: COLOSTOMY CREATION/HARTMANN PROCEDURE;  Surgeon: Jules Husbands, MD;  Location: [facility] ORS;  Service: General;  Laterality: N/A;  . ECTOPIC PREGNANCY SURGERY  2003  . LAPAROTOMY N/A 04/17/2016   Procedure: EXPLORATORY LAPAROTOMY;  Surgeon: Jules Husbands, MD;  Location: [facility] ORS;  Service: General;  Laterality: N/A;  . NECK SURGERY    . SHOULDER SURGERY     Family History:  Family History  Problem Relation Age of Onset  . Heart disease Father   . Alcohol abuse Father   . Diabetes Father   . Heart disease Brother   . Depression Brother   . Stroke Brother   . Alcohol abuse Paternal Grandfather   . Osteoporosis Mother   . Breast cancer Neg Hx    Family Psychiatric  History: She says there is a positive family history of depression no history of suicide in the family. Social History:  History  Alcohol Use  No     History  Drug Use No    Social History   Social History  . Marital status: Single    Spouse name: N/A  . Number of children: N/A  . Years of education: N/A   Social History Main Topics  . Smoking status: Current Every Day Smoker    Packs/day: 0.75    Years: 32.00    Types: Cigarettes  . Smokeless tobacco: Never Used  . Alcohol use No  . Drug use: No  . Sexual activity: No   Other Topics Concern  . None   Social History Narrative  . None   Additional Social History:    Allergies:  No Known Allergies  Labs:  Results for orders placed or performed during the hospital encounter of 06/21/16 (from the past 48 hour(s))  Comprehensive metabolic panel      Status: Abnormal   Collection Time: 06/22/16  4:36 AM  Result Value Ref Range   Sodium 135 135 - 145 mmol/L   Potassium 3.0 (L) 3.5 - 5.1 mmol/L   Chloride 107 101 - 111 mmol/L   CO2 23 22 - 32 mmol/L   Glucose, Bld 92 65 - 99 mg/dL   BUN 6 6 - 20 mg/dL   Creatinine, Ser 0.69 0.44 - 1.00 mg/dL   Calcium 7.6 (L) 8.9 - 10.3 mg/dL   Total Protein 6.3 (L) 6.5 - 8.1 g/dL   Albumin 2.8 (L) 3.5 - 5.0 g/dL   AST 22 15 - 41 U/L   ALT 17 14 - 54 U/L   Alkaline Phosphatase 78 38 - 126 U/L   Total Bilirubin 0.7 0.3 - 1.2 mg/dL   GFR calc non Af Amer >60 >60 mL/min   GFR calc Af Amer >60 >60 mL/min    Comment: (NOTE) The eGFR has been calculated using the CKD EPI equation. This calculation has not been validated in all clinical situations. eGFR's persistently <60 mL/min signify possible Chronic Kidney Disease.    Anion gap 5 5 - 15  CBC     Status: Abnormal   Collection Time: 06/22/16  4:36 AM  Result Value Ref Range   WBC 9.8 3.6 - 11.0 K/uL   RBC 3.33 (L) 3.80 - 5.20 MIL/uL   Hemoglobin 9.6 (L) 12.0 - 16.0 g/dL   HCT 28.1 (L) 35.0 - 47.0 %   MCV 84.2 80.0 - 100.0 fL   MCH 28.7 26.0 - 34.0 pg   MCHC 34.1 32.0 - 36.0 g/dL   RDW 16.1 (H) 11.5 - 14.5 %   Platelets 283 150 - 440 K/uL  Vancomycin, trough     Status: Abnormal   Collection Time: 06/23/16  7:46 AM  Result Value Ref Range   Vancomycin Tr 14 (L) 15 - 20 ug/mL  CBC with Differential/Platelet     Status: Abnormal   Collection Time: 06/23/16  7:46 AM  Result Value Ref Range   WBC 5.9 3.6 - 11.0 K/uL   RBC 3.11 (L) 3.80 - 5.20 MIL/uL   Hemoglobin 8.8 (L) 12.0 - 16.0 g/dL   HCT 26.2 (L) 35.0 - 47.0 %   MCV 84.3 80.0 - 100.0 fL   MCH 28.4 26.0 - 34.0 pg   MCHC 33.7 32.0 - 36.0 g/dL   RDW 16.2 (H) 11.5 - 14.5 %   Platelets 280 150 - 440 K/uL   Neutrophils Relative % 46 %   Lymphocytes Relative 36 %   Monocytes Relative 10 %   Eosinophils Relative 7 %  Basophils Relative 1 %   Neutro Abs 2.7 1.4 - 6.5 K/uL   Lymphs  Abs 2.1 1.0 - 3.6 K/uL   Monocytes Absolute 0.6 0.2 - 0.9 K/uL   Eosinophils Absolute 0.4 0 - 0.7 K/uL   Basophils Absolute 0.1 0 - 0.1 K/uL  Basic metabolic panel     Status: Abnormal   Collection Time: 06/23/16  7:46 AM  Result Value Ref Range   Sodium 137 135 - 145 mmol/L   Potassium 3.4 (L) 3.5 - 5.1 mmol/L   Chloride 110 101 - 111 mmol/L   CO2 22 22 - 32 mmol/L   Glucose, Bld 87 65 - 99 mg/dL   BUN <5 (L) 6 - 20 mg/dL   Creatinine, Ser 7.42 0.44 - 1.00 mg/dL   Calcium 8.0 (L) 8.9 - 10.3 mg/dL   GFR calc non Af Amer >60 >60 mL/min   GFR calc Af Amer >60 >60 mL/min    Comment: (NOTE) The eGFR has been calculated using the CKD EPI equation. This calculation has not been validated in all clinical situations. eGFR's persistently <60 mL/min signify possible Chronic Kidney Disease.    Anion gap 5 5 - 15    Current Facility-Administered Medications  Medication Dose Route Frequency Provider Last Rate Last Dose  . acetaminophen (TYLENOL) tablet 650 mg  650 mg Oral Q6H PRN Marguarite Arbour, MD   650 mg at 06/22/16 1782   Or  . acetaminophen (TYLENOL) suppository 650 mg  650 mg Rectal Q6H PRN Marguarite Arbour, MD      . albuterol (PROVENTIL) (2.5 MG/3ML) 0.083% nebulizer solution 3 mL  3 mL Inhalation Q4H PRN Marguarite Arbour, MD      . azelastine (ASTELIN) 0.1 % nasal spray 1 spray  1 spray Each Nare BID Marguarite Arbour, MD   1 spray at 06/23/16 [phone]  . bisacodyl (DULCOLAX) suppository 10 mg  10 mg Rectal Daily PRN Marguarite Arbour, MD      . butalbital-acetaminophen-caffeine (FIORICET, ESGIC) [id number] MG per tablet 1 tablet  1 tablet Oral Q4H PRN Marguarite Arbour, MD      . clonazePAM Scarlette Calico) tablet 1 mg  1 mg Oral TID Marguarite Arbour, MD   1 mg at 06/23/16 1521  . cyclobenzaprine (FLEXERIL) tablet 10 mg  10 mg Oral TID PRN Marguarite Arbour, MD      . diltiazem (CARDIZEM CD) 24 hr capsule 180 mg  180 mg Oral Daily Marguarite Arbour, MD   180 mg at 06/23/16 0851  . dimenhyDRINATE  (DRAMAMINE) tablet 50 mg  50 mg Oral Q8H PRN Marguarite Arbour, MD      . docusate sodium (COLACE) capsule 100 mg  100 mg Oral BID Marguarite Arbour, MD   100 mg at 06/23/16 0851  . enoxaparin (LOVENOX) injection 40 mg  40 mg Subcutaneous Q24H Auburn Bilberry, MD      . feeding supplement (ENSURE ENLIVE) (ENSURE ENLIVE) liquid 237 mL  237 mL Oral BID BM Auburn Bilberry, MD   237 mL at 06/23/16 1521  . fluticasone (FLONASE) 50 MCG/ACT nasal spray 1 spray  1 spray Each Nare BID Marguarite Arbour, MD   1 spray at 06/23/16 [phone]  . gabapentin (NEURONTIN) capsule 400 mg  400 mg Oral TID Marguarite Arbour, MD   400 mg at 06/23/16 1521  . HYDROmorphone (DILAUDID) injection 1 mg  1 mg Intravenous Q2H PRN Marguarite Arbour, MD   1 mg at  06/23/16 1814  . hydrOXYzine (ATARAX/VISTARIL) tablet 50 mg  50 mg Oral BID Idelle Crouch, MD   50 mg at 06/23/16 0998  . lamoTRIgine (LAMICTAL) tablet 300 mg  300 mg Oral Daily Idelle Crouch, MD   300 mg at 06/23/16 3382  . mometasone-formoterol (DULERA) 200-5 MCG/ACT inhaler 2 puff  2 puff Inhalation BID Idelle Crouch, MD   2 puff at 06/23/16 [phone]  . montelukast (SINGULAIR) tablet 10 mg  10 mg Oral QHS Idelle Crouch, MD   10 mg at 06/22/16 2056  . nicotine (NICODERM CQ - dosed in mg/24 hours) patch 21 mg  21 mg Transdermal Daily Idelle Crouch, MD   21 mg at 06/23/16 0853  . nortriptyline (PAMELOR) capsule 100 mg  100 mg Oral QHS Idelle Crouch, MD   100 mg at 06/22/16 2120  . ondansetron (ZOFRAN) tablet 4 mg  4 mg Oral Q6H PRN Idelle Crouch, MD   4 mg at 06/21/16 2156   Or  . ondansetron (ZOFRAN) injection 4 mg  4 mg Intravenous Q6H PRN Idelle Crouch, MD      . ondansetron [facility]) tablet 4 mg  4 mg Oral Q6H PRN Idelle Crouch, MD      . oxyCODONE (Oxy IR/ROXICODONE) immediate release tablet 10 mg  10 mg Oral Q4H PRN Idelle Crouch, MD   10 mg at 06/23/16 1955  . pantoprazole (PROTONIX) EC tablet 40 mg  40 mg Oral BH-q7a Idelle Crouch, MD   40 mg at  06/23/16 0506  . piperacillin-tazobactam (ZOSYN) IVPB 3.375 g  3.375 g Intravenous Q8H Sheema M Hallaji, RPH   3.375 g at 06/23/16 1320  . polyethylene glycol (MIRALAX / GLYCOLAX) packet 17 g  17 g Oral Daily Dustin Flock, MD   17 g at 06/23/16 1526  . prazosin (MINIPRESS) capsule 5 mg  5 mg Oral QHS Idelle Crouch, MD   5 mg at 06/22/16 2056  . sodium chloride 0.9 % bolus 250 mL  250 mL Intravenous Once Lavonia Drafts, MD   Stopped at 06/21/16 1338  . vancomycin (VANCOCIN) IVPB 1000 mg/200 mL premix  1,000 mg Intravenous Q12H Sheema M Hallaji, RPH   1,000 mg at 06/23/16 9767  . zolpidem (AMBIEN) tablet 5 mg  5 mg Oral QHS PRN Idelle Crouch, MD   5 mg at 06/22/16 2224    Musculoskeletal: Strength & Muscle Tone: decreased Gait & Station: unsteady Patient leans: N/A  Psychiatric Specialty Exam: Physical Exam  Nursing note and vitals reviewed. Constitutional: She appears well-developed and well-nourished.  HENT:  Head: Normocephalic and atraumatic.  Eyes: Conjunctivae are normal. Pupils are equal, round, and reactive to light.  Neck: Normal range of motion.  Cardiovascular: Normal heart sounds.   Respiratory: Effort normal.  GI: Soft.    Musculoskeletal: Normal range of motion.  Neurological: She is alert.  Skin: Skin is warm and dry.  Psychiatric: She has a normal mood and affect. Her speech is normal and behavior is normal. Judgment and thought content normal. Cognition and memory are normal.    Review of Systems  Constitutional: Positive for malaise/fatigue.  HENT: Negative.   Eyes: Negative.   Respiratory: Negative.   Cardiovascular: Negative.   Gastrointestinal: Positive for abdominal pain.  Musculoskeletal: Negative.   Skin: Negative.   Neurological: Positive for weakness.  Psychiatric/Behavioral: Negative for depression, hallucinations, memory loss, substance abuse and suicidal ideas. The patient is not nervous/anxious and does not have  insomnia.     Blood  pressure 128/85, pulse 84, temperature 97.9 F (36.6 C), resp. rate 18, height _0  (1.753 m), weight 75.8 kg (167 lb 3.2 oz), SpO2 100 %.Body mass index is 24.69 kg/m.  General Appearance: Fairly Groomed  Eye Contact:  Good  Speech:  Normal Rate  Volume:  Increased  Mood:  Euthymic  Affect:  Full Range  Thought Process:  Goal Directed  Orientation:  Full (Time, Place, and Person)  Thought Content:  Logical  Suicidal Thoughts:  No  Homicidal Thoughts:  No  Memory:  Immediate;   Good Recent;   Good Remote;   Fair  Judgement:  Fair  Insight:  Fair  Psychomotor Activity:  Normal  Concentration:  Concentration: Fair  Recall:  Good  Fund of Knowledge:  Good  Language:  Good  Akathisia:  No  Handed:  Right  AIMS (if indicated):     Assets:  Communication Skills Desire for Improvement Financial Resources/Insurance Housing Resilience Social Support  ADL's:  Intact  Cognition:  WNL  Sleep:        Treatment Plan Summary: Plan This is a 50 year old woman with a history of some chronic mental health issues. One can clearly see in talking with her how her emotions can get rapidly escalated and potentially get irritable. I made a real effort to explain to her upfront with the purpose of the examination was and to make it clear to her that I did not come in with any presumption one way or the other. Based on my evaluation the patient does have capacity to make decisions. She understands the reasoning behind the physical therapy recommendation and knows that there is a risk of falls and injury at home but much prefers to be there and be independent.no indication for psychiatric hospitalization or change to psychiatric medicine. Supportive counseling. Follow-up as needed only. She will continue to see her regular psychiatrist outside the hospital.  Disposition: Patient does not meet criteria for psychiatric inpatient admission. Supportive therapy provided about ongoing stressors. Patient  does have capacity to make decisions  Alethia Berthold, MD 06/23/2016 8:19 PM

## 2016-06-24 LAB — BASIC METABOLIC PANEL
ANION GAP: 6 (ref 5–15)
BUN: <5 mg/dL — ABNORMAL LOW (ref 6–20)
CALCIUM: 8.7 mg/dL — AB (ref 8.9–10.3)
CHLORIDE: 106 mmol/L (ref 101–111)
CO2: 25 mmol/L (ref 22–32)
Creatinine, Ser: 0.64 mg/dL (ref 0.44–1.00)
GFR calc non Af Amer: >60 mL/min (ref >60)
GLUCOSE: 109 mg/dL — AB (ref 65–99)
Potassium: 2.9 mmol/L — ABNORMAL LOW (ref 3.5–5.1)
Sodium: 137 mmol/L (ref 135–145)

## 2016-06-24 LAB — MAGNESIUM: Magnesium: 1.7 mg/dL (ref 1.7–2.4)

## 2016-06-24 NOTE — Discharge Planning (Signed)
Patient ripped IV out and fled the hospital AMA.  ChiropodistAssistant Director and Doctor notified.  Patient mom also contacted.

## 2016-06-24 NOTE — Discharge Planning (Signed)
Patient continues to verbally abuse staff and insist upon getting her IV pain meds.  3rd shift had difficulty keeping patient orientation and BP under control and therefore RN informed patient at shift change that we were unable to give further dilaudid d/t her vitals and AMS.  Patient demanding to be discharged and is extremely angry that she has a bed alarm on. Patient not stable on feet, has slurred speech and is refusing PT this AM.  Still continues to get up without asking for help and wrapping self in IV tubes.  Dr. Informed and asked to consider discharge if possible.

## 2016-06-24 NOTE — Progress Notes (Signed)
CH was requested to visit the Pt. CH visited Pt. Pt looked disturbed. Pt talked with CH brief, and then [facility].    06/24/16 1500  Clinical Encounter Type  Visited With Patient  Visit Type Initial  Referral From Nurse  Consult/Referral To Chaplain  Spiritual Encounters  Spiritual Needs Emotional;Other (Comment)

## 2016-06-24 NOTE — Progress Notes (Signed)
Per Psych MD note "Based on my evaluation the patient does have capacity to make decisions." RN case manager will arrange home health. Patient is refusing SNF.   Baker Hughes IncorporatedBailey Joron Velis, LCSW [phone]

## 2016-06-24 NOTE — Progress Notes (Signed)
PT Cancellation Note  Patient Details Name: Sabrina Espinoza MRN: [id number] DOB: August 28, 1965   Cancelled Treatment:    Reason Eval/Treat Not Completed: Other (comment). Pt found out of bed, alarm sounding with IV tubing stretched to capacity standing in the dark rummaging through pt's bag. Nurse entered room after therapist. Pt shouting, swearing and threatening staff that she will leave AMA because she does not need to deal with how she is being treated. Pt also accusing staff of stealing her phone charger. Attempted to talk with pt and offer PT for proper recommendations for discharge; pt continues to yell and swear and then proceeds to make a phone call. No further attempts. Pt continues to get out of bed despite instruction to have staff with her. Nurse agreed to contact doctor for pt and attain discharge orders.    Scot DockHeidi E Barnes, PTA 06/24/2016, 11:23 AM

## 2016-06-25 LAB — CULTURE, BLOOD (ROUTINE X 2)
CULTURE: NO GROWTH
Culture: NO GROWTH

## 2016-06-25 NOTE — Discharge Summary (Signed)
[facility] - [place] at [facility]  Alfonse Rasudrey E Watchman, [place]49 y.o., DOB 02/09/1966, MRN [id number]. Admission date: 06/21/2016 Discharge Date 06/25/2016 Primary MD Domenic SchwabLindley,Cheryl Paulette, FNP Admitting Physician Marguarite ArbourJeffrey D Sparks, MD  AGAINST MEDICAL ADVICE summary   Admission Diagnosis  Cellulitis of abdominal wall [L03.311] Acute cystitis without hematuria [N30.00] Sepsis, due to unspecified organism [facility](HCC) [A41.9]  Discharge Diagnosis   Principal Problem:   Sepsis (HCC) Active Problems:   Bipolar 1 disorder, mixed, partial remission (HCC)   Abdominal pain   Cellulitis of abdominal wall   UTI (urinary tract infection)   Acute cystitis without hematuria         Hospital Course patient is a 50 year old was admitted with a urinary tract infection abdominal wall infection. She was kept in the hospital for therapy for abdominal cellulitis with IV antibiotics. She was improving.  Today patient started wanting to go home. Before I can even come evaluate her and possibly discharged home she left her room and left AGAINST MEDICAL ADVICE. She had done the same thing during her recent previous admission.     Total Time in preparing paper work, data evaluation and todays exam - 35 minutes  Auburn BilberryPATEL, Amillia Biffle M.D on 06/25/2016 at 4:06 [facility]  [facility]   Office  [phone]

## 2016-06-26 LAB — CULTURE, BLOOD (ROUTINE X 2)
CULTURE: NO GROWTH
CULTURE: NO GROWTH

## 2016-07-03 ENCOUNTER — Other Ambulatory Visit: Payer: Self-pay | Admitting: Neurosurgery

## 2016-07-08 ENCOUNTER — Telehealth: Payer: Self-pay

## 2016-07-08 NOTE — Telephone Encounter (Signed)
Patient called in [facility] a [facility] to aide in processing Ostomy supply order. [phone].   This number is actually to [facility]. I spoke with Sri LankaAntonia. Verbal was given for patient's ostomy supplies. Faxed [phone];  Op note and consult note from Maple HudsonKaren Sanders at this time in order to aide in getting patient the proper supplies. Their is some confusion on what type of ostomy that patient has between the ostomy supply company and patient. I hope this documentation will help straighten this confusion out.  Awaiting written order for signature at this time.

## 2016-07-09 ENCOUNTER — Ambulatory Visit: Payer: Medicare Other

## 2016-07-11 ENCOUNTER — Telehealth: Payer: Self-pay

## 2016-07-11 NOTE — Telephone Encounter (Signed)
Called Dr. Wilford Corneraff's office and had to leave a voicemail message on the nurses box to return my call. Awaiting a call from Dr. Wilford Corneraff's office.

## 2016-07-11 NOTE — Telephone Encounter (Signed)
Called Sabrina Espinoza back to help her with her question. Sabrina Espinoza stated that the reason she was calling us is because she did not know what else to do. She stated that on 07/04/2016 she had a CT Scan Abdomen done ordered [facility] but had not heard from them. She wanted to know if we had the results back. I told her that since it was ordered by [facility], that she would have to call them and ask them about the results. She stated that she had called and that no one had called her back. I then told Sabrina Espinoza that I would call Dr. Wilford Corneraff's office to let them know that she was waiting for their call with results and what would be her next step. Sabrina Espinoza understood and had no further questions.

## 2016-07-11 NOTE — Telephone Encounter (Signed)
Patient is stating [facility] and can't get a hold of them to find out her results. I told her if they ordered it, they have to be the one to review it with her. She then told me that they told her to follow up with her physician but the patient doesn't have one. She had a laparoscopic appendectomy performed by Dr. Everlene FarrierPabon on 04/17/16. I'm not quite understanding everything that the patient is saying but she needs to speak with a nurse. Please call patient and advice.

## 2016-07-11 NOTE — Telephone Encounter (Signed)
Sabrina Espinoza, Sabrina Desiree Allen, MD  [address]  [place] [id number], [address]  [place], [place] [id number]  [phone]  [phone] (Fax)

## 2016-07-14 NOTE — Telephone Encounter (Signed)
Called Dr. Wilford Corneraff's office and spoke with Bjorn Loserhonda [facility](Front Desk personnel). I told her that I have left two messages to the nurse last week and yet patient has not received a call from the nurse. Bjorn LoserRhonda stated that she would let the nurse know to call patient so she could give her CT Scan results and what would happen next.   In case patient calls, please give her Dr. Wilford Corneraff's phone number: [phone].

## 2016-07-15 ENCOUNTER — Telehealth: Payer: Self-pay

## 2016-07-15 NOTE — Telephone Encounter (Signed)
Called patient back and she stated that she went to see her primary care doctor yesterday because she noticed "two knots" on her incision site. Patient stated that her primary care doctor prescribed antibiotics but doesn't recall the name of it. She wanted to know if there is any way that she could be seen by us today. I told her that since [facility], that she needed to follow up with them. Patient started to cry and stated that she wanted to come in to our office because [facility] has not answered her calls. I told her that I would call [facility] and leave them a message to call her back. Patient agreed at this time.  I then called [facility] (Dr. Bess Harvestaff) and told them what was going on with the patient and if they could please call the patient and see if they could see her soon in reference to her concerns. I was told that she would send a message directly to Dr. Bess Harvestaff so she could call the patient.  I then called patient but had to leave her a detailed message letting her know that she would receive a call from Dr. Bess Harvestaff in reference to her questions and concerns. I also stated that if she had further questions or needed additional help, to please call me back.

## 2016-07-15 NOTE — Telephone Encounter (Signed)
Patient has 2 knots on the right side of her incision. She went to see her family doctor about the knots. The doctor told her that there is a pin hole where she picked the scab. There is leakage coming from the site. Her doctor told her to see her surgeon immediately. The doctor also put her on antibiotics. Please call patient and advice.

## 2016-07-18 ENCOUNTER — Inpatient Hospital Stay (HOSPITAL_COMMUNITY)
Admission: RE | Admit: 2016-07-18 | Discharge: 2016-07-18 | Disposition: A | Discharge status: Home / Self Care | Payer: Medicare Other | Source: Ambulatory Visit

## 2016-07-25 ENCOUNTER — Telehealth: Payer: Self-pay

## 2016-07-25 ENCOUNTER — Encounter (HOSPITAL_COMMUNITY): Admission: RE | Payer: Self-pay | Source: Ambulatory Visit

## 2016-07-25 ENCOUNTER — Ambulatory Visit (HOSPITAL_COMMUNITY): Admission: RE | Admit: 2016-07-25 | Payer: Medicare Other | Source: Ambulatory Visit | Admitting: Neurosurgery

## 2016-07-25 SURGERY — POSTERIOR CERVICAL LAMINECTOMY
Anesthesia: General | Laterality: Right

## 2016-07-25 NOTE — Telephone Encounter (Signed)
Patient is calling because she is having extreme abdominal pain she says near her hernia. She has an appointment on the 30 th but wants to know if there is anything that she can take for this pain or if she needs to come in sooner. Patient states that the pain spreads to her back. She has been using heating pads but they aren't working. Patient denies fevers, she goes from hot to cold a lot, no diarrhea, patient denies vomiting but states that she has nausea. Please call patient and advice

## 2016-07-25 NOTE — Telephone Encounter (Signed)
Called patient back in reference to her question. I asked patient for how long she has been having the abdominal pain and she stated that it has been for a week. Patient stated that she has been limited to movement, not able to sleep at night and back pain. I asked patient what she had done in reference to her pain. She stated that she's been taking Ibuprofen, Tylenol, BC Powder and nothing has eased her pain. I told patient that I would have to speak with Dr.Pabon and ask what he would want her to do. Patient understood. I called patient back to let her know that I had spoken to Dr. Everlene FarrierPabon and that he would not write her a prescription for her pain. However, he recommended for her to go to the emergency room in case she wants something for her pain. Patient stated that she would call a friend to take her to the emergency room. Patient had no further questions.

## 2016-08-04 ENCOUNTER — Ambulatory Visit
Admission: RE | Admit: 2016-08-04 | Discharge: 2016-08-04 | Disposition: A | Discharge status: Home / Self Care | Payer: Medicare Other | Source: Ambulatory Visit | Attending: Family Medicine | Admitting: Family Medicine

## 2016-08-04 DIAGNOSIS — Z1231 Encounter for screening mammogram for malignant neoplasm of breast: Secondary | ICD-10-CM | POA: Insufficient documentation

## 2016-08-05 ENCOUNTER — Ambulatory Visit: Payer: Medicare Other | Admitting: Surgery

## 2016-08-10 ENCOUNTER — Other Ambulatory Visit: Payer: Self-pay | Admitting: Family Medicine

## 2016-08-10 DIAGNOSIS — R0982 Postnasal drip: Principal | ICD-10-CM

## 2016-08-10 DIAGNOSIS — J309 Allergic rhinitis, unspecified: Secondary | ICD-10-CM

## 2016-08-18 ENCOUNTER — Ambulatory Visit: Payer: Medicare Other | Admitting: General Surgery

## 2016-08-25 ENCOUNTER — Other Ambulatory Visit: Payer: Self-pay

## 2016-08-28 ENCOUNTER — Telehealth: Payer: Self-pay

## 2016-08-28 ENCOUNTER — Ambulatory Visit (INDEPENDENT_AMBULATORY_CARE_PROVIDER_SITE_OTHER): Payer: Medicare Other | Admitting: Surgery

## 2016-08-28 ENCOUNTER — Encounter: Payer: Self-pay | Admitting: Surgery

## 2016-08-28 VITALS — BP 124/94 | HR 99 | Temp 98.4°F | Ht 69.0 in | Wt 161.8 lb

## 2016-08-28 DIAGNOSIS — R1084 Generalized abdominal pain: Secondary | ICD-10-CM

## 2016-08-28 NOTE — Telephone Encounter (Signed)
Patient wants to know if she can keep her colostomy bag and not have it removed. Please call patient and advice.

## 2016-08-28 NOTE — Progress Notes (Signed)
Surgical Consultation  08/28/2016  Sabrina Espinoza is an 51 y.o. female.   Chief Complaint  Patient presents with  . Other    Laparotomy, Colostomy, Creation, Hartman's Procedure, and Appendicitis     HPI: Mr. Cristal Deer is a 51 year old female well known to our service with multiple comorbidities including COPD, active smoker, polysubstance abuse, anxiety and bipolar disorder. I performed a Hartman's procedure. About 4 months ago for perforated diverticulitis. She has developed a parastomal hernia. Now comes in for further stabilization and possibility of colostomy takedown and parastomal hernia repair. She is able to tolerate regular food. Her ostomy is working and she ended every other day or so. She states that she is off the narcotics but continues to smoke about 1 pack of cigarettes a day.  Past Medical History:  Diagnosis Date  . Anxiety   . Chronic constipation    on Linzess, Miralax, Zofran  . Chronic insomnia 11/02/2015  . COPD, moderate (HCC)   . Depression   . Depression with anxiety    Followed by Dr. Lynett Fish, psychiatrist at [facility] and Wilma Flavin for thrapy  . History of chicken pox   . History of cocaine use   . History of pneumonia   . Hypothyroidism, adult    Working with Dr. Horton Chin, Endocrinology. S/P RAIU and is planning on biopsy  . Migraine without status migrainosus, not intractable   . Nicotine dependence, uncomplicated   . Nontoxic multinodular goiter    FNA done 10/30/14 with benign results. Seen [name]. Patient may want a second opinion.  . Rhinitis, allergic   . Vitamin D deficiency     Past Surgical History:  Procedure Laterality Date  . ANKLE FRACTURE SURGERY  1988   rods and pins in place  . APPENDECTOMY  04/17/2016   Procedure: APPENDECTOMY;  Surgeon: Leafy Ro, MD;  Location: [facility] ORS;  Service: General;;  . CESAREAN SECTION  1996  . COLECTOMY WITH COLOSTOMY CREATION/HARTMANN PROCEDURE N/A 04/17/2016    Procedure: COLOSTOMY CREATION/HARTMANN PROCEDURE;  Surgeon: Leafy Ro, MD;  Location: [facility] ORS;  Service: General;  Laterality: N/A;  . ECTOPIC PREGNANCY SURGERY  2003  . LAPAROTOMY N/A 04/17/2016   Procedure: EXPLORATORY LAPAROTOMY;  Surgeon: Leafy Ro, MD;  Location: [facility] ORS;  Service: General;  Laterality: N/A;  . NECK SURGERY    . SHOULDER SURGERY      Family History  Problem Relation Age of Onset  . Heart disease Father   . Alcohol abuse Father   . Diabetes Father   . Heart disease Brother   . Depression Brother   . Stroke Brother   . Alcohol abuse Paternal Grandfather   . Osteoporosis Mother   . Breast cancer Neg Hx     Social History:  reports that she has been smoking Cigarettes.  She has a 24.00 pack-year smoking history. She has never used smokeless tobacco. She reports that she does not drink alcohol or use drugs.  Allergies: No Known Allergies  Medications reviewed.     ROS Full ROS performed and is otherwise negative    BP (!) 124/94   Pulse 99   Temp 98.4 F (36.9 C) (Oral)   Ht 5\' 9"  (1.753 m)   Wt 73.4 kg (161 lb 12.8 oz)   BMI 23.89 kg/m   Physical Exam  Constitutional: She is oriented to person, place, and time and well-developed, well-nourished, and in no distress. No distress.  Eyes: Conjunctivae and EOM are  normal.  Pinpoint pupils  Neck: Normal range of motion. Neck supple.  Pulmonary/Chest: Effort normal. No respiratory distress. She exhibits no tenderness.  Abdominal: Soft. Bowel sounds are normal. She exhibits no distension. There is no tenderness. There is no rebound.  Midline laparotomy scar without evidence of infection. There is a large parastomal hernia but this hernia is reducible. The ostomy is pink and patent.  Musculoskeletal: Normal range of motion. She exhibits no edema.  Neurological: She is alert and oriented to person, place, and time.  Skin: Skin is warm and dry.  Psychiatric: Mood, memory, affect and judgment  normal.  Nursing note and vitals reviewed.     No results found for this or any previous visit (from the past 48 hour(s)). No results found.  Assessment/Plan: 51 year old with multiple  comorbidities including active smoker, malnutrition, history of substance abuse and bipolar disorder. Now with parastomal hernia and wishes to have the colostomy reversal. Discussed with the patient in detail about potential complications for colostomy takedown and repair of parastomal hernia. I stated that given all her comorbidities there is a significant chance for potential complications. We will obtain some baseline labs including albumin some LFTs and coags. I will also obtain a oxycodone G screen to confirm that she is not abusing narcotics or any other potential drugs. I stated to her that I would only attempt to do a colostomy takedown and parastomal hernia repair if she quit smoking and if we can get a note from the psychiatry stating that she is in her S possible state of mind and before proceeding for major surgery. I have also stated that her nutrition was taken is to be in optimal condition and she needs to be obviously be clean for any illegal substances  on her system. No need for emergent surgical revision at this time. A reassess in 3 weeks and if she is compliant may continue further workup in the form of colonoscopy extensive counseling provided to the patient.    Diego pabon, MD FACS General Surgeon dermatitis

## 2016-08-28 NOTE — Patient Instructions (Signed)
We will send you to the medical mall today for some additional labs and urine test.  Directions to Medical Mall: When leaving our office, go right. Go all of the way down to the very end of the hallway. You will have a purple wall in front of you. You will now have a tunnel to the hospital on your left hand side. Go through this tunnel and the elevators will be on your left. Go down to the 1st floor and take a slight left. The very first desk on the right hand side is the registration desk.  The conditions to talk about reversal are: 1. STOP Smoking 2. Be clean from all drugs 3. Clearance from your Psychiatrist  We will follow-up with you in 3 weeks to see how you are doing with all of these things. See appointment below.  Please call with any questions or concerns prior to your next visit.

## 2016-08-28 NOTE — Telephone Encounter (Signed)
I spoke with the patient at this time and told her it is absolutely okay to leave her colostomy bag on. I canceled the patients upcoming appointment that was scheduled to discuss the colostomy takedown and advised her to call us back if anything changes.

## 2016-08-29 ENCOUNTER — Telehealth: Payer: Self-pay

## 2016-08-29 ENCOUNTER — Other Ambulatory Visit: Payer: Self-pay

## 2016-08-29 DIAGNOSIS — Z01818 Encounter for other preprocedural examination: Secondary | ICD-10-CM

## 2016-08-29 DIAGNOSIS — K7460 Unspecified cirrhosis of liver: Secondary | ICD-10-CM

## 2016-08-29 NOTE — Telephone Encounter (Signed)
Sabrina Espinoza is calling because she is constipated. I told her to take Miralax 17g x 2 doses 6 hours apart, followed by Dulcolax 10 mg x2 doses 6 hours apart, and then 2 fleet's enemas back to back. I also advised the patient to drink water with a goal of 72 oz daily and to decrease her narcotics. Patient understood

## 2016-08-29 NOTE — Telephone Encounter (Signed)
Sabrina Espinoza has changed her mind. She wants to proceed with the colostomy takedown. She had questions about smoking. She wanted to know if she has to stop smoking only cigarettes or if she has to stop vaping as well. I spoke with Dr. Everlene FarrierPabon, she is to stop everything and we are going to need her clean before her next appointment. She is still going to need a psychiatric evaluation as well. I have extended her original appointment from march 7 to April 12 th. Sabrina Espinoza states that she will stop everything in the beginning of March.

## 2016-08-29 NOTE — Telephone Encounter (Signed)
Documentation below regarding authorization is incorrect  Please disregard.

## 2016-08-29 NOTE — Telephone Encounter (Signed)
No authorization required for CPT code [id number] per Paul Halfaron F.

## 2016-09-01 ENCOUNTER — Telehealth: Payer: Self-pay

## 2016-09-01 NOTE — Telephone Encounter (Signed)
Call made to patient at this time to discuss lab work that has not been done since her last appointment. Patient states that she will have this done 3/5 or 3/6. I explained that she has to have her labs and urine done prior to scheduled 09/10/16 appointment in order to discuss next step towards colostomy reversal. Patient verbalizes understanding.

## 2016-09-08 ENCOUNTER — Telehealth: Payer: Self-pay

## 2016-09-08 NOTE — Telephone Encounter (Signed)
Call made to Dr. [name] (Pyschiatrist) Office [phone]. Spoke with [place]. She asked that I send over the clearance form to [phone] or [phone] and she will get this to Physician so that he can fill this out prior to scheduling surgery.  This was faxed at this time with positive confirmation.  Awaiting reply.

## 2016-09-10 ENCOUNTER — Ambulatory Visit: Payer: Medicare Other | Admitting: Surgery

## 2016-09-10 ENCOUNTER — Ambulatory Visit: Payer: Self-pay | Admitting: Surgery

## 2016-09-16 ENCOUNTER — Ambulatory Visit: Payer: Self-pay | Admitting: Urology

## 2016-09-18 ENCOUNTER — Ambulatory Visit: Payer: Self-pay | Admitting: Urology

## 2016-09-18 ENCOUNTER — Telehealth: Payer: Self-pay

## 2016-09-18 NOTE — Telephone Encounter (Signed)
Medical Clearance obtained from DR.Hansen Su and will be scanned under Media.  Medical Clearance faxed to cheryl Nuala AlphaPaulette Lindley at this time.

## 2016-09-18 NOTE — Telephone Encounter (Signed)
Sherry from Dr. Lacie ScottsNiemeyer called stating that she called the patient to schedule her an appointment for them to clear her for surgery. Cordelia PenSherry stated that the patient stated that she had told us that she was not to proceed on doing surgery. I thank Cordelia PenSherry for the information and asked if there was any way that she could fax that information in writing. She stated that she would.  I will inform Velna HatchetSheila about this.

## 2016-09-25 ENCOUNTER — Ambulatory Visit: Payer: Self-pay | Admitting: Urology

## 2016-09-29 ENCOUNTER — Ambulatory Visit: Payer: Self-pay | Admitting: Urology

## 2016-10-13 ENCOUNTER — Ambulatory Visit: Payer: Self-pay | Admitting: Surgery

## 2016-10-16 ENCOUNTER — Ambulatory Visit: Payer: Self-pay | Admitting: Surgery

## 2016-10-31 ENCOUNTER — Telehealth: Payer: Self-pay

## 2016-10-31 NOTE — Telephone Encounter (Signed)
Spoke with patient at this time. She does not wish to come to appointment on 11/05/16 as she does not want to see Dr Everlene Farrier any longer for surgical care. She is asking for a referral to be seen at [facility]. I cancelled appointment per patient request. Referral will be placed.

## 2016-10-31 NOTE — Telephone Encounter (Signed)
I have put in an internal referral to [facility]    I will follow up within 3-5 days to make sure the appointments have been scheduled.

## 2016-11-05 ENCOUNTER — Ambulatory Visit: Payer: Medicare Other | Admitting: Surgery

## 2016-11-12 ENCOUNTER — Telehealth: Payer: Self-pay

## 2016-11-12 NOTE — Telephone Encounter (Signed)
Patient called upset because Sabrina Espinoza called her to schedule her an appointment. She is saying that she does not want to go to [facility], she just wants to know what happened to her during and after surgery. Her mother doesn't remember much and the other person she had with her is no longer in contact. She also wants to know if she has a hernia or not. She is upset because Sabrina Espinoza told her she had to get her lab work completed before she can make an appointment with Sabrina Espinoza.   We were under the understanding that she wanted a colostomy reversal. An appointment was made for her to discuss the reversal once again with Sabrina Espinoza. This is why lab work was ordered and required. At the point in time, the patient wanted to continue her care with a different provider. This is why we referred her to  Espinoza.   I am now at this time understanding that she does not want a colostomy reversal. I spoke with Sabrina Espinoza and laboratory work is still needing to be done in the case that she will want surgery if she has a hernia as well as a drug screening if she wants a further appointment. I explained this to the patient but she still does not understand why it is needed. We came to the understanding that she will get her labs completed and call us to set her an appointment with Sabrina Espinoza to have her questions answered by him. She asked me if she needed an appointment to get her labs drawn. I asked to her to give me one moment to go and ask the nurse. I placed her on hold, confirmed with Sabrina Espinoza that she does not need an appointment and that her orders are still in the system.  When I came back to relay the message, the patient had hung up. I will wait until she contacts me to relay that information to her.

## 2016-11-17 ENCOUNTER — Other Ambulatory Visit: Payer: Self-pay | Admitting: Specialist

## 2016-11-18 DIAGNOSIS — G5600 Carpal tunnel syndrome, unspecified upper limb: Secondary | ICD-10-CM | POA: Insufficient documentation

## 2016-11-19 ENCOUNTER — Encounter
Admission: RE | Admit: 2016-11-19 | Discharge: 2016-11-19 | Disposition: A | Discharge status: Home / Self Care | Payer: Medicare Other | Source: Ambulatory Visit | Attending: Specialist | Admitting: Specialist

## 2016-11-19 DIAGNOSIS — G5601 Carpal tunnel syndrome, right upper limb: Secondary | ICD-10-CM | POA: Diagnosis not present

## 2016-11-19 DIAGNOSIS — Z01812 Encounter for preprocedural laboratory examination: Secondary | ICD-10-CM | POA: Diagnosis not present

## 2016-11-19 HISTORY — DX: Bipolar disorder, unspecified: F31.9

## 2016-11-19 HISTORY — DX: Gastro-esophageal reflux disease without esophagitis: K21.9

## 2016-11-19 HISTORY — DX: Inflammatory liver disease, unspecified: K75.9

## 2016-11-19 HISTORY — DX: Personal history of other diseases of the digestive system: Z87.19

## 2016-11-19 HISTORY — DX: Unspecified osteoarthritis, unspecified site: M19.90

## 2016-11-19 LAB — SURGICAL PCR SCREEN
MRSA, PCR: NEGATIVE
STAPHYLOCOCCUS AUREUS: NEGATIVE

## 2016-11-19 LAB — CBC
HCT: 33.9 % — ABNORMAL LOW (ref 35.0–47.0)
HEMOGLOBIN: 11.6 g/dL — AB (ref 12.0–16.0)
MCH: 29.8 pg (ref 26.0–34.0)
MCHC: 34.3 g/dL (ref 32.0–36.0)
MCV: 87 fL (ref 80.0–100.0)
PLATELETS: 335 10*3/uL (ref 150–440)
RBC: 3.89 MIL/uL (ref 3.80–5.20)
RDW: 14.1 % (ref 11.5–14.5)
WBC: 10.3 10*3/uL (ref 3.6–11.0)

## 2016-11-19 NOTE — Patient Instructions (Signed)
  Your procedure is scheduled [id number]:Monday Nov 24, 2016 , 2018. Report to Same Day Surgery at 3:00pm .   Remember: Instructions that are not followed completely may result in serious medical risk, up to and including death, or upon the discretion of your surgeon and anesthesiologist your surgery may need to be rescheduled.    _x___ 1. Do not eat food or drink liquids after midnight. No gum chewing or hard candies.     ____ 2. No Alcohol for 24 hours before or after surgery.   ____ 3. Bring all medications with you on the day of surgery if instructed.    __x__ 4. Notify your doctor if there is any change in your medical condition     (cold, fever, infections).    __x___ 5. No smoking 24 hours prior to surgery.     Do not wear jewelry, make-up, hairpins, clips or nail polish.  Do not wear lotions, powders, or perfumes.   Do not shave 48 hours prior to surgery. Men may shave face and neck.  Do not bring valuables to the hospital.    [facility] is not responsible for any belongings or valuables.               Contacts, dentures or bridgework may not be worn into surgery.  Leave your suitcase in the car. After surgery it may be brought to your room.  For patients admitted to the hospital, discharge time is determined by your treatment team.   Patients discharged the day of surgery will not be allowed to drive home.    Please read over the following fact sheets that you were given:   [facility] Preparing for [facility]  _x_ Take these medicines the morning of surgery with A SIP OF WATER:    1. Brexpiprazole (REXULTI)  2. clonazePAM (KLONOPIN)  3. diltiazem (CARDIZEM CD)   4. lamoTRIgine (LAMICTAL)   5. montelukast (SINGULAIR)  6.ranitidine (ZANTAC)  7. rOPINIRole (REQUIP)  ____ Fleet Enema (as directed)   _x___ Use CHG Soap as directed on instruction sheet  _x___ Use inhalers on the day of surgery and bring to hospital day of surgery  ____ Stop metformin 2 days prior to  surgery    ____ Take 1/2 of usual insulin dose the night before surgery and none on the morning of surgery.   ____ Stop Coumadin/Plavix/aspirin on does not apply.  _x_ Stop Anti-inflammatories such as Advil, Aleve, Ibuprofen, Motrin, Naproxen, meloxicam (MOBIC), butalbital-acetaminophen-caffeine (FIORICET,  ESGIC) Naprosyn, Goodies powders or aspirin products.  OK to take Tylenol.   __x__ Stop supplements BIOTIN until after surgery.    ____ Bring C-Pap to the hospital.

## 2016-11-23 MED ORDER — CEFAZOLIN SODIUM-DEXTROSE 2-4 GM/100ML-% IV SOLN
2.0000 g | INTRAVENOUS | Status: AC
Start: 2016-11-24 — End: 2016-11-24
  Administered 2016-11-24: 2 g via INTRAVENOUS

## 2016-11-23 MED ORDER — CLINDAMYCIN PHOSPHATE 600 MG/50ML IV SOLN
600.0000 mg | Freq: Once | INTRAVENOUS | Status: AC
  Administered 2016-11-24: 600 mg via INTRAVENOUS

## 2016-11-24 ENCOUNTER — Encounter: Payer: Self-pay | Admitting: *Deleted

## 2016-11-24 ENCOUNTER — Ambulatory Visit: Payer: Medicaid - Out of State | Admitting: Certified Registered"

## 2016-11-24 ENCOUNTER — Encounter
Admission: RE | Disposition: A | Discharge status: Home / Self Care | Payer: Self-pay | Source: Ambulatory Visit | Attending: Specialist

## 2016-11-24 ENCOUNTER — Ambulatory Visit
Admission: RE | Admit: 2016-11-24 | Discharge: 2016-11-24 | Disposition: A | Discharge status: Home / Self Care | Payer: Medicaid - Out of State | Source: Ambulatory Visit | Attending: Specialist | Admitting: Specialist

## 2016-11-24 DIAGNOSIS — F319 Bipolar disorder, unspecified: Secondary | ICD-10-CM | POA: Insufficient documentation

## 2016-11-24 DIAGNOSIS — M1990 Unspecified osteoarthritis, unspecified site: Secondary | ICD-10-CM | POA: Diagnosis not present

## 2016-11-24 DIAGNOSIS — E039 Hypothyroidism, unspecified: Secondary | ICD-10-CM | POA: Diagnosis not present

## 2016-11-24 DIAGNOSIS — G629 Polyneuropathy, unspecified: Secondary | ICD-10-CM | POA: Diagnosis not present

## 2016-11-24 DIAGNOSIS — G5601 Carpal tunnel syndrome, right upper limb: Secondary | ICD-10-CM | POA: Diagnosis present

## 2016-11-24 DIAGNOSIS — F5104 Psychophysiologic insomnia: Secondary | ICD-10-CM | POA: Diagnosis not present

## 2016-11-24 DIAGNOSIS — K5909 Other constipation: Secondary | ICD-10-CM | POA: Diagnosis not present

## 2016-11-24 DIAGNOSIS — J449 Chronic obstructive pulmonary disease, unspecified: Secondary | ICD-10-CM | POA: Insufficient documentation

## 2016-11-24 DIAGNOSIS — K219 Gastro-esophageal reflux disease without esophagitis: Secondary | ICD-10-CM | POA: Diagnosis not present

## 2016-11-24 DIAGNOSIS — F418 Other specified anxiety disorders: Secondary | ICD-10-CM | POA: Diagnosis not present

## 2016-11-24 HISTORY — PX: CARPAL TUNNEL RELEASE: SHX101

## 2016-11-24 LAB — URINE DRUG SCREEN, QUALITATIVE (ARMC ONLY)
Amphetamines, Ur Screen: NOT DETECTED
BARBITURATES, UR SCREEN: POSITIVE — AB
BENZODIAZEPINE, UR SCRN: NOT DETECTED
COCAINE METABOLITE, UR ~~LOC~~: NOT DETECTED
Cannabinoid 50 Ng, Ur ~~LOC~~: NOT DETECTED
MDMA (Ecstasy)Ur Screen: NOT DETECTED
METHADONE SCREEN, URINE: NOT DETECTED
OPIATE, UR SCREEN: POSITIVE — AB
PHENCYCLIDINE (PCP) UR S: NOT DETECTED
Tricyclic, Ur Screen: POSITIVE — AB

## 2016-11-24 SURGERY — CARPAL TUNNEL RELEASE
Anesthesia: General | Site: Arm Lower | Laterality: Right | Wound class: Clean

## 2016-11-24 MED ORDER — ONDANSETRON HCL 4 MG/2ML IJ SOLN
INTRAMUSCULAR | Status: DC | PRN
  Administered 2016-11-24: 4 mg via INTRAVENOUS

## 2016-11-24 MED ORDER — PROMETHAZINE HCL 25 MG/ML IJ SOLN: 6.2500 mg | INTRAMUSCULAR | Status: DC | PRN

## 2016-11-24 MED ORDER — CHLORHEXIDINE GLUCONATE CLOTH 2 % EX PADS: 6.0000 | MEDICATED_PAD | Freq: Once | CUTANEOUS | Status: DC

## 2016-11-24 MED ORDER — PROPOFOL 10 MG/ML IV BOLUS
INTRAVENOUS | Status: AC
  Filled 2016-11-24: qty 20

## 2016-11-24 MED ORDER — HYDROCODONE-ACETAMINOPHEN 5-325 MG PO TABS: 1.0000 | ORAL_TABLET | Freq: Four times a day (QID) | ORAL | 0 refills | Status: DC | PRN

## 2016-11-24 MED ORDER — DEXMEDETOMIDINE HCL IN NACL 200 MCG/50ML IV SOLN
INTRAVENOUS | Status: AC
  Filled 2016-11-24: qty 50

## 2016-11-24 MED ORDER — PROPOFOL 10 MG/ML IV BOLUS
INTRAVENOUS | Status: DC | PRN
  Administered 2016-11-24: 200 mg via INTRAVENOUS

## 2016-11-24 MED ORDER — ONDANSETRON HCL 4 MG/2ML IJ SOLN
INTRAMUSCULAR | Status: AC
  Filled 2016-11-24: qty 2

## 2016-11-24 MED ORDER — FENTANYL CITRATE (PF) 100 MCG/2ML IJ SOLN
INTRAMUSCULAR | Status: AC
  Administered 2016-11-24: 50 ug via INTRAVENOUS
  Filled 2016-11-24: qty 2

## 2016-11-24 MED ORDER — LIDOCAINE HCL (PF) 2 % IJ SOLN
INTRAMUSCULAR | Status: AC
  Filled 2016-11-24: qty 2

## 2016-11-24 MED ORDER — LIDOCAINE HCL (CARDIAC) 20 MG/ML IV SOLN
INTRAVENOUS | Status: DC | PRN
  Administered 2016-11-24: 60 mg via INTRAVENOUS

## 2016-11-24 MED ORDER — MELOXICAM 7.5 MG PO TABS
ORAL_TABLET | ORAL | Status: AC
  Administered 2016-11-24: 15 mg via ORAL
  Filled 2016-11-24: qty 2

## 2016-11-24 MED ORDER — MIDAZOLAM HCL 2 MG/2ML IJ SOLN
INTRAMUSCULAR | Status: DC | PRN
  Administered 2016-11-24: 2 mg via INTRAVENOUS

## 2016-11-24 MED ORDER — MIDAZOLAM HCL 2 MG/2ML IJ SOLN
INTRAMUSCULAR | Status: AC
  Filled 2016-11-24: qty 2

## 2016-11-24 MED ORDER — LACTATED RINGERS IV SOLN
INTRAVENOUS | Status: DC
  Administered 2016-11-24 (×2): via INTRAVENOUS

## 2016-11-24 MED ORDER — CLINDAMYCIN PHOSPHATE 600 MG/50ML IV SOLN
INTRAVENOUS | Status: AC
  Filled 2016-11-24: qty 50

## 2016-11-24 MED ORDER — GABAPENTIN 400 MG PO CAPS
ORAL_CAPSULE | ORAL | Status: AC
  Administered 2016-11-24: 400 mg via ORAL
  Filled 2016-11-24: qty 1

## 2016-11-24 MED ORDER — MELOXICAM 15 MG PO TABS: 15.0000 mg | ORAL_TABLET | Freq: Every day | ORAL | 3 refills | Status: AC

## 2016-11-24 MED ORDER — CEFAZOLIN SODIUM-DEXTROSE 2-4 GM/100ML-% IV SOLN
INTRAVENOUS | Status: AC
  Filled 2016-11-24: qty 100

## 2016-11-24 MED ORDER — BUPIVACAINE HCL 0.5 % IJ SOLN
INTRAMUSCULAR | Status: DC | PRN
  Administered 2016-11-24: 16 mL

## 2016-11-24 MED ORDER — GABAPENTIN 400 MG PO CAPS
400.0000 mg | ORAL_CAPSULE | ORAL | Status: AC
  Administered 2016-11-24: 400 mg via ORAL

## 2016-11-24 MED ORDER — MELOXICAM 7.5 MG PO TABS
15.0000 mg | ORAL_TABLET | Freq: Once | ORAL | Status: AC
  Administered 2016-11-24: 15 mg via ORAL

## 2016-11-24 MED ORDER — DEXAMETHASONE SODIUM PHOSPHATE 10 MG/ML IJ SOLN
INTRAMUSCULAR | Status: AC
  Filled 2016-11-24: qty 1

## 2016-11-24 MED ORDER — FENTANYL CITRATE (PF) 100 MCG/2ML IJ SOLN
INTRAMUSCULAR | Status: DC | PRN
  Administered 2016-11-24 (×2): 50 ug via INTRAVENOUS

## 2016-11-24 MED ORDER — BUPIVACAINE HCL (PF) 0.5 % IJ SOLN
INTRAMUSCULAR | Status: AC
  Filled 2016-11-24: qty 30

## 2016-11-24 MED ORDER — DEXAMETHASONE SODIUM PHOSPHATE 10 MG/ML IJ SOLN
INTRAMUSCULAR | Status: DC | PRN
  Administered 2016-11-24: 10 mg via INTRAVENOUS

## 2016-11-24 MED ORDER — FENTANYL CITRATE (PF) 100 MCG/2ML IJ SOLN
25.0000 ug | INTRAMUSCULAR | Status: DC | PRN
  Administered 2016-11-24: 50 ug via INTRAVENOUS
  Administered 2016-11-24: 25 ug via INTRAVENOUS

## 2016-11-24 MED ORDER — FENTANYL CITRATE (PF) 100 MCG/2ML IJ SOLN
INTRAMUSCULAR | Status: AC
Start: 2016-11-24 — End: ?
  Filled 2016-11-24: qty 2

## 2016-11-24 SURGICAL SUPPLY — 24 items
BLADE SURG MINI STRL (BLADE) ×3 IMPLANT
BNDG ESMARK 4X12 TAN STRL LF (GAUZE/BANDAGES/DRESSINGS) ×3 IMPLANT
CANISTER SUCT 1200ML W/VALVE (MISCELLANEOUS) ×3 IMPLANT
CHLORAPREP W/TINT 26ML (MISCELLANEOUS) ×3 IMPLANT
CUFF TOURN 18 STER (MISCELLANEOUS) IMPLANT
ELECT REM PT RETURN 9FT ADLT (ELECTROSURGICAL) ×3
ELECTRODE REM PT RTRN 9FT ADLT (ELECTROSURGICAL) ×1 IMPLANT
GAUZE FLUFF 18X24 1PLY STRL (GAUZE/BANDAGES/DRESSINGS) ×3 IMPLANT
GAUZE PETRO XEROFOAM 1X8 (MISCELLANEOUS) ×3 IMPLANT
GLOVE BIO SURGEON STRL SZ8 (GLOVE) ×3 IMPLANT
GOWN STRL REUS W/ TWL LRG LVL3 (GOWN DISPOSABLE) ×1 IMPLANT
GOWN STRL REUS W/TWL LRG LVL3 (GOWN DISPOSABLE) ×2
GOWN STRL REUS W/TWL LRG LVL4 (GOWN DISPOSABLE) ×3 IMPLANT
KIT RM TURNOVER STRD PROC AR (KITS) ×3 IMPLANT
NS IRRIG 500ML POUR BTL (IV SOLUTION) ×3 IMPLANT
PACK EXTREMITY ARMC (MISCELLANEOUS) ×3 IMPLANT
PAD PREP 24X41 OB/GYN DISP (PERSONAL CARE ITEMS) ×3 IMPLANT
SPLINT CAST 1 STEP 3X12 (MISCELLANEOUS) ×3 IMPLANT
STOCKINETTE BIAS CUT 4 980044 (GAUZE/BANDAGES/DRESSINGS) ×3 IMPLANT
STOCKINETTE STRL 4IN 9604848 (GAUZE/BANDAGES/DRESSINGS) ×3 IMPLANT
SUT ETHILON 4-0 (SUTURE) ×2
SUT ETHILON 4-0 FS2 18XMFL BLK (SUTURE) ×1
SUT ETHILON 5-0 FS-2 18 BLK (SUTURE) ×3 IMPLANT
SUTURE ETHLN 4-0 FS2 18XMF BLK (SUTURE) ×1 IMPLANT

## 2016-11-24 NOTE — H&P (Signed)
THE PATIENT WAS SEEN PRIOR TO SURGERY TODAY.  HISTORY, ALLERGIES, HOME MEDICATIONS AND OPERATIVE PROCEDURE WERE REVIEWED. RISKS AND BENEFITS OF SURGERY DISCUSSED WITH PATIENT AGAIN.  NO CHANGES FROM INITIAL HISTORY AND PHYSICAL NOTED.    

## 2016-11-24 NOTE — Anesthesia Procedure Notes (Signed)
Procedure Name: LMA Insertion Date/Time: 11/24/2016 1:09 PM Performed by: Michaele OfferSAVAGE, Zaid Tomes Pre-anesthesia Checklist: Patient identified, Emergency Drugs available, Suction available, Patient being monitored and Timeout performed Patient Re-evaluated:Patient Re-evaluated prior to inductionOxygen Delivery Method: Circle system utilized Preoxygenation: Pre-oxygenation with 100% oxygen Intubation Type: IV induction Ventilation: Mask ventilation without difficulty LMA: LMA inserted LMA Size: 4.0 Number of attempts: 1 Placement Confirmation: positive ETCO2 and breath sounds checked- equal and bilateral Tube secured with: Tape Dental Injury: Teeth and Oropharynx as per pre-operative assessment

## 2016-11-24 NOTE — Anesthesia Preprocedure Evaluation (Addendum)
Anesthesia Evaluation  Patient identified by MRN, date of birth, ID band Patient awake    Reviewed: Allergy & Precautions, NPO status , Patient's Chart, lab work & pertinent test results  History of Anesthesia Complications Negative for: history of anesthetic complications  Airway Mallampati: III       Dental  (+) Edentulous Upper, Edentulous Lower   Pulmonary shortness of breath and with exertion, neg sleep apnea, COPD,  COPD inhaler, neg recent URI, Current Smoker,           Cardiovascular Exercise Tolerance: Good negative cardio ROS       Neuro/Psych  Headaches, neg Seizures PSYCHIATRIC DISORDERS Peripheral neuropathy  Neuromuscular disease    GI/Hepatic hiatal hernia, GERD  Medicated and Controlled,(+)     substance abuse  alcohol use and cocaine use, Hepatitis - (s/p treatment), CDiverticulitis Hx   Endo/Other  neg diabetesHypothyroidism   Renal/GU negative Renal ROS  negative genitourinary   Musculoskeletal  (+) Arthritis , Osteoarthritis,    Abdominal (+) + obese,   Peds negative pediatric ROS (+)  Hematology negative hematology ROS (+)   Anesthesia Other Findings Past Medical History: No date: Anxiety No date: Arthritis     Comment: neck No date: Bipolar disorder (HCC) No date: Chronic constipation     Comment: on Linzess, Miralax, Zofran 11/02/2015: Chronic insomnia No date: COPD, moderate (HCC) No date: Depression No date: Depression with anxiety     Comment: Followed by Dr. [name], [facility]               [facility] and [name]               for thrapy No date: GERD (gastroesophageal reflux disease) No date: Hepatitis     Comment: Hep C, history, has been treated and cured per              pt. No date: History of chicken pox No date: History of cocaine use No date: History of hiatal hernia No date: History of pneumonia No date: Hypothyroidism, adult  Comment: Working with Dr. Horton ChinShamil [name],               Endocrinology. S/P RAIU and is planning on               biopsy No date: Migraine without status migrainosus, not intra* No date: Nicotine dependence, uncomplicated No date: Nontoxic multinodular goiter     Comment: FNA done 10/30/14 with benign results. Seen               [name]. Patient may want a second opinion. No date: Rhinitis, allergic No date: Vitamin D deficiency   Reproductive/Obstetrics negative OB ROS                             Anesthesia Physical  Anesthesia Plan  ASA: III  Anesthesia Plan: General   Post-op Pain Management:    Induction: Intravenous  Airway Management Planned: LMA  Additional Equipment:   Intra-op Plan:   Post-operative Plan: Extubation in OR  Informed Consent: I have reviewed the patients History and Physical, chart, labs and discussed the procedure including the risks, benefits and alternatives for the proposed anesthesia with the patient or authorized representative who has indicated his/her understanding and acceptance.   Dental advisory given  Plan Discussed with: CRNA and Surgeon  Anesthesia Plan Comments:        Anesthesia Quick Evaluation

## 2016-11-24 NOTE — Op Note (Signed)
11/24/2016  1:47 PM  PATIENT:  Sabrina Espinoza    PRE-OPERATIVE DIAGNOSIS: RIGHT CARPAL TUNNEL SYNDROME POST-OPERATIVE DIAGNOSIS: RIGHT CARPAL TUNNEL SYNDROME  PROCEDURE:  RIGHT CARPAL TUNNEL RELEASE  SURGEON: Valinda HoarHOWARD E Marsalis Beaulieu, MD    ANESTHESIA:   General  TOURNIQUET TIME: 16   MIN  PREOPERATIVE INDICATIONS:  Sabrina Rasudrey E Espinoza is a  51 y.o. female with a diagnosis of right carpal tunnel syndrome who failed conservative measures and elected for surgical management.    The risks benefits and alternatives were discussed with the patient preoperatively including but not limited to the risks of infection, bleeding, nerve injury, incomplete relief of symptoms, pillar pain, cardiopulmonary complications, the need for revision surgery, among others, and the patient was willing to proceed.  OPERATIVE FINDINGS: Thickened volar ligament and nerve compression.  OPERATIVE PROCEDURE: The patient is brought to the operating room placed in the supine position. General anesthesia was administered. The right upper extremity was prepped and draped in usual sterile fashion. Time out was performed. The arm was elevated and exsanguinated and the tourniquet was inflated. Incision was made in line with the radial border of the ring finger. The carpal tunnel transverse fascia was identified, cleaned, and incised sharply. The common sensory branches were visualized along with the superficial palmar arch and protected.  The median nerve was protected below  A Kelly clamp was  placed underneath the transverse carpal ligament, protecting the nerve. I released the ligament completely, and then released the proximal distal volar forearm fascia. The nerve was identified, and visualized, and protected throughout the case. The motor branch was intact upon inspection.  No masses or abnormalities were identified in ulnar bursa.  The wounds were irrigated copiously, and the wounds injected with 1/2% sensorcaine, and the skin closed  with nylon followed by a volar splint and sterile gauze.  Tourniquet was deflated with good return of blood flow to all fingers. Sponge and needle counts were correct.  The patient tolerated this well, with no complications. The patient was awakened and taken to recovery in good condition.

## 2016-11-24 NOTE — Transfer of Care (Signed)
Immediate Anesthesia Transfer of Care Note  Patient: Sabrina Espinoza  Procedure(s) Performed: Procedure(s): CARPAL TUNNEL RELEASE (Right)  Patient Location: PACU  Anesthesia Type:General  Level of Consciousness: awake, oriented and patient cooperative  Airway & Oxygen Therapy: Patient Spontanous Breathing and Patient connected to face mask oxygen  Post-op Assessment: Report given to RN, Post -op Vital signs reviewed and stable and Patient moving all extremities X 4  Post vital signs: Reviewed and stable  Last Vitals:  Vitals:   11/24/16 1219 11/24/16 1357  BP: 101/75 102/75  Pulse: 94 80  Resp: 18 14  Temp: 36.8 C 37.1 C    Last Pain:  Vitals:   11/24/16 1219  TempSrc: Oral  PainSc: 8          Complications: No apparent anesthesia complications

## 2016-11-24 NOTE — Anesthesia Post-op Follow-up Note (Cosign Needed)
Anesthesia QCDR form completed.        

## 2016-11-25 ENCOUNTER — Emergency Department
Admission: EM | Admit: 2016-11-25 | Discharge: 2016-11-25 | Disposition: A | Discharge status: Home / Self Care | Payer: Medicaid - Out of State | Attending: Emergency Medicine | Admitting: Emergency Medicine

## 2016-11-25 ENCOUNTER — Encounter: Payer: Self-pay | Admitting: Specialist

## 2016-11-25 DIAGNOSIS — G8918 Other acute postprocedural pain: Secondary | ICD-10-CM | POA: Insufficient documentation

## 2016-11-25 DIAGNOSIS — Z79899 Other long term (current) drug therapy: Secondary | ICD-10-CM | POA: Insufficient documentation

## 2016-11-25 DIAGNOSIS — J449 Chronic obstructive pulmonary disease, unspecified: Secondary | ICD-10-CM | POA: Diagnosis not present

## 2016-11-25 DIAGNOSIS — F17210 Nicotine dependence, cigarettes, uncomplicated: Secondary | ICD-10-CM | POA: Insufficient documentation

## 2016-11-25 DIAGNOSIS — M25531 Pain in right wrist: Secondary | ICD-10-CM | POA: Insufficient documentation

## 2016-11-25 MED ORDER — MORPHINE SULFATE (PF) 4 MG/ML IV SOLN
INTRAVENOUS | Status: AC
  Filled 2016-11-25: qty 1

## 2016-11-25 MED ORDER — MORPHINE SULFATE (PF) 4 MG/ML IV SOLN
4.0000 mg | Freq: Once | INTRAVENOUS | Status: AC
  Administered 2016-11-25: 4 mg via INTRAVENOUS

## 2016-11-25 MED ORDER — ONDANSETRON HCL 4 MG/2ML IJ SOLN
INTRAMUSCULAR | Status: AC
  Filled 2016-11-25: qty 2

## 2016-11-25 MED ORDER — ONDANSETRON HCL 4 MG/2ML IJ SOLN
4.0000 mg | Freq: Once | INTRAMUSCULAR | Status: AC
  Administered 2016-11-25: 4 mg via INTRAVENOUS

## 2016-11-25 NOTE — ED Notes (Signed)
Pt states had surgery on RT wrist yesterday, pt states increased pain described as "burnign". [facility] and was told to follow up here for " IV pain meds".

## 2016-11-25 NOTE — ED Triage Notes (Signed)
Patient presents to the ED with severe right hand pain post carpal tunnel release surgery yesterday.  Patient went to ortho office today and doctor unwrapped patient's bandage and told patient there did not appear to be signs of infection but that if her pain was not under control taking, hydrocodone, gabapentin, and mobic that she should come to the ED for IV pain control.  Patient appears tired and uncomfortable.  Patient states pain began around 4am this morning.

## 2016-11-25 NOTE — Discharge Instructions (Signed)
Please seek medical attention for any high fevers, chest pain, shortness of breath, change in behavior, persistent vomiting, bloody stool or any other new or concerning symptoms.  

## 2016-11-25 NOTE — ED Provider Notes (Signed)
[facility] Emergency Department Provider Note  ____________________________________________   I have reviewed the triage vital signs and the nursing notes.   HISTORY  Chief Complaint Post-op Problem   History limited by: Not Limited   HPI Sabrina Espinoza is a 51 y.o. female who presents to the emergency department today because of concerns for postoperative pain. The patient underwent right carpal tunnel surgery yesterday. This morning around 4 AM when she states she thinks the numbing medication went off, she started having severe pain. It is located right where the incision site is. She tried doubling up on her over-the-counter pain medications. The patient went back to her orthopedic doctor's office where they referred her to the emergency department for IV pain control.   Past Medical History:  Diagnosis Date  . Anxiety   . Arthritis    neck  . Bipolar disorder (HCC)   . Chronic constipation    on Linzess, Miralax, Zofran  . Chronic insomnia 11/02/2015  . COPD, moderate (HCC)   . Depression   . Depression with anxiety    Followed by Dr. Lynett Fish, psychiatrist at [facility] and Wilma Flavin for thrapy  . GERD (gastroesophageal reflux disease)   . Hepatitis    Hep C, history, has been treated and cured per pt.  . History of chicken pox   . History of cocaine use   . History of hiatal hernia   . History of pneumonia   . Hypothyroidism, adult    Working with Dr. Horton Chin, Endocrinology. S/P RAIU and is planning on biopsy  . Migraine without status migrainosus, not intractable   . Nicotine dependence, uncomplicated   . Nontoxic multinodular goiter    FNA done 10/30/14 with benign results. Seen [name]. Patient may want a second opinion.  . Rhinitis, allergic   . Vitamin D deficiency     Patient Active Problem List   Diagnosis Date Noted  . Abdominal pain 06/21/2016  . Cellulitis of abdominal wall 06/21/2016  . UTI  (urinary tract infection) 06/21/2016  . Sepsis (HCC) 06/21/2016  . Acute cystitis without hematuria   . Fever 06/20/2016  . Postoperative intra-abdominal abscess (HCC) 06/13/2016  . Diverticulitis large intestine 04/17/2016  . Diverticulitis of colon with perforation   . Pallor 03/11/2016  . Tongue papillae atrophy 03/11/2016  . Encounter for medication monitoring 03/11/2016  . Peripheral neuropathy 03/11/2016  . Thrush 03/11/2016  . Primary insomnia 02/04/2016  . Right cervical radiculopathy 01/15/2016  . Chronic insomnia 11/02/2015  . Right knee pain 08/22/2015  . Midline low back pain without sciatica 05/07/2015  . Elevated blood pressure 04/20/2015  . Polypharmacy 03/22/2015  . Foot pain, right 03/16/2015  . Pain pharynx 03/08/2015  . GERD without esophagitis 03/08/2015  . Abnormal mammogram of right breast 03/07/2015  . Degenerative disc disease, cervical 02/26/2015  . Bilateral leg cramps 02/26/2015  . Skin lesions, generalized 02/26/2015  . Allergic rhinitis with postnasal drip 01/31/2015  . Chronic sinusitis with recurrent bronchitis 01/31/2015  . Oral thrush 01/31/2015  . History of cocaine use 01/16/2015  . 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 Chronic hepatitis C without hepatic coma (HCC) 01/16/2015  . Chronic constipation 01/16/2015  . COPD, moderate (HCC) 01/16/2015  . Cigarette smoker 01/16/2015  . Bipolar 1 disorder, mixed, partial remission (HCC) 01/16/2015  . Migraine without aura and without status migrainosus, not intractable 01/16/2015  . Depression with anxiety 01/16/2015  . Cephalalgia 03/06/2014  . Imbalance 03/06/2014  . Insomnia  due to medical condition 03/06/2014  . Sleep disorder 03/06/2014    Past Surgical History:  Procedure Laterality Date  . ANKLE FRACTURE SURGERY  1988   rods and pins in place  . APPENDECTOMY  04/17/2016   Procedure: APPENDECTOMY;  Surgeon: [name], MD;  Location: [facility] ORS;  Service: General;;  . CARPAL  TUNNEL RELEASE Right 11/24/2016   Procedure: CARPAL TUNNEL RELEASE;  Surgeon: [name], [name], MD;  Location: [facility] ORS;  Service: Orthopedics;  Laterality: Right;  . CESAREAN SECTION  1996  . COLECTOMY WITH COLOSTOMY CREATION/HARTMANN PROCEDURE N/A 04/17/2016   Procedure: COLOSTOMY CREATION/HARTMANN PROCEDURE;  Surgeon: [name], MD;  Location: [facility] ORS;  Service: General;  Laterality: N/A;  . ECTOPIC PREGNANCY SURGERY  2003  . LAPAROTOMY N/A 04/17/2016   Procedure: EXPLORATORY LAPAROTOMY;  Surgeon: [name], MD;  Location: [facility] ORS;  Service: General;  Laterality: N/A;  . NECK SURGERY      Prior to Admission medications   Medication Sig Start Date End Date Taking? Authorizing Provider  acetaminophen (TYLENOL) 500 MG tablet Take 1,000 mg by mouth every 6 (six) hours as needed for moderate pain or headache.    [provider]  Aspirin-Salicylamide-Caffeine (BC HEADACHE PO) Take 1 packet by mouth 3 (three) times daily as needed (headache).    [provider]  azelastine (ASTELIN) 0.1 % nasal spray Place 1 spray into the nose 2 (two) times daily. Use in each nostril as directed     [provider]  BIOTIN PO Take 1 tablet by mouth 2 (two) times daily.    [provider]  Brexpiprazole (REXULTI) 3 MG TABS Take 3 mg by mouth daily.    [provider]  budesonide-formoterol (SYMBICORT) 160-4.5 MCG/ACT inhaler Inhale 2 puffs into the lungs 2 (two) times daily. USE 2 PUFFS 2 TIMES A DAY Patient taking differently: Inhale 2 puffs into the lungs 2 (two) times daily.  11/02/15   Lada, Janit BernMelinda P, MD  butalbital-acetaminophen-caffeine (FIORICET, ESGIC) 50-325-40 MG tablet Take 1 tablet by mouth every 4 (four) hours as needed for headache.     [provider]  Calcium Carb-Cholecalciferol (CALCIUM 600+D3) 600-800 MG-UNIT TABS Take 1 tablet by mouth 2 (two) times daily.    [provider]  calcium carbonate (TUMS - DOSED IN MG ELEMENTAL  CALCIUM) 500 MG chewable tablet Chew 1-2 tablets by mouth 4 (four) times daily as needed for indigestion or heartburn.    [provider]  clonazePAM (KLONOPIN) 1 MG tablet Take 1 mg by mouth 3 (three) times daily.  02/09/14   [provider]  diltiazem (CARDIZEM CD) 180 MG 24 hr capsule Take 1 capsule (180 mg total) by mouth daily. 04/30/16   Henrene DodgePiscoya, Jose, MD  dimenhyDRINATE (DRAMAMINE) 50 MG tablet Take 50 mg by mouth 2 (two) times daily as needed for nausea or dizziness.     [provider]  EPIPEN 2-PAK 0.3 MG/0.3ML SOAJ injection Inject 0.3 mg into the muscle as needed (allergic reaction).  02/23/15   [provider]  fluticasone (FLONASE) 50 MCG/ACT nasal spray Place 1 spray into both nostrils 2 (two) times daily. 12/11/15   Kerman PasseyLada, Melinda P, MD  gabapentin (NEURONTIN) 400 MG capsule Take 400 mg by mouth 3 (three) times daily.  02/09/14   [provider]  HYDROcodone-acetaminophen (NORCO) 5-325 MG tablet Take 1 tablet by mouth every 6 (six) hours as needed. 11/24/16   [name], [name], MD  hydrOXYzine (VISTARIL) 50 MG capsule Take  50 mg by mouth 2 (two) times daily.    [provider]  lamoTRIgine (LAMICTAL) 150 MG tablet Take 300 mg by mouth daily.    [provider]  levocetirizine (XYZAL) 5 MG tablet Take 5 mg by mouth every evening. 06/12/16   [provider]  meloxicam (MOBIC) 15 MG tablet Take 1 tablet (15 mg total) by mouth daily. 11/24/16   Deeann Saint, MD  montelukast (SINGULAIR) 10 MG tablet TAKE 1 TABLET (10 MG TOTAL) BY MOUTH AT BEDTIME. 01/23/16   Lada, Janit Bern, MD  mupirocin ointment (BACTROBAN) 2 % Apply to affected area 3 times daily Patient taking differently: Apply 1 application topically 3 (three) times daily as needed (wound care).  05/01/16 05/01/17  Emily Filbert, MD  nortriptyline (PAMELOR) 50 MG capsule Take 100 mg by mouth at bedtime.     [provider]  ondansetron (ZOFRAN) 4 MG tablet  Take 4 mg by mouth every 6 (six) hours as needed for nausea or vomiting.    [provider]  polyethylene glycol powder (GLYCOLAX/MIRALAX) powder Take 17 g by mouth daily as needed for mild constipation. Patient taking differently: Take 17 g by mouth 2 (two) times daily.  04/17/16   Lada, Janit Bern, MD  prazosin (MINIPRESS) 5 MG capsule Take 5 mg by mouth at bedtime.    [provider]  promethazine (PHENERGAN) 25 MG tablet Take 25 mg by mouth every 6 (six) hours as needed for nausea or vomiting.    [provider]  ranitidine (ZANTAC) 150 MG tablet Take 150 mg by mouth daily.    [provider]  rOPINIRole (REQUIP) 2 MG tablet Take 2 mg by mouth 2 (two) times daily. 06/28/16   [provider]  sodium chloride (OCEAN) 0.65 % SOLN nasal spray Place 1 spray into both nostrils 2 (two) times daily as needed for congestion.    [provider]  Suvorexant (BELSOMRA) 20 MG TABS Take 20 mg by mouth at bedtime.    [provider]  tiZANidine (ZANAFLEX) 4 MG tablet Take 2 mg by mouth daily.    [provider]  Tolnaftate (ABSORBINE JR EX) Apply 1 application topically daily as needed (pain).    [provider]  traMADol (ULTRAM) 50 MG tablet Take 50 mg by mouth every 6 (six) hours as needed for pain. 11/14/16   [provider]  VENTOLIN HFA 108 (90 Base) MCG/ACT inhaler Inhale 2 puffs into the lungs every 4 (four) hours as needed for wheezing or shortness of breath. Patient taking differently: Inhale 2 puffs into the lungs every 6 (six) hours as needed for wheezing or shortness of breath.  04/18/16   Kerman Passey, MD    Allergies Patient has no known allergies.  Family History  Problem Relation Age of Onset  . Heart disease Father   . Alcohol abuse Father   . Diabetes Father   . Heart disease Brother   . Depression Brother   . Stroke Brother   . Alcohol abuse Paternal Grandfather   . Osteoporosis Mother   .  Breast cancer Neg Hx     Social History Social History  Substance Use Topics  . Smoking status: Current Every Day Smoker    Packs/day: 1.00    Years: 32.00    Types: Cigarettes  . Smokeless tobacco: Never Used  . Alcohol use No    Review of Systems Constitutional: No fever/chills Eyes: No visual changes. ENT: No sore throat. Cardiovascular:  Denies chest pain. Respiratory: Denies shortness of breath. Gastrointestinal: No abdominal pain.  No nausea, no vomiting.  No diarrhea.   Genitourinary: Negative for dysuria. Musculoskeletal: Positive for right wrist pain.  Skin: Negative for rash. Neurological: Negative for headaches, focal weakness or numbness.  ____________________________________________   PHYSICAL EXAM:  VITAL SIGNS: ED Triage Vitals  Enc Vitals Group     BP 11/25/16 1803 (!) 142/95     Pulse Rate 11/25/16 1803 93     Resp 11/25/16 1803 18     Temp 11/25/16 1803 99.5 F (37.5 C)     Temp Source 11/25/16 1803 Oral     SpO2 11/25/16 1803 97 %     Weight 11/25/16 1803 164 lb (74.4 kg)     Height 11/25/16 1803 5\' 9"  (1.753 m)     Head Circumference --      Peak Flow --      Pain Score 11/25/16 1802 10   Constitutional: Alert and oriented. Well appearing and in no distress. Eyes: Conjunctivae are normal.  ENT   Head: Normocephalic and atraumatic.   Nose: No congestion/rhinnorhea.   Mouth/Throat: Mucous membranes are moist.   Neck: No stridor. Hematological/Lymphatic/Immunilogical: No cervical lymphadenopathy. Cardiovascular: Normal rate, regular rhythm.  No murmurs, rubs, or gallops. Respiratory: Normal respiratory effort without tachypnea nor retractions. Breath sounds are clear and equal bilaterally. No wheezes/rales/rhonchi. Gastrointestinal: Soft and non tender. No rebound. No guarding.  Genitourinary: Deferred Musculoskeletal: Tender to palpation around incision site. Incision is c/d/i Neurologic:  Normal speech and language. No gross  focal neurologic deficits are appreciated.  Skin:  Skin is warm, dry and intact. No rash noted. Psychiatric: Mood and affect are normal. Speech and behavior are normal. Patient exhibits appropriate insight and judgment.  ____________________________________________    LABS (pertinent positives/negatives)  None  ____________________________________________   EKG  None  ____________________________________________    RADIOLOGY  None  ____________________________________________   PROCEDURES  Procedures  ____________________________________________   INITIAL IMPRESSION / ASSESSMENT AND PLAN / ED COURSE  Pertinent labs & imaging results that were available during my care of the patient were reviewed by me and considered in my medical decision making (see chart for details).  Patient sent from orthopedic clinic for IV pain medication after carpal tunnel surgery yesterday. Patient was given IV pain medication here. She did stated she was ready to go home. Patient did not have any signs of infection on inspection of the incision site. She does have follow-up with orthopedics tomorrow.  ____________________________________________   FINAL CLINICAL IMPRESSION(S) / ED DIAGNOSES  Final diagnoses:  Post-operative pain     Note: This dictation was prepared with Dragon dictation. Any transcriptional errors that result from this process are unintentional     Phineas Semen, MD 11/25/16 2146

## 2016-11-25 NOTE — ED Notes (Signed)
FN: pt sent over from [facility] for IV pain control, pt had surgery on right hand yesterday and is experiencing severe pain.

## 2016-11-27 NOTE — Anesthesia Postprocedure Evaluation (Signed)
Anesthesia Post Note  Patient: Sabrina Espinoza  Procedure(s) Performed: Procedure(s) (LRB): CARPAL TUNNEL RELEASE (Right)  Patient location during evaluation: PACU Anesthesia Type: General Level of consciousness: awake and alert Pain management: pain level controlled Vital Signs Assessment: post-procedure vital signs reviewed and stable Respiratory status: spontaneous breathing, nonlabored ventilation, respiratory function stable and patient connected to nasal cannula oxygen Cardiovascular status: blood pressure returned to baseline and stable Postop Assessment: no signs of nausea or vomiting Anesthetic complications: no     Last Vitals:  Vitals:   11/24/16 1514 11/24/16 1538  BP: 131/80 (!) 141/96  Pulse: 77 82  Resp: 16   Temp: 36.6 C     Last Pain:  Vitals:   11/24/16 1514  TempSrc: Temporal  PainSc: 4                  Lenard SimmerAndrew Emeka Lindner

## 2017-03-04 IMAGING — CT CT ABD-PELV W/ CM
2 of 5 series · 14 of 46 positions shown, 16 images · IV contrast (APPLIED)
Comparison: 04/17/2016

CLINICAL DATA: Abdominal pain, status post sigmoid colectomy,
pelvic abscess drainage

EXAM:
CT ABDOMEN AND PELVIS WITH CONTRAST
TECHNIQUE: Multidetector CT imaging of the abdomen and pelvis was performed
using the standard protocol following bolus administration of
intravenous contrast.
CONTRAST:  100mL 3RQYRD-D99 IOPAMIDOL (3RQYRD-D99) INJECTION 61%

[Series 2: axial st · axial · 0.88mm/px · z∈[-1206,-736]mm · 11 of 106 slices shown, 13 images]
[im 6/106  soft-tissue]
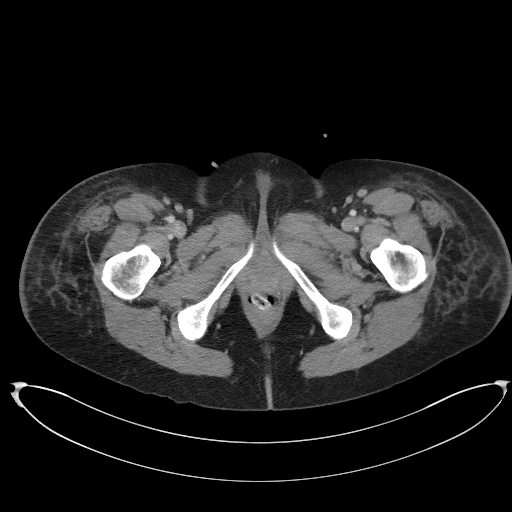
[im 6/106  bone]
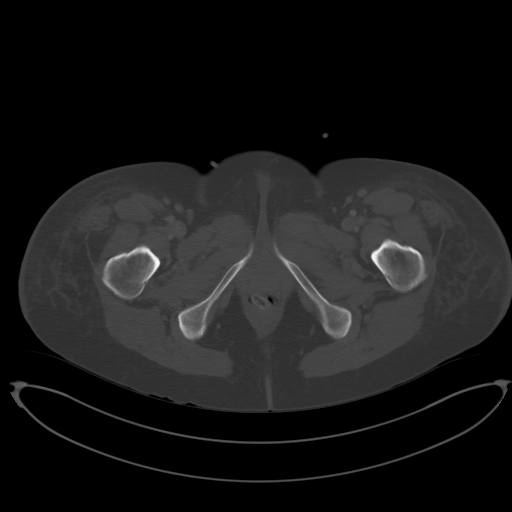
[im 16/106  soft-tissue]
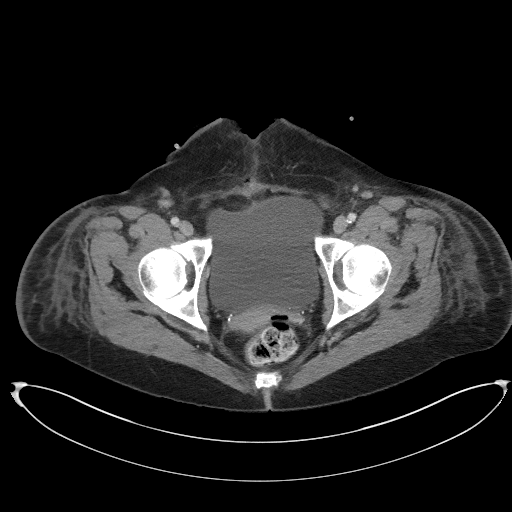
[im 27/106  soft-tissue]
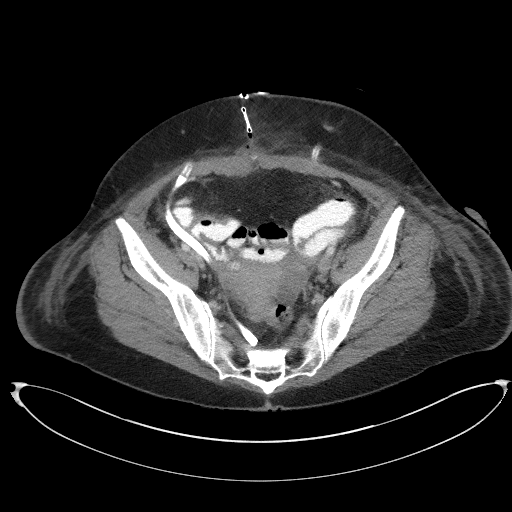
[im 37/106  soft-tissue]
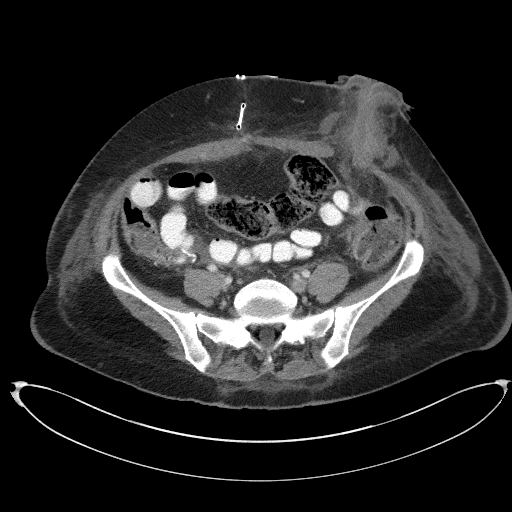
[im 43/106  soft-tissue]
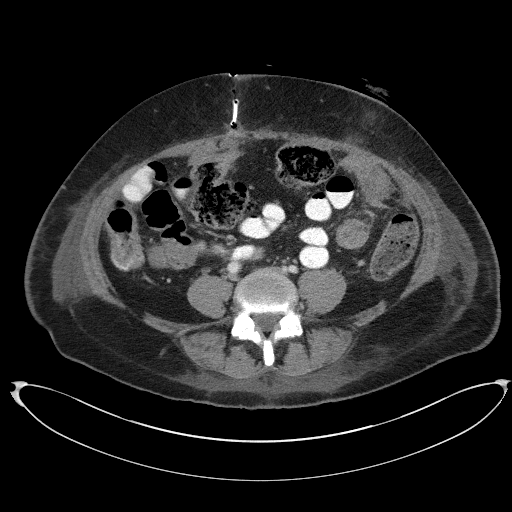
[im 53/106  soft-tissue]
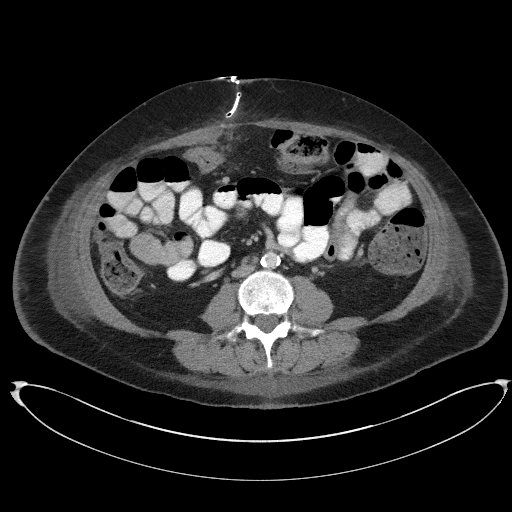
[im 64/106  soft-tissue]
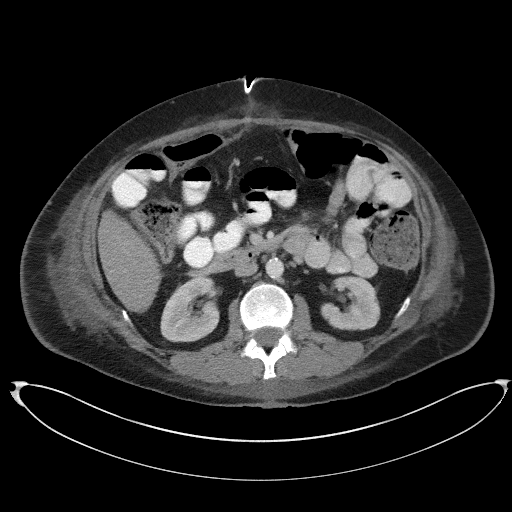
[im 69/106  soft-tissue]
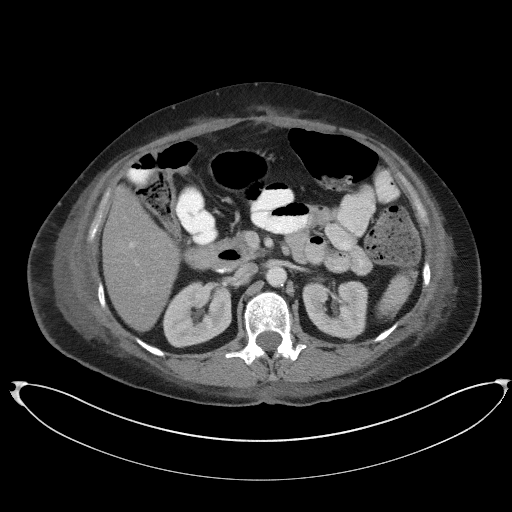
[im 79/106  soft-tissue]
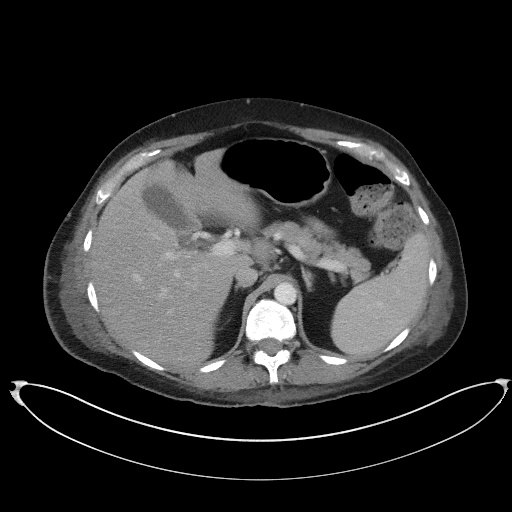
[im 79/106  bone]
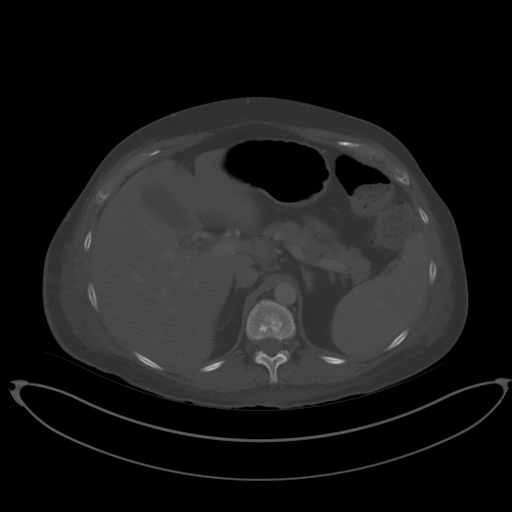
[im 90/106  soft-tissue]
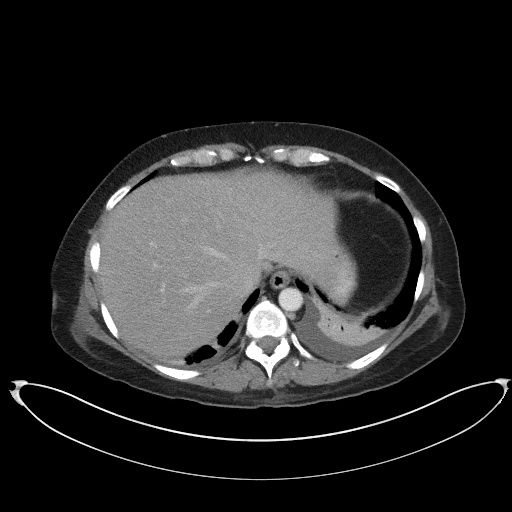
[im 100/106  soft-tissue]
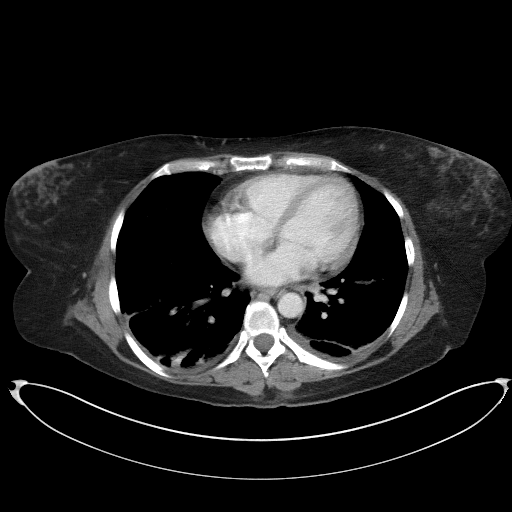

[Series 5: coronal st · coronal · 0.79mm/px · 3 of 99 slices shown]
[im 33/99  soft-tissue]
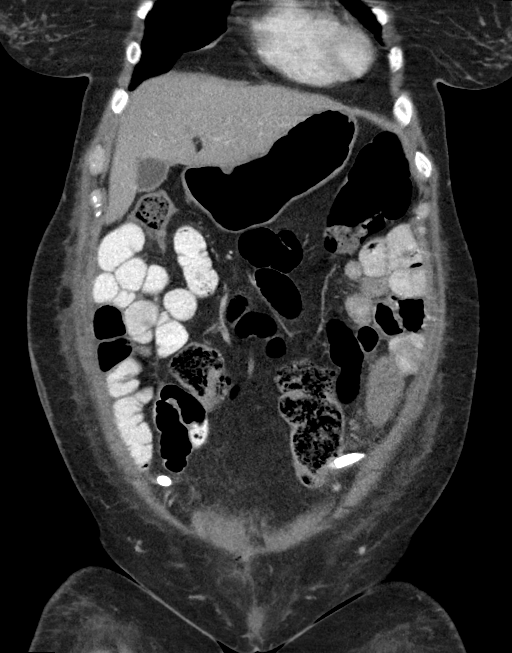
[im 44/99  soft-tissue]
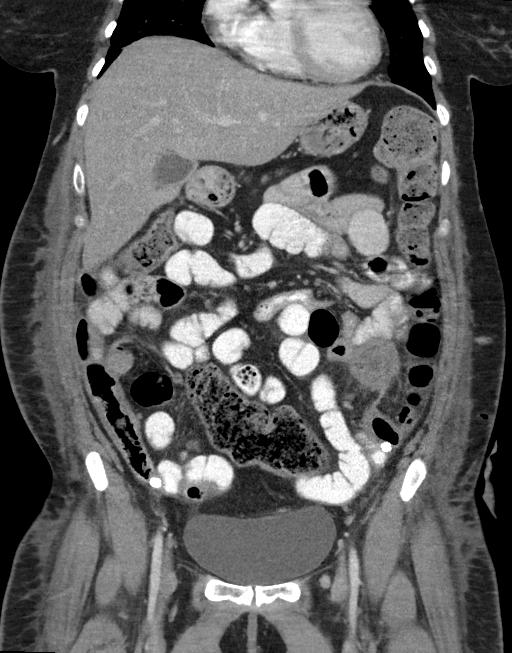
[im 55/99  soft-tissue]
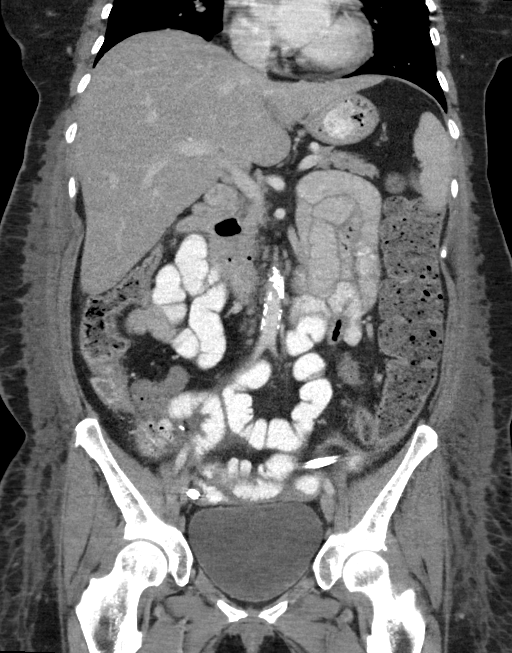

[14 of 46 positions shown; findings below may reference images not displayed]

FINDINGS: Lower chest: Lung bases shows bilateral small pleural effusion.
Bilateral lower lobe posterior atelectasis or infiltrate.

Hepatobiliary: There is fatty infiltration of the liver. No focal
hepatic mass.

Pancreas: Enhanced pancreas is unremarkable.

Spleen: Enhanced spleen is unremarkable.

Adrenals/Urinary Tract: No adrenal gland mass. Enhanced kidneys are
symmetrical in size. No hydronephrosis or hydroureter. Punctate
nonobstructive calcified calculus upper pole of the left kidney
measures 2 mm. Delayed renal images shows bilateral renal
symmetrical excretion. Bilateral visualized proximal ureter is
unremarkable.

No calcified calculi are noted within urinary bladder. No thickening
of urinary bladder wall.

Stomach/Bowel: Small hiatal hernia is noted. No gastric outlet
obstruction. No small bowel obstruction. Mild distended small bowel
loops with some air-fluid levels in lower abdomen probable mild
ileus. No transition point in caliber of small bowel to suggest
small bowel obstruction.

No pericecal inflammation. The patient is status post appendectomy.
The terminal ileum is unremarkable. Moderate colonic stool are noted
within transverse colon. Moderate colonic stool noted within splenic
flexure of the colon and left colon. The patient is status post
sigmoidectomy there is a colostomy in left lower quadrant of the
abdomen. Minimal stranding of subcutaneous fat surrounding the
colostomy probable mild edema or post surgical changes. There is a
midline abdominal wall scar. There are skin staples upper and mid
aspect of the scar. Post surgical material noted within subcutaneous
fat along the scar. Small amount of air is noted along the scar.
Please note there is partial anterior opening of lower aspect of the
scar. No definite abscess or fluid collection is noted along the
surgical scar.

Two postsurgical drains are noted within pelvis. There is no
evidence of pelvic abscess. Small residual stool noted within distal
sigmoid colon and rectum remnant.

Vascular/Lymphatic: No aortic aneurysm. No retroperitoneal or
mesenteric adenopathy. Atherosclerotic calcifications of abdominal
aorta and iliac arteries.

Reproductive: The uterus is atrophic. The uterus is anteflexed. No
adnexal masses noted.

Other: Mild anasarca infiltration of subcutaneous fat bilateral
flank wall. There is no evidence of free abdominal or pelvic air. No
abdominal ascites. Trace ascites noted posterior cul-de-sac.

Musculoskeletal: No destructive bony lesions are noted. Mild
degenerative changes lower thoracic and lumbar spine.
IMPRESSION: 1. Mild fatty infiltration of the liver. Left nonobstructive
nephrolithiasis. No hydronephrosis or hydroureter.
2. Status post sigmoidectomy. There is a colostomy in left lower
quadrant. Small amount of fluid and mild stranding of subcutaneous
fat adjacent to colostomy most likely edema or postsurgical in
nature. No definite evidence of abscess.
3. There is a midline abdominal wall scar. There are skin staples
upper and mid aspect of the scar. Post surgical material noted
within subcutaneous fat along the scar. Small amount of air is noted
along the scar. Please note there is partial anterior opening of
lower aspect of the scar. No definite abscess or fluid collection is
noted along the surgical scar.
4. Postsurgical drains are noted within pelvis. No evidence of
pelvic abscess.
5. Moderate stool within colon without definite evidence of colonic
obstruction. No evidence of acute colitis. Mild small bowel
distension with some air-fluid levels probable mild ileus.
6. Mild anasarca infiltration subcutaneous fat bilateral flank.
7. No hydronephrosis or hydroureter.

## 2017-03-18 ENCOUNTER — Other Ambulatory Visit: Payer: Self-pay | Admitting: Podiatry

## 2017-03-18 ENCOUNTER — Ambulatory Visit: Payer: Medicare Other

## 2017-03-18 DIAGNOSIS — M79605 Pain in left leg: Secondary | ICD-10-CM

## 2017-03-24 ENCOUNTER — Ambulatory Visit: Payer: Medicare Other

## 2017-04-13 DIAGNOSIS — M19079 Primary osteoarthritis, unspecified ankle and foot: Secondary | ICD-10-CM | POA: Insufficient documentation

## 2017-04-13 DIAGNOSIS — L720 Epidermal cyst: Secondary | ICD-10-CM | POA: Insufficient documentation

## 2017-04-28 ENCOUNTER — Other Ambulatory Visit: Payer: Self-pay | Admitting: Podiatry

## 2017-04-28 DIAGNOSIS — M79605 Pain in left leg: Secondary | ICD-10-CM

## 2017-05-04 ENCOUNTER — Ambulatory Visit
Admission: RE | Admit: 2017-05-04 | Discharge: 2017-05-04 | Disposition: A | Discharge status: Home / Self Care | Payer: 59 | Source: Ambulatory Visit | Attending: Podiatry | Admitting: Podiatry

## 2017-05-04 DIAGNOSIS — M79605 Pain in left leg: Secondary | ICD-10-CM | POA: Insufficient documentation

## 2017-05-13 ENCOUNTER — Other Ambulatory Visit: Payer: Self-pay | Admitting: Internal Medicine

## 2017-05-13 DIAGNOSIS — J449 Chronic obstructive pulmonary disease, unspecified: Secondary | ICD-10-CM

## 2017-06-02 ENCOUNTER — Ambulatory Visit: Payer: 59 | Admitting: Internal Medicine

## 2017-08-12 ENCOUNTER — Ambulatory Visit: Admission: RE | Admit: 2017-08-12 | Payer: 59 | Source: Ambulatory Visit | Admitting: Unknown Physician Specialty

## 2017-08-12 ENCOUNTER — Encounter: Admission: RE | Payer: Self-pay | Source: Ambulatory Visit

## 2017-08-12 SURGERY — COLONOSCOPY WITH PROPOFOL
Anesthesia: General

## 2017-09-09 ENCOUNTER — Other Ambulatory Visit: Payer: Self-pay | Admitting: Family Medicine

## 2017-09-09 DIAGNOSIS — Z1231 Encounter for screening mammogram for malignant neoplasm of breast: Secondary | ICD-10-CM

## 2017-09-28 ENCOUNTER — Ambulatory Visit
Admission: RE | Admit: 2017-09-28 | Discharge: 2017-09-28 | Disposition: A | Discharge status: Home / Self Care | Payer: 59 | Source: Ambulatory Visit | Attending: Family Medicine | Admitting: Family Medicine

## 2017-09-28 DIAGNOSIS — Z1231 Encounter for screening mammogram for malignant neoplasm of breast: Secondary | ICD-10-CM | POA: Diagnosis not present

## 2017-12-31 ENCOUNTER — Encounter: Payer: Self-pay | Admitting: Internal Medicine

## 2017-12-31 ENCOUNTER — Telehealth: Payer: Self-pay | Admitting: Internal Medicine

## 2017-12-31 NOTE — Telephone Encounter (Signed)
3 attempts to schedule fu appt from recall list.   Deleting recall.  °Mailed Letter  °

## 2018-01-29 ENCOUNTER — Emergency Department: Payer: 59

## 2018-01-29 ENCOUNTER — Emergency Department
Admission: EM | Admit: 2018-01-29 | Discharge: 2018-01-29 | Disposition: A | Discharge status: Home / Self Care | Payer: 59 | Attending: Emergency Medicine | Admitting: Emergency Medicine

## 2018-01-29 ENCOUNTER — Other Ambulatory Visit: Payer: Self-pay

## 2018-01-29 DIAGNOSIS — F17210 Nicotine dependence, cigarettes, uncomplicated: Secondary | ICD-10-CM | POA: Insufficient documentation

## 2018-01-29 DIAGNOSIS — K9402 Colostomy infection: Secondary | ICD-10-CM | POA: Diagnosis not present

## 2018-01-29 DIAGNOSIS — Z79899 Other long term (current) drug therapy: Secondary | ICD-10-CM | POA: Diagnosis not present

## 2018-01-29 DIAGNOSIS — R1032 Left lower quadrant pain: Secondary | ICD-10-CM | POA: Insufficient documentation

## 2018-01-29 DIAGNOSIS — L03311 Cellulitis of abdominal wall: Secondary | ICD-10-CM | POA: Diagnosis not present

## 2018-01-29 DIAGNOSIS — R109 Unspecified abdominal pain: Secondary | ICD-10-CM | POA: Diagnosis present

## 2018-01-29 DIAGNOSIS — K9409 Other complications of colostomy: Secondary | ICD-10-CM

## 2018-01-29 LAB — URINALYSIS, COMPLETE (UACMP) WITH MICROSCOPIC
Bacteria, UA: NONE SEEN
Bilirubin Urine: NEGATIVE
GLUCOSE, UA: NEGATIVE mg/dL
HGB URINE DIPSTICK: NEGATIVE
Ketones, ur: NEGATIVE mg/dL
Leukocytes, UA: NEGATIVE
Nitrite: NEGATIVE
Protein, ur: NEGATIVE mg/dL
SPECIFIC GRAVITY, URINE: 1.009 (ref 1.005–1.030)
pH: 6 (ref 5.0–8.0)

## 2018-01-29 LAB — CBC WITH DIFFERENTIAL/PLATELET
Basophils Absolute: 0.1 10*3/uL (ref 0–0.1)
Basophils Relative: 1 %
EOS PCT: 7 %
Eosinophils Absolute: 0.7 10*3/uL (ref 0–0.7)
HCT: 34.4 % — ABNORMAL LOW (ref 35.0–47.0)
Hemoglobin: 11.6 g/dL — ABNORMAL LOW (ref 12.0–16.0)
Lymphocytes Relative: 32 %
Lymphs Abs: 3.2 10*3/uL (ref 1.0–3.6)
MCH: 30.5 pg (ref 26.0–34.0)
MCHC: 33.8 g/dL (ref 32.0–36.0)
MCV: 90.1 fL (ref 80.0–100.0)
Monocytes Absolute: 0.6 10*3/uL (ref 0.2–0.9)
Monocytes Relative: 6 %
Neutro Abs: 5.2 10*3/uL (ref 1.4–6.5)
Neutrophils Relative %: 54 %
Platelets: 332 10*3/uL (ref 150–440)
RBC: 3.82 MIL/uL (ref 3.80–5.20)
RDW: 15.8 % — ABNORMAL HIGH (ref 11.5–14.5)
WBC: 9.8 10*3/uL (ref 3.6–11.0)

## 2018-01-29 LAB — COMPREHENSIVE METABOLIC PANEL
ALT: 12 U/L (ref 0–44)
ANION GAP: 7 (ref 5–15)
AST: 20 U/L (ref 15–41)
Albumin: 3.4 g/dL — ABNORMAL LOW (ref 3.5–5.0)
Alkaline Phosphatase: 99 U/L (ref 38–126)
BUN: 8 mg/dL (ref 6–20)
CO2: 29 mmol/L (ref 22–32)
Calcium: 9.3 mg/dL (ref 8.9–10.3)
Chloride: 103 mmol/L (ref 98–111)
Creatinine, Ser: 1 mg/dL (ref 0.44–1.00)
GFR calc Af Amer: >60 mL/min (ref >60)
GFR calc non Af Amer: >60 mL/min (ref >60)
Glucose, Bld: 78 mg/dL (ref 70–99)
Potassium: 3.4 mmol/L — ABNORMAL LOW (ref 3.5–5.1)
Sodium: 139 mmol/L (ref 135–145)
TOTAL PROTEIN: 6.5 g/dL (ref 6.5–8.1)
Total Bilirubin: 0.3 mg/dL (ref 0.3–1.2)

## 2018-01-29 LAB — POCT PREGNANCY, URINE: Preg Test, Ur: NEGATIVE

## 2018-01-29 MED ORDER — ONDANSETRON HCL 4 MG/2ML IJ SOLN
4.0000 mg | Freq: Once | INTRAMUSCULAR | Status: AC
  Administered 2018-01-29: 4 mg via INTRAVENOUS
  Filled 2018-01-29: qty 2

## 2018-01-29 MED ORDER — IOPAMIDOL (ISOVUE-300) INJECTION 61%
100.0000 mL | Freq: Once | INTRAVENOUS | Status: AC | PRN
  Administered 2018-01-29: 100 mL via INTRAVENOUS

## 2018-01-29 MED ORDER — MORPHINE SULFATE (PF) 4 MG/ML IV SOLN
4.0000 mg | Freq: Once | INTRAVENOUS | Status: AC
  Administered 2018-01-29: 4 mg via INTRAVENOUS
  Filled 2018-01-29: qty 1

## 2018-01-29 NOTE — ED Triage Notes (Signed)
Pt arrives via ems from home. Pt complains of skin tear under edge of colostomy bag, abd pain, and abdominal distention of the LLA. Unable to assess at this time due to bag placement. Pt a&o x 4 and able to ambulatory from ems stretcher around room without difficulties. NAD noted at this time

## 2018-01-29 NOTE — ED Provider Notes (Addendum)
[facility] Emergency Department Provider Note  ____________________________________________  Time seen: Approximately 3:22 PM  I have reviewed the triage vital signs and the nursing notes.   HISTORY  Chief Complaint rash around colostomy bag   HPI Sabrina Espinoza is a 52 y.o. female with a history of perforated diverticulitis status post resection with Gertie Gowda procedure and colostomy since 2017, bipolar, depression, and migraines who presents for evaluation of abdominal pain.  Patient reports pain since last night it is located in the left lower quadrant, sharp, constant, severe and nonradiating.  She also has noted an ulceration located adjacent to the ostomy site for the last few days.  She has had intermittent mild bleeding from the ostomy over the last few days.  Normal ostomy output.  Not constipated.  No nausea, no vomiting, no fever, no chills, no worsening distention of her baseline abdomen, no dysuria.   Past Medical History:  Diagnosis Date  . Anxiety   . Arthritis    neck  . Bipolar disorder (HCC)   . Chronic constipation    on Linzess, Miralax, Zofran  . Chronic insomnia 11/02/2015  . COPD, moderate (HCC)   . Depression   . Depression with anxiety    Followed by Dr. Lynett Fish, psychiatrist at [facility] and Wilma Flavin for thrapy  . GERD (gastroesophageal reflux disease)   . Hepatitis    Hep C, history, has been treated and cured per pt.  . History of chicken pox   . History of cocaine use   . History of hiatal hernia   . History of pneumonia   . Hypothyroidism, adult    Working with Dr. Horton Chin, Endocrinology. S/P RAIU and is planning on biopsy  . Migraine without status migrainosus, not intractable   . Nicotine dependence, uncomplicated   . Nontoxic multinodular goiter    FNA done 10/30/14 with benign results. Seen [name]. Patient may want a second opinion.  . Rhinitis, allergic   . Vitamin D deficiency      Patient Active Problem List   Diagnosis Date Noted  . Abdominal pain 06/21/2016  . Cellulitis of abdominal wall 06/21/2016  . UTI (urinary tract infection) 06/21/2016  . Sepsis (HCC) 06/21/2016  . Acute cystitis without hematuria   . Fever 06/20/2016  . Postoperative intra-abdominal abscess 06/13/2016  . Diverticulitis large intestine 04/17/2016  . Diverticulitis of colon with perforation   . Pallor 03/11/2016  . Tongue papillae atrophy 03/11/2016  . Encounter for medication monitoring 03/11/2016  . Peripheral neuropathy 03/11/2016  . Thrush 03/11/2016  . Primary insomnia 02/04/2016  . Right cervical radiculopathy 01/15/2016  . Chronic insomnia 11/02/2015  . Right knee pain 08/22/2015  . Midline low back pain without sciatica 05/07/2015  . Elevated blood pressure 04/20/2015  . Polypharmacy 03/22/2015  . Foot pain, right 03/16/2015  . Pain pharynx 03/08/2015  . GERD without esophagitis 03/08/2015  . Abnormal mammogram of right breast 03/07/2015  . Degenerative disc disease, cervical 02/26/2015  . Bilateral leg cramps 02/26/2015  . Skin lesions, generalized 02/26/2015  . Allergic rhinitis with postnasal drip 01/31/2015  . Chronic sinusitis with recurrent bronchitis 01/31/2015  . Oral thrush 01/31/2015  . History of cocaine use 01/16/2015  . 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 Chronic hepatitis C without hepatic coma (HCC) 01/16/2015  . Chronic constipation 01/16/2015  . COPD, moderate (HCC) 01/16/2015  . Cigarette smoker 01/16/2015  . Bipolar 1 disorder, mixed, partial remission (HCC) 01/16/2015  . Migraine without  aura and without status migrainosus, not intractable 01/16/2015  . Depression with anxiety 01/16/2015  . Cephalalgia 03/06/2014  . Imbalance 03/06/2014  . Insomnia due to medical condition 03/06/2014  . Sleep disorder 03/06/2014    Past Surgical History:  Procedure Laterality Date  . ANKLE FRACTURE SURGERY  1988   rods and pins in place  .  APPENDECTOMY  04/17/2016   Procedure: APPENDECTOMY;  Surgeon: [name], MD;  Location: [facility] ORS;  Service: General;;  . CARPAL TUNNEL RELEASE Right 11/24/2016   Procedure: CARPAL TUNNEL RELEASE;  Surgeon: [name], [name], MD;  Location: [facility] ORS;  Service: Orthopedics;  Laterality: Right;  . CESAREAN SECTION  1996  . COLECTOMY WITH COLOSTOMY CREATION/HARTMANN PROCEDURE N/A 04/17/2016   Procedure: COLOSTOMY CREATION/HARTMANN PROCEDURE;  Surgeon: [name], MD;  Location: [facility] ORS;  Service: General;  Laterality: N/A;  . ECTOPIC PREGNANCY SURGERY  2003  . LAPAROTOMY N/A 04/17/2016   Procedure: EXPLORATORY LAPAROTOMY;  Surgeon: [name], MD;  Location: [facility] ORS;  Service: General;  Laterality: N/A;  . NECK SURGERY      Prior to Admission medications   Medication Sig Start Date End Date Taking? Authorizing Provider  acetaminophen (TYLENOL) 500 MG tablet Take 1,000 mg by mouth every 6 (six) hours as needed for moderate pain or headache.    [provider]  Aspirin-Salicylamide-Caffeine (BC HEADACHE PO) Take 1 packet by mouth 3 (three) times daily as needed (headache).    [provider]  azelastine (ASTELIN) 0.1 % nasal spray Place 1 spray into the nose 2 (two) times daily. Use in each nostril as directed     [provider]  BIOTIN PO Take 1 tablet by mouth 2 (two) times daily.    [provider]  Brexpiprazole (REXULTI) 3 MG TABS Take 3 mg by mouth daily.    [provider]  budesonide-formoterol (SYMBICORT) 160-4.5 MCG/ACT inhaler Inhale 2 puffs into the lungs 2 (two) times daily. USE 2 PUFFS 2 TIMES A DAY Patient taking differently: Inhale 2 puffs into the lungs 2 (two) times daily.  11/02/15   Lada, Janit BernMelinda P, MD  butalbital-acetaminophen-caffeine (FIORICET, ESGIC) 50-325-40 MG tablet Take 1 tablet by mouth every 4 (four) hours as needed for headache.     [provider]  Calcium Carb-Cholecalciferol (CALCIUM 600+D3) 600-800  MG-UNIT TABS Take 1 tablet by mouth 2 (two) times daily.    [provider]  calcium carbonate (TUMS - DOSED IN MG ELEMENTAL CALCIUM) 500 MG chewable tablet Chew 1-2 tablets by mouth 4 (four) times daily as needed for indigestion or heartburn.    [provider]  clonazePAM (KLONOPIN) 1 MG tablet Take 1 mg by mouth 3 (three) times daily.  02/09/14   [provider]  diltiazem (CARDIZEM CD) 180 MG 24 hr capsule Take 1 capsule (180 mg total) by mouth daily. 04/30/16   Henrene DodgePiscoya, Jose, MD  dimenhyDRINATE (DRAMAMINE) 50 MG tablet Take 50 mg by mouth 2 (two) times daily as needed for nausea or dizziness.     [provider]  EPIPEN 2-PAK 0.3 MG/0.3ML SOAJ injection Inject 0.3 mg into the muscle as needed (allergic reaction).  02/23/15   [provider]  fluticasone (FLONASE) 50 MCG/ACT nasal spray Place 1 spray into both nostrils 2 (two) times daily. 12/11/15   Kerman PasseyLada, Melinda P, MD  gabapentin (NEURONTIN) 400 MG capsule Take 400 mg by mouth 3 (three) times daily.  02/09/14   [provider]  HYDROcodone-acetaminophen (NORCO) 5-325 MG  tablet Take 1 tablet by mouth every 6 (six) hours as needed. 11/24/16   Deeann Saint, MD  hydrOXYzine (VISTARIL) 50 MG capsule Take 50 mg by mouth 2 (two) times daily.    [provider]  lamoTRIgine (LAMICTAL) 150 MG tablet Take 300 mg by mouth daily.    [provider]  levocetirizine (XYZAL) 5 MG tablet Take 5 mg by mouth every evening. 06/12/16   [provider]  meloxicam (MOBIC) 15 MG tablet Take 1 tablet (15 mg total) by mouth daily. 11/24/16   Deeann Saint, MD  montelukast (SINGULAIR) 10 MG tablet TAKE 1 TABLET (10 MG TOTAL) BY MOUTH AT BEDTIME. 01/23/16   Lada, Janit Bern, MD  nortriptyline (PAMELOR) 50 MG capsule Take 100 mg by mouth at bedtime.     [provider]  ondansetron (ZOFRAN) 4 MG tablet Take 4 mg by mouth every 6 (six) hours as needed for nausea or vomiting.    [provider]  polyethylene glycol powder (GLYCOLAX/MIRALAX) powder Take 17 g by mouth daily as needed for mild constipation. Patient taking differently: Take 17 g by mouth 2 (two) times daily.  04/17/16   Lada, Janit Bern, MD  prazosin (MINIPRESS) 5 MG capsule Take 5 mg by mouth at bedtime.    [provider]  promethazine (PHENERGAN) 25 MG tablet Take 25 mg by mouth every 6 (six) hours as needed for nausea or vomiting.    [provider]  ranitidine (ZANTAC) 150 MG tablet Take 150 mg by mouth daily.    [provider]  rOPINIRole (REQUIP) 2 MG tablet Take 2 mg by mouth 2 (two) times daily. 06/28/16   [provider]  sodium chloride (OCEAN) 0.65 % SOLN nasal spray Place 1 spray into both nostrils 2 (two) times daily as needed for congestion.    [provider]  Suvorexant (BELSOMRA) 20 MG TABS Take 20 mg by mouth at bedtime.    [provider]  tiZANidine (ZANAFLEX) 4 MG tablet Take 2 mg by mouth daily.    [provider]  Tolnaftate (ABSORBINE JR EX) Apply 1 application topically daily as needed (pain).    [provider]  traMADol (ULTRAM) 50 MG tablet Take 50 mg by mouth every 6 (six) hours as needed for pain. 11/14/16   [provider]  VENTOLIN HFA 108 (90 Base) MCG/ACT inhaler Inhale 2 puffs into the lungs every 4 (four) hours as needed for wheezing or shortness of breath. Patient taking differently: Inhale 2 puffs into the lungs every 6 (six) hours as needed for wheezing or shortness of breath.  04/18/16   Kerman Passey, MD    Allergies Patient has no known allergies.  Family History  Problem Relation Age of Onset  . Heart disease Father   . Alcohol abuse Father   . Diabetes Father   . Heart disease Brother   . Depression Brother   . Stroke Brother   . Alcohol abuse Paternal Grandfather   . Osteoporosis Mother   . Breast cancer Neg Hx     Social History Social History   Tobacco Use  .  Smoking status: Current Every Day Smoker    Packs/day: 1.00    Years: 32.00    Pack years: 32.00    Types: Cigarettes  . Smokeless tobacco: Never Used  Substance Use Topics  . Alcohol use: No    Alcohol/week: 0.0 oz  . Drug use: No    Comment: history of cocaine use,  clean for 3.5 years     Review of Systems  Constitutional: Negative for fever. Eyes: Negative for visual changes. ENT: Negative for sore throat. Neck: No neck pain  Cardiovascular: Negative for chest pain. Respiratory: Negative for shortness of breath. Gastrointestinal: + LLQ abdominal pain. No vomiting or diarrhea. Genitourinary: Negative for dysuria. Musculoskeletal: Negative for back pain. Skin: Negative for rash. Neurological: Negative for headaches, weakness or numbness. Psych: No SI or HI  ____________________________________________   PHYSICAL EXAM:  VITAL SIGNS: ED Triage Vitals  Enc Vitals Group     BP 01/29/18 1434 116/80     Pulse Rate 01/29/18 1434 97     Resp 01/29/18 1434 16     Temp 01/29/18 1434 98.1 F (36.7 C)     Temp Source 01/29/18 1434 Oral     SpO2 01/29/18 1434 98 %     Weight 01/29/18 1436 175 lb (79.4 kg)     Height 01/29/18 1436 5\' 9"  (1.753 m)     Head Circumference --      Peak Flow --      Pain Score 01/29/18 1435 7     Pain Loc --      Pain Edu? --      Excl. in GC? --     Constitutional: Alert and oriented. Well appearing and in no apparent distress. HEENT:      Head: Normocephalic and atraumatic.         Eyes: Conjunctivae are normal. Sclera is non-icteric.       Mouth/Throat: Mucous membranes are moist.       Neck: Supple with no signs of meningismus. Cardiovascular: Regular rate and rhythm. No murmurs, gallops, or rubs. 2+ symmetrical distal pulses are present in all extremities. No JVD. Respiratory: Normal respiratory effort. Lungs are clear to auscultation bilaterally. No wheezes, crackles, or rhonchi.  Gastrointestinal: Soft, distended worse on the LLQ  with diffuse ttp over the LLQ, positive bowel sounds. No rebound or guarding. There is a large skin tear/ ulceration adjacent to the ostomy site.  Musculoskeletal: Nontender with normal range of motion in all extremities. No edema, cyanosis, or erythema of extremities. Neurologic: Normal speech and language. Face is symmetric. Moving all extremities. No gross focal neurologic deficits are appreciated. Skin: Skin is warm, dry and intact. No rash noted. Psychiatric: Mood and affect are normal. Speech and behavior are normal.  ____________________________________________   LABS (all labs ordered are listed, but only abnormal results are displayed)  Labs Reviewed  CBC WITH DIFFERENTIAL/PLATELET - Abnormal; Notable for the following components:      Result Value   Hemoglobin 11.6 (*)    HCT 34.4 (*)    RDW 15.8 (*)    All other components within normal limits  COMPREHENSIVE METABOLIC PANEL - Abnormal; Notable for the following components:   Potassium 3.4 (*)    Albumin 3.4 (*)    All other components within normal limits  URINALYSIS, COMPLETE (UACMP) WITH MICROSCOPIC - Abnormal; Notable for the following components:   Color, Urine COLORLESS (*)    APPearance CLEAR (*)    All other components within normal limits  POCT PREGNANCY, URINE   ____________________________________________  EKG  none  ____________________________________________  RADIOLOGY  I have personally reviewed the images performed during this visit and I agree with the Radiologist's read.   Interpretation by Radiologist:  Ct Abdomen Pelvis W Contrast  Result Date: 01/29/2018 CLINICAL DATA:  Skin tear under as a colostomy bag. Left lower quadrant abdominal pain and  abdominal distention. EXAM: CT ABDOMEN AND PELVIS WITH CONTRAST TECHNIQUE: Multidetector CT imaging of the abdomen and pelvis was performed using the standard protocol following bolus administration of intravenous contrast. CONTRAST:  ISOVUE-300  IOPAMIDOL (ISOVUE-300) INJECTION 61% COMPARISON:  CT scan June 21, 2016 FINDINGS: Lower chest: No acute abnormality. Hepatobiliary: Hepatic steatosis is identified. Gallbladder is normal. Portal vein is patent. No other abnormalities. Pancreas: Unremarkable. No pancreatic ductal dilatation or surrounding inflammatory changes. Spleen: Normal in size without focal abnormality. Adrenals/Urinary Tract: Adrenal glands are unremarkable. Kidneys are normal, without renal calculi, focal lesion, or hydronephrosis. Bladder is unremarkable. Stomach/Bowel: The stomach is normal in appearance. 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 No bowel obstruction. There is an ostomy in the left lower quadrant consistent with partial colectomy. There are herniated loops of large and small bowel into the ostomy without evidence of high-grade obstruction. The patient peers to be status post appendectomy. There is moderate fecal loading in the colon from the cecum to the level of the ostomy. Vascular/Lymphatic: Atherosclerotic changes are seen in the nonaneurysmal aorta and iliac vessels. No adenopathy noted. Reproductive: Uterus and bilateral adnexa are unremarkable. Other: There is a fat containing ventral hernia. Musculoskeletal: No acute or significant osseous findings. IMPRESSION: 1. The patient has a left lower quadrant colostomy. There are loops of colon and small bowel herniated into the ostomy without high-grade obstruction. The herniated small bowel loops are similar in quantity in the interval. However, there is an increased amount of herniated colon. Moderate fecal loading in the colon proximal to the ostomy site. 2. Hepatic steatosis. 3. Atherosclerotic changes in the aorta. 4. Fat containing ventral hernia. Electronically Signed   By: Gerome Sam III M.D   On: 01/29/2018 16:32       ____________________________________________   PROCEDURES  Procedure(s) performed: None Procedures Critical Care performed:  None ____________________________________________   INITIAL IMPRESSION / ASSESSMENT AND PLAN / ED COURSE  52 y.o. female with a history of perforated diverticulitis status post resection with Hartmann procedure and colostomy since 2017, bipolar, depression, and migraines who presents for evaluation of abdominal pain and ulceration of the skin adjacent to the ostomy bag.  Patient is well-appearing, no distress, she has normal vital signs, her abdomen is distended with significant tenderness to palpation on the left lower quadrant surrounding the ostomy bag, patient has a large skin ulceration adjacent to the ostomy bag.  Will get CTA to evaluate for a parastomal hernia versus diverticulitis versus colitis. Will get surgical consult for the ulceration. Labs pending.   Clinical Course as of Jan 30 2020  Fri Jan 29, 2018  1944 Patient still waiting for surgeon to be evaluated. Dr. Maia Plan has been in the OR with a long case for most of the day and has not had a chance to see patient. CT showing small bowel and loops of colon seen inside the ventral hernia with no evidence of obstruction or incarceration. Plan per Dr. Maia Plan.    [CV]  2017 Patient evaluated by Dr. Maia Plan who recommended ostomy reversal as an outpatient.  He reports that the pressure from all the bowel pushing in the ventral hernia will  lead to worsening ulceration.  Patient has refused several times to have her ostomy takedown here and states that the reason is because she wants it to be done by different surgeon however she has not looked for another surgeon. I again reinforced the importance of  having her ostomy reversed either here or by another surgeon.  I will refer patient to the wound care center per Dr. Vedia Coffer recommendation.  Discussed return precautions for any signs or symptoms of sepsis or  SBO.   [CV]    Clinical Course User Index [CV] Don Perking [place], MD     As part of my medical decision making, I reviewed the following data within the electronic MEDICAL RECORD NUMBER Nursing notes reviewed and incorporated, Labs reviewed , Old chart reviewed, Radiograph reviewed , A consult was requested and obtained from this/these consultant(s) Surgery, Notes from prior ED visits and [place] Controlled Substance Database    Pertinent labs & imaging results that were available during my care of the patient were reviewed by me and considered in my medical decision making (see chart for details).    ____________________________________________   FINAL CLINICAL IMPRESSION(S) / ED DIAGNOSES  Final diagnoses:  Skin ulceration at colostomy site [facility])  Left lower quadrant pain      NEW MEDICATIONS STARTED DURING THIS VISIT:  ED Discharge Orders    None       Note:  This document was prepared using Dragon voice recognition software and may include unintentional dictation errors.    Don Perking, [place], MD 01/29/18 1947    Nita Sickle, MD 01/29/18 2021

## 2018-01-29 NOTE — Discharge Instructions (Addendum)
As we explained to you, you need to have your ostomy taken down.  You can follow-up with your surgeon Dr. Everlene FarrierPabon or [facility], or [facility]. Failing to do so may lead to worse ulceration, bowel obstruction, infection, and can also make you very sick. Also I am referring you to the wound clinic for management of of the ulcer next to the ostomy site. Make sure to call them on Monday for an appointment.

## 2018-01-29 NOTE — Consult Note (Signed)
SURGICAL CONSULTATION NOTE   HISTORY OF PRESENT ILLNESS (HPI):  52 y.o. female presented to [facility] ED for evaluation of pain on the colostomy area since months ago. Patient reports she has an opening on the colostomy area and has pain. Pain does not radiates. Pain is aggravated with pressure. Pain improved with narcotics. Denies nausea and vomiting. Denies fever or chills. Refers ostomy is working properly. Patient refers that the colostomy was done by Dr. Everlene Farrier. She accept that she has not been able to go back to her follow up with him. Patient also refers that she saw Dr. Katrinka Blazing due to the ulcer.  Surgery is consulted by Dr. Susette Racer in this context for evaluation and management of peri colostomy ulcer.  PAST MEDICAL HISTORY (PMH):  Past Medical History:  Diagnosis Date  . Anxiety   . Arthritis    neck  . Bipolar disorder (HCC)   . Chronic constipation    on Linzess, Miralax, Zofran  . Chronic insomnia 11/02/2015  . COPD, moderate (HCC)   . Depression   . Depression with anxiety    Followed by Dr. Lynett Fish, psychiatrist at [facility] and Wil[facility] Flavin for thrapy  . GERD (gastroesophageal reflux disease)   . Hepatitis    Hep C, history, has been treated and cured per pt.  . History of chicken pox   . History of cocaine use   . History of hiatal hernia   . History of pneumonia   . Hypothyroidism, adult    Working with Dr. Horton Chin, Endocrinology. S/P RAIU and is planning on biopsy  . Migraine without status migrainosus, not intractable   . Nicotine dependence, uncomplicated   . Nontoxic multinodular goiter    FNA done 10/30/14 with benign results. Seen [name]. Patient may want a second opinion.  . Rhinitis, allergic   . Vitamin D deficiency      PAST SURGICAL HISTORY (PSH):  Past Surgical History:  Procedure Laterality Date  . ANKLE FRACTURE SURGERY  1988   rods and pins in place  . APPENDECTOMY  04/17/2016   Procedure: APPENDECTOMY;  Surgeon:  Leafy Ro, MD;  Location: [facility] ORS;  Service: General;;  . CARPAL TUNNEL RELEASE Right 11/24/2016   Procedure: CARPAL TUNNEL RELEASE;  Surgeon: Deeann Saint, MD;  Location: [facility] ORS;  Service: Orthopedics;  Laterality: Right;  . CESAREAN SECTION  1996  . COLECTOMY WITH COLOSTOMY CREATION/HARTMANN PROCEDURE N/A 04/17/2016   Procedure: COLOSTOMY CREATION/HARTMANN PROCEDURE;  Surgeon: Leafy Ro, MD;  Location: [facility] ORS;  Service: General;  Laterality: N/A;  . ECTOPIC PREGNANCY SURGERY  2003  . LAPAROTOMY N/A 04/17/2016   Procedure: EXPLORATORY LAPAROTOMY;  Surgeon: Leafy Ro, MD;  Location: [facility] ORS;  Service: General;  Laterality: N/A;  . NECK SURGERY       MEDICATIONS:  Prior to Admission medications   Medication Sig Start Date End Date Taking? Authorizing Provider  acetaminophen (TYLENOL) 500 MG tablet Take 1,000 mg by mouth every 6 (six) hours as needed for moderate pain or headache.    [provider]  Aspirin-Salicylamide-Caffeine (BC HEADACHE PO) Take 1 packet by mouth 3 (three) times daily as needed (headache).    [provider]  azelastine (ASTELIN) 0.1 % nasal spray Place 1 spray into the nose 2 (two) times daily. Use in each nostril as directed     [provider]  BIOTIN PO Take 1 tablet by mouth 2 (two) times daily.    [provider]  Brexpiprazole (  REXULTI) 3 MG TABS Take 3 mg by mouth daily.    [provider]  budesonide-formoterol (SYMBICORT) 160-4.5 MCG/ACT inhaler Inhale 2 puffs into the lungs 2 (two) times daily. USE 2 PUFFS 2 TIMES A DAY Patient taking differently: Inhale 2 puffs into the lungs 2 (two) times daily.  11/02/15   Lada, Janit BernMelinda P, MD  butalbital-acetaminophen-caffeine (FIORICET, ESGIC) 50-325-40 MG tablet Take 1 tablet by mouth every 4 (four) hours as needed for headache.     [provider]  Calcium Carb-Cholecalciferol (CALCIUM 600+D3) 600-800 MG-UNIT TABS Take 1 tablet by mouth 2 (two) times  daily.    [provider]  calcium carbonate (TUMS - DOSED IN MG ELEMENTAL CALCIUM) 500 MG chewable tablet Chew 1-2 tablets by mouth 4 (four) times daily as needed for indigestion or heartburn.    [provider]  clonazePAM (KLONOPIN) 1 MG tablet Take 1 mg by mouth 3 (three) times daily.  02/09/14   [provider]  diltiazem (CARDIZEM CD) 180 MG 24 hr capsule Take 1 capsule (180 mg total) by mouth daily. 04/30/16   Henrene DodgePiscoya, Jose, MD  dimenhyDRINATE (DRAMAMINE) 50 MG tablet Take 50 mg by mouth 2 (two) times daily as needed for nausea or dizziness.     [provider]  EPIPEN 2-PAK 0.3 MG/0.3ML SOAJ injection Inject 0.3 mg into the muscle as needed (allergic reaction).  02/23/15   [provider]  fluticasone (FLONASE) 50 MCG/ACT nasal spray Place 1 spray into both nostrils 2 (two) times daily. 12/11/15   Kerman PasseyLada, Melinda P, MD  gabapentin (NEURONTIN) 400 MG capsule Take 400 mg by mouth 3 (three) times daily.  02/09/14   [provider]  HYDROcodone-acetaminophen (NORCO) 5-325 MG tablet Take 1 tablet by mouth every 6 (six) hours as needed. 11/24/16   [name], [name], MD  hydrOXYzine (VISTARIL) 50 MG capsule Take 50 mg by mouth 2 (two) times daily.    [provider]  lamoTRIgine (LAMICTAL) 150 MG tablet Take 300 mg by mouth daily.    [provider]  levocetirizine (XYZAL) 5 MG tablet Take 5 mg by mouth every evening. 06/12/16   [provider]  meloxicam (MOBIC) 15 MG tablet Take 1 tablet (15 mg total) by mouth daily. 11/24/16   [name], [name], MD  montelukast (SINGULAIR) 10 MG tablet TAKE 1 TABLET (10 MG TOTAL) BY MOUTH AT BEDTIME. 01/23/16   Lada, Janit BernMelinda P, MD  nortriptyline (PAMELOR) 50 MG capsule Take 100 mg by mouth at bedtime.     [provider]  ondansetron (ZOFRAN) 4 MG tablet Take 4 mg by mouth every 6 (six) hours as needed for nausea or vomiting.    [provider]  polyethylene glycol powder  (GLYCOLAX/MIRALAX) powder Take 17 g by mouth daily as needed for mild constipation. Patient taking differently: Take 17 g by mouth 2 (two) times daily.  04/17/16   Lada, Janit BernMelinda P, MD  prazosin (MINIPRESS) 5 MG capsule Take 5 mg by mouth at bedtime.    [provider]  promethazine (PHENERGAN) 25 MG tablet Take 25 mg by mouth every 6 (six) hours as needed for nausea or vomiting.    [provider]  ranitidine (ZANTAC) 150 MG tablet Take 150 mg by mouth daily.    [provider]  rOPINIRole (REQUIP) 2 MG tablet Take 2 mg by mouth 2 (two) times daily. 06/28/16   [provider]  sodium chloride (OCEAN) 0.65 % SOLN nasal spray Place 1 spray into both nostrils  2 (two) times daily as needed for congestion.    [provider]  Suvorexant (BELSOMRA) 20 MG TABS Take 20 mg by mouth at bedtime.    [provider]  tiZANidine (ZANAFLEX) 4 MG tablet Take 2 mg by mouth daily.    [provider]  Tolnaftate (ABSORBINE JR EX) Apply 1 application topically daily as needed (pain).    [provider]  traMADol (ULTRAM) 50 MG tablet Take 50 mg by mouth every 6 (six) hours as needed for pain. 11/14/16   [provider]  VENTOLIN HFA 108 (90 Base) MCG/ACT inhaler Inhale 2 puffs into the lungs every 4 (four) hours as needed for wheezing or shortness of breath. Patient taking differently: Inhale 2 puffs into the lungs every 6 (six) hours as needed for wheezing or shortness of breath.  04/18/16   Kerman Passey, MD     ALLERGIES:  No Known Allergies   SOCIAL HISTORY:  Social History   Socioeconomic History  . Marital status: Single    Spouse name: Not on file  . Number of children: Not on file  . Years of education: Not on file  . Highest education level: Not on file  Occupational History  . Not on file  Social Needs  . Financial resource strain: Not on file  . Food insecurity:    Worry: Not on file    Inability: Not on file   . Transportation needs:    Medical: Not on file    Non-medical: Not on file  Tobacco Use  . Smoking status: Current Every Day Smoker    Packs/day: 1.00    Years: 32.00    Pack years: 32.00    Types: Cigarettes  . Smokeless tobacco: Never Used  Substance and Sexual Activity  . Alcohol use: No    Alcohol/week: 0.0 oz  . Drug use: No    Comment: history of cocaine use, clean for 3.5 years   . Sexual activity: Never  Lifestyle  . Physical activity:    Days per week: Not on file    Minutes per session: Not on file  . Stress: Not on file  Relationships  . Social connections:    Talks on phone: Not on file    Gets together: Not on file    Attends religious service: Not on file    Active member of club or organization: Not on file    Attends meetings of clubs or organizations: Not on file    Relationship status: Not on file  . Intimate partner violence:    Fear of current or ex partner: Not on file    Emotionally abused: Not on file    Physically abused: Not on file    Forced sexual activity: Not on file  Other Topics Concern  . Not on file  Social History Narrative  . Not on file    The patient currently resides (home / rehab facility / nursing home): Home The patient normally is (ambulatory / bedbound): Ambulatory   FAMILY HISTORY:  Family History  Problem Relation Age of Onset  . Heart disease Father   . Alcohol abuse Father   . Diabetes Father   . Heart disease Brother   . Depression Brother   . Stroke Brother   . Alcohol abuse Paternal Grandfather   . Osteoporosis Mother   . Breast cancer Neg Hx      REVIEW OF SYSTEMS:  Constitutional: denies weight loss, fever, chills, or sweats  Eyes:  denies any other vision changes, history of eye injury  ENT: denies sore throat, hearing problems  Respiratory: denies shortness of breath, wheezing  Cardiovascular: denies chest pain, palpitations  Gastrointestinal: refers abdominal pain, Denies N/V,  Genitourinary:  denies burning with urination or urinary frequency Musculoskeletal: denies any other joint pains or cramps  Skin: denies any other rashes or skin discolorations  Neurological: denies any other headache, dizziness, weakness  Psychiatric: denies any other depression, anxiety   All other review of systems were negative   VITAL SIGNS:  Temp:  [98.1 F (36.7 C)-98.6 F (37 C)] 98.6 F (37 C) (07/26 2056) Pulse Rate:  [61-97] 78 (07/26 2056) Resp:  [9-16] 16 (07/26 2056) BP: (106-130)/(55-89) 130/89 (07/26 2056) SpO2:  [98 %-100 %] 100 % (07/26 2056) Weight:  [79.4 kg (175 lb)] 79.4 kg (175 lb) (07/26 1436)     Height: 5\' 9"  (175.3 cm) Weight: 79.4 kg (175 lb) BMI (Calculated): 25.83   INTAKE/OUTPUT:  This shift: No intake/output data recorded.  Last 2 shifts: @IOLAST2SHIFTS @   PHYSICAL EXAM:  Constitutional:  -- Normal body habitus  -- Awake, alert, and oriented x3  Eyes:  -- Pupils equally round and reactive to light  -- No scleral icterus  Ear, nose, and throat:  -- No jugular venous distension  Pulmonary:  -- No crackles  -- Equal breath sounds bilaterally -- Breathing non-labored at rest Cardiovascular:  -- S1, S2 present  -- No pericardial rubs Gastrointestinal:  -- Abdomen soft, nontender, non-distended, no guarding or rebound tenderness -- Left lower quadrant colostomy with an ulcer on the lateral aspect, no secretions, no necrotic tissue. Large parastomal hernia,  Musculoskeletal and Integumentary:  -- Wounds or skin discoloration: peri colostomy ulcer, not infected.  -- Extremities: B/L UE and LE FROM, hands and feet warm, no edema  Neurologic:  -- Motor function: intact and symmetric -- Sensation: intact and symmetric   Labs:  CBC Latest Ref Rng & Units 01/29/2018 11/19/2016 06/23/2016  WBC 3.6 - 11.0 K/uL 9.8 10.3 5.9  Hemoglobin 12.0 - 16.0 g/dL 11.6(L) 11.6(L) 8.8(L)  Hematocrit 35.0 - 47.0 % 34.4(L) 33.9(L) 26.2(L)  Platelets 150 - 440 K/uL 332 335 280    CMP Latest Ref Rng & Units 01/29/2018 06/24/2016 06/23/2016  Glucose 70 - 99 mg/dL 78 161(W) 87  BUN 6 - 20 mg/dL 8 <9(U) <0(A)  Creatinine 0.44 - 1.00 mg/dL 5.40 9.81 1.91  Sodium 135 - 145 mmol/L 139 137 137  Potassium 3.5 - 5.1 mmol/L 3.4(L) 2.9(L) 3.4(L)  Chloride 98 - 111 mmol/L 103 106 110  CO2 22 - 32 mmol/L 29 25 22   Calcium 8.9 - 10.3 mg/dL 9.3 4.7(W) 2.9(F)  Total Protein 6.5 - 8.1 g/dL 6.5 - -  Total Bilirubin 0.3 - 1.2 mg/dL 0.3 - -  Alkaline Phos 38 - 126 U/L 99 - -  AST 15 - 41 U/L 20 - -  ALT 0 - 44 U/L 12 - -    Imaging studies:  EXAM: CT ABDOMEN AND PELVIS WITH CONTRAST  TECHNIQUE: Multidetector CT imaging of the abdomen and pelvis was performed using the standard protocol following bolus administration of intravenous contrast.  CONTRAST:  ISOVUE-300 IOPAMIDOL (ISOVUE-300) INJECTION 61%  COMPARISON:  CT scan June 21, 2016  FINDINGS: Lower chest: No acute abnormality.  Hepatobiliary: Hepatic steatosis is identified. Gallbladder is normal. Portal vein is patent. No other abnormalities.  Pancreas: Unremarkable. No pancreatic ductal dilatation or surrounding inflammatory changes.  Spleen: Normal in size without focal  abnormality.  Adrenals/Urinary Tract: Adrenal glands are unremarkable. Kidneys are normal, without renal calculi, focal lesion, or hydronephrosis. Bladder is unremarkable.  Stomach/Bowel: The stomach is normal in appearance. 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 No bowel obstruction. There is an ostomy in the left lower quadrant consistent with partial colectomy. There are herniated loops of large and small bowel into the ostomy without evidence of high-grade obstruction. The patient peers to be status post appendectomy. There is moderate fecal loading in the colon from the cecum to the level of the ostomy.  Vascular/Lymphatic:  Atherosclerotic changes are seen in the nonaneurysmal aorta and iliac vessels. No adenopathy noted.  Reproductive: Uterus and bilateral adnexa are unremarkable.  Other: There is a fat containing ventral hernia.  Musculoskeletal: No acute or significant osseous findings.  IMPRESSION: 1. The patient has a left lower quadrant colostomy. There are loops of colon and small bowel herniated into the ostomy without high-grade obstruction. The herniated small bowel loops are similar in quantity in the interval. However, there is an increased amount of herniated colon. Moderate fecal loading in the colon proximal to the ostomy site. 2. Hepatic steatosis. 3. Atherosclerotic changes in the aorta. 4. Fat containing ventral hernia.   Electronically Signed   By: Gerome Sam III M.D   On: 01/29/2018 16:32  Assessment/Plan:  52 y.o. female with left lower quadrant colostomy peri ostomal ulcer and a large peri ostomal hernai. Patient with complicated case due to social issues. The patient does not has an urgent condition at this moment. The ulcer is not infected, there is not necrotic tissue. Chart was reviewed and patient has been seen by multiple surgeons and patient has not been compliant with follow ups and recommendations. Colostomy done by Dr. Everlene Farrier who explained a plan to the patient to be able to repair the hernia and take the colostomy down. Patient also evaluated by Dr. Katrinka Blazing who recommended to see surgeon at [facility]. Patient refers that she has not made any effort to look for a surgeon to repair the hernia electively. I personally discussed with patient that my recommendation is to look for a surgeon at a tertiary facility with all the services and resources because this surgery will be very complex and surgical ICU might be needed. I oriented the patient that the ulcer will be treated with the repair. Also discussed with ED physician to refer patient to a wound care clinic for local  care treatment while patient looks for the surgeon. Wound care clinic will help to find adequate ostomy appliances. There is no indication for surgical management or admission criteria at this moment.   Gae Gallop, MD

## 2018-01-29 NOTE — ED Notes (Signed)
Dr. Trisha Mangleiaz at bedside talking to pt, pt tearful  Reports burning sensation to ostomy area

## 2018-01-29 NOTE — ED Notes (Signed)
Pt's verbalizes understanding of discharge instructions

## 2018-02-07 ENCOUNTER — Other Ambulatory Visit: Payer: Self-pay

## 2018-02-07 ENCOUNTER — Emergency Department: Payer: 59

## 2018-02-07 ENCOUNTER — Emergency Department
Admission: EM | Admit: 2018-02-07 | Discharge: 2018-02-07 | Disposition: A | Discharge status: Home / Self Care | Payer: 59 | Attending: Emergency Medicine | Admitting: Emergency Medicine

## 2018-02-07 DIAGNOSIS — R1032 Left lower quadrant pain: Secondary | ICD-10-CM | POA: Diagnosis present

## 2018-02-07 DIAGNOSIS — K9409 Other complications of colostomy: Secondary | ICD-10-CM | POA: Insufficient documentation

## 2018-02-07 DIAGNOSIS — J449 Chronic obstructive pulmonary disease, unspecified: Secondary | ICD-10-CM | POA: Diagnosis not present

## 2018-02-07 DIAGNOSIS — F17210 Nicotine dependence, cigarettes, uncomplicated: Secondary | ICD-10-CM | POA: Insufficient documentation

## 2018-02-07 DIAGNOSIS — Z79899 Other long term (current) drug therapy: Secondary | ICD-10-CM | POA: Insufficient documentation

## 2018-02-07 DIAGNOSIS — L98491 Non-pressure chronic ulcer of skin of other sites limited to breakdown of skin: Secondary | ICD-10-CM

## 2018-02-07 DIAGNOSIS — E039 Hypothyroidism, unspecified: Secondary | ICD-10-CM | POA: Diagnosis not present

## 2018-02-07 LAB — CBC WITH DIFFERENTIAL/PLATELET
BASOS ABS: 0.1 10*3/uL (ref 0–0.1)
Basophils Relative: 1 %
Eosinophils Absolute: 0.5 10*3/uL (ref 0–0.7)
Eosinophils Relative: 6 %
HEMATOCRIT: 33.9 % — AB (ref 35.0–47.0)
HEMOGLOBIN: 11.7 g/dL — AB (ref 12.0–16.0)
LYMPHS PCT: 37 %
Lymphs Abs: 3.3 10*3/uL (ref 1.0–3.6)
MCH: 30.8 pg (ref 26.0–34.0)
MCHC: 34.6 g/dL (ref 32.0–36.0)
MCV: 89 fL (ref 80.0–100.0)
Monocytes Absolute: 0.7 10*3/uL (ref 0.2–0.9)
Monocytes Relative: 8 %
NEUTROS ABS: 4.5 10*3/uL (ref 1.4–6.5)
NEUTROS PCT: 48 %
Platelets: 336 10*3/uL (ref 150–440)
RBC: 3.81 MIL/uL (ref 3.80–5.20)
RDW: 16.4 % — ABNORMAL HIGH (ref 11.5–14.5)
WBC: 9.1 10*3/uL (ref 3.6–11.0)

## 2018-02-07 LAB — COMPREHENSIVE METABOLIC PANEL
ALT: 19 U/L (ref 0–44)
AST: 28 U/L (ref 15–41)
Albumin: 3.4 g/dL — ABNORMAL LOW (ref 3.5–5.0)
Alkaline Phosphatase: 114 U/L (ref 38–126)
Anion gap: 5 (ref 5–15)
BILIRUBIN TOTAL: 0.4 mg/dL (ref 0.3–1.2)
BUN: 9 mg/dL (ref 6–20)
CO2: 27 mmol/L (ref 22–32)
Calcium: 9.2 mg/dL (ref 8.9–10.3)
Chloride: 106 mmol/L (ref 98–111)
Creatinine, Ser: 0.92 mg/dL (ref 0.44–1.00)
GFR calc Af Amer: >60 mL/min (ref >60)
GFR calc non Af Amer: >60 mL/min (ref >60)
GLUCOSE: 89 mg/dL (ref 70–99)
Potassium: 3.6 mmol/L (ref 3.5–5.1)
Sodium: 138 mmol/L (ref 135–145)
Total Protein: 6.8 g/dL (ref 6.5–8.1)

## 2018-02-07 LAB — LIPASE, BLOOD: LIPASE: 29 U/L (ref 11–51)

## 2018-02-07 NOTE — ED Notes (Signed)
Pt given new ostomy wafer and informed to not cut it as large as she has been. States she has been cutting it at 2 inches. Informed to cut it smaller so that stool isn't sitting on red skin. Dr. Pershing ProudSchaevitz informed pt to call Dr. Randolm Idoloopers office tomorrow to see the ostomy nurse.

## 2018-02-07 NOTE — ED Notes (Signed)
Pt cursing in room about treatment and staff. Refusing to take DC papers with her. Refusing to sign for DC. Throwing objects around room.

## 2018-02-07 NOTE — ED Triage Notes (Addendum)
Pt arrives [facility] from home for problems with colostomy bag. Has had colostomy since 2017. Arrives to ED after 100mcg fentanyl. [facility]. States irritation around site and skin is "tearing". States pain at site. Colostomy is in LLQ. Pt main complaint is pain. Pt appears intoxicated after EMS fentanyl administration. Eyes appear heavy and pt is slurring words.

## 2018-02-07 NOTE — ED Provider Notes (Signed)
[facility] Emergency Department Provider Note ____________________________________________   First MD Initiated Contact with Patient 02/07/18 1600     (approximate)  I have reviewed the triage vital signs and the nursing notes.   HISTORY  Chief Complaint Post-op Problem (colostomy )  HPI Sabrina Espinoza is a 52 y.o. 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  This is the patient's third emergency department visit since 26 July.  She visited here on 26 July with left lower quadrant pain when there was found to be an increased burden of bowel loops herniating just below the ostomy.  However, no obstruction was detected.  Patient denies vomiting and there has been brown stool coming from the ostomy site.  Patient says that she has follow-up at [facility] on 22 August with surgery there for reversal of her colostomy.  She says that she does not want to have the reversal done here at [facility].  She was seen at [facility] on July 28 and she says that she was prescribed antibiotic cream for what appeared to be a dehiscence around the ostomy site.  However, she says that the dehiscence has been very painful and that is what is bringing her to the emergency department today.  She does not report any fever.  Past Medical History:  Diagnosis Date  . Anxiety   . Arthritis    neck  . Bipolar disorder (HCC)   . Chronic constipation    on Linzess, Miralax, Zofran  . Chronic insomnia 11/02/2015  . COPD, moderate (HCC)   . Depression   . Depression with anxiety    Followed by Dr. Lynett Fish, psychiatrist at [facility] and [facility] for thrapy  . GERD (gastroesophageal reflux disease)   . Hepatitis    Hep C, history, has been treated and cured per pt.  . History of chicken pox    . History of cocaine use   . History of hiatal hernia   . History of pneumonia   . Hypothyroidism, adult    Working with Dr. Horton Chin, Endocrinology. S/P RAIU and is planning on biopsy  . Migraine without status migrainosus, not intractable   . Nicotine dependence, uncomplicated   . Nontoxic multinodular goiter    FNA done 10/30/14 with benign results. Seen [name]. Patient may want a second opinion.  . Rhinitis, allergic   . Vitamin D deficiency     Patient Active Problem List   Diagnosis Date Noted  . Abdominal pain 06/21/2016  . Cellulitis of abdominal wall 06/21/2016  . UTI (urinary tract infection) 06/21/2016  . Sepsis (HCC) 06/21/2016  . Acute cystitis without hematuria   . Fever 06/20/2016  . Postoperative intra-abdominal abscess 06/13/2016  . Diverticulitis large intestine 04/17/2016  . Diverticulitis of colon with perforation   . Pallor 03/11/2016  . Tongue papillae atrophy 03/11/2016  . Encounter for medication monitoring 03/11/2016  . Peripheral neuropathy 03/11/2016  . Thrush 03/11/2016  . Primary insomnia 02/04/2016  . Right cervical radiculopathy 01/15/2016  . Chronic insomnia 11/02/2015  . Right knee pain 08/22/2015  . Midline low back pain without sciatica 05/07/2015  . Elevated blood pressure 04/20/2015  . Polypharmacy 03/22/2015  . Foot pain, right 03/16/2015  . Pain pharynx 03/08/2015  . GERD without esophagitis 03/08/2015  . Abnormal mammogram of  right breast 03/07/2015  . Degenerative disc disease, cervical 02/26/2015  . Bilateral leg cramps 02/26/2015  . Skin lesions, generalized 02/26/2015  . Allergic rhinitis with postnasal drip 01/31/2015  . Chronic sinusitis with recurrent bronchitis 01/31/2015  . Oral thrush 01/31/2015  . History of cocaine use 01/16/2015  . 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 Chronic hepatitis C without hepatic coma (HCC) 01/16/2015  . Chronic constipation 01/16/2015  . COPD, moderate (HCC) 01/16/2015  .  Cigarette smoker 01/16/2015  . Bipolar 1 disorder, mixed, partial remission (HCC) 01/16/2015  . Migraine without aura and without status migrainosus, not intractable 01/16/2015  . Depression with anxiety 01/16/2015  . Cephalalgia 03/06/2014  . Imbalance 03/06/2014  . Insomnia due to medical condition 03/06/2014  . Sleep disorder 03/06/2014    Past Surgical History:  Procedure Laterality Date  . ANKLE FRACTURE SURGERY  1988   rods and pins in place  . APPENDECTOMY  04/17/2016   Procedure: APPENDECTOMY;  Surgeon: Leafy Ro, MD;  Location: [facility] ORS;  Service: General;;  . CARPAL TUNNEL RELEASE Right 11/24/2016   Procedure: CARPAL TUNNEL RELEASE;  Surgeon: Deeann Saint, MD;  Location: [facility] ORS;  Service: Orthopedics;  Laterality: Right;  . CESAREAN SECTION  1996  . COLECTOMY WITH COLOSTOMY CREATION/HARTMANN PROCEDURE N/A 04/17/2016   Procedure: COLOSTOMY CREATION/HARTMANN PROCEDURE;  Surgeon: Leafy Ro, MD;  Location: [facility] ORS;  Service: General;  Laterality: N/A;  . ECTOPIC PREGNANCY SURGERY  2003  . LAPAROTOMY N/A 04/17/2016   Procedure: EXPLORATORY LAPAROTOMY;  Surgeon: Leafy Ro, MD;  Location: [facility] ORS;  Service: General;  Laterality: N/A;  . NECK SURGERY      Prior to Admission medications   Medication Sig Start Date End Date Taking? Authorizing Provider  acetaminophen (TYLENOL) 500 MG tablet Take 1,000 mg by mouth every 6 (six) hours as needed for moderate pain or headache.    [provider]  Aspirin-Salicylamide-Caffeine (BC HEADACHE PO) Take 1 packet by mouth 3 (three) times daily as needed (headache).    [provider]  azelastine (ASTELIN) 0.1 % nasal spray Place 1 spray into the nose 2 (two) times daily. Use in each nostril as directed     [provider]  BIOTIN PO Take 1 tablet by mouth 2 (two) times daily.    [provider]  Brexpiprazole (REXULTI) 3 MG TABS Take 3 mg by mouth daily.    [provider]    budesonide-formoterol (SYMBICORT) 160-4.5 MCG/ACT inhaler Inhale 2 puffs into the lungs 2 (two) times daily. USE 2 PUFFS 2 TIMES A DAY Patient taking differently: Inhale 2 puffs into the lungs 2 (two) times daily.  11/02/15   Lada, Janit Bern, MD  butalbital-acetaminophen-caffeine (FIORICET, ESGIC) 50-325-40 MG tablet Take 1 tablet by mouth every 4 (four) hours as needed for headache.     [provider]  Calcium Carb-Cholecalciferol (CALCIUM 600+D3) 600-800 MG-UNIT TABS Take 1 tablet by mouth 2 (two) times daily.    [provider]  calcium carbonate (TUMS - DOSED IN MG ELEMENTAL CALCIUM) 500 MG chewable tablet Chew 1-2 tablets by mouth 4 (four) times daily as needed for indigestion or heartburn.    [provider]  clonazePAM (KLONOPIN) 1 MG tablet Take 1 mg by mouth 3 (three) times daily.  02/09/14   [provider]  diltiazem (CARDIZEM CD) 180 MG 24 hr capsule Take 1 capsule (180 mg total) by mouth daily. 04/30/16   Henrene Dodge, MD  dimenhyDRINATE (DRAMAMINE) 50 MG tablet  Take 50 mg by mouth 2 (two) times daily as needed for nausea or dizziness.     [provider]  EPIPEN 2-PAK 0.3 MG/0.3ML SOAJ injection Inject 0.3 mg into the muscle as needed (allergic reaction).  02/23/15   [provider]  fluticasone (FLONASE) 50 MCG/ACT nasal spray Place 1 spray into both nostrils 2 (two) times daily. 12/11/15   Kerman PasseyLada, Melinda P, MD  gabapentin (NEURONTIN) 400 MG capsule Take 400 mg by mouth 3 (three) times daily.  02/09/14   [provider]  HYDROcodone-acetaminophen (NORCO) 5-325 MG tablet Take 1 tablet by mouth every 6 (six) hours as needed. 11/24/16   [name], [name], MD  hydrOXYzine (VISTARIL) 50 MG capsule Take 50 mg by mouth 2 (two) times daily.    [provider]  lamoTRIgine (LAMICTAL) 150 MG tablet Take 300 mg by mouth daily.    [provider]  levocetirizine (XYZAL) 5 MG tablet Take 5 mg by mouth every evening. 06/12/16    [provider]  meloxicam (MOBIC) 15 MG tablet Take 1 tablet (15 mg total) by mouth daily. 11/24/16   [name], [name], MD  montelukast (SINGULAIR) 10 MG tablet TAKE 1 TABLET (10 MG TOTAL) BY MOUTH AT BEDTIME. 01/23/16   Lada, Janit BernMelinda P, MD  nortriptyline (PAMELOR) 50 MG capsule Take 100 mg by mouth at bedtime.     [provider]  ondansetron (ZOFRAN) 4 MG tablet Take 4 mg by mouth every 6 (six) hours as needed for nausea or vomiting.    [provider]  polyethylene glycol powder (GLYCOLAX/MIRALAX) powder Take 17 g by mouth daily as needed for mild constipation. Patient taking differently: Take 17 g by mouth 2 (two) times daily.  04/17/16   Lada, Janit BernMelinda P, MD  prazosin (MINIPRESS) 5 MG capsule Take 5 mg by mouth at bedtime.    [provider]  promethazine (PHENERGAN) 25 MG tablet Take 25 mg by mouth every 6 (six) hours as needed for nausea or vomiting.    [provider]  ranitidine (ZANTAC) 150 MG tablet Take 150 mg by mouth daily.    [provider]  rOPINIRole (REQUIP) 2 MG tablet Take 2 mg by mouth 2 (two) times daily. 06/28/16   [provider]  sodium chloride (OCEAN) 0.65 % SOLN nasal spray Place 1 spray into both nostrils 2 (two) times daily as needed for congestion.    [provider]  Suvorexant (BELSOMRA) 20 MG TABS Take 20 mg by mouth at bedtime.    [provider]  tiZANidine (ZANAFLEX) 4 MG tablet Take 2 mg by mouth daily.    [provider]  Tolnaftate (ABSORBINE JR EX) Apply 1 application topically daily as needed (pain).    [provider]  traMADol (ULTRAM) 50 MG tablet Take 50 mg by mouth every 6 (six) hours as needed for pain. 11/14/16   [provider]  VENTOLIN HFA 108 (90 Base) MCG/ACT inhaler Inhale 2 puffs into the lungs every 4 (four) hours as needed for wheezing or shortness of breath. Patient taking differently: Inhale 2 puffs into the lungs every 6 (six) hours as  needed for wheezing or shortness of breath.  04/18/16   Kerman PasseyLada, Melinda P, MD    Allergies Patient has no known allergies.  Family History  Problem Relation Age of Onset  . Heart disease Father   . Alcohol abuse Father   . Diabetes Father   . Heart disease Brother   . Depression Brother   .  Stroke Brother   . Alcohol abuse Paternal Grandfather   . Osteoporosis Mother   . Breast cancer Neg Hx     Social History Social History   Tobacco Use  . Smoking status: Current Every Day Smoker    Packs/day: 1.00    Years: 32.00    Pack years: 32.00    Types: Cigarettes  . Smokeless tobacco: Never Used  Substance Use Topics  . Alcohol use: No    Alcohol/week: 0.0 oz  . Drug use: No    Comment: history of cocaine use, clean for 3.5 years     Review of Systems  Constitutional: No fever/chills Eyes: No visual changes. ENT: No sore throat. Cardiovascular: Denies chest pain. Respiratory: Denies shortness of breath. Gastrointestinal:  no vomiting.  No diarrhea.  No constipation. Genitourinary: Negative for dysuria. Musculoskeletal: Negative for back pain. Skin: Negative for rash. Neurological: Negative for headaches, focal weakness or numbness.   ____________________________________________   PHYSICAL EXAM:  VITAL SIGNS: ED Triage Vitals  Enc Vitals Group     BP 02/07/18 1602 (!) 129/97     Pulse Rate 02/07/18 1602 77     Resp 02/07/18 1602 18     Temp 02/07/18 1602 98 F (36.7 C)     Temp Source 02/07/18 1602 Oral     SpO2 02/07/18 1602 98 %     Weight 02/07/18 1601 175 lb (79.4 kg)     Height 02/07/18 1601 5\' 9"  (1.753 m)     Head Circumference --      Peak Flow --      Pain Score 02/07/18 1600 10     Pain Loc --      Pain Edu? --      Excl. in GC? --     Constitutional: Alert and oriented. Well appearing and in no acute distress. Eyes: Conjunctivae are normal.  Head: Atraumatic. Nose: No congestion/rhinnorhea. Mouth/Throat: Mucous membranes are moist.    Neck: No stridor.   Cardiovascular: Normal rate, regular rhythm. Grossly normal heart sounds.  Good peripheral circulation. Respiratory: Normal respiratory effort.  No retractions. Lungs CTAB. Gastrointestinal: Soft and nontender.  Left lower quadrant ostomy site withno distention.distention to the left lower quadrant but no tenderness to palpation.  Patient says that she just change the ostomy bag just prior to arrival and there is no stool in the bag at this time. Musculoskeletal: No lower extremity tenderness nor edema.  No joint effusions. Neurologic:  Normal speech and language. No gross focal neurologic deficits are appreciated. Skin: Ostomy removed and there is skin breakdown and approximately 2 cm wheal surrounding the bowel.  The ulceration is approximately 2 mm deep.  There is no exudate.  There is no surrounding erythema beyond the 2 cm wheal.  It is mildly tender to palpation without active bleeding. Psychiatric: Mood and affect are normal. Speech and behavior are normal.  ____________________________________________   LABS (all labs ordered are listed, but only abnormal results are displayed)  Labs Reviewed  CBC WITH DIFFERENTIAL/PLATELET - Abnormal; Notable for the following components:      Result Value   Hemoglobin 11.7 (*)    HCT 33.9 (*)    RDW 16.4 (*)    All other components within normal limits  COMPREHENSIVE METABOLIC PANEL - Abnormal; Notable for the following components:   Albumin 3.4 (*)    All other components within normal limits  LIPASE, BLOOD  URINALYSIS, COMPLETE (UACMP) WITH MICROSCOPIC   ____________________________________________  EKG   ____________________________________________  RADIOLOGY  Large peristomal hernia on the left.  No sign of bowel obstruction. ____________________________________________   PROCEDURES  Procedure(s) performed:   Procedures  Critical Care performed:    ____________________________________________   INITIAL IMPRESSION / ASSESSMENT AND PLAN / ED COURSE  Pertinent labs & imaging results that were available during my care of the patient were reviewed by me and considered in my medical decision making (see chart for details).  Differential diagnosis includes, but is not limited to, ovarian cyst, ovarian torsion, acute appendicitis, diverticulitis, urinary tract infection/pyelonephritis, endometriosis, bowel obstruction, colitis, renal colic, gastroenteritis, hernia, fibroids, endometriosis, pregnancy related pain including ectopic pregnancy, etc. wound dehiscence,  As part of my medical decision making, I reviewed the following data within the electronic MEDICAL RECORD NUMBER Notes from prior ED visits  ----------------------------------------- 6:01 PM on 02/07/2018 -----------------------------------------  I discussed the case with Dr. Excell Seltzer who recommends that the wafer be cut closer to the bowel and bacitracin placed beyond the way for so that the ulceration can begin to heal.  He also recommends that the patient follow-up with the ostomy nurse in his office.  Patient states that she has not been cutting her way for the exact same way since she was discharged from her original surgery.  She has an appointment to follow-up with [facility] surgery on the 22nd.  She will be calling Dr. Excell Seltzer.  She understands the diagnosis as well as treatment plan willing to comply.  Reassuring vital signs and reassuring lab work.  Patient not showing signs of sepsis. ____________________________________________   FINAL CLINICAL IMPRESSION(S) / ED DIAGNOSES  Skin ulceration around the ostomy site.  NEW MEDICATIONS STARTED DURING THIS VISIT:  New Prescriptions   No medications on file     Note:  This document was prepared using Dragon voice recognition software and may include unintentional dictation errors.     Myrna Blazer, MD 02/07/18  [phone]

## 2018-02-07 NOTE — ED Notes (Signed)
Pt taken to xray via stretcher  

## 2018-02-07 NOTE — ED Notes (Signed)
Dr. Pershing ProudSchaevitz at bedside to assess colostomy. Pt requesting pain medicine. Pt appears very sleepy to this RN. Pt voices that she is upset about not receiving pain medicine, states "this is ridiculous I should have stayed at home." Pt did not want Dr. Pershing ProudSchaevitz to remove colostomy, removing it herself. Redness noticed around bottom of colostomy. To L side of colostomy ulceration noted. Stool around colostomy site. Pt states she added something to help with smell that is green in color. Pt states "that's my large intestines coming out right there" and points to L side of colostomy. Pt repeating "it just hurts."

## 2018-02-08 ENCOUNTER — Telehealth: Payer: Self-pay | Admitting: General Practice

## 2018-02-08 NOTE — Telephone Encounter (Signed)
Spoke with the patient, patient stated [place] will be going to [facility].

## 2018-02-08 NOTE — Telephone Encounter (Signed)
-----   Message from Nicole KindredAngela K Brouillard sent at 02/08/2018  4:08 PM EDT ----- Regarding: ED referral Per ED note on 8/4, Dr Excell Seltzerooper want's patient to follow up in our office with the ostomy nurse. However, the ostomy nurse does not see patient's in our office anymore.   If the patient would like she can follow up with Dr Excell Seltzerooper in the office when he is available for Skin ulceration around the ostomy site. It will be an ED follow up. Not Post op

## 2018-02-25 ENCOUNTER — Other Ambulatory Visit: Payer: Self-pay | Admitting: Family Medicine

## 2018-02-25 ENCOUNTER — Ambulatory Visit
Admission: RE | Admit: 2018-02-25 | Discharge: 2018-02-25 | Disposition: A | Discharge status: Home / Self Care | Payer: 59 | Source: Ambulatory Visit | Attending: Family Medicine | Admitting: Family Medicine

## 2018-02-25 DIAGNOSIS — R52 Pain, unspecified: Secondary | ICD-10-CM | POA: Insufficient documentation

## 2018-04-12 DIAGNOSIS — F17200 Nicotine dependence, unspecified, uncomplicated: Secondary | ICD-10-CM | POA: Insufficient documentation

## 2018-04-12 DIAGNOSIS — F603 Borderline personality disorder: Secondary | ICD-10-CM | POA: Insufficient documentation

## 2018-05-02 ENCOUNTER — Emergency Department
Admission: EM | Admit: 2018-05-02 | Discharge: 2018-05-02 | Disposition: A | Discharge status: Home / Self Care | Payer: 59 | Attending: Emergency Medicine | Admitting: Emergency Medicine

## 2018-05-02 DIAGNOSIS — Z5321 Procedure and treatment not carried out due to patient leaving prior to being seen by health care provider: Secondary | ICD-10-CM | POA: Diagnosis not present

## 2018-05-02 DIAGNOSIS — R1030 Lower abdominal pain, unspecified: Secondary | ICD-10-CM | POA: Diagnosis not present

## 2018-05-02 LAB — CBC
HCT: 33.1 % — ABNORMAL LOW (ref 36.0–46.0)
Hemoglobin: 10.6 g/dL — ABNORMAL LOW (ref 12.0–15.0)
MCH: 29.4 pg (ref 26.0–34.0)
MCHC: 32 g/dL (ref 30.0–36.0)
MCV: 91.9 fL (ref 80.0–100.0)
Platelets: 334 K/uL (ref 150–400)
RBC: 3.6 MIL/uL — ABNORMAL LOW (ref 3.87–5.11)
RDW: 14.6 % (ref 11.5–15.5)
WBC: 9.2 K/uL (ref 4.0–10.5)
nRBC: 0 % (ref 0.0–0.2)

## 2018-05-02 LAB — LIPASE, BLOOD: LIPASE: 33 U/L (ref 11–51)

## 2018-05-02 LAB — COMPREHENSIVE METABOLIC PANEL WITH GFR
ALT: 19 U/L (ref 0–44)
AST: 29 U/L (ref 15–41)
Albumin: 3.1 g/dL — ABNORMAL LOW (ref 3.5–5.0)
Alkaline Phosphatase: 131 U/L — ABNORMAL HIGH (ref 38–126)
Anion gap: 11 (ref 5–15)
BUN: <5 mg/dL — ABNORMAL LOW (ref 6–20)
CO2: 25 mmol/L (ref 22–32)
Calcium: 9.7 mg/dL (ref 8.9–10.3)
Chloride: 102 mmol/L (ref 98–111)
Creatinine, Ser: 1 mg/dL (ref 0.44–1.00)
GFR calc Af Amer: >60 mL/min
GFR calc non Af Amer: >60 mL/min
Glucose, Bld: 99 mg/dL (ref 70–99)
Potassium: 3.7 mmol/L (ref 3.5–5.1)
Sodium: 138 mmol/L (ref 135–145)
Total Bilirubin: 0.3 mg/dL (ref 0.3–1.2)
Total Protein: 7.2 g/dL (ref 6.5–8.1)

## 2018-05-02 NOTE — ED Triage Notes (Signed)
Pt arrives to ED [facility]. C/o low abd pain. Reversal of colostomy 10/1. Started on antibiotics 10/13. Lower abd pain x 3 days. No fever per EMS. VSS with EMS. Denies any redness around site. Had procedure done at [facility]. She stated she called them and they told her to come to ED for pain control.

## 2018-05-02 NOTE — ED Notes (Signed)
Pt called to room x 3 without answer.  

## 2018-06-22 DIAGNOSIS — F54 Psychological and behavioral factors associated with disorders or diseases classified elsewhere: Secondary | ICD-10-CM | POA: Insufficient documentation

## 2018-07-15 ENCOUNTER — Emergency Department
Admission: EM | Admit: 2018-07-15 | Discharge: 2018-07-15 | Disposition: A | Discharge status: Home / Self Care | Payer: 59 | Attending: Emergency Medicine | Admitting: Emergency Medicine

## 2018-07-15 ENCOUNTER — Encounter: Payer: Self-pay | Admitting: Emergency Medicine

## 2018-07-15 DIAGNOSIS — J449 Chronic obstructive pulmonary disease, unspecified: Secondary | ICD-10-CM | POA: Insufficient documentation

## 2018-07-15 DIAGNOSIS — G8929 Other chronic pain: Secondary | ICD-10-CM | POA: Diagnosis not present

## 2018-07-15 DIAGNOSIS — S3991XD Unspecified injury of abdomen, subsequent encounter: Secondary | ICD-10-CM | POA: Diagnosis present

## 2018-07-15 DIAGNOSIS — F419 Anxiety disorder, unspecified: Secondary | ICD-10-CM | POA: Diagnosis not present

## 2018-07-15 DIAGNOSIS — X58XXXA Exposure to other specified factors, initial encounter: Secondary | ICD-10-CM | POA: Diagnosis not present

## 2018-07-15 DIAGNOSIS — F319 Bipolar disorder, unspecified: Secondary | ICD-10-CM | POA: Insufficient documentation

## 2018-07-15 DIAGNOSIS — Z79899 Other long term (current) drug therapy: Secondary | ICD-10-CM | POA: Insufficient documentation

## 2018-07-15 DIAGNOSIS — F17210 Nicotine dependence, cigarettes, uncomplicated: Secondary | ICD-10-CM | POA: Insufficient documentation

## 2018-07-15 DIAGNOSIS — F1490 Cocaine use, unspecified, uncomplicated: Secondary | ICD-10-CM | POA: Insufficient documentation

## 2018-07-15 DIAGNOSIS — E039 Hypothyroidism, unspecified: Secondary | ICD-10-CM | POA: Diagnosis not present

## 2018-07-15 DIAGNOSIS — S31109D Unspecified open wound of abdominal wall, unspecified quadrant without penetration into peritoneal cavity, subsequent encounter: Secondary | ICD-10-CM | POA: Insufficient documentation

## 2018-07-15 MED ORDER — OXYCODONE-ACETAMINOPHEN 5-325 MG PO TABS
2.0000 | ORAL_TABLET | Freq: Once | ORAL | Status: AC
  Administered 2018-07-15: 2 via ORAL
  Filled 2018-07-15: qty 2

## 2018-07-15 NOTE — ED Provider Notes (Addendum)
[facility] Emergency Department Provider Note  ____________________________________________   First MD Initiated Contact with Patient 07/15/18 1757     (approximate)  I have reviewed the triage vital signs and the nursing notes.   HISTORY  Chief Complaint Medication Refill    HPI Sabrina Espinoza is a 53 y.o. female with a history of bipolar disorder as well as postoperative intra-abdominal abscess with delayed healing requiring wound VAC was presented to the emergency department today complaining of pain and requesting pain medication.  The patient states that she was given 10 to 20 mg every 4 hours of oxycodone on December 31st.  Says that she was taking it every 4-6 hours and ran out this morning is now complaining of worsening pain.  She states that she was at her surgeon's office this morning at [facility] but "for a different reason."  She states that she was offered by the nurse practitioner there to have the wound VAC converted to wet-to-dry dressings but she refused and walked out of the office.  Patient denies fever or chills.  States that the pain is a 10 out of 10.  Denies any worsening redness or pus around the wound VAC.    Past Medical History:  Diagnosis Date  . Anxiety   . Arthritis    neck  . Bipolar disorder (HCC)   . Chronic constipation    on Linzess, Miralax, Zofran  . Chronic insomnia 11/02/2015  . COPD, moderate (HCC)   . Depression   . Depression with anxiety    Followed by Dr. Lynett Fish, psychiatrist at [facility] and Wilma Flavin for thrapy  . GERD (gastroesophageal reflux disease)   . Hepatitis    Hep C, history, has been treated and cured per pt.  . History of chicken pox   . History of cocaine use   . History of hiatal hernia   . History of pneumonia   . Hypothyroidism, adult    Working with Dr. Horton Chin, Endocrinology. S/P RAIU and is planning on biopsy  . Migraine without status migrainosus, not  intractable   . Nicotine dependence, uncomplicated   . Nontoxic multinodular goiter    FNA done 10/30/14 with benign results. Seen [name]. Patient may want a second opinion.  . Rhinitis, allergic   . Vitamin D deficiency     Patient Active Problem List   Diagnosis Date Noted  . Abdominal pain 06/21/2016  . Cellulitis of abdominal wall 06/21/2016  . UTI (urinary tract infection) 06/21/2016  . Sepsis (HCC) 06/21/2016  . Acute cystitis without hematuria   . Fever 06/20/2016  . Postoperative intra-abdominal abscess 06/13/2016  . Diverticulitis large intestine 04/17/2016  . Diverticulitis of colon with perforation   . Pallor 03/11/2016  . Tongue papillae atrophy 03/11/2016  . Encounter for medication monitoring 03/11/2016  . Peripheral neuropathy 03/11/2016  . Thrush 03/11/2016  . Primary insomnia 02/04/2016  . Right cervical radiculopathy 01/15/2016  . Chronic insomnia 11/02/2015  . Right knee pain 08/22/2015  . Midline low back pain without sciatica 05/07/2015  . Elevated blood pressure 04/20/2015  . Polypharmacy 03/22/2015  . Foot pain, right 03/16/2015  . Pain pharynx 03/08/2015  . GERD without esophagitis 03/08/2015  . Abnormal mammogram of right breast 03/07/2015  . Degenerative disc disease, cervical 02/26/2015  . Bilateral leg cramps 02/26/2015  . Skin lesions, generalized 02/26/2015  . Allergic rhinitis with postnasal drip 01/31/2015  . Chronic sinusitis with recurrent bronchitis 01/31/2015  . Oral thrush  01/31/2015  . History of cocaine use 01/16/2015  . 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 Chronic hepatitis C without hepatic coma (HCC) 01/16/2015  . Chronic constipation 01/16/2015  . COPD, moderate (HCC) 01/16/2015  . Cigarette smoker 01/16/2015  . Bipolar 1 disorder, mixed, partial remission (HCC) 01/16/2015  . Migraine without aura and without status migrainosus, not intractable 01/16/2015  . Depression with anxiety 01/16/2015  . Cephalalgia 03/06/2014   . Imbalance 03/06/2014  . Insomnia due to medical condition 03/06/2014  . Sleep disorder 03/06/2014    Past Surgical History:  Procedure Laterality Date  . ANKLE FRACTURE SURGERY  1988   rods and pins in place  . APPENDECTOMY  04/17/2016   Procedure: APPENDECTOMY;  Surgeon: [name], MD;  Location: [facility] ORS;  Service: General;;  . CARPAL TUNNEL RELEASE Right 11/24/2016   Procedure: CARPAL TUNNEL RELEASE;  Surgeon: [name], [name], MD;  Location: [facility] ORS;  Service: Orthopedics;  Laterality: Right;  . CESAREAN SECTION  1996  . COLECTOMY WITH COLOSTOMY CREATION/HARTMANN PROCEDURE N/A 04/17/2016   Procedure: COLOSTOMY CREATION/HARTMANN PROCEDURE;  Surgeon: [name], MD;  Location: [facility] ORS;  Service: General;  Laterality: N/A;  . ECTOPIC PREGNANCY SURGERY  2003  . LAPAROTOMY N/A 04/17/2016   Procedure: EXPLORATORY LAPAROTOMY;  Surgeon: [name], MD;  Location: [facility] ORS;  Service: General;  Laterality: N/A;  . NECK SURGERY      Prior to Admission medications   Medication Sig Start Date End Date Taking? Authorizing Provider  acetaminophen (TYLENOL) 500 MG tablet Take 1,000 mg by mouth every 6 (six) hours as needed for moderate pain or headache.    [provider]  Aspirin-Salicylamide-Caffeine (BC HEADACHE PO) Take 1 packet by mouth 3 (three) times daily as needed (headache).    [provider]  azelastine (ASTELIN) 0.1 % nasal spray Place 1 spray into the nose 2 (two) times daily. Use in each nostril as directed     [provider]  BIOTIN PO Take 1 tablet by mouth 2 (two) times daily.    [provider]  Brexpiprazole (REXULTI) 3 MG TABS Take 3 mg by mouth daily.    [provider]  budesonide-formoterol (SYMBICORT) 160-4.5 MCG/ACT inhaler Inhale 2 puffs into the lungs 2 (two) times daily. USE 2 PUFFS 2 TIMES A DAY Patient taking differently: Inhale 2 puffs into the lungs 2 (two) times daily.  11/02/15   Lada, Janit BernMelinda P, MD    butalbital-acetaminophen-caffeine (FIORICET, ESGIC) 50-325-40 MG tablet Take 1 tablet by mouth every 4 (four) hours as needed for headache.     [provider]  Calcium Carb-Cholecalciferol (CALCIUM 600+D3) 600-800 MG-UNIT TABS Take 1 tablet by mouth 2 (two) times daily.    [provider]  calcium carbonate (TUMS - DOSED IN MG ELEMENTAL CALCIUM) 500 MG chewable tablet Chew 1-2 tablets by mouth 4 (four) times daily as needed for indigestion or heartburn.    [provider]  clonazePAM (KLONOPIN) 1 MG tablet Take 1 mg by mouth 3 (three) times daily.  02/09/14   [provider]  diltiazem (CARDIZEM CD) 180 MG 24 hr capsule Take 1 capsule (180 mg total) by mouth daily. 04/30/16   Henrene DodgePiscoya, Jose, MD  dimenhyDRINATE (DRAMAMINE) 50 MG tablet Take 50 mg by mouth 2 (two) times daily as needed for nausea or dizziness.     [provider]  EPIPEN 2-PAK 0.3 MG/0.3ML SOAJ injection Inject 0.3 mg into the muscle as needed (allergic reaction).  02/23/15  [provider]  fluticasone (FLONASE) 50 MCG/ACT nasal spray Place 1 spray into both nostrils 2 (two) times daily. 12/11/15   Kerman Passey, MD  gabapentin (NEURONTIN) 400 MG capsule Take 400 mg by mouth 3 (three) times daily.  02/09/14   [provider]  HYDROcodone-acetaminophen (NORCO) 5-325 MG tablet Take 1 tablet by mouth every 6 (six) hours as needed. 11/24/16   Deeann Saint, MD  hydrOXYzine (VISTARIL) 50 MG capsule Take 50 mg by mouth 2 (two) times daily.    [provider]  lamoTRIgine (LAMICTAL) 150 MG tablet Take 300 mg by mouth daily.    [provider]  levocetirizine (XYZAL) 5 MG tablet Take 5 mg by mouth every evening. 06/12/16   [provider]  meloxicam (MOBIC) 15 MG tablet Take 1 tablet (15 mg total) by mouth daily. 11/24/16   Deeann Saint, MD  montelukast (SINGULAIR) 10 MG tablet TAKE 1 TABLET (10 MG TOTAL) BY MOUTH AT BEDTIME. 01/23/16   Lada, Janit Bern, MD   nortriptyline (PAMELOR) 50 MG capsule Take 100 mg by mouth at bedtime.     [provider]  ondansetron (ZOFRAN) 4 MG tablet Take 4 mg by mouth every 6 (six) hours as needed for nausea or vomiting.    [provider]  polyethylene glycol powder (GLYCOLAX/MIRALAX) powder Take 17 g by mouth daily as needed for mild constipation. Patient taking differently: Take 17 g by mouth 2 (two) times daily.  04/17/16   Lada, Janit Bern, MD  prazosin (MINIPRESS) 5 MG capsule Take 5 mg by mouth at bedtime.    [provider]  promethazine (PHENERGAN) 25 MG tablet Take 25 mg by mouth every 6 (six) hours as needed for nausea or vomiting.    [provider]  ranitidine (ZANTAC) 150 MG tablet Take 150 mg by mouth daily.    [provider]  rOPINIRole (REQUIP) 2 MG tablet Take 2 mg by mouth 2 (two) times daily. 06/28/16   [provider]  sodium chloride (OCEAN) 0.65 % SOLN nasal spray Place 1 spray into both nostrils 2 (two) times daily as needed for congestion.    [provider]  Suvorexant (BELSOMRA) 20 MG TABS Take 20 mg by mouth at bedtime.    [provider]  tiZANidine (ZANAFLEX) 4 MG tablet Take 2 mg by mouth daily.    [provider]  Tolnaftate (ABSORBINE JR EX) Apply 1 application topically daily as needed (pain).    [provider]  traMADol (ULTRAM) 50 MG tablet Take 50 mg by mouth every 6 (six) hours as needed for pain. 11/14/16   [provider]  VENTOLIN HFA 108 (90 Base) MCG/ACT inhaler Inhale 2 puffs into the lungs every 4 (four) hours as needed for wheezing or shortness of breath. Patient taking differently: Inhale 2 puffs into the lungs every 6 (six) hours as needed for wheezing or shortness of breath.  04/18/16   Kerman Passey, MD    Allergies Patient has no known allergies.  Family History  Problem Relation Age of Onset  . Heart disease Father   . Alcohol abuse Father   . Diabetes Father    . Heart disease Brother   . Depression Brother   . Stroke Brother   . Alcohol abuse Paternal Grandfather   . Osteoporosis Mother   . Breast cancer Neg Hx     Social History Social History   Tobacco Use  . Smoking status: Current Every Day Smoker  Packs/day: 1.00    Years: 32.00    Pack years: 32.00    Types: Cigarettes  . Smokeless tobacco: Never Used  Substance Use Topics  . Alcohol use: No    Alcohol/week: 0.0 standard drinks  . Drug use: No    Comment: history of cocaine use, clean for 3.5 years     Review of Systems  Constitutional: No fever/chills Eyes: No visual changes. ENT: No sore throat. Cardiovascular: Denies chest pain. Respiratory: Denies shortness of breath. Gastrointestinal: No abdominal pain.  No nausea, no vomiting.  No diarrhea.  No constipation. Genitourinary: Negative for dysuria. Musculoskeletal: Negative for back pain. Skin: Wound VAC to the anterior abdominal wall. Neurological: Negative for headaches, focal weakness or numbness.   ____________________________________________   PHYSICAL EXAM:  VITAL SIGNS: ED Triage Vitals  Enc Vitals Group     BP 07/15/18 1707 (!) 127/92     Pulse Rate 07/15/18 1707 (!) 109     Resp 07/15/18 1707 16     Temp 07/15/18 1707 98.6 F (37 C)     Temp Source 07/15/18 1707 Oral     SpO2 07/15/18 1707 100 %     Weight 07/15/18 1707 175 lb (79.4 kg)     Height 07/15/18 1707 5' 8.5" (1.74 m)     Head Circumference --      Peak Flow --      Pain Score 07/15/18 1726 10     Pain Loc --      Pain Edu? --      Excl. in GC? --     Constitutional: Alert and oriented. Well appearing and in no acute distress. Eyes: Conjunctivae are normal.  Head: Atraumatic. Nose: No congestion/rhinnorhea. Mouth/Throat: Mucous membranes are moist.  Neck: No stridor.   Cardiovascular: Normal rate, regular rhythm. Grossly normal heart sounds.  Respiratory: Normal respiratory effort.  No retractions. Lungs  CTAB. Gastrointestinal: Soft and with very mild tenderness to palpation around the site of the anterior wound VAC.  Wound VAC is in place anteriorly and is approximately 3 x 8 cm.  No surrounding erythema or induration.  There is no exudate expressed.  Wound VAC appears clean and in place and without any bleeding.  No distention. Musculoskeletal: No lower extremity tenderness nor edema.  No joint effusions. Neurologic:  Normal speech and language. No gross focal neurologic deficits are appreciated. Skin:  Skin is warm, dry and intact. No rash noted. Psychiatric: Mood and affect are normal. Speech and behavior are normal.  ____________________________________________   LABS (all labs ordered are listed, but only abnormal results are displayed)  Labs Reviewed - No data to display ____________________________________________  EKG   ____________________________________________  RADIOLOGY   ____________________________________________   PROCEDURES  Procedure(s) performed:   Procedures  Critical Care performed:   ____________________________________________   INITIAL IMPRESSION / ASSESSMENT AND PLAN / ED COURSE  Pertinent labs & imaging results that were available during my care of the patient were reviewed by me and considered in my medical decision making (see chart for details).  DDX: Chronic pain, cellulitis, deep space infection, abscess As part of my medical decision making, I reviewed the following data within the electronic MEDICAL RECORD NUMBERNote from surgery clinic from earlier today and [place] Controlled Substance Database  It appears that the patient had requested pain medications earlier in the day today when she saw the nurse practitioner at her surgeon.  She was offered a wound VAC change instead and the patient then walked out.  I  discussed with the patient that we would not be able to give her prescription here in the emergency department and that we were very strict  policy for not dispensing prescriptions for chronic pain.  We will be able to give her dose of oxycodone here in the emergency department.  However, she is understanding of the pain policy and I will not be sending her home with a prescription.  Patient's heart rate at the bedside is 84 bpm.  She is nontoxic in appearance.  She will follow-up with her surgeon at [facility].  She is understanding the diagnosis well treatment and willing to comply.  Furthermore, I reviewed the patient's controlled substance database and it is true that she had 45 oxycodone, 10 mg dispensed on 31 December.  Also 91 mg clonazepam on October 16.  Ten 5 mg oxycodones on 11/23.  ____________________________________________   FINAL CLINICAL IMPRESSION(S) / ED DIAGNOSES  Chronic wound to the anterior abdomen.  Chronic pain.  NEW MEDICATIONS STARTED DURING THIS VISIT:  New Prescriptions   No medications on file     Note:  This document was prepared using Dragon voice recognition software and may include unintentional dictation errors.     Myrna Blazer, MD 07/15/18 Bennie Hind    Myrna Blazer, MD 07/15/18 [id number]

## 2018-07-15 NOTE — ED Triage Notes (Addendum)
Patient presents to the ED with abdominal pain around where she has a wound with a wound vac.  Patient states she was given 45 pain pills when she left the hospital and when her pain was not controlled she was told to double up. Patient states she took her last pain pill early this morning.  Patient is tearful during triage.  Patient states, "now I have nothing, and it's really terrible."

## 2018-07-15 NOTE — ED Notes (Signed)
Pt c/o pain in abdomen that is "dull" "hot" "itchy" "sharp" Pt states she ran out of pain medication this morning. She has been taking 10mg  percocet that, to this RN's understanding, was prescribed 12/31. Pt states that in hospital during last stay she was getting 40 mg every 4 hours.  Pt went to [facility] today and when speaking about it became very upset and loud that her doctor would not give her more pain medication. Pt states she has not been able to walk d/t pain.

## 2018-07-18 ENCOUNTER — Other Ambulatory Visit: Payer: Self-pay

## 2018-07-18 ENCOUNTER — Emergency Department: Payer: 59

## 2018-07-18 ENCOUNTER — Emergency Department
Admission: EM | Admit: 2018-07-18 | Discharge: 2018-07-18 | Disposition: A | Discharge status: Home / Self Care | Payer: 59 | Attending: Emergency Medicine | Admitting: Emergency Medicine

## 2018-07-18 DIAGNOSIS — G8929 Other chronic pain: Secondary | ICD-10-CM | POA: Diagnosis not present

## 2018-07-18 DIAGNOSIS — F17210 Nicotine dependence, cigarettes, uncomplicated: Secondary | ICD-10-CM | POA: Insufficient documentation

## 2018-07-18 DIAGNOSIS — Z79899 Other long term (current) drug therapy: Secondary | ICD-10-CM | POA: Insufficient documentation

## 2018-07-18 DIAGNOSIS — M79672 Pain in left foot: Secondary | ICD-10-CM | POA: Diagnosis present

## 2018-07-18 DIAGNOSIS — J449 Chronic obstructive pulmonary disease, unspecified: Secondary | ICD-10-CM | POA: Diagnosis not present

## 2018-07-18 DIAGNOSIS — M79609 Pain in unspecified limb: Secondary | ICD-10-CM

## 2018-07-18 DIAGNOSIS — E039 Hypothyroidism, unspecified: Secondary | ICD-10-CM | POA: Diagnosis not present

## 2018-07-18 NOTE — ED Triage Notes (Signed)
Pt comes from home via [facility] for abdominal and left foot pain. Pt had abdominal surgery in November. Pt broke her foot when she was 20 and pt states it is now "bothering her." Pt has gauze packing in surgical site. Pt had a wound vac but she removed it yesterday.

## 2018-07-18 NOTE — ED Notes (Signed)
Taxi service called at this time for pt transportation.

## 2018-07-18 NOTE — ED Provider Notes (Signed)
[facility] Emergency Department Provider Note  ____________________________________________   I have reviewed the triage vital signs and the nursing notes. Where available I have reviewed prior notes and, if possible and indicated, outside hospital notes.    HISTORY  Chief Complaint Abdominal Pain and Foot Pain    HPI Sabrina Espinoza is a 53 y.o. female with a history of bipolar disorder, chronic insomnia, depression with anxiety, history of using Suboxone, history of drug-seeking behavior unfortunately had a subcutaneous abscess which was being treated with a wound VAC.  No longer has the wound VAC on.  Patient has been here and at her surgeons and trying to get narcotic pain medication.  Please see her note in care everywhere from her surgeon, where he describes how she barged out of his office when he refused to give her narcotics.  Patient states that her ankle always hurts and her stomach always hurts and she is not getting enough pain meds from the people who generally manage it that is why she is in the emergency room.  Yesterday, she was given 2 Percocets which she felt to be an insufficient quantity.  She has not yet seen her PCP for this problem.  She states that she called 911 in the middle the night to have [facility] but they did not do so so she refused to go anywhere and then this morning she called 911 to come here to see if we can get her something for her chronic pain.  Denies any fever or chills nausea vomiting or diarrhea.  Patient is very tangential in her history in some ways.  She is also quite sleepy.  I have to refocus her attention on her history several times.  She states she took her Geodon and other medications "late" last night and she was also up all night arguing with EMS she states.    Past Medical History:  Diagnosis Date  . Anxiety   . Arthritis    neck  . Bipolar disorder (HCC)   . Chronic constipation    on  Linzess, Miralax, Zofran  . Chronic insomnia 11/02/2015  . COPD, moderate (HCC)   . Depression   . Depression with anxiety    Followed [facility] [name], [facility] and [name] for thrapy  . GERD (gastroesophageal reflux disease)   . Hepatitis    Hep C, history, has been treated and cured per pt.  . History of chicken pox   . History of cocaine use   . History of hiatal hernia   . History of pneumonia   . Hypothyroidism, adult    Working with Dr. Horton ChinShamil [name], Endocrinology. S/P RAIU and is planning on biopsy  . Migraine without status migrainosus, not intractable   . Nicotine dependence, uncomplicated   . Nontoxic multinodular goiter    FNA done 10/30/14 with benign results. Seen [name]. Patient may want a second opinion.  . Rhinitis, allergic   . Vitamin D deficiency     Patient Active Problem List   Diagnosis Date Noted  . Abdominal pain 06/21/2016  . Cellulitis of abdominal wall 06/21/2016  . UTI (urinary tract infection) 06/21/2016  . Sepsis (HCC) 06/21/2016  . Acute cystitis without hematuria   . Fever 06/20/2016  . Postoperative intra-abdominal abscess 06/13/2016  . Diverticulitis large intestine 04/17/2016  . Diverticulitis of colon with perforation   . Pallor 03/11/2016  . Tongue papillae atrophy 03/11/2016  . Encounter  for medication monitoring 03/11/2016  . Peripheral neuropathy 03/11/2016  . Thrush 03/11/2016  . Primary insomnia 02/04/2016  . Right cervical radiculopathy 01/15/2016  . Chronic insomnia 11/02/2015  . Right knee pain 08/22/2015  . Midline low back pain without sciatica 05/07/2015  . Elevated blood pressure 04/20/2015  . Polypharmacy 03/22/2015  . Foot pain, right 03/16/2015  . Pain pharynx 03/08/2015  . GERD without esophagitis 03/08/2015  . Abnormal mammogram of right breast 03/07/2015  . Degenerative disc disease, cervical 02/26/2015  . Bilateral leg cramps 02/26/2015  . Skin lesions, generalized  02/26/2015  . Allergic rhinitis with postnasal drip 01/31/2015  . Chronic sinusitis with recurrent bronchitis 01/31/2015  . Oral thrush 01/31/2015  . History of cocaine use 01/16/2015  . 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 Chronic hepatitis C without hepatic coma (HCC) 01/16/2015  . Chronic constipation 01/16/2015  . COPD, moderate (HCC) 01/16/2015  . Cigarette smoker 01/16/2015  . Bipolar 1 disorder, mixed, partial remission (HCC) 01/16/2015  . Migraine without aura and without status migrainosus, not intractable 01/16/2015  . Depression with anxiety 01/16/2015  . Cephalalgia 03/06/2014  . Imbalance 03/06/2014  . Insomnia due to medical condition 03/06/2014  . Sleep disorder 03/06/2014    Past Surgical History:  Procedure Laterality Date  . ANKLE FRACTURE SURGERY  1988   rods and pins in place  . APPENDECTOMY  04/17/2016   Procedure: APPENDECTOMY;  Surgeon: Leafy Ro, MD;  Location: [facility] ORS;  Service: General;;  . CARPAL TUNNEL RELEASE Right 11/24/2016   Procedure: CARPAL TUNNEL RELEASE;  Surgeon: Deeann Saint, MD;  Location: [facility] ORS;  Service: Orthopedics;  Laterality: Right;  . CESAREAN SECTION  1996  . COLECTOMY WITH COLOSTOMY CREATION/HARTMANN PROCEDURE N/A 04/17/2016   Procedure: COLOSTOMY CREATION/HARTMANN PROCEDURE;  Surgeon: Leafy Ro, MD;  Location: [facility] ORS;  Service: General;  Laterality: N/A;  . ECTOPIC PREGNANCY SURGERY  2003  . LAPAROTOMY N/A 04/17/2016   Procedure: EXPLORATORY LAPAROTOMY;  Surgeon: Leafy Ro, MD;  Location: [facility] ORS;  Service: General;  Laterality: N/A;  . NECK SURGERY      Prior to Admission medications   Medication Sig Start Date End Date Taking? Authorizing Provider  acetaminophen (TYLENOL) 500 MG tablet Take 1,000 mg by mouth every 6 (six) hours as needed for moderate pain or headache.    [provider]  Aspirin-Salicylamide-Caffeine (BC HEADACHE PO) Take 1 packet by mouth 3 (three) times daily as needed  (headache).    [provider]  azelastine (ASTELIN) 0.1 % nasal spray Place 1 spray into the nose 2 (two) times daily. Use in each nostril as directed     [provider]  BIOTIN PO Take 1 tablet by mouth 2 (two) times daily.    [provider]  Brexpiprazole (REXULTI) 3 MG TABS Take 3 mg by mouth daily.    [provider]  budesonide-formoterol (SYMBICORT) 160-4.5 MCG/ACT inhaler Inhale 2 puffs into the lungs 2 (two) times daily. USE 2 PUFFS 2 TIMES A DAY Patient taking differently: Inhale 2 puffs into the lungs 2 (two) times daily.  11/02/15   Lada, Janit Bern, MD  butalbital-acetaminophen-caffeine (FIORICET, ESGIC) 50-325-40 MG tablet Take 1 tablet by mouth every 4 (four) hours as needed for headache.     [provider]  Calcium Carb-Cholecalciferol (CALCIUM 600+D3) 600-800 MG-UNIT TABS Take 1 tablet by mouth 2 (two) times daily.    [provider]  calcium carbonate (TUMS - DOSED IN MG ELEMENTAL CALCIUM) 500 MG  chewable tablet Chew 1-2 tablets by mouth 4 (four) times daily as needed for indigestion or heartburn.    [provider]  clonazePAM (KLONOPIN) 1 MG tablet Take 1 mg by mouth 3 (three) times daily.  02/09/14   [provider]  diltiazem (CARDIZEM CD) 180 MG 24 hr capsule Take 1 capsule (180 mg total) by mouth daily. 04/30/16   Henrene Dodge, MD  dimenhyDRINATE (DRAMAMINE) 50 MG tablet Take 50 mg by mouth 2 (two) times daily as needed for nausea or dizziness.     [provider]  EPIPEN 2-PAK 0.3 MG/0.3ML SOAJ injection Inject 0.3 mg into the muscle as needed (allergic reaction).  02/23/15   [provider]  fluticasone (FLONASE) 50 MCG/ACT nasal spray Place 1 spray into both nostrils 2 (two) times daily. 12/11/15   Kerman Passey, MD  gabapentin (NEURONTIN) 400 MG capsule Take 400 mg by mouth 3 (three) times daily.  02/09/14   [provider]  HYDROcodone-acetaminophen (NORCO) 5-325 MG tablet Take  1 tablet by mouth every 6 (six) hours as needed. 11/24/16   Deeann Saint, MD  hydrOXYzine (VISTARIL) 50 MG capsule Take 50 mg by mouth 2 (two) times daily.    [provider]  lamoTRIgine (LAMICTAL) 150 MG tablet Take 300 mg by mouth daily.    [provider]  levocetirizine (XYZAL) 5 MG tablet Take 5 mg by mouth every evening. 06/12/16   [provider]  meloxicam (MOBIC) 15 MG tablet Take 1 tablet (15 mg total) by mouth daily. 11/24/16   Deeann Saint, MD  montelukast (SINGULAIR) 10 MG tablet TAKE 1 TABLET (10 MG TOTAL) BY MOUTH AT BEDTIME. 01/23/16   Lada, Janit Bern, MD  nortriptyline (PAMELOR) 50 MG capsule Take 100 mg by mouth at bedtime.     [provider]  ondansetron (ZOFRAN) 4 MG tablet Take 4 mg by mouth every 6 (six) hours as needed for nausea or vomiting.    [provider]  polyethylene glycol powder (GLYCOLAX/MIRALAX) powder Take 17 g by mouth daily as needed for mild constipation. Patient taking differently: Take 17 g by mouth 2 (two) times daily.  04/17/16   Lada, Janit Bern, MD  prazosin (MINIPRESS) 5 MG capsule Take 5 mg by mouth at bedtime.    [provider]  promethazine (PHENERGAN) 25 MG tablet Take 25 mg by mouth every 6 (six) hours as needed for nausea or vomiting.    [provider]  ranitidine (ZANTAC) 150 MG tablet Take 150 mg by mouth daily.    [provider]  rOPINIRole (REQUIP) 2 MG tablet Take 2 mg by mouth 2 (two) times daily. 06/28/16   [provider]  sodium chloride (OCEAN) 0.65 % SOLN nasal spray Place 1 spray into both nostrils 2 (two) times daily as needed for congestion.    [provider]  Suvorexant (BELSOMRA) 20 MG TABS Take 20 mg by mouth at bedtime.    [provider]  tiZANidine (ZANAFLEX) 4 MG tablet Take 2 mg by mouth daily.    [provider]  Tolnaftate (ABSORBINE JR EX) Apply 1 application topically daily as needed (pain).    [provider]  traMADol (ULTRAM) 50 MG tablet Take 50 mg by mouth every 6 (six) hours as needed for pain. 11/14/16   [provider]  VENTOLIN HFA 108 (90 Base) MCG/ACT inhaler Inhale 2 puffs into the lungs every 4 (four) hours as needed for wheezing or shortness of breath. Patient  taking differently: Inhale 2 puffs into the lungs every 6 (six) hours as needed for wheezing or shortness of breath.  04/18/16   Kerman Passey, MD    Allergies Patient has no known allergies.  Family History  Problem Relation Age of Onset  . Heart disease Father   . Alcohol abuse Father   . Diabetes Father   . Heart disease Brother   . Depression Brother   . Stroke Brother   . Alcohol abuse Paternal Grandfather   . Osteoporosis Mother   . Breast cancer Neg Hx     Social History Social History   Tobacco Use  . Smoking status: Current Every Day Smoker    Packs/day: 1.00    Years: 32.00    Pack years: 32.00    Types: Cigarettes  . Smokeless tobacco: Never Used  Substance Use Topics  . Alcohol use: No    Alcohol/week: 0.0 standard drinks  . Drug use: No    Comment: history of cocaine use, clean for 3.5 years     Review of Systems Constitutional: No fever/chills Eyes: No visual changes. ENT: No sore throat. No stiff neck no neck pain Cardiovascular: Denies chest pain. Respiratory: Denies shortness of breath. Gastrointestinal:   no vomiting.  No diarrhea.  No constipation. Genitourinary: Negative for dysuria. Musculoskeletal: Negative lower extremity swelling Skin: Negative for rash. Neurological: Negative for severe headaches, focal weakness or numbness.   ____________________________________________   PHYSICAL EXAM:  VITAL SIGNS: ED Triage Vitals  Enc Vitals Group     BP 07/18/18 0701 108/79     Pulse Rate 07/18/18 0702 79     Resp 07/18/18 0703 16     Temp 07/18/18 0703 98 F (36.7 C)     Temp Source 07/18/18 0703 Oral     SpO2 07/18/18 0702 98 %     Weight  07/18/18 0704 174 lb 2.6 oz (79 kg)     Height 07/18/18 0704 5\' 8"  (1.727 m)     Head Circumference --      Peak Flow --      Pain Score 07/18/18 0704 10     Pain Loc --      Pain Edu? --      Excl. in GC? --     Constitutional: Asleep when into the room, wakes up to verbal stimuli, in no acute or discernible distress Eyes: Conjunctivae are normal Head: Atraumatic HEENT: No congestion/rhinnorhea. Mucous membranes are moist.  Oropharynx non-erythematous Neck:   Nontender with no meningismus, no masses, no stridor Cardiovascular: Normal rate, regular rhythm. Grossly normal heart sounds.  Good peripheral circulation. Respiratory: Normal respiratory effort.  No retractions. Lungs CTAB. Abdominal: Soft and nontender. No distention. No guarding no rebound, I am able to get an excellent exam on her belly while she sleeps and it does not wake her up and there is no evidence of discomfort or tenderness.  She has dressing intact with no drainage noted, and her wound looks quite healthy with pink granulomatous tissue. Back:  There is no focal tenderness or step off.  there is no midline tenderness there are no lesions noted. there is no CVA tenderness Musculoskeletal: No lower extremity tenderness, no upper extremity tenderness. No joint effusions, no DVT signs strong distal pulses no edema Neurologic:  Normal speech and language. No gross focal neurologic deficits are appreciated.  I am able to palpate her ankle while she sleeps and it does not bother her but after she wakes up she states  she has left ankle pain.  It is not red swollen or obviously in any way injured at this time.  Old scar noted from prior surgery Skin:  Skin is warm, dry and intact. No rash noted.   ____________________________________________   LABS (all labs ordered are listed, but only abnormal results are displayed)  Labs Reviewed - No data to display  Pertinent labs  results that were available during my care of the  patient were reviewed by me and considered in my medical decision making (see chart for details). ____________________________________________  EKG  I personally interpreted any EKGs ordered by me or triage  ____________________________________________  RADIOLOGY  Pertinent labs & imaging results that were available during my care of the patient were reviewed by me and considered in my medical decision making (see chart for details). If possible, patient and/or family made aware of any abnormal findings.  No results found. ____________________________________________    PROCEDURES  Procedure(s) performed: None  Procedures  Critical Care performed: None  ____________________________________________   INITIAL IMPRESSION / ASSESSMENT AND PLAN / ED COURSE  Pertinent labs & imaging results that were available during my care of the patient were reviewed by me and considered in my medical decision making (see chart for details).  Patient here with chronic pain issues, has been doctor shopping in the last couple days to get more narcotics.  Obviously were not going to give her narcotics we will defer management of her chronic pain to her primary care doctor and specialist.  Patient is very sleepy today.  She denies SI or HI states she did not take an overdose states she was up all night and took her regular meds late which have a sedating effect on her.  This certainly could be the case.  I do not see any evidence of obvious toxidrome when she wakes up well.  Her belly and Narang do not seem to have any acute processes today which require further evaluation.  We will observe her here to ensure that she is adequately awake prior to discharge but I do not see any acute issues today medical screening exam has been performed    ____________________________________________   FINAL CLINICAL IMPRESSION(S) / ED DIAGNOSES  Final diagnoses:  None      This chart was dictated using voice  recognition software.  Despite best efforts to proofread,  errors can occur which can change meaning.      Jeanmarie Plant, MD 07/18/18 [phone]

## 2018-07-29 ENCOUNTER — Emergency Department
Admission: EM | Admit: 2018-07-29 | Discharge: 2018-07-31 | Disposition: A | Discharge status: Home / Self Care | Payer: 59 | Attending: Emergency Medicine | Admitting: Emergency Medicine

## 2018-07-29 ENCOUNTER — Encounter: Payer: Self-pay | Admitting: Emergency Medicine

## 2018-07-29 ENCOUNTER — Other Ambulatory Visit: Payer: Self-pay

## 2018-07-29 DIAGNOSIS — F29 Unspecified psychosis not due to a substance or known physiological condition: Secondary | ICD-10-CM | POA: Diagnosis not present

## 2018-07-29 DIAGNOSIS — E039 Hypothyroidism, unspecified: Secondary | ICD-10-CM | POA: Insufficient documentation

## 2018-07-29 DIAGNOSIS — R109 Unspecified abdominal pain: Secondary | ICD-10-CM

## 2018-07-29 DIAGNOSIS — F22 Delusional disorders: Secondary | ICD-10-CM

## 2018-07-29 DIAGNOSIS — F3177 Bipolar disorder, in partial remission, most recent episode mixed: Secondary | ICD-10-CM | POA: Diagnosis present

## 2018-07-29 DIAGNOSIS — J449 Chronic obstructive pulmonary disease, unspecified: Secondary | ICD-10-CM | POA: Diagnosis not present

## 2018-07-29 DIAGNOSIS — Z79899 Other long term (current) drug therapy: Secondary | ICD-10-CM | POA: Diagnosis not present

## 2018-07-29 DIAGNOSIS — F17210 Nicotine dependence, cigarettes, uncomplicated: Secondary | ICD-10-CM | POA: Insufficient documentation

## 2018-07-29 DIAGNOSIS — G8929 Other chronic pain: Secondary | ICD-10-CM

## 2018-07-29 LAB — COMPREHENSIVE METABOLIC PANEL
ALK PHOS: 223 U/L — AB (ref 38–126)
ALT: 28 U/L (ref 0–44)
AST: 47 U/L — AB (ref 15–41)
Albumin: 4.2 g/dL (ref 3.5–5.0)
Anion gap: 9 (ref 5–15)
BILIRUBIN TOTAL: 0.5 mg/dL (ref 0.3–1.2)
BUN: 6 mg/dL (ref 6–20)
CALCIUM: 10.8 mg/dL — AB (ref 8.9–10.3)
CHLORIDE: 104 mmol/L (ref 98–111)
CO2: 25 mmol/L (ref 22–32)
CREATININE: 0.62 mg/dL (ref 0.44–1.00)
GFR calc Af Amer: >60 mL/min (ref >60)
Glucose, Bld: 109 mg/dL — ABNORMAL HIGH (ref 70–99)
Potassium: 3.6 mmol/L (ref 3.5–5.1)
Sodium: 138 mmol/L (ref 135–145)
Total Protein: 8.3 g/dL — ABNORMAL HIGH (ref 6.5–8.1)

## 2018-07-29 LAB — CBC WITH DIFFERENTIAL/PLATELET
ABS IMMATURE GRANULOCYTES: 0.07 10*3/uL (ref 0.00–0.07)
Basophils Absolute: 0.2 10*3/uL — ABNORMAL HIGH (ref 0.0–0.1)
Basophils Relative: 1 %
Eosinophils Absolute: 0.1 10*3/uL (ref 0.0–0.5)
Eosinophils Relative: 1 %
HEMATOCRIT: 37.3 % (ref 36.0–46.0)
HEMOGLOBIN: 12.3 g/dL (ref 12.0–15.0)
IMMATURE GRANULOCYTES: 1 %
LYMPHS ABS: 2.8 10*3/uL (ref 0.7–4.0)
LYMPHS PCT: 19 %
MCH: 29.2 pg (ref 26.0–34.0)
MCHC: 33 g/dL (ref 30.0–36.0)
MCV: 88.6 fL (ref 80.0–100.0)
MONOS PCT: 7 %
Monocytes Absolute: 0.9 10*3/uL (ref 0.1–1.0)
NEUTROS ABS: 10.4 10*3/uL — AB (ref 1.7–7.7)
NEUTROS PCT: 71 %
PLATELETS: 563 10*3/uL — AB (ref 150–400)
RBC: 4.21 MIL/uL (ref 3.87–5.11)
RDW: 15.3 % (ref 11.5–15.5)
WBC: 14.5 10*3/uL — ABNORMAL HIGH (ref 4.0–10.5)
nRBC: 0 % (ref 0.0–0.2)

## 2018-07-29 LAB — ACETAMINOPHEN LEVEL: Acetaminophen (Tylenol), Serum: <10 ug/mL — ABNORMAL LOW (ref 10–30)

## 2018-07-29 LAB — ETHANOL

## 2018-07-29 LAB — SALICYLATE LEVEL: Salicylate Lvl: <7 mg/dL (ref 2.8–30.0)

## 2018-07-29 MED ORDER — LORAZEPAM 2 MG/ML IJ SOLN
2.0000 mg | Freq: Once | INTRAMUSCULAR | Status: AC
  Administered 2018-07-29: 2 mg via INTRAMUSCULAR
  Filled 2018-07-29: qty 1

## 2018-07-29 MED ORDER — HALOPERIDOL LACTATE 5 MG/ML IJ SOLN
5.0000 mg | Freq: Once | INTRAMUSCULAR | Status: AC
  Administered 2018-07-29: 5 mg via INTRAMUSCULAR
  Filled 2018-07-29: qty 1

## 2018-07-29 MED ORDER — DIPHENHYDRAMINE HCL 50 MG/ML IJ SOLN
50.0000 mg | Freq: Once | INTRAMUSCULAR | Status: AC
  Administered 2018-07-29: 50 mg via INTRAMUSCULAR
  Filled 2018-07-29: qty 1

## 2018-07-29 NOTE — ED Triage Notes (Signed)
Pt arrived to the ED via EMS from home for complaints of abdominal pain secondary to abdominal surgery and for having racing thoughts. Dr. Pershing Proud is at bedside upon arrival. Pt is paranoid and uncooperative.

## 2018-07-29 NOTE — ED Notes (Signed)
Pt states that she does not understand why she is here and why they are keeping her. Pt stating that she does not want to stay and that she just wants to go home. Pt informed that she has been IVC'd and that she would be staying tonight in order to talk to the psychiatrist. Pt attempting to get out of bed and wanting to leave. Medications given at this time.

## 2018-07-29 NOTE — ED Provider Notes (Signed)
[facility] Emergency Department Provider Note  ____________________________________________   First MD Initiated Contact with Patient 07/29/18 2212     (approximate)  I have reviewed the triage vital signs and the nursing notes.   HISTORY  Chief Complaint Abdominal Pain and Psychiatric Evaluation   HPI Sabrina Espinoza is a 53 y.o. female with a history of bipolar disorder as well as need for wound care for colostomy reversal who is presenting to the emergency department with multiple complaints.  She initially called an ambulance for her mother.  However, when it was investigated, her mother had no complaints.  Patient is now saying that people are out to get her and that she is feeling that her head is full.  Denying any suicidal or homicidal ideation.  Denies any hallucinations.  Also complaining of chronic abdominal pain over the surgical incision site anteriorly.   Past Medical History:  Diagnosis Date  . Anxiety   . Arthritis    neck  . Bipolar disorder (HCC)   . Chronic constipation    on Linzess, Miralax, Zofran  . Chronic insomnia 11/02/2015  . COPD, moderate (HCC)   . Depression   . Depression with anxiety    Followed by Dr. Lynett Fish, psychiatrist at [facility] and Wilma Flavin for thrapy  . GERD (gastroesophageal reflux disease)   . Hepatitis    Hep C, history, has been treated and cured per pt.  . History of chicken pox   . History of cocaine use   . History of hiatal hernia   . History of pneumonia   . Hypothyroidism, adult    Working with Dr. Horton Chin, Endocrinology. S/P RAIU and is planning on biopsy  . Migraine without status migrainosus, not intractable   . Nicotine dependence, uncomplicated   . Nontoxic multinodular goiter    FNA done 10/30/14 with benign results. Seen [name]. Patient may want a second opinion.  . Rhinitis, allergic   . Vitamin D deficiency     Patient Active Problem List   Diagnosis Date Noted  . Abdominal pain 06/21/2016  . Cellulitis of abdominal wall 06/21/2016  . UTI (urinary tract infection) 06/21/2016  . Sepsis (HCC) 06/21/2016  . Acute cystitis without hematuria   . Fever 06/20/2016  . Postoperative intra-abdominal abscess 06/13/2016  . Diverticulitis large intestine 04/17/2016  . Diverticulitis of colon with perforation   . Pallor 03/11/2016  . Tongue papillae atrophy 03/11/2016  . Encounter for medication monitoring 03/11/2016  . Peripheral neuropathy 03/11/2016  . Thrush 03/11/2016  . Primary insomnia 02/04/2016  . Right cervical radiculopathy 01/15/2016  . Chronic insomnia 11/02/2015  . Right knee pain 08/22/2015  . Midline low back pain without sciatica 05/07/2015  . Elevated blood pressure 04/20/2015  . Polypharmacy 03/22/2015  . Foot pain, right 03/16/2015  . Pain pharynx 03/08/2015  . GERD without esophagitis 03/08/2015  . Abnormal mammogram of right breast 03/07/2015  . Degenerative disc disease, cervical 02/26/2015  . Bilateral leg cramps 02/26/2015  . Skin lesions, generalized 02/26/2015  . Allergic rhinitis with postnasal drip 01/31/2015  . Chronic sinusitis with recurrent bronchitis 01/31/2015  . Oral thrush 01/31/2015  . History of cocaine use 01/16/2015  . 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 Chronic hepatitis C without hepatic coma (HCC) 01/16/2015  . Chronic constipation 01/16/2015  . COPD, moderate (HCC) 01/16/2015  . Cigarette smoker 01/16/2015  . Bipolar 1 disorder, mixed, partial remission (HCC) 01/16/2015  . Migraine without aura and without status  migrainosus, not intractable 01/16/2015  . Depression with anxiety 01/16/2015  . Cephalalgia 03/06/2014  . Imbalance 03/06/2014  . Insomnia due to medical condition 03/06/2014  . Sleep disorder 03/06/2014    Past Surgical History:  Procedure Laterality Date  . ANKLE FRACTURE SURGERY  1988   rods and pins in place  . APPENDECTOMY  04/17/2016   Procedure:  APPENDECTOMY;  Surgeon: Leafy Ro, MD;  Location: [facility] ORS;  Service: General;;  . CARPAL TUNNEL RELEASE Right 11/24/2016   Procedure: CARPAL TUNNEL RELEASE;  Surgeon: Deeann Saint, MD;  Location: [facility] ORS;  Service: Orthopedics;  Laterality: Right;  . CESAREAN SECTION  1996  . COLECTOMY WITH COLOSTOMY CREATION/HARTMANN PROCEDURE N/A 04/17/2016   Procedure: COLOSTOMY CREATION/HARTMANN PROCEDURE;  Surgeon: Leafy Ro, MD;  Location: [facility] ORS;  Service: General;  Laterality: N/A;  . ECTOPIC PREGNANCY SURGERY  2003  . LAPAROTOMY N/A 04/17/2016   Procedure: EXPLORATORY LAPAROTOMY;  Surgeon: Leafy Ro, MD;  Location: [facility] ORS;  Service: General;  Laterality: N/A;  . NECK SURGERY      Prior to Admission medications   Medication Sig Start Date End Date Taking? Authorizing Provider  acetaminophen (TYLENOL) 500 MG tablet Take 1,000 mg by mouth every 6 (six) hours as needed for moderate pain or headache.    [provider]  Aspirin-Salicylamide-Caffeine (BC HEADACHE PO) Take 1 packet by mouth 3 (three) times daily as needed (headache).    [provider]  azelastine (ASTELIN) 0.1 % nasal spray Place 1 spray into the nose 2 (two) times daily. Use in each nostril as directed     [provider]  BIOTIN PO Take 1 tablet by mouth 2 (two) times daily.    [provider]  Brexpiprazole (REXULTI) 3 MG TABS Take 3 mg by mouth daily.    [provider]  budesonide-formoterol (SYMBICORT) 160-4.5 MCG/ACT inhaler Inhale 2 puffs into the lungs 2 (two) times daily. USE 2 PUFFS 2 TIMES A DAY Patient taking differently: Inhale 2 puffs into the lungs 2 (two) times daily.  11/02/15   Lada, Janit Bern, MD  butalbital-acetaminophen-caffeine (FIORICET, ESGIC) 50-325-40 MG tablet Take 1 tablet by mouth every 4 (four) hours as needed for headache.     [provider]  Calcium Carb-Cholecalciferol (CALCIUM 600+D3) 600-800 MG-UNIT TABS Take 1 tablet by mouth 2 (two)  times daily.    [provider]  calcium carbonate (TUMS - DOSED IN MG ELEMENTAL CALCIUM) 500 MG chewable tablet Chew 1-2 tablets by mouth 4 (four) times daily as needed for indigestion or heartburn.    [provider]  clonazePAM (KLONOPIN) 1 MG tablet Take 1 mg by mouth 3 (three) times daily.  02/09/14   [provider]  diltiazem (CARDIZEM CD) 180 MG 24 hr capsule Take 1 capsule (180 mg total) by mouth daily. 04/30/16   Henrene Dodge, MD  dimenhyDRINATE (DRAMAMINE) 50 MG tablet Take 50 mg by mouth 2 (two) times daily as needed for nausea or dizziness.     [provider]  EPIPEN 2-PAK 0.3 MG/0.3ML SOAJ injection Inject 0.3 mg into the muscle as needed (allergic reaction).  02/23/15   [provider]  fluticasone (FLONASE) 50 MCG/ACT nasal spray Place 1 spray into both nostrils 2 (two) times daily. 12/11/15   Kerman Passey, MD  gabapentin (NEURONTIN) 400 MG capsule Take 400 mg by mouth 3 (three) times daily.  02/09/14   [provider]  HYDROcodone-acetaminophen (NORCO) 5-325 MG tablet Take 1 tablet  by mouth every 6 (six) hours as needed. 11/24/16   Deeann Saint, MD  hydrOXYzine (VISTARIL) 50 MG capsule Take 50 mg by mouth 2 (two) times daily.    [provider]  lamoTRIgine (LAMICTAL) 150 MG tablet Take 300 mg by mouth daily.    [provider]  levocetirizine (XYZAL) 5 MG tablet Take 5 mg by mouth every evening. 06/12/16   [provider]  meloxicam (MOBIC) 15 MG tablet Take 1 tablet (15 mg total) by mouth daily. 11/24/16   Deeann Saint, MD  montelukast (SINGULAIR) 10 MG tablet TAKE 1 TABLET (10 MG TOTAL) BY MOUTH AT BEDTIME. 01/23/16   Lada, Janit Bern, MD  nortriptyline (PAMELOR) 50 MG capsule Take 100 mg by mouth at bedtime.     [provider]  ondansetron (ZOFRAN) 4 MG tablet Take 4 mg by mouth every 6 (six) hours as needed for nausea or vomiting.    [provider]  polyethylene glycol powder  (GLYCOLAX/MIRALAX) powder Take 17 g by mouth daily as needed for mild constipation. Patient taking differently: Take 17 g by mouth 2 (two) times daily.  04/17/16   Lada, Janit Bern, MD  prazosin (MINIPRESS) 5 MG capsule Take 5 mg by mouth at bedtime.    [provider]  promethazine (PHENERGAN) 25 MG tablet Take 25 mg by mouth every 6 (six) hours as needed for nausea or vomiting.    [provider]  ranitidine (ZANTAC) 150 MG tablet Take 150 mg by mouth daily.    [provider]  rOPINIRole (REQUIP) 2 MG tablet Take 2 mg by mouth 2 (two) times daily. 06/28/16   [provider]  sodium chloride (OCEAN) 0.65 % SOLN nasal spray Place 1 spray into both nostrils 2 (two) times daily as needed for congestion.    [provider]  Suvorexant (BELSOMRA) 20 MG TABS Take 20 mg by mouth at bedtime.    [provider]  tiZANidine (ZANAFLEX) 4 MG tablet Take 2 mg by mouth daily.    [provider]  Tolnaftate (ABSORBINE JR EX) Apply 1 application topically daily as needed (pain).    [provider]  traMADol (ULTRAM) 50 MG tablet Take 50 mg by mouth every 6 (six) hours as needed for pain. 11/14/16   [provider]  VENTOLIN HFA 108 (90 Base) MCG/ACT inhaler Inhale 2 puffs into the lungs every 4 (four) hours as needed for wheezing or shortness of breath. Patient taking differently: Inhale 2 puffs into the lungs every 6 (six) hours as needed for wheezing or shortness of breath.  04/18/16   Kerman Passey, MD    Allergies Patient has no known allergies.  Family History  Problem Relation Age of Onset  . Heart disease Father   . Alcohol abuse Father   . Diabetes Father   . Heart disease Brother   . Depression Brother   . Stroke Brother   . Alcohol abuse Paternal Grandfather   . Osteoporosis Mother   . Breast cancer Neg Hx     Social History Social History   Tobacco Use  . Smoking status: Current Every Day Smoker     Packs/day: 1.00    Years: 32.00    Pack years: 32.00    Types: Cigarettes  . Smokeless tobacco: Never Used  Substance Use Topics  . Alcohol use: No    Alcohol/week: 0.0 standard drinks  . Drug use: No    Comment: history of cocaine use, clean for 3.5  years     Review of Systems  Constitutional: No fever/chills Eyes: No visual changes. ENT: No sore throat. Cardiovascular: Denies chest pain. Respiratory: Denies shortness of breath. Gastrointestinal:  No nausea, no vomiting.  No diarrhea.  No constipation. Genitourinary: Negative for dysuria. Musculoskeletal: Negative for back pain. Skin: Negative for rash. Neurological: Negative for headaches, focal weakness or numbness.   ____________________________________________   PHYSICAL EXAM:  VITAL SIGNS: ED Triage Vitals  Enc Vitals Group     BP 07/29/18 2212 (!) 181/158     Pulse Rate 07/29/18 2212 (!) 107     Resp 07/29/18 2212 18     Temp 07/29/18 2212 97.7 F (36.5 C)     Temp Source 07/29/18 2212 Oral     SpO2 07/29/18 2211 98 %     Weight 07/29/18 2213 176 lb 5.9 oz (80 kg)     Height 07/29/18 2213 5\' 8"  (1.727 m)     Head Circumference --      Peak Flow --      Pain Score 07/29/18 2212 10     Pain Loc --      Pain Edu? --      Excl. in GC? --     Constitutional: Alert and oriented.  Odd affect, tangential.  Appears paranoid. Eyes: Conjunctivae are normal.  Head: Atraumatic. Nose: No congestion/rhinnorhea. Mouth/Throat: Mucous membranes are moist.  Neck: No stridor.   Cardiovascular: Normal rate, regular rhythm. Grossly normal heart sounds.   Respiratory: Normal respiratory effort.  No retractions. Lungs CTAB. Gastrointestinal: Soft and nontender. No distention.  Patient with healing wound to the midline of the abdomen with what appears to be healthy granulation tissue that is pink without exudate or foul smell.  No tenderness to palpation. Musculoskeletal: No lower extremity tenderness nor edema.  No joint  effusions. Neurologic:  Normal speech and language. No gross focal neurologic deficits are appreciated. Skin:  Skin is warm, dry and intact. No rash noted. Psychiatric: As above and constitutional.  ____________________________________________   LABS (all labs ordered are listed, but only abnormal results are displayed)  Labs Reviewed  CBC WITH DIFFERENTIAL/PLATELET  COMPREHENSIVE METABOLIC PANEL  ETHANOL  SALICYLATE LEVEL  ACETAMINOPHEN LEVEL  URINALYSIS, COMPLETE (UACMP) WITH MICROSCOPIC  URINE DRUG SCREEN, QUALITATIVE ([facility] ONLY)   ____________________________________________  EKG   ____________________________________________  RADIOLOGY   ____________________________________________   PROCEDURES  Procedure(s) performed:   Procedures  Critical Care performed:   ____________________________________________   INITIAL IMPRESSION / ASSESSMENT AND PLAN / ED COURSE  Pertinent labs & imaging results that were available during my care of the patient were reviewed by me and considered in my medical decision making (see chart for details).  DDX: Nonhealing wound, chronic abdominal pain, psychosis, bipolar disorder, manic episode, paranoia As part of my medical decision making, I reviewed the following data within the electronic MEDICAL RECORD NUMBER Notes from prior ED visits  ----------------------------------------- 10:40 PM on 07/29/2018 -----------------------------------------  Patient with somewhat erratic behavior, standing at the doorway, threatening to leave.  Patient will be involuntarily committed.  To be seen by psychiatry. ____________________________________________   FINAL CLINICAL IMPRESSION(S) / ED DIAGNOSES  Paranoia.    NEW MEDICATIONS STARTED DURING THIS VISIT:  New Prescriptions   No medications on file     Note:  This document was prepared using Dragon voice recognition software and may include unintentional dictation errors.       Myrna BlazerSchaevitz, David [name], MD 07/29/18 2240

## 2018-07-29 NOTE — ED Notes (Signed)
Pt given food tray at this time

## 2018-07-30 DIAGNOSIS — F29 Unspecified psychosis not due to a substance or known physiological condition: Secondary | ICD-10-CM | POA: Diagnosis not present

## 2018-07-30 MED ORDER — ALBUTEROL SULFATE HFA 108 (90 BASE) MCG/ACT IN AERS
2.0000 | INHALATION_SPRAY | Freq: Four times a day (QID) | RESPIRATORY_TRACT | Status: DC | PRN
  Filled 2018-07-30: qty 6.7

## 2018-07-30 MED ORDER — MELATONIN 5 MG PO TABS: 10.0000 mg | ORAL_TABLET | Freq: Every evening | ORAL | Status: DC

## 2018-07-30 MED ORDER — ROPINIROLE HCL 1 MG PO TABS
2.0000 mg | ORAL_TABLET | Freq: Two times a day (BID) | ORAL | Status: DC
  Administered 2018-07-30 – 2018-07-31 (×2): 2 mg via ORAL
  Filled 2018-07-30 (×3): qty 2

## 2018-07-30 MED ORDER — GABAPENTIN 400 MG PO CAPS
400.0000 mg | ORAL_CAPSULE | Freq: Three times a day (TID) | ORAL | Status: DC
  Administered 2018-07-30 – 2018-07-31 (×2): 400 mg via ORAL
  Filled 2018-07-30 (×4): qty 1
  Filled 2018-07-30 (×2): qty 4

## 2018-07-30 MED ORDER — ACETAMINOPHEN 500 MG PO TABS
1000.0000 mg | ORAL_TABLET | Freq: Four times a day (QID) | ORAL | Status: DC | PRN
  Administered 2018-07-31: 1000 mg via ORAL
  Filled 2018-07-30: qty 2

## 2018-07-30 MED ORDER — PRAZOSIN HCL 1 MG PO CAPS
5.0000 mg | ORAL_CAPSULE | Freq: Every day | ORAL | Status: DC
  Administered 2018-07-30: 5 mg via ORAL
  Filled 2018-07-30 (×2): qty 1

## 2018-07-30 MED ORDER — AMOXICILLIN 500 MG PO CAPS
500.0000 mg | ORAL_CAPSULE | Freq: Three times a day (TID) | ORAL | Status: DC
  Administered 2018-07-30: 500 mg via ORAL
  Filled 2018-07-30 (×6): qty 1

## 2018-07-30 MED ORDER — LAMOTRIGINE 100 MG PO TABS
300.0000 mg | ORAL_TABLET | Freq: Every day | ORAL | Status: DC
  Administered 2018-07-30 – 2018-07-31 (×2): 300 mg via ORAL
  Filled 2018-07-30 (×2): qty 3

## 2018-07-30 MED ORDER — NORTRIPTYLINE HCL 25 MG PO CAPS
100.0000 mg | ORAL_CAPSULE | Freq: Every day | ORAL | Status: DC
  Administered 2018-07-30: 100 mg via ORAL
  Filled 2018-07-30 (×2): qty 4

## 2018-07-30 MED ORDER — LEVOTHYROXINE SODIUM 50 MCG PO TABS: 75.0000 ug | ORAL_TABLET | Freq: Every day | ORAL | Status: DC

## 2018-07-30 MED ORDER — TOPIRAMATE 25 MG PO TABS
100.0000 mg | ORAL_TABLET | Freq: Every day | ORAL | Status: DC
  Administered 2018-07-31: 100 mg via ORAL
  Filled 2018-07-30 (×2): qty 1

## 2018-07-30 MED ORDER — MONTELUKAST SODIUM 10 MG PO TABS
10.0000 mg | ORAL_TABLET | Freq: Every day | ORAL | Status: DC
  Administered 2018-07-30: 10 mg via ORAL
  Filled 2018-07-30 (×2): qty 1

## 2018-07-30 MED ORDER — METOPROLOL SUCCINATE ER 50 MG PO TB24
50.0000 mg | ORAL_TABLET | Freq: Every day | ORAL | Status: DC
  Administered 2018-07-31: 50 mg via ORAL
  Filled 2018-07-30 (×3): qty 1

## 2018-07-30 MED ORDER — CLONAZEPAM 0.5 MG PO TABS
1.0000 mg | ORAL_TABLET | Freq: Three times a day (TID) | ORAL | Status: DC
  Administered 2018-07-30 – 2018-07-31 (×3): 1 mg via ORAL
  Filled 2018-07-30 (×2): qty 1
  Filled 2018-07-30: qty 2

## 2018-07-30 MED ORDER — FLUTICASONE PROPIONATE 50 MCG/ACT NA SUSP
1.0000 | Freq: Two times a day (BID) | NASAL | Status: DC
  Administered 2018-07-30 – 2018-07-31 (×2): 1 via NASAL
  Filled 2018-07-30: qty 16

## 2018-07-30 MED ORDER — MOMETASONE FURO-FORMOTEROL FUM 200-5 MCG/ACT IN AERO
2.0000 | INHALATION_SPRAY | Freq: Two times a day (BID) | RESPIRATORY_TRACT | Status: DC
  Administered 2018-07-30: 2 via RESPIRATORY_TRACT
  Filled 2018-07-30: qty 8.8

## 2018-07-30 MED ORDER — BREXPIPRAZOLE 1 MG PO TABS
4.0000 mg | ORAL_TABLET | Freq: Every day | ORAL | Status: DC
  Administered 2018-07-31: 4 mg via ORAL
  Filled 2018-07-30 (×3): qty 4

## 2018-07-30 MED ORDER — ONDANSETRON HCL 4 MG PO TABS: 4.0000 mg | ORAL_TABLET | Freq: Four times a day (QID) | ORAL | Status: DC | PRN

## 2018-07-30 MED ORDER — DILTIAZEM HCL ER COATED BEADS 180 MG PO CP24
180.0000 mg | ORAL_CAPSULE | Freq: Every day | ORAL | Status: DC
  Administered 2018-07-30 – 2018-07-31 (×2): 180 mg via ORAL
  Filled 2018-07-30 (×2): qty 1

## 2018-07-30 MED ORDER — PANTOPRAZOLE SODIUM 40 MG PO TBEC
40.0000 mg | DELAYED_RELEASE_TABLET | Freq: Every day | ORAL | Status: DC
  Administered 2018-07-31: 40 mg via ORAL
  Filled 2018-07-30 (×2): qty 1

## 2018-07-30 NOTE — ED Notes (Addendum)
Pt suspicious of all medications. RN showed patient each medication (prior to being opened) and explained its usage.  Pateint only agreeable to taking 3 of 7 medications.    Maintained on 15 minute checks and observation by security camera for safety.

## 2018-07-30 NOTE — ED Notes (Signed)
RN called pharmacy tech to have patient medications reconciled.
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
## 2018-07-30 NOTE — ED Notes (Signed)
IVC/Pending Consult 

## 2018-07-30 NOTE — ED Notes (Signed)
Unable to get blood pressure due to patient not sitting still and not wanting blood pressure to be taken.

## 2018-07-30 NOTE — ED Notes (Signed)
Reiterated the need to keep the door to patients room closed if not fully clothed. Patient indicated that she understood.

## 2018-07-30 NOTE — ED Notes (Signed)
Hourly rounding reveals patient in room. No complaints, stable, in no acute distress. Q15 minute rounds and monitoring via Security Cameras to continue. 

## 2018-07-30 NOTE — ED Notes (Signed)
Pt given lunch tray.

## 2018-07-30 NOTE — ED Notes (Signed)
Patient pulling the sheet off her bed and removing her top stating "it's hot". Redirection attempted with little success. Patient informed that she needs to keep her door closed.

## 2018-07-30 NOTE — ED Notes (Signed)
Hourly rounding reveals patient sleeping in room. No complaints, stable, in no acute distress. Q15 minute rounds and monitoring via Security Cameras to continue. 

## 2018-07-30 NOTE — ED Notes (Signed)
Hourly rounding reveals patient in sleeping room. No complaints, stable, in no acute distress. Q15 minute rounds and monitoring via Security Cameras to continue. 

## 2018-07-30 NOTE — ED Notes (Signed)
RN attempted BP reading.  The 1st reading was inaccurate because the patient would not stay still.  Pt refused the 2nd attempt.  Pt kept asking, "What are yall doing to me?"  Other VS documented. Maintained on 15 minute checks and observation by security camera for safety.

## 2018-07-30 NOTE — ED Notes (Signed)
Hourly rounding reveals patient in rest room. No complaints, stable, in no acute distress. Q15 minute rounds and monitoring via Security Cameras to continue. 

## 2018-07-30 NOTE — ED Notes (Signed)
Pt cooperative, but paranoid and suspicious of staff.  Pt requested to use the phone twice, but would not tell staff the number to dial for her.  Pt unable to use the phone successfully.  Maintained on 15 minute checks and observation by security camera for safety.

## 2018-07-30 NOTE — ED Notes (Signed)
Pt pulling at BP cuff while obtaining VS.  BP reading was 167/119

## 2018-07-30 NOTE — ED Notes (Addendum)
Pt dressed out by this RN and Lacinda Axon, RN. Pt had tshirt, sweatshirt, socks, boots, sweatpants, underwear, long sleave shirt, jacket, wool jacket, bra, 4 white gold rings, and 1 white gold watch. Pt dressed out in appropriate scrubs and put in wheelchair to go to BHU 3.

## 2018-07-30 NOTE — ED Notes (Signed)
Snack and beverage given. 

## 2018-07-30 NOTE — ED Notes (Signed)
Report to include Situation, Background, Assessment, and Recommendations received from Amy RN. Patient alert and oriented, warm and dry, in no acute distress. Patient is paranoid and suspicious with staff. She refused to take her VS taken at the beginning of this shift.   UTA SI, HI, AVH and pain. Patient made aware of Q15 minute rounds and security cameras for their safety. Patient instructed to come to me with needs or concerns.

## 2018-07-30 NOTE — BH Assessment (Signed)
Discussed patient with Psych MD (Dr. Jennet Maduro), patient meets criteria for inpatient treatment. Spoke with [facility] BMU Assistance, Baxter Hire about the patient been admitted. Had concerns about her wound care.

## 2018-07-30 NOTE — ED Notes (Signed)
Pt. Transferred to BHU from ED to room 3 after screening for contraband. Report to include Situation, Background, Assessment and Recommendations from RN. Pt. Oriented to unit including Q15 minute rounds as well as the security cameras for their protection. Patient is alert and oriented, warm and dry in no acute distress. Patient denies SI, HI, and AVH. Pt. Encouraged to let me know if needs arise. 

## 2018-07-30 NOTE — ED Notes (Signed)
Pt given meal tray.

## 2018-07-31 ENCOUNTER — Emergency Department: Payer: 59

## 2018-07-31 DIAGNOSIS — F29 Unspecified psychosis not due to a substance or known physiological condition: Secondary | ICD-10-CM | POA: Diagnosis not present

## 2018-07-31 LAB — CBC WITH DIFFERENTIAL/PLATELET
Abs Immature Granulocytes: 0.05 10*3/uL (ref 0.00–0.07)
Basophils Absolute: 0.1 10*3/uL (ref 0.0–0.1)
Basophils Relative: 1 %
Eosinophils Absolute: 0.2 10*3/uL (ref 0.0–0.5)
Eosinophils Relative: 2 %
HCT: 34.2 % — ABNORMAL LOW (ref 36.0–46.0)
HEMOGLOBIN: 11.3 g/dL — AB (ref 12.0–15.0)
Immature Granulocytes: 1 %
Lymphocytes Relative: 25 %
Lymphs Abs: 2.8 10*3/uL (ref 0.7–4.0)
MCH: 29 pg (ref 26.0–34.0)
MCHC: 33 g/dL (ref 30.0–36.0)
MCV: 87.9 fL (ref 80.0–100.0)
MONO ABS: 1 10*3/uL (ref 0.1–1.0)
Monocytes Relative: 9 %
Neutro Abs: 6.8 10*3/uL (ref 1.7–7.7)
Neutrophils Relative %: 62 %
Platelets: 441 10*3/uL — ABNORMAL HIGH (ref 150–400)
RBC: 3.89 MIL/uL (ref 3.87–5.11)
RDW: 14.8 % (ref 11.5–15.5)
WBC: 11 10*3/uL — ABNORMAL HIGH (ref 4.0–10.5)
nRBC: 0 % (ref 0.0–0.2)

## 2018-07-31 LAB — COMPREHENSIVE METABOLIC PANEL
ALT: 34 U/L (ref 0–44)
AST: 68 U/L — AB (ref 15–41)
Albumin: 3.7 g/dL (ref 3.5–5.0)
Alkaline Phosphatase: 188 U/L — ABNORMAL HIGH (ref 38–126)
Anion gap: 8 (ref 5–15)
BUN: 7 mg/dL (ref 6–20)
CO2: 24 mmol/L (ref 22–32)
Calcium: 9.9 mg/dL (ref 8.9–10.3)
Chloride: 102 mmol/L (ref 98–111)
Creatinine, Ser: 0.83 mg/dL (ref 0.44–1.00)
GFR calc Af Amer: >60 mL/min (ref >60)
GFR calc non Af Amer: >60 mL/min (ref >60)
Glucose, Bld: 119 mg/dL — ABNORMAL HIGH (ref 70–99)
Potassium: 3.2 mmol/L — ABNORMAL LOW (ref 3.5–5.1)
SODIUM: 134 mmol/L — AB (ref 135–145)
Total Bilirubin: 0.5 mg/dL (ref 0.3–1.2)
Total Protein: 7.2 g/dL (ref 6.5–8.1)

## 2018-07-31 LAB — TROPONIN I: Troponin I: <0.03 ng/mL (ref <0.03)

## 2018-07-31 LAB — LIPASE, BLOOD: Lipase: 39 U/L (ref 11–51)

## 2018-07-31 NOTE — ED Notes (Signed)
Hourly rounding reveals patient in room. No complaints, stable, in no acute distress. Q15 minute rounds and monitoring via Security Cameras to continue. 

## 2018-07-31 NOTE — ED Provider Notes (Signed)
[facility] Emergency Department Provider Note   ____________________________________________   First MD Initiated Contact with Patient 07/29/18 2212     (approximate)  I have reviewed the triage vital signs and the nursing notes.   HISTORY  Chief Complaint Abdominal Pain and Psychiatric Evaluation   HPI Sabrina Espinoza is a 52 y.o. female who was transferred back to the main side of the ER from the [facility] for tachycardia up to 130.  Patient reports history of chronic abdominal pain.  She says it is better now since she got her medications.  In fact her heart rate is down to 99 at this point.  I will get an EKG and some lab work and see what that looks like.  Her white count had been high 2 days ago.   Past Medical History:  Diagnosis Date  . Anxiety   . Arthritis    neck  . Bipolar disorder (HCC)   . Chronic constipation    on Linzess, Miralax, Zofran  . Chronic insomnia 11/02/2015  . COPD, moderate (HCC)   . Depression   . Depression with anxiety    Followed by Dr. Lynett Fish, psychiatrist at [facility] and Wilma Flavin for thrapy  . GERD (gastroesophageal reflux disease)   . Hepatitis    Hep C, history, has been treated and cured per pt.  . History of chicken pox   . History of cocaine use   . History of hiatal hernia   . History of pneumonia   . Hypothyroidism, adult    Working with Dr. Horton Chin, Endocrinology. S/P RAIU and is planning on biopsy  . Migraine without status migrainosus, not intractable   . Nicotine dependence, uncomplicated   . Nontoxic multinodular goiter    FNA done 10/30/14 with benign results. Seen [name]. Patient may want a second opinion.  . Rhinitis, allergic   . Vitamin D deficiency     Patient Active Problem List   Diagnosis Date Noted  . Abdominal pain 06/21/2016  . Cellulitis of abdominal wall 06/21/2016  . UTI (urinary tract infection) 06/21/2016  . Sepsis (HCC) 06/21/2016  . Acute  cystitis without hematuria   . Fever 06/20/2016  . Postoperative intra-abdominal abscess 06/13/2016  . Diverticulitis large intestine 04/17/2016  . Diverticulitis of colon with perforation   . Pallor 03/11/2016  . Tongue papillae atrophy 03/11/2016  . Encounter for medication monitoring 03/11/2016  . Peripheral neuropathy 03/11/2016  . Thrush 03/11/2016  . Primary insomnia 02/04/2016  . Right cervical radiculopathy 01/15/2016  . Chronic insomnia 11/02/2015  . Right knee pain 08/22/2015  . Midline low back pain without sciatica 05/07/2015  . Elevated blood pressure 04/20/2015  . Polypharmacy 03/22/2015  . Foot pain, right 03/16/2015  . Pain pharynx 03/08/2015  . GERD without esophagitis 03/08/2015  . Abnormal mammogram of right breast 03/07/2015  . Degenerative disc disease, cervical 02/26/2015  . Bilateral leg cramps 02/26/2015  . Skin lesions, generalized 02/26/2015  . Allergic rhinitis with postnasal drip 01/31/2015  . Chronic sinusitis with recurrent bronchitis 01/31/2015  . Oral thrush 01/31/2015  . History of cocaine use 01/16/2015  . 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 Chronic hepatitis C without hepatic coma (HCC) 01/16/2015  . Chronic constipation 01/16/2015  . COPD, moderate (HCC) 01/16/2015  . Cigarette smoker 01/16/2015  . Bipolar 1 disorder, mixed, partial remission (HCC) 01/16/2015  . Migraine without aura and without status migrainosus, not intractable 01/16/2015  . Depression with anxiety 01/16/2015  .  Cephalalgia 03/06/2014  . Imbalance 03/06/2014  . Insomnia due to medical condition 03/06/2014  . Sleep disorder 03/06/2014    Past Surgical History:  Procedure Laterality Date  . ANKLE FRACTURE SURGERY  1988   rods and pins in place  . APPENDECTOMY  04/17/2016   Procedure: APPENDECTOMY;  Surgeon: [name], MD;  Location: [facility] ORS;  Service: General;;  . CARPAL TUNNEL RELEASE Right 11/24/2016   Procedure: CARPAL TUNNEL RELEASE;  Surgeon:  [name], [name], MD;  Location: [facility] ORS;  Service: Orthopedics;  Laterality: Right;  . CESAREAN SECTION  1996  . COLECTOMY WITH COLOSTOMY CREATION/HARTMANN PROCEDURE N/A 04/17/2016   Procedure: COLOSTOMY CREATION/HARTMANN PROCEDURE;  Surgeon: [name], MD;  Location: [facility] ORS;  Service: General;  Laterality: N/A;  . ECTOPIC PREGNANCY SURGERY  2003  . LAPAROTOMY N/A 04/17/2016   Procedure: EXPLORATORY LAPAROTOMY;  Surgeon: [name], MD;  Location: [facility] ORS;  Service: General;  Laterality: N/A;  . NECK SURGERY      Prior to Admission medications   Medication Sig Start Date End Date Taking? Authorizing Provider  amoxicillin (AMOXIL) 500 MG capsule Take 500 mg by mouth every 8 (eight) hours. 07/06/18 01/02/19 Yes [provider]  Brexpiprazole 4 MG TABS Take 4 mg by mouth daily. 07/11/18  Yes [provider]  budesonide-formoterol (SYMBICORT) 160-4.5 MCG/ACT inhaler Inhale 2 puffs into the lungs 2 (two) times daily. USE 2 PUFFS 2 TIMES A DAY Patient taking differently: Inhale 2 puffs into the lungs 2 (two) times daily.  11/02/15  Yes Lada, Janit BernMelinda P, MD  clonazePAM (KLONOPIN) 1 MG tablet Take 1 mg by mouth 3 (three) times daily.  02/09/14  Yes [provider]  fluticasone (FLONASE) 50 MCG/ACT nasal spray Place 1 spray into both nostrils 2 (two) times daily. 12/11/15  Yes Lada, Janit BernMelinda P, MD  gabapentin (NEURONTIN) 400 MG capsule Take 400 mg by mouth 3 (three) times daily.  02/09/14  Yes [provider]  hydrOXYzine (VISTARIL) 50 MG capsule Take 50 mg by mouth 2 (two) times daily.   Yes [provider]  lamoTRIgine (LAMICTAL) 150 MG tablet Take 300 mg by mouth daily.   Yes [provider]  levocetirizine (XYZAL) 5 MG tablet Take 5 mg by mouth daily. 01/31/16  Yes [provider]  Melatonin 5 MG CAPS Take 10 mg by mouth Nightly.   Yes [provider]  metoprolol succinate (TOPROL-XL) 50 MG 24 hr tablet Take 50 mg by mouth daily.  01/04/18  Yes [provider]  montelukast (SINGULAIR) 10 MG tablet TAKE 1 TABLET (10 MG TOTAL) BY MOUTH AT BEDTIME. 01/23/16  Yes Lada, Janit BernMelinda P, MD  nortriptyline (PAMELOR) 50 MG capsule Take 100 mg by mouth at bedtime.    Yes [provider]  prazosin (MINIPRESS) 5 MG capsule Take 5 mg by mouth at bedtime.   Yes [provider]  rOPINIRole (REQUIP) 2 MG tablet Take 2 mg by mouth 2 (two) times daily. 06/28/16  Yes [provider]  SYNTHROID 75 MCG tablet Take 75 mcg by mouth daily. 07/27/18  Yes [provider]  topiramate (TOPAMAX) 100 MG tablet Take 100 mg by mouth daily. 07/11/18  Yes [provider]  traMADol (ULTRAM) 50 MG tablet Take 50 mg by mouth every 6 (six) hours as needed for pain. 11/14/16  Yes [provider]  acetaminophen (TYLENOL) 500 MG tablet Take 1,000 mg by mouth every 6 (six) hours as needed for moderate pain or headache.  [provider]  Aspirin-Salicylamide-Caffeine (BC HEADACHE PO) Take 1 packet by mouth 3 (three) times daily as needed (headache).    [provider]  azelastine (ASTELIN) 0.1 % nasal spray Place 1 spray into the nose 2 (two) times daily. Use in each nostril as directed     [provider]  butalbital-acetaminophen-caffeine (FIORICET, ESGIC) 50-325-40 MG tablet Take 1 tablet by mouth every 4 (four) hours as needed for headache.     [provider]  diltiazem (CARDIZEM CD) 180 MG 24 hr capsule Take 1 capsule (180 mg total) by mouth daily. Patient not taking: Reported on 07/30/2018 04/30/16   Henrene DodgePiscoya, Jose, MD  EPIPEN 2-PAK 0.3 MG/0.3ML SOAJ injection Inject 0.3 mg into the muscle as needed (allergic reaction).  02/23/15   [provider]  HYDROcodone-acetaminophen (NORCO) 5-325 MG tablet Take 1 tablet by mouth every 6 (six) hours as needed. Patient not taking: Reported on 07/30/2018 11/24/16   [name], [name], MD  meloxicam (MOBIC) 15 MG tablet Take 1 tablet  (15 mg total) by mouth daily. Patient not taking: Reported on 07/30/2018 11/24/16   [name], [name], MD  ondansetron (ZOFRAN) 4 MG tablet Take 4 mg by mouth every 6 (six) hours as needed for nausea or vomiting.    [provider]  pantoprazole (PROTONIX) 40 MG tablet Take 40 mg by mouth daily.    [provider]  polyethylene glycol powder (GLYCOLAX/MIRALAX) powder Take 17 g by mouth daily as needed for mild constipation. Patient taking differently: Take 17 g by mouth 2 (two) times daily.  04/17/16   Kerman PasseyLada, Melinda P, MD  promethazine (PHENERGAN) 25 MG tablet Take 25 mg by mouth every 6 (six) hours as needed for nausea or vomiting.    [provider]  Suvorexant (BELSOMRA) 20 MG TABS Take 20 mg by mouth at bedtime.    [provider]  tiZANidine (ZANAFLEX) 4 MG tablet Take 2 mg by mouth daily.    [provider]  Tolnaftate (ABSORBINE JR EX) Apply 1 application topically daily as needed (pain).    [provider]  VENTOLIN HFA 108 (90 Base) MCG/ACT inhaler Inhale 2 puffs into the lungs every 4 (four) hours as needed for wheezing or shortness of breath. Patient taking differently: Inhale 2 puffs into the lungs every 6 (six) hours as needed for wheezing or shortness of breath.  04/18/16   Kerman PasseyLada, Melinda P, MD    Allergies Patient has no known allergies.  Family History  Problem Relation Age of Onset  . Heart disease Father   . Alcohol abuse Father   . Diabetes Father   . Heart disease Brother   . Depression Brother   . Stroke Brother   . Alcohol abuse Paternal Grandfather   . Osteoporosis Mother   . Breast cancer Neg Hx     Social History Social History   Tobacco Use  . Smoking status: Current Every Day Smoker    Packs/day: 1.00    Years: 32.00    Pack years: 32.00    Types: Cigarettes  . Smokeless tobacco: Never Used  Substance Use Topics  . Alcohol use: No    Alcohol/week: 0.0 standard drinks  . Drug use: No    Comment:  history of cocaine use, clean for 3.5 years     Review of Systems  Constitutional: No fever/chills Eyes: No visual changes. ENT: No sore throat. Cardiovascular: Denies chest pain. Respiratory: Denies shortness of breath. Gastrointestinal: Diffuse abdominal pain.  No nausea, no  vomiting.  No diarrhea.   Genitourinary: Negative for dysuria. Musculoskeletal: Negative for back pain. Skin: Negative for rash. Neurological: Negative for headaches, focal weakness   ____________________________________________   PHYSICAL EXAM:  VITAL SIGNS: ED Triage Vitals  Enc Vitals Group     BP 07/29/18 2212 (!) 181/158     Pulse Rate 07/29/18 2212 (!) 107     Resp 07/29/18 2212 18     Temp 07/29/18 2212 97.7 F (36.5 C)     Temp Source 07/29/18 2212 Oral     SpO2 07/29/18 2211 98 %     Weight 07/29/18 2213 176 lb 5.9 oz (80 kg)     Height 07/29/18 2213 5\' 8"  (1.727 m)     Head Circumference --      Peak Flow --      Pain Score 07/29/18 2212 10     Pain Loc --      Pain Edu? --      Excl. in GC? --     Constitutional: Alert and oriented. Well appearing and in no acute distress. Eyes: Conjunctivae are normal.  Head: Atraumatic. Nose: No congestion/rhinnorhea. Mouth/Throat: Mucous membranes are moist.  Oropharynx non-erythematous. Neck: No stridor.   Cardiovascular: Normal rate, regular rhythm. Grossly normal heart sounds.  Good peripheral circulation. Respiratory: Normal respiratory effort.  No retractions. Lungs CTAB. Gastrointestinal: Soft mildly diffusely tender to palpation. No distention. No abdominal bruits. No CVA tenderness. Musculoskeletal: No lower extremity tenderness nor edema.   Neurologic:  Normal speech and language. No gross focal neurologic deficits are appreciated.. Skin:  Skin is warm, dry and intact. No rash noted.   ____________________________________________   LABS (all labs ordered are listed, but only abnormal results are displayed)  Labs Reviewed  CBC  WITH DIFFERENTIAL/PLATELET - Abnormal; Notable for the following components:      Result Value   WBC 14.5 (*)    Platelets 563 (*)    Neutro Abs 10.4 (*)    Basophils Absolute 0.2 (*)    All other components within normal limits  COMPREHENSIVE METABOLIC PANEL - Abnormal; Notable for the following components:   Glucose, Bld 109 (*)    Calcium 10.8 (*)    Total Protein 8.3 (*)    AST 47 (*)    Alkaline Phosphatase 223 (*)    All other components within normal limits  ACETAMINOPHEN LEVEL - Abnormal; Notable for the following components:   Acetaminophen (Tylenol), Serum <10 (*)    All other components within normal limits  ETHANOL  SALICYLATE LEVEL  URINALYSIS, COMPLETE (UACMP) WITH MICROSCOPIC  URINE DRUG SCREEN, QUALITATIVE ([facility] ONLY)  COMPREHENSIVE METABOLIC PANEL  CBC WITH DIFFERENTIAL/PLATELET  LIPASE, BLOOD   ____________________________________________  EKG EKG read and interpreted by me shows normal sinus rhythm rate of 89 normal axis there are flipped T waves in V2 and 3 which appear to be new from prior EKGs patient denies any chest pain  ____________________________________________  RADIOLOGY  ED MD interpretation: Ultrasound read by radiology reviewed by me only shows hepatic steatosis  Official radiology report(s): No results found.  ____________________________________________   PROCEDURES  Procedure(s) performed:   Procedures  Critical Care performed:  ____________________________________________   INITIAL IMPRESSION / ASSESSMENT AND PLAN / ED COURSE   Patient is no longer tachycardic she feels better she is taking her medicines.  Tele-psychiatry has seen her in reverse the commitment.  They recommend following up with her outpatient psychiatry decrease Klonopin to 0.5 mg 3 times a day.  Troponin is  negative patient is pain-free i she never had any chest pain we will let her go.     Clinical Course as of Jul 31 1353  Sat Jul 31, 2018  1302  Creatinine: 0.83 [PM]  1306 WBC(!): 11.0 [PM]  1344 CO2: 24 [PM]    Clinical Course User Index [PM] Arnaldo Natal, MD     ____________________________________________   FINAL CLINICAL IMPRESSION(S) / ED DIAGNOSES  Final diagnoses:  Paranoia (HCC)  Chronic abdominal pain     ED Discharge Orders    None       Note:  This document was prepared using Dragon voice recognition software and may include unintentional dictation errors.    Arnaldo Natal, MD 07/31/18 1420

## 2018-07-31 NOTE — ED Notes (Signed)
Patient is on the phone, no behavioral issues, she is teary eyed talking to her family.

## 2018-07-31 NOTE — ED Notes (Signed)
Nurse changed dressing to abdominal area, wet to dry,, red, pink incision, no fowl smell, or discolored drainage noted, applied abd pad over packing, and secured with paper tape. Patient tolerated without complaints. Patient states that she had colostomy reversal awhile back, and she thought she had colostomy due to surgery for her diverticulosis, Patient is calm and cooperative.

## 2018-07-31 NOTE — ED Notes (Signed)
Pt verbalized understanding of discharge instructions. NAD at this time. 

## 2018-07-31 NOTE — ED Notes (Signed)
Patient took all medications without hesitation, nurse did explain each one, she was compliant, Patient admitted to being paranoid yesterday, states that she had stopped some of her medications and she should not have, she states that her issues started years ago when her husband left, she denies Si/hi or avh at this time, will continue to monitor.

## 2018-07-31 NOTE — ED Notes (Signed)
Patient transported to Ultrasound 

## 2018-07-31 NOTE — ED Notes (Addendum)
Nurse reported to Dr.Faheem prior to Patient's evaluation per phone.

## 2018-07-31 NOTE — Discharge Instructions (Addendum)
Please follow-up with your psychiatrist and therapist.  Our tele-psychiatry doctor recommends decreasing the Klonopin to 0.5 mg 3 times a day.  Please continue your other medicines and please return for any further problems.

## 2018-07-31 NOTE — ED Notes (Signed)
Signature pad not working. Hard copy printed and signed by patient.  

## 2018-07-31 NOTE — ED Notes (Signed)
Patient transferred to quad per Dr. Darnelle Catalan due to increase pulse, b/p, to monitor, nurse gave report to Shea Stakes nurse, awaiting [facility], Nurse let quad nurse know that camera check had been done for [facility], awaiting psychiatrist to call for evaluation, Patient transferred with police and tech escort.

## 2018-07-31 NOTE — ED Notes (Signed)
Nurse talked to MD regarding increase heart rate, b/p and pain from incision, He said to give morning medications and to recheck vitals and if things haven't changed then she would be sent back over to the quad for medical, nurse will monitor.

## 2018-07-31 NOTE — Consult Note (Signed)
[facility] Consult   Reason for Consult:  Paranoia Referring Physician:  ED  Patient Identification: Sabrina Espinoza MRN:  [id number] Principal Diagnosis: Bipolar 1 disorder, mixed, partial remission (HCC) Diagnosis:  Principal Problem:   Bipolar 1 disorder, mixed, partial remission (HCC)   Total Time spent with patient: 30 minutes  Subjective:   Sabrina Espinoza is a 53 y.o. female patient admitted with history of bipolar disorder. [name].  HPI:  Sabrina Espinoza is an 53 y.o. female who presents to the ER initially due to stomach pain.  Reportedly patient called the ambulance for her mother, however when the line enforcement came patient presented paranoid. As per prescreen by TTS, "During the interview, the patient was paranoid and guarded. She provided limited answers to the questions. She continued to ask this Clinical research associatewriter, who he was and why he was talking with her. Initially, she would not provide Clinical research associatewriter with any contact information of her family or natural supports. She stated, there were codes that had to be used in order to get the information writer was requesting. Patient states, she receives [facility]. She's under the psychiatric care of Dr. [name]. She receives medication management".   Patient assessed in the ED.  During the assessment she is alert, oriented to place and person.  Was able to say a year 2020 and month January however missed the date.  Patient reports history of bipolar disorder and PTSD.  Patient reports noncompliance to medication and feeling paranoid that other people are messing with her staff.  She reports after she is in the ER and restarted on her home medication she is much improved.  She admits to feeling depression and anxiety but relates her mood symptoms and anxiety for being here in the ER as she wants to go home.  She denies any suicidal/homicidal ideations, intents or plans.  Denies any previous suicide attempts.   He denies any auditory/visual hallucinations.  During assessment today, there is no evidence of delusion or paranoia.  Patient reports seeing [name] and her counselor Elita QuickCheryl Lawson on every Friday.  She was able to tell me her medications including Lamictal, Klonopin, prazosin and gabapentin.  She denies any alcohol or other drug use.    Past Psychiatric History: Bipolar disorder, PTSD  Risk to Self: Suicidal Ideation: No Suicidal Intent: No Is patient at risk for suicide?: No Suicidal Plan?: No Access to Means: No What has been your use of drugs/alcohol within the last 12 months?: Reports of none How many times?: 0 Other Self Harm Risks: Reports of none Triggers for Past Attempts: None known Intentional Self Injurious Behavior: None Risk to Others: Homicidal Ideation: No Thoughts of Harm to Others: No Current Homicidal Intent: No Current Homicidal Plan: No Access to Homicidal Means: No Identified Victim: Reports of none History of harm to others?: No Assessment of Violence: None Noted Violent Behavior Description: Reports of none Does patient have access to weapons?: No Criminal Charges Pending?: No Does patient have a court date: No Prior Inpatient Therapy: Prior Inpatient Therapy: No Prior Outpatient Therapy: Prior Outpatient Therapy: Yes Prior Therapy Dates: Current Prior Therapy Facilty[facility]Provider[facility] Reason for Treatment: Medication Management Does patient have an ACCT team?: No Does patient have Intensive In-House Services?  : No Does patient have [facility] services? : No Does patient have P4CC services?: No  Past Medical History:  Past Medical History:  Diagnosis Date  . Anxiety   . Arthritis  neck  . Bipolar disorder (HCC)   . Chronic constipation    on Linzess, Miralax, Zofran  . Chronic insomnia 11/02/2015  . COPD, moderate (HCC)   . Depression   . Depression with anxiety    Followed by Dr. Lynett Fish, psychiatrist at  [facility] and Wilma Flavin for thrapy  . GERD (gastroesophageal reflux disease)   . Hepatitis    Hep C, history, has been treated and cured per pt.  . History of chicken pox   . History of cocaine use   . History of hiatal hernia   . History of pneumonia   . Hypothyroidism, adult    Working with Dr. Horton Chin, Endocrinology. S/P RAIU and is planning on biopsy  . Migraine without status migrainosus, not intractable   . Nicotine dependence, uncomplicated   . Nontoxic multinodular goiter    FNA done 10/30/14 with benign results. Seen [name]. Patient may want a second opinion.  . Rhinitis, allergic   . Vitamin D deficiency     Past Surgical History:  Procedure Laterality Date  . ANKLE FRACTURE SURGERY  1988   rods and pins in place  . APPENDECTOMY  04/17/2016   Procedure: APPENDECTOMY;  Surgeon: Leafy Ro, MD;  Location: [facility] ORS;  Service: General;;  . CARPAL TUNNEL RELEASE Right 11/24/2016   Procedure: CARPAL TUNNEL RELEASE;  Surgeon: Deeann Saint, MD;  Location: [facility] ORS;  Service: Orthopedics;  Laterality: Right;  . CESAREAN SECTION  1996  . COLECTOMY WITH COLOSTOMY CREATION/HARTMANN PROCEDURE N/A 04/17/2016   Procedure: COLOSTOMY CREATION/HARTMANN PROCEDURE;  Surgeon: Leafy Ro, MD;  Location: [facility] ORS;  Service: General;  Laterality: N/A;  . ECTOPIC PREGNANCY SURGERY  2003  . LAPAROTOMY N/A 04/17/2016   Procedure: EXPLORATORY LAPAROTOMY;  Surgeon: Leafy Ro, MD;  Location: [facility] ORS;  Service: General;  Laterality: N/A;  . NECK SURGERY     Family History:  Family History  Problem Relation Age of Onset  . Heart disease Father   . Alcohol abuse Father   . Diabetes Father   . Heart disease Brother   . Depression Brother   . Stroke Brother   . Alcohol abuse Paternal Grandfather   . Osteoporosis Mother   . Breast cancer Neg Hx    Family Psychiatric  History: Denies Social History:  Social History   Substance and Sexual Activity   Alcohol Use No  . Alcohol/week: 0.0 standard drinks     Social History   Substance and Sexual Activity  Drug Use No   Comment: history of cocaine use, clean for 3.5 years     Social History   Socioeconomic History  . Marital status: Single    Spouse name: Not on file  . Number of children: Not on file  . Years of education: Not on file  . Highest education level: Not on file  Occupational History  . Not on file  Social Needs  . Financial resource strain: Not on file  . Food insecurity:    Worry: Not on file    Inability: Not on file  . Transportation needs:    Medical: Not on file    Non-medical: Not on file  Tobacco Use  . Smoking status: Current Every Day Smoker    Packs/day: 1.00    Years: 32.00    Pack years: 32.00    Types: Cigarettes  . Smokeless tobacco: Never Used  Substance and Sexual Activity  . Alcohol use: No    Alcohol/week:  0.0 standard drinks  . Drug use: No    Comment: history of cocaine use, clean for 3.5 years   . Sexual activity: Never  Lifestyle  . Physical activity:    Days per week: Not on file    Minutes per session: Not on file  . Stress: Not on file  Relationships  . Social connections:    Talks on phone: Not on file    Gets together: Not on file    Attends religious service: Not on file    Active member of club or organization: Not on file    Attends meetings of clubs or organizations: Not on file    Relationship status: Not on file  Other Topics Concern  . Not on file  Social History Narrative  . Not on file   Additional Social History: lives by self.  Patient reports good support around her, family regularly checking on her and wound care coming home twice a week.  Also reports good relationship with her counselor Elita Quick, who she sees every Friday.    Allergies:  No Known Allergies  Labs:  Results for orders placed or performed during the hospital encounter of 07/29/18 (from the past 48 hour(s))  CBC with  Differential     Status: Abnormal   Collection Time: 07/29/18 10:19 PM  Result Value Ref Range   WBC 14.5 (H) 4.0 - 10.5 K/uL   RBC 4.21 3.87 - 5.11 MIL/uL   Hemoglobin 12.3 12.0 - 15.0 g/dL   HCT 16.1 09.6 - 04.5 %   MCV 88.6 80.0 - 100.0 fL   MCH 29.2 26.0 - 34.0 pg   MCHC 33.0 30.0 - 36.0 g/dL   RDW 40.9 81.1 - 91.4 %   Platelets 563 (H) 150 - 400 K/uL   nRBC 0.0 0.0 - 0.2 %   Neutrophils Relative % 71 %   Neutro Abs 10.4 (H) 1.7 - 7.7 K/uL   Lymphocytes Relative 19 %   Lymphs Abs 2.8 0.7 - 4.0 K/uL   Monocytes Relative 7 %   Monocytes Absolute 0.9 0.1 - 1.0 K/uL   Eosinophils Relative 1 %   Eosinophils Absolute 0.1 0.0 - 0.5 K/uL   Basophils Relative 1 %   Basophils Absolute 0.2 (H) 0.0 - 0.1 K/uL   Immature Granulocytes 1 %   Abs Immature Granulocytes 0.07 0.00 - 0.07 K/uL    Comment: Performed at [facility], [address] [place], [place] [address]  Comprehensive metabolic panel     Status: Abnormal   Collection Time: 07/29/18 10:19 PM  Result Value Ref Range   Sodium 138 135 - 145 mmol/L   Potassium 3.6 3.5 - 5.1 mmol/L   Chloride 104 98 - 111 mmol/L   CO2 25 22 - 32 mmol/L   Glucose, Bld 109 (H) 70 - 99 mg/dL   BUN 6 6 - 20 mg/dL   Creatinine, Ser 6.21 0.44 - 1.00 mg/dL   Calcium 30.8 (H) 8.9 - 10.3 mg/dL   Total Protein 8.3 (H) 6.5 - 8.1 g/dL   Albumin 4.2 3.5 - 5.0 g/dL   AST 47 (H) 15 - 41 U/L   ALT 28 0 - 44 U/L   Alkaline Phosphatase 223 (H) 38 - 126 U/L   Total Bilirubin 0.5 0.3 - 1.2 mg/dL   GFR calc non Af Amer >60 >60 mL/min   GFR calc Af Amer >60 >60 mL/min   Anion gap 9 5 - 15    Comment: Performed  at [facility], [address] [place], [place] [address]  Ethanol     Status: None   Collection Time: 07/29/18 10:19 PM  Result Value Ref Range   Alcohol, Ethyl (B) <10 <10 mg/dL    Comment: (NOTE) Lowest detectable limit for serum alcohol is 10 mg/dL. For medical purposes only. Performed at [facility], [address] [place], [place] [address]   Salicylate level     Status: None   Collection Time: 07/29/18 10:19 PM  Result Value Ref Range   Salicylate Lvl <7.0 2.8 - 30.0 mg/dL    Comment: Performed at [facility], [address] [place], [place] [address]  Acetaminophen level     Status: Abnormal   Collection Time: 07/29/18 10:19 PM  Result Value Ref Range   Acetaminophen (Tylenol), Serum <10 (L) 10 - 30 ug/mL    Comment: (NOTE) Therapeutic concentrations vary significantly. A range of 10-30 ug/mL  may be an effective concentration for many patients. However, some  are best treated at concentrations outside of this range. Acetaminophen concentrations >150 ug/mL at 4 hours after ingestion  and >50 ug/mL at 12 hours after ingestion are often associated with  toxic reactions. Performed at [facility], [address] [place], [place] [address]   Comprehensive metabolic panel     Status: Abnormal   Collection Time: 07/31/18 11:46 AM  Result Value Ref Range   Sodium 134 (L) 135 - 145 mmol/L   Potassium 3.2 (L) 3.5 - 5.1 mmol/L   Chloride 102 98 - 111 mmol/L   CO2 24 22 - 32 mmol/L   Glucose, Bld 119 (H) 70 - 99 mg/dL   BUN 7 6 - 20 mg/dL   Creatinine, Ser 2.95 0.44 - 1.00 mg/dL   Calcium 9.9 8.9 - 62.1 mg/dL   Total Protein 7.2 6.5 - 8.1 g/dL   Albumin 3.7 3.5 - 5.0 g/dL   AST 68 (H) 15 - 41 U/L   ALT 34 0 - 44 U/L   Alkaline Phosphatase 188 (H) 38 - 126 U/L   Total Bilirubin 0.5 0.3 - 1.2 mg/dL   GFR calc non Af Amer >60 >60 mL/min   GFR calc Af Amer >60 >60 mL/min   Anion gap 8 5 - 15    Comment: Performed at [facility], [address] [place], [place] [address]  CBC with Differential     Status: Abnormal   Collection Time: 07/31/18 11:46 AM  Result Value Ref Range   WBC 11.0 (H) 4.0 - 10.5 K/uL   RBC 3.89 3.87 - 5.11 MIL/uL   Hemoglobin 11.3 (L) 12.0 - 15.0 g/dL   HCT 78.4 (L) 69.6 - 29.5 %   MCV 87.9 80.0 - 100.0 fL   MCH 29.0 26.0  - 34.0 pg   MCHC 33.0 30.0 - 36.0 g/dL   RDW 28.4 13.2 - 44.0 %   Platelets 441 (H) 150 - 400 K/uL   nRBC 0.0 0.0 - 0.2 %   Neutrophils Relative % 62 %   Neutro Abs 6.8 1.7 - 7.7 K/uL   Lymphocytes Relative 25 %   Lymphs Abs 2.8 0.7 - 4.0 K/uL   Monocytes Relative 9 %   Monocytes Absolute 1.0 0.1 - 1.0 K/uL   Eosinophils Relative 2 %   Eosinophils Absolute 0.2 0.0 - 0.5 K/uL   Basophils Relative 1 %   Basophils Absolute 0.1 0.0 - 0.1 K/uL   Immature Granulocytes 1 %  Abs Immature Granulocytes 0.05 0.00 - 0.07 K/uL    Comment: Performed at [facility], [address] [place], [place] [address]  Lipase, blood     Status: None   Collection Time: 07/31/18 11:46 AM  Result Value Ref Range   Lipase 39 11 - 51 U/L    Comment: Performed at [facility], [address] [place], [place] [address]  Troponin I - Add-On to previous collection     Status: None   Collection Time: 07/31/18 11:46 AM  Result Value Ref Range   Troponin I <0.03 <0.03 ng/mL    Comment: Performed at [facility], [name] [place], [place] [address]    Current Facility-Administered Medications  Medication Dose Route Frequency Provider Last Rate Last Dose  . acetaminophen (TYLENOL) tablet 1,000 mg  1,000 mg Oral Q6H PRN Sharman CheekStafford, Phillip, MD   1,000 mg at 07/31/18 0841  . albuterol (PROVENTIL HFA;VENTOLIN HFA) 108 (90 Base) MCG/ACT inhaler 2 puff  2 puff Inhalation Q6H PRN Sharman CheekStafford, Phillip, MD      . amoxicillin (AMOXIL) capsule 500 mg  500 mg Oral Q8H Sharman CheekStafford, Phillip, MD   500 mg at 07/30/18 2147  . Brexpiprazole TABS 4 mg  4 mg Oral Daily Sharman CheekStafford, Phillip, MD   4 mg at 07/31/18 [phone]  . clonazePAM (KLONOPIN) tablet 1 mg  1 mg Oral TID Sharman CheekStafford, Phillip, MD   1 mg at 07/31/18 [phone]  . diltiazem (CARDIZEM CD) 24 hr capsule 180 mg  180 mg Oral Daily Sharman CheekStafford, Phillip, MD   180 mg at 07/31/18 0850  . fluticasone (FLONASE) 50 MCG/ACT nasal spray 1 spray  1 spray Each Nare BID  Sharman CheekStafford, Phillip, MD   1 spray at 07/31/18 [phone]  . gabapentin (NEURONTIN) capsule 400 mg  400 mg Oral TID Sharman CheekStafford, Phillip, MD   400 mg at 07/31/18 0841  . lamoTRIgine (LAMICTAL) tablet 300 mg  300 mg Oral Daily Sharman CheekStafford, Phillip, MD   300 mg at 07/31/18 [phone]  . levothyroxine (SYNTHROID, LEVOTHROID) tablet 75 mcg  75 mcg Oral Daily Sharman CheekStafford, Phillip, MD      . Melatonin TABS 10 mg  10 mg Oral Nightly Sharman CheekStafford, Phillip, MD      . metoprolol succinate (TOPROL-XL) 24 hr tablet 50 mg  50 mg Oral Daily Sharman CheekStafford, Phillip, MD   50 mg at 07/31/18 0841  . mometasone-formoterol (DULERA) 200-5 MCG/ACT inhaler 2 puff  2 puff Inhalation BID Sharman CheekStafford, Phillip, MD   2 puff at 07/30/18 2149  . montelukast (SINGULAIR) tablet 10 mg  10 mg Oral QHS Sharman CheekStafford, Phillip, MD   10 mg at 07/30/18 2147  . nortriptyline (PAMELOR) capsule 100 mg  100 mg Oral QHS Sharman CheekStafford, Phillip, MD   100 mg at 07/30/18 2148  . ondansetron (ZOFRAN) tablet 4 mg  4 mg Oral Q6H PRN Sharman CheekStafford, Phillip, MD      . pantoprazole (PROTONIX) EC tablet 40 mg  40 mg Oral Daily Sharman CheekStafford, Phillip, MD   40 mg at 07/31/18 0843  . prazosin (MINIPRESS) capsule 5 mg  5 mg Oral QHS Sharman CheekStafford, Phillip, MD   5 mg at 07/30/18 2146  . rOPINIRole (REQUIP) tablet 2 mg  2 mg Oral BID Sharman CheekStafford, Phillip, MD   2 mg at 07/31/18 0849  . topiramate (TOPAMAX) tablet 100 mg  100 mg Oral Daily Sharman CheekStafford, Phillip, MD   100 mg at 07/31/18 [phone]   Current Outpatient Medications  Medication Sig Dispense Refill  . amoxicillin (AMOXIL) 500 MG  capsule Take 500 mg by mouth every 8 (eight) hours.    . Brexpiprazole 4 MG TABS Take 4 mg by mouth daily.    . budesonide-formoterol (SYMBICORT) 160-4.5 MCG/ACT inhaler Inhale 2 puffs into the lungs 2 (two) times daily. USE 2 PUFFS 2 TIMES A DAY (Patient taking differently: Inhale 2 puffs into the lungs 2 (two) times daily. ) 10.2 Inhaler 2  . clonazePAM (KLONOPIN) 1 MG tablet Take 1 mg by mouth 3 (three) times daily.     . fluticasone  (FLONASE) 50 MCG/ACT nasal spray Place 1 spray into both nostrils 2 (two) times daily. 16 g 11  . gabapentin (NEURONTIN) 400 MG capsule Take 400 mg by mouth 3 (three) times daily.     . hydrOXYzine (VISTARIL) 50 MG capsule Take 50 mg by mouth 2 (two) times daily.    [name] lamoTRIgine (LAMICTAL) 150 MG tablet Take 300 mg by mouth daily.    [name] levocetirizine (XYZAL) 5 MG tablet Take 5 mg by mouth daily.    . Melatonin 5 MG CAPS Take 10 mg by mouth Nightly.    . metoprolol succinate (TOPROL-XL) 50 MG 24 hr tablet Take 50 mg by mouth daily.    . montelukast (SINGULAIR) 10 MG tablet TAKE 1 TABLET (10 MG TOTAL) BY MOUTH AT BEDTIME. 30 tablet 6  . nortriptyline (PAMELOR) 50 MG capsule Take 100 mg by mouth at bedtime.     . prazosin (MINIPRESS) 5 MG capsule Take 5 mg by mouth at bedtime.    [name] rOPINIRole (REQUIP) 2 MG tablet Take 2 mg by mouth 2 (two) times daily.    [name] SYNTHROID 75 MCG tablet Take 75 mcg by mouth daily.    [name] topiramate (TOPAMAX) 100 MG tablet Take 100 mg by mouth daily.    . traMADol (ULTRAM) 50 MG tablet Take 50 mg by mouth every 6 (six) hours as needed for pain.  1  . acetaminophen (TYLENOL) 500 MG tablet Take 1,000 mg by mouth every 6 (six) hours as needed for moderate pain or headache.    . Aspirin-Salicylamide-Caffeine (BC HEADACHE PO) Take 1 packet by mouth 3 (three) times daily as needed (headache).    [name] azelastine (ASTELIN) 0.1 % nasal spray Place 1 spray into the nose 2 (two) times daily. Use in each nostril as directed     . butalbital-acetaminophen-caffeine (FIORICET, ESGIC) 50-325-40 MG tablet Take 1 tablet by mouth every 4 (four) hours as needed for headache.     . diltiazem (CARDIZEM CD) 180 MG 24 hr capsule Take 1 capsule (180 mg total) by mouth daily. (Patient not taking: Reported on 07/30/2018) 30 capsule 1  . EPIPEN 2-PAK 0.3 MG/0.3ML SOAJ injection Inject 0.3 mg into the muscle as needed (allergic reaction).     [name] HYDROcodone-acetaminophen (NORCO) 5-325 MG tablet Take 1  tablet by mouth every 6 (six) hours as needed. (Patient not taking: Reported on 07/30/2018) 40 tablet 0  . meloxicam (MOBIC) 15 MG tablet Take 1 tablet (15 mg total) by mouth daily. (Patient not taking: Reported on 07/30/2018) 30 tablet 3  . ondansetron (ZOFRAN) 4 MG tablet Take 4 mg by mouth every 6 (six) hours as needed for nausea or vomiting.    . pantoprazole (PROTONIX) 40 MG tablet Take 40 mg by mouth daily.    . polyethylene glycol powder (GLYCOLAX/MIRALAX) powder Take 17 g by mouth daily as needed for mild constipation. (Patient taking differently: Take 17 g by mouth 2 (two) times daily. ) 527 g 2  .  promethazine (PHENERGAN) 25 MG tablet Take 25 mg by mouth every 6 (six) hours as needed for nausea or vomiting.    . Suvorexant (BELSOMRA) 20 MG TABS Take 20 mg by mouth at bedtime.    [name] tiZANidine (ZANAFLEX) 4 MG tablet Take 2 mg by mouth daily.    . Tolnaftate (ABSORBINE JR EX) Apply 1 application topically daily as needed (pain).    . VENTOLIN HFA 108 (90 Base) MCG/ACT inhaler Inhale 2 puffs into the lungs every 4 (four) hours as needed for wheezing or shortness of breath. (Patient taking differently: Inhale 2 puffs into the lungs every 6 (six) hours as needed for wheezing or shortness of breath. ) 1 Inhaler 1    Musculoskeletal: Strength & Muscle Tone: decreased Gait & Station: lying in hospital bed Patient leans: N/A  Psychiatric Specialty Exam: Physical Exam  Nursing note and vitals reviewed.   ROS- negative other than in HPI  Blood pressure 128/88, pulse (!) 102, temperature 98 F (36.7 C), resp. rate 18, height 5\' 8"  (1.727 m), weight 80 kg, SpO2 100 %.Body mass index is 26.82 kg/m.  General Appearance: Casual  Eye Contact:  Fair  Speech:  Normal Rate  Volume:  Normal  Mood:  "I want to go home"  Affect:  Congruent  Thought Process:  Goal Directed  Orientation:  Full (Time, Place, and Person)  Thought Content:  Logical  Suicidal Thoughts:  No  Homicidal Thoughts:  No   Memory:  Recent;   Fair  Judgement:  Fair  Insight:  Fair  Psychomotor Activity:  Decreased  Concentration:  Concentration: Fair  Recall:  [employer] of Knowledge:  Fair  Language:  Fair  Akathisia:  No  Handed:  Right  AIMS (if indicated):     Assets:  Communication Skills Desire for Improvement Housing Social Support  ADL's:  Intact  Cognition:  WNL  Sleep:        Treatment Plan Summary: Medication management  Disposition: No evidence of imminent risk to self or others at present.     53 years old-female with past psychiatric history of bipolar disorder and PTSD came to ER with increased paranoia due to partial noncompliance of medications.  However, her symptoms improved after restarting home medications.  She denied any suicidal/homicidal ideations, intents or plans.  Due to her recent noncompliance of medications and increased paranoia, patient was offered short term inpatient psychiatric hospitalization, which she agreed to.  However,pt seen by Tele-psychiatry,  reverse the commitment.  They recommend following up with her outpatient psychiatry and pt discharged by the ED physician.  Santo Held, MD 07/31/2018 4:08 PM

## 2018-07-31 NOTE — ED Notes (Signed)
Patient refused shower at this time, or wash up, will offer again before lunch.

## 2018-08-23 ENCOUNTER — Other Ambulatory Visit: Payer: Self-pay | Admitting: Family Medicine

## 2018-08-23 DIAGNOSIS — Z1231 Encounter for screening mammogram for malignant neoplasm of breast: Secondary | ICD-10-CM

## 2018-10-21 DIAGNOSIS — F333 Major depressive disorder, recurrent, severe with psychotic symptoms: Secondary | ICD-10-CM | POA: Insufficient documentation

## 2018-12-25 ENCOUNTER — Other Ambulatory Visit: Payer: Self-pay

## 2018-12-25 ENCOUNTER — Encounter: Payer: Self-pay | Admitting: Emergency Medicine

## 2018-12-25 DIAGNOSIS — Z7982 Long term (current) use of aspirin: Secondary | ICD-10-CM | POA: Diagnosis not present

## 2018-12-25 DIAGNOSIS — J449 Chronic obstructive pulmonary disease, unspecified: Secondary | ICD-10-CM | POA: Insufficient documentation

## 2018-12-25 DIAGNOSIS — Z79899 Other long term (current) drug therapy: Secondary | ICD-10-CM | POA: Insufficient documentation

## 2018-12-25 DIAGNOSIS — K5900 Constipation, unspecified: Secondary | ICD-10-CM | POA: Insufficient documentation

## 2018-12-25 DIAGNOSIS — F17210 Nicotine dependence, cigarettes, uncomplicated: Secondary | ICD-10-CM | POA: Diagnosis not present

## 2018-12-25 DIAGNOSIS — N3001 Acute cystitis with hematuria: Secondary | ICD-10-CM | POA: Insufficient documentation

## 2018-12-25 DIAGNOSIS — R1032 Left lower quadrant pain: Secondary | ICD-10-CM | POA: Diagnosis present

## 2018-12-25 LAB — COMPREHENSIVE METABOLIC PANEL
ALT: 13 U/L (ref 0–44)
AST: 20 U/L (ref 15–41)
Albumin: 3.5 g/dL (ref 3.5–5.0)
Alkaline Phosphatase: 128 U/L — ABNORMAL HIGH (ref 38–126)
Anion gap: 13 (ref 5–15)
BUN: 6 mg/dL (ref 6–20)
CO2: 22 mmol/L (ref 22–32)
Calcium: 9.5 mg/dL (ref 8.9–10.3)
Chloride: 100 mmol/L (ref 98–111)
Creatinine, Ser: 0.79 mg/dL (ref 0.44–1.00)
GFR calc Af Amer: >60 mL/min (ref >60)
GFR calc non Af Amer: >60 mL/min (ref >60)
Glucose, Bld: 119 mg/dL — ABNORMAL HIGH (ref 70–99)
Potassium: 3.4 mmol/L — ABNORMAL LOW (ref 3.5–5.1)
Sodium: 135 mmol/L (ref 135–145)
Total Bilirubin: 0.2 mg/dL — ABNORMAL LOW (ref 0.3–1.2)
Total Protein: 6.9 g/dL (ref 6.5–8.1)

## 2018-12-25 LAB — URINALYSIS, COMPLETE (UACMP) WITH MICROSCOPIC
Bilirubin Urine: NEGATIVE
Glucose, UA: NEGATIVE mg/dL
Hgb urine dipstick: NEGATIVE
Ketones, ur: NEGATIVE mg/dL
Nitrite: POSITIVE — AB
Protein, ur: NEGATIVE mg/dL
Specific Gravity, Urine: 1.014 (ref 1.005–1.030)
WBC, UA: >50 WBC/hpf — ABNORMAL HIGH (ref 0–5)
pH: 8 (ref 5.0–8.0)

## 2018-12-25 LAB — CBC
HCT: 34.5 % — ABNORMAL LOW (ref 36.0–46.0)
Hemoglobin: 11.1 g/dL — ABNORMAL LOW (ref 12.0–15.0)
MCH: 28.2 pg (ref 26.0–34.0)
MCHC: 32.2 g/dL (ref 30.0–36.0)
MCV: 87.8 fL (ref 80.0–100.0)
Platelets: 364 10*3/uL (ref 150–400)
RBC: 3.93 MIL/uL (ref 3.87–5.11)
RDW: 14.5 % (ref 11.5–15.5)
WBC: 11.7 10*3/uL — ABNORMAL HIGH (ref 4.0–10.5)
nRBC: 0 % (ref 0.0–0.2)

## 2018-12-25 LAB — LIPASE, BLOOD: Lipase: 29 U/L (ref 11–51)

## 2018-12-25 NOTE — ED Triage Notes (Signed)
Pt arrives via [facility] with c/o emesis and diarrhea. Pt states that she had a colostomy revision in October and has been hurting since. Pt states that last week she was diagnosed with MRSA and has been taking her medications. Pt is in NAD.

## 2018-12-25 NOTE — ED Notes (Signed)
Patient to waiting room via wheelchair by EMS.  EMS reports patient with nausea, vomiting diarrhea and abdominal pain.  Reports had colostomy reversed in December 2019. EMS vital signs:  BP - 108/68,  P - 80's, pulse oxi 96-97% room air.

## 2018-12-26 ENCOUNTER — Emergency Department: Payer: 59

## 2018-12-26 ENCOUNTER — Emergency Department
Admission: EM | Admit: 2018-12-26 | Discharge: 2018-12-26 | Disposition: A | Discharge status: Home / Self Care | Payer: 59 | Attending: Emergency Medicine | Admitting: Emergency Medicine

## 2018-12-26 ENCOUNTER — Encounter: Payer: Self-pay | Admitting: Radiology

## 2018-12-26 DIAGNOSIS — K5900 Constipation, unspecified: Secondary | ICD-10-CM

## 2018-12-26 DIAGNOSIS — N3001 Acute cystitis with hematuria: Secondary | ICD-10-CM | POA: Diagnosis not present

## 2018-12-26 HISTORY — DX: Unspecified asthma, uncomplicated: J45.909

## 2018-12-26 HISTORY — DX: Essential (primary) hypertension: I10

## 2018-12-26 MED ORDER — IOHEXOL 300 MG/ML  SOLN
100.0000 mL | Freq: Once | INTRAMUSCULAR | Status: AC | PRN
  Administered 2018-12-26: 02:00:00 100 mL via INTRAVENOUS
  Filled 2018-12-26: qty 100

## 2018-12-26 MED ORDER — CEPHALEXIN 500 MG PO CAPS: 500.0000 mg | ORAL_CAPSULE | Freq: Three times a day (TID) | ORAL | 0 refills | Status: AC

## 2018-12-26 MED ORDER — ONDANSETRON HCL 4 MG/2ML IJ SOLN
4.0000 mg | Freq: Once | INTRAMUSCULAR | Status: AC
  Administered 2018-12-26: 01:00:00 4 mg via INTRAVENOUS
  Filled 2018-12-26: qty 2

## 2018-12-26 MED ORDER — SODIUM CHLORIDE 0.9 % IV SOLN
1.0000 g | Freq: Once | INTRAVENOUS | Status: AC
  Administered 2018-12-26: 01:00:00 1 g via INTRAVENOUS
  Filled 2018-12-26: qty 10

## 2018-12-26 MED ORDER — FENTANYL CITRATE (PF) 100 MCG/2ML IJ SOLN
100.0000 ug | Freq: Once | INTRAMUSCULAR | Status: AC
  Administered 2018-12-26: 100 ug via INTRAVENOUS
  Filled 2018-12-26: qty 2

## 2018-12-26 MED ORDER — SODIUM CHLORIDE 0.9 % IV BOLUS
1000.0000 mL | Freq: Once | INTRAVENOUS | Status: AC
  Administered 2018-12-26: 1000 mL via INTRAVENOUS

## 2018-12-26 NOTE — Discharge Instructions (Signed)
Constipation: Take colace twice a day everyday. Take senna once a day at bedtime. Take daily probiotics. Drink plenty of fluids and eat a diet rich in fiber. For the next 3 days take 3600mg or 45ml of 400mg/5ml Milk of Magnesia at bedtime.  ° °

## 2018-12-26 NOTE — ED Provider Notes (Signed)
[facility] Emergency Department Provider Note  ____________________________________________  Time seen: Approximately 1:08 AM  I have reviewed the triage vital signs and the nursing notes.   HISTORY  Chief Complaint Abdominal Pain   HPI Sabrina Espinoza is a 53 y.o. female with a history of chronic pain on Suboxone, chronic abdominal pain, diverticulitis hepatitis C who presents for evaluation of abdominal pain.  Patient had a ostomy takedown in October 2019 and since then has had chronic abdominal pain.  She reports that the pain is the same but worse today.  Sharp, located in the left lower quadrant, constant and nonradiating.  Severe in intensity.  No nausea or vomiting, no fever or chills, no flank pain, no chest pain or shortness of breath.  Patient has had dysuria for several months now.  She is currently on amoxicillin for cellulitis of the abdominal wall which seems to have healed.  Past Medical History:  Diagnosis Date  . Anxiety   . Arthritis    neck  . Asthma   . Bipolar disorder (HCC)   . Chronic constipation    on Linzess, Miralax, Zofran  . Chronic insomnia 11/02/2015  . COPD, moderate (HCC)   . Depression   . Depression with anxiety    Followed by Dr. [name], [facility] and [name] for thrapy  . GERD (gastroesophageal reflux disease)   . Hepatitis    Hep C, history, has been treated and cured per pt.  . History of chicken pox   . History of cocaine use   . History of hiatal hernia   . History of pneumonia   . Hypertension   . Hypothyroidism, adult    Working with Dr. Horton ChinShamil [name], Endocrinology. S/P RAIU and is planning on biopsy  . Migraine without status migrainosus, not intractable   . Nicotine dependence, uncomplicated   . Nontoxic multinodular goiter    FNA done 10/30/14 with benign results. Seen [name]. Patient may want a second opinion.  . Rhinitis, allergic   . Vitamin D  deficiency     Patient Active Problem List   Diagnosis Date Noted  . Abdominal pain 06/21/2016  . Cellulitis of abdominal wall 06/21/2016  . UTI (urinary tract infection) 06/21/2016  . Sepsis (HCC) 06/21/2016  . Acute cystitis without hematuria   . Fever 06/20/2016  . Postoperative intra-abdominal abscess 06/13/2016  . Diverticulitis large intestine 04/17/2016  . Diverticulitis of colon with perforation   . Pallor 03/11/2016  . Tongue papillae atrophy 03/11/2016  . Encounter for medication monitoring 03/11/2016  . Peripheral neuropathy 03/11/2016  . Thrush 03/11/2016  . Primary insomnia 02/04/2016  . Right cervical radiculopathy 01/15/2016  . Chronic insomnia 11/02/2015  . Right knee pain 08/22/2015  . Midline low back pain without sciatica 05/07/2015  . Elevated blood pressure 04/20/2015  . Polypharmacy 03/22/2015  . Foot pain, right 03/16/2015  . Pain pharynx 03/08/2015  . GERD without esophagitis 03/08/2015  . Abnormal mammogram of right breast 03/07/2015  . Degenerative disc disease, cervical 02/26/2015  . Bilateral leg cramps 02/26/2015  . Skin lesions, generalized 02/26/2015  . Allergic rhinitis with postnasal drip 01/31/2015  . Chronic sinusitis with recurrent bronchitis 01/31/2015  . Oral thrush 01/31/2015  . History of cocaine use 01/16/2015  . 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 Chronic hepatitis C without hepatic coma (HCC) 01/16/2015  . Chronic constipation 01/16/2015  . COPD, moderate (HCC) 01/16/2015  . Cigarette smoker 01/16/2015  . Bipolar 1  disorder, mixed, partial remission ([place]) 01/16/2015  . Migraine without aura and without status migrainosus, not intractable 01/16/2015  . Depression with anxiety 01/16/2015  . Cephalalgia 03/06/2014  . Imbalance 03/06/2014  . Insomnia due to medical condition 03/06/2014  . Sleep disorder 03/06/2014    Past Surgical History:  Procedure Laterality Date  . ANKLE FRACTURE SURGERY  1988   rods and pins in  place  . APPENDECTOMY  04/17/2016   Procedure: APPENDECTOMY;  Surgeon: Jules Husbands, MD;  Location: [facility] ORS;  Service: General;;  . CARPAL TUNNEL RELEASE Right 11/24/2016   Procedure: CARPAL TUNNEL RELEASE;  Surgeon: Earnestine Leys, MD;  Location: [facility] ORS;  Service: Orthopedics;  Laterality: Right;  . CESAREAN SECTION  1996  . COLECTOMY WITH COLOSTOMY CREATION/HARTMANN PROCEDURE N/A 04/17/2016   Procedure: COLOSTOMY CREATION/HARTMANN PROCEDURE;  Surgeon: Jules Husbands, MD;  Location: [facility] ORS;  Service: General;  Laterality: N/A;  . ECTOPIC PREGNANCY SURGERY  2003  . LAPAROTOMY N/A 04/17/2016   Procedure: EXPLORATORY LAPAROTOMY;  Surgeon: Jules Husbands, MD;  Location: [facility] ORS;  Service: General;  Laterality: N/A;  . NECK SURGERY      Prior to Admission medications   Medication Sig Start Date End Date Taking? Authorizing Provider  acetaminophen (TYLENOL) 500 MG tablet Take 1,000 mg by mouth every 6 (six) hours as needed for moderate pain or headache.    [provider]  amoxicillin (AMOXIL) 500 MG capsule Take 500 mg by mouth every 8 (eight) hours. 07/06/18 01/02/19  [provider]  Aspirin-Salicylamide-Caffeine (BC HEADACHE PO) Take 1 packet by mouth 3 (three) times daily as needed (headache).    [provider]  azelastine (ASTELIN) 0.1 % nasal spray Place 1 spray into the nose 2 (two) times daily. Use in each nostril as directed     [provider]  Brexpiprazole 4 MG TABS Take 4 mg by mouth daily. 07/11/18   [provider]  budesonide-formoterol (SYMBICORT) 160-4.5 MCG/ACT inhaler Inhale 2 puffs into the lungs 2 (two) times daily. USE 2 PUFFS 2 TIMES A DAY Patient taking differently: Inhale 2 puffs into the lungs 2 (two) times daily.  11/02/15   Lada, Satira Anis, MD  butalbital-acetaminophen-caffeine (FIORICET, ESGIC) 50-325-40 MG tablet Take 1 tablet by mouth every 4 (four) hours as needed for headache.     [provider]  cephALEXin  (KEFLEX) 500 MG capsule Take 1 capsule (500 mg total) by mouth 3 (three) times daily for 7 days. 12/26/18 01/02/19  Rudene Re, MD  clonazePAM (KLONOPIN) 1 MG tablet Take 1 mg by mouth 3 (three) times daily.  02/09/14   [provider]  diltiazem (CARDIZEM CD) 180 MG 24 hr capsule Take 1 capsule (180 mg total) by mouth daily. Patient not taking: Reported on 07/30/2018 04/30/16   Olean Ree, MD  EPIPEN 2-PAK 0.3 MG/0.3ML SOAJ injection Inject 0.3 mg into the muscle as needed (allergic reaction).  02/23/15   [provider]  fluticasone (FLONASE) 50 MCG/ACT nasal spray Place 1 spray into both nostrils 2 (two) times daily. 12/11/15   Arnetha Courser, MD  gabapentin (NEURONTIN) 400 MG capsule Take 400 mg by mouth 3 (three) times daily.  02/09/14   [provider]  HYDROcodone-acetaminophen (NORCO) 5-325 MG tablet Take 1 tablet by mouth every 6 (six) hours as needed. Patient not taking: Reported on 07/30/2018 11/24/16   Earnestine Leys, MD  hydrOXYzine (VISTARIL) 50 MG capsule Take 50 mg by mouth 2 (two) times daily.  [provider]  lamoTRIgine (LAMICTAL) 150 MG tablet Take 300 mg by mouth daily.    [provider]  levocetirizine (XYZAL) 5 MG tablet Take 5 mg by mouth daily. 01/31/16   [provider]  Melatonin 5 MG CAPS Take 10 mg by mouth Nightly.    [provider]  meloxicam (MOBIC) 15 MG tablet Take 1 tablet (15 mg total) by mouth daily. Patient not taking: Reported on 07/30/2018 11/24/16   [name], [name], MD  metoprolol succinate (TOPROL-XL) 50 MG 24 hr tablet Take 50 mg by mouth daily. 01/04/18   [provider]  montelukast (SINGULAIR) 10 MG tablet TAKE 1 TABLET (10 MG TOTAL) BY MOUTH AT BEDTIME. 01/23/16   Lada, Janit BernMelinda P, MD  nortriptyline (PAMELOR) 50 MG capsule Take 100 mg by mouth at bedtime.     [provider]  ondansetron (ZOFRAN) 4 MG tablet Take 4 mg by mouth every 6 (six) hours as needed for nausea or  vomiting.    [provider]  pantoprazole (PROTONIX) 40 MG tablet Take 40 mg by mouth daily.    [provider]  polyethylene glycol powder (GLYCOLAX/MIRALAX) powder Take 17 g by mouth daily as needed for mild constipation. Patient taking differently: Take 17 g by mouth 2 (two) times daily.  04/17/16   Lada, Janit BernMelinda P, MD  prazosin (MINIPRESS) 5 MG capsule Take 5 mg by mouth at bedtime.    [provider]  promethazine (PHENERGAN) 25 MG tablet Take 25 mg by mouth every 6 (six) hours as needed for nausea or vomiting.    [provider]  rOPINIRole (REQUIP) 2 MG tablet Take 2 mg by mouth 2 (two) times daily. 06/28/16   [provider]  Suvorexant (BELSOMRA) 20 MG TABS Take 20 mg by mouth at bedtime.    [provider]  SYNTHROID 75 MCG tablet Take 75 mcg by mouth daily. 07/27/18   [provider]  tiZANidine (ZANAFLEX) 4 MG tablet Take 2 mg by mouth daily.    [provider]  Tolnaftate (ABSORBINE JR EX) Apply 1 application topically daily as needed (pain).    [provider]  topiramate (TOPAMAX) 100 MG tablet Take 100 mg by mouth daily. 07/11/18   [provider]  traMADol (ULTRAM) 50 MG tablet Take 50 mg by mouth every 6 (six) hours as needed for pain. 11/14/16   [provider]  VENTOLIN HFA 108 (90 Base) MCG/ACT inhaler Inhale 2 puffs into the lungs every 4 (four) hours as needed for wheezing or shortness of breath. Patient taking differently: Inhale 2 puffs into the lungs every 6 (six) hours as needed for wheezing or shortness of breath.  04/18/16   Kerman PasseyLada, Melinda P, MD    Allergies Patient has no known allergies.  Family History  Problem Relation Age of Onset  . Heart disease Father   . Alcohol abuse Father   . Diabetes Father   . Heart disease Brother   . Depression Brother   . Stroke Brother   . Alcohol abuse Paternal Grandfather   . Osteoporosis Mother   . Breast cancer Neg Hx      Social History Social History   Tobacco Use  . Smoking status: Current Every Day Smoker    Packs/day: 1.00    Years: 32.00    Pack years: 32.00    Types: Cigarettes  . Smokeless tobacco: Never Used  Substance Use Topics  . Alcohol use: No    Alcohol/week: 0.0 standard  drinks  . Drug use: No    Comment: history of cocaine use, clean for 3.5 years     Review of Systems  Constitutional: Negative for fever. Eyes: Negative for visual changes. ENT: Negative for sore throat. Neck: No neck pain  Cardiovascular: Negative for chest pain. Respiratory: Negative for shortness of breath. Gastrointestinal: + abdominal pain. No vomiting or diarrhea. Genitourinary: Negative for dysuria. Musculoskeletal: Negative for back pain. Skin: Negative for rash. Neurological: Negative for headaches, weakness or numbness. Psych: No SI or HI  ____________________________________________   PHYSICAL EXAM:  VITAL SIGNS: ED Triage Vitals  Enc Vitals Group     BP 12/25/18 2009 98/66     Pulse Rate 12/25/18 2009 81     Resp 12/25/18 2009 18     Temp 12/25/18 2009 99.3 F (37.4 C)     Temp Source 12/25/18 2009 Oral     SpO2 12/25/18 2009 98 %     Weight 12/25/18 2011 161 lb (73 kg)     Height 12/25/18 2011  (1.753 m)     Head Circumference --      Peak Flow --      Pain Score 12/25/18 2010 7     Pain Loc --      Pain Edu? --      Excl. in GC? --     Constitutional: Alert and oriented. Well appearing and in no apparent distress. HEENT:      Head: Normocephalic and atraumatic.         Eyes: Conjunctivae are normal. Sclera is non-icteric.       Mouth/Throat: Mucous membranes are moist.       Neck: Supple with no signs of meningismus. Cardiovascular: Regular rate and rhythm. No murmurs, gallops, or rubs. 2+ symmetrical distal pulses are present in all extremities. No JVD. Respiratory: Normal respiratory effort. Lungs are clear to auscultation bilaterally. No wheezes, crackles, or  rhonchi.  Gastrointestinal: Soft, tender to palpation over the LLQ, well healed surgical scar with no overlying cellulitis, non distended with positive bowel sounds. No rebound or guarding. Genitourinary: No CVA tenderness. Musculoskeletal: Nontender with normal range of motion in all extremities. No edema, cyanosis, or erythema of extremities. Neurologic: Normal speech and language. Face is symmetric. Moving all extremities. No gross focal neurologic deficits are appreciated. Skin: Skin is warm, dry and intact. No rash noted. Psychiatric: Mood and affect are normal. Speech and behavior are normal.  ____________________________________________   LABS (all labs ordered are listed, but only abnormal results are displayed)  Labs Reviewed  COMPREHENSIVE METABOLIC PANEL - Abnormal; Notable for the following components:      Result Value   Potassium 3.4 (*)    Glucose, Bld 119 (*)    Alkaline Phosphatase 128 (*)    Total Bilirubin 0.2 (*)    All other components within normal limits  CBC - Abnormal; Notable for the following components:   WBC 11.7 (*)    Hemoglobin 11.1 (*)    HCT 34.5 (*)    All other components within normal limits  URINALYSIS, COMPLETE (UACMP) WITH MICROSCOPIC - Abnormal; Notable for the following components:   Color, Urine YELLOW (*)    APPearance CLOUDY (*)    Nitrite POSITIVE (*)    Leukocytes,Ua LARGE (*)    WBC, UA >50 (*)    Bacteria, UA MANY (*)    All other components within normal limits  URINE CULTURE  LIPASE, BLOOD   ____________________________________________  EKG  none  ____________________________________________  RADIOLOGY  I have personally reviewed the images performed during this visit and I agree with the Radiologist's read.   Interpretation by Radiologist:  Ct Abdomen Pelvis W Contrast  Result Date: 12/26/2018 CLINICAL DATA:  Acute abdominal pain. Nausea, vomiting, diarrhea. Patient reports colostomy reversal eight months prior,  pain since that time. EXAM: CT ABDOMEN AND PELVIS WITH CONTRAST TECHNIQUE: Multidetector CT imaging of the abdomen and pelvis was performed using the standard protocol following bolus administration of intravenous contrast. CONTRAST:  100mL OMNIPAQUE IOHEXOL 300 MG/ML  SOLN COMPARISON:  CT 01/29/2018. FINDINGS: Lower chest: Scattered lower lobe atelectasis. No pleural fluid. Hepatobiliary: Prominent liver spanning 21.1 cm. Diffuse steatosis. Focal fatty sparing adjacent the gallbladder fossa without suspicious hepatic lesion. Gallbladder physiologically distended, no calcified stone. No biliary dilatation. Pancreas: No ductal dilatation or inflammation. Spleen: Normal in size without focal abnormality. Adrenals/Urinary Tract: Normal adrenal glands. No hydronephrosis or perinephric edema. Homogeneous renal enhancement with symmetric excretion on delayed phase imaging. Punctate nonobstructing stone in the left kidney. Urinary bladder is physiologically distended without wall thickening. Stomach/Bowel: Stomach is nondistended. Small duodenal diverticulum without inflammation. No small bowel obstruction or inflammatory change. Prior appendectomy. Colostomy reversal with enteric sutures in the sigmoid colon. Moderate volume of colonic stool. No colonic wall thickening or inflammatory change. Vascular/Lymphatic: Aorta bi-iliac atherosclerosis. No aneurysm. Portal vein is patent. No adenopathy. Reproductive: Uterus and bilateral adnexa are unremarkable. Other: No free air, free fluid, or intra-abdominal fluid collection. Postsurgical changes the mid anterior abdominal wall. Postsurgical change of the left lower quadrant post colostomy and reversal. No subcutaneous fluid collection. Musculoskeletal: There are no acute or suspicious osseous abnormalities. IMPRESSION: 1. No acute abnormality or explanation for abdominal pain. 2. Colostomy reversal with enteric sutures in the sigmoid colon. Constipation pattern with moderate  colonic stool. No bowel inflammation. 3. Hepatomegaly and hepatic steatosis. 4. Nonobstructing left nephrolithiasis. 5.  Aortic Atherosclerosis (ICD10-I70.0). Electronically Signed   By: Narda RutherfordMelanie  Sanford M.D.   On: 12/26/2018 02:05      ____________________________________________   PROCEDURES  Procedure(s) performed: None Procedures Critical Care performed:  None ____________________________________________   INITIAL IMPRESSION / ASSESSMENT AND PLAN / ED COURSE   53 y.o. female with a history of chronic pain on Suboxone, chronic abdominal pain, diverticulitis hepatitis C who presents for evaluation of acute on chronic LLQ abdominal pain.  Patient is tender to palpation the left lower quadrant no rebound or guarding, afebrile with a soft blood pressure of 98/66 although not very far from patient's baseline which is usually in the low 100s.  She looks slightly dry on exam.  UA is positive for UTI.  Labs showing mild leukocytosis with white count of 11.7.  Review of prior cultures shows patient has had Klebsiella which is susceptible to cephalosporins.  Will give a dose of Rocephin and send urine for culture.  Will discharge home on Keflex.  We will send patient for CT abdomen pelvis to rule out possible intra-abdominal complications of her ostomy takedown versus SBO versus recurrent diverticulitis.    _________________________ 2:48 AM on 12/26/2018 -----------------------------------------  CT showing no acute findings other than constipation with moderate colonic stool burden.  Patient with no signs of sepsis.  Received Rocephin and will be discharged on Keflex.  Discussed bowel regimen with her and follow-up with PCP.  Discussed my standard return precautions.   As part of my medical decision making, I reviewed the following data within the electronic MEDICAL RECORD NUMBER Nursing notes reviewed and incorporated, Labs reviewed , Old  chart reviewed, Radiograph reviewed , Notes from prior ED  visits and [place] Controlled Substance Database    Pertinent labs & imaging results that were available during my care of the patient were reviewed by me and considered in my medical decision making (see chart for details).    ____________________________________________   FINAL CLINICAL IMPRESSION(S) / ED DIAGNOSES  Final diagnoses:  Acute cystitis with hematuria  Constipation, unspecified constipation type      NEW MEDICATIONS STARTED DURING THIS VISIT:  ED Discharge Orders         Ordered    cephALEXin (KEFLEX) 500 MG capsule  3 times daily     12/26/18 0247           Note:  This document was prepared using Dragon voice recognition software and may include unintentional dictation errors.    Don Perking, [place], MD 12/26/18 [phone]

## 2018-12-26 NOTE — ED Notes (Signed)
Pt back from CT at this time 

## 2018-12-28 ENCOUNTER — Telehealth: Payer: Self-pay | Admitting: Emergency Medicine

## 2018-12-28 ENCOUNTER — Emergency Department
Admission: EM | Admit: 2018-12-28 | Discharge: 2018-12-28 | Disposition: A | Discharge status: Home / Self Care | Payer: 59 | Attending: Emergency Medicine | Admitting: Emergency Medicine

## 2018-12-28 ENCOUNTER — Encounter: Payer: Self-pay | Admitting: *Deleted

## 2018-12-28 ENCOUNTER — Other Ambulatory Visit: Payer: Self-pay

## 2018-12-28 DIAGNOSIS — R1031 Right lower quadrant pain: Secondary | ICD-10-CM | POA: Diagnosis present

## 2018-12-28 DIAGNOSIS — Z5321 Procedure and treatment not carried out due to patient leaving prior to being seen by health care provider: Secondary | ICD-10-CM | POA: Insufficient documentation

## 2018-12-28 LAB — COMPREHENSIVE METABOLIC PANEL
ALT: 11 U/L (ref 0–44)
AST: 20 U/L (ref 15–41)
Albumin: 3.7 g/dL (ref 3.5–5.0)
Alkaline Phosphatase: 129 U/L — ABNORMAL HIGH (ref 38–126)
Anion gap: 12 (ref 5–15)
BUN: 7 mg/dL (ref 6–20)
CO2: 26 mmol/L (ref 22–32)
Calcium: 9.5 mg/dL (ref 8.9–10.3)
Chloride: 100 mmol/L (ref 98–111)
Creatinine, Ser: 0.75 mg/dL (ref 0.44–1.00)
GFR calc Af Amer: >60 mL/min (ref >60)
GFR calc non Af Amer: >60 mL/min (ref >60)
Glucose, Bld: 112 mg/dL — ABNORMAL HIGH (ref 70–99)
Potassium: 3.4 mmol/L — ABNORMAL LOW (ref 3.5–5.1)
Sodium: 138 mmol/L (ref 135–145)
Total Bilirubin: 0.3 mg/dL (ref 0.3–1.2)
Total Protein: 7.1 g/dL (ref 6.5–8.1)

## 2018-12-28 LAB — URINE CULTURE: Culture: >=100000 — AB

## 2018-12-28 LAB — CBC
HCT: 34.1 % — ABNORMAL LOW (ref 36.0–46.0)
Hemoglobin: 11.4 g/dL — ABNORMAL LOW (ref 12.0–15.0)
MCH: 28.7 pg (ref 26.0–34.0)
MCHC: 33.4 g/dL (ref 30.0–36.0)
MCV: 85.9 fL (ref 80.0–100.0)
Platelets: 371 10*3/uL (ref 150–400)
RBC: 3.97 MIL/uL (ref 3.87–5.11)
RDW: 14.4 % (ref 11.5–15.5)
WBC: 11.4 10*3/uL — ABNORMAL HIGH (ref 4.0–10.5)
nRBC: 0 % (ref 0.0–0.2)

## 2018-12-28 NOTE — Telephone Encounter (Signed)
Called patient due to lwot to inquire about condition and follow up plans.  No answer and no voicemail  

## 2018-12-28 NOTE — ED Notes (Signed)
No answer when called several times from lobby 

## 2018-12-28 NOTE — ED Triage Notes (Signed)
Pt brought in via ems. Pt reports right lower abd pain.  Pt was seen here 2 days ago with similar sx. No v/d  Pt alert.

## 2018-12-29 NOTE — Progress Notes (Addendum)
Called both numbers on file, for the third time, and was unable to reach the patient .   Kristeen Miss, PharmD Clinical Pharmacist

## 2018-12-29 NOTE — Progress Notes (Signed)
Called both numbers on file, for the fourth time, and was unable to reach the patient .   Kristeen Miss, PharmD Clinical Pharmacist

## 2018-12-29 NOTE — Progress Notes (Signed)
I spoke with Dr. Kerman Passey about recent urine cultures that grew Klebsiella Pneumoniae in which it was resistant to Cefazolin. Patient was recently seen for UTI with persistent dysuria for several months without flank pain. She was discharged on Keflex. I called the patient's phone twice to discuss the recent findings but was unable to reach by phone or leave a voicemail. Patient will need to STOP Keflex and start Bactrim DS twice daily for 3 days. I took the liberty to call [facility] (pharmacy on file) and spoke with the pharmacist. I informed the pharmacist that I was unable to reach the patient and to tell the patient to d/c Keflex and Bactrim DS- pharmacist verbalized understanding and agreed. She also said that she would try to call the patient as well.   I will try to call the patient again.   Thank you for allowing pharmacy to be a part of this patient's care.   Kristeen Miss, PharmD Clinical Pharmacist

## 2019-01-04 IMAGING — CR DG ELBOW COMPLETE 3+V*R*
4 series · 4 of 4 positions shown · non-contrast
Comparison: None.

CLINICAL DATA: Lump on right elbow, attempted drainage, no success

EXAM:
RIGHT ELBOW - COMPLETE 3+ VIEW

[elbow ap]
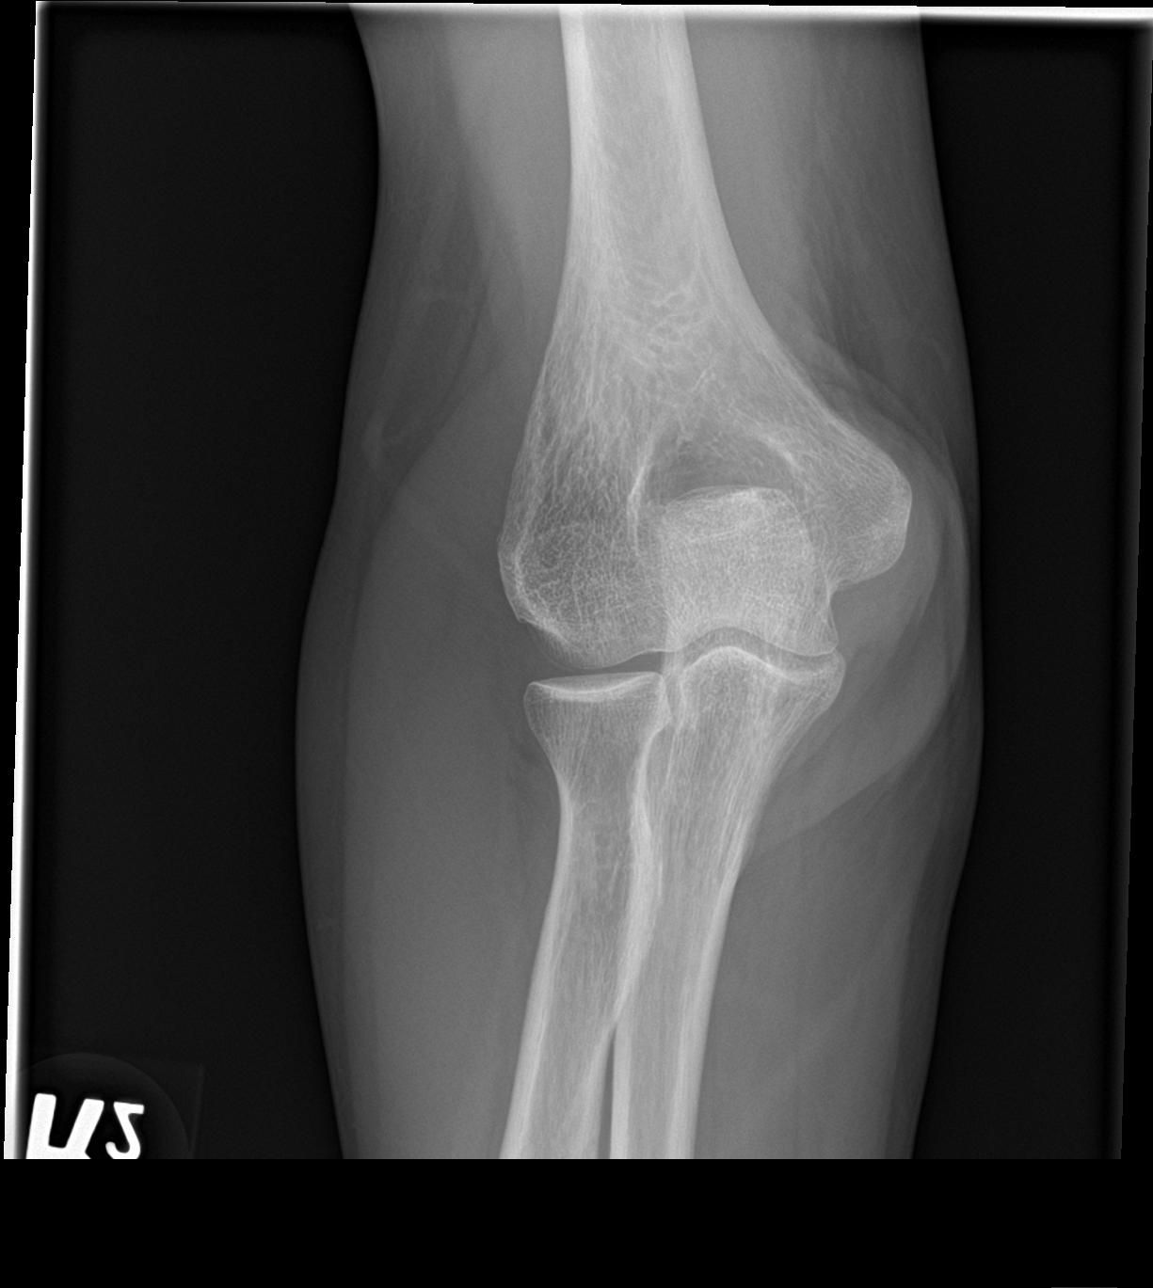

[elbow obl (1 of 2)]
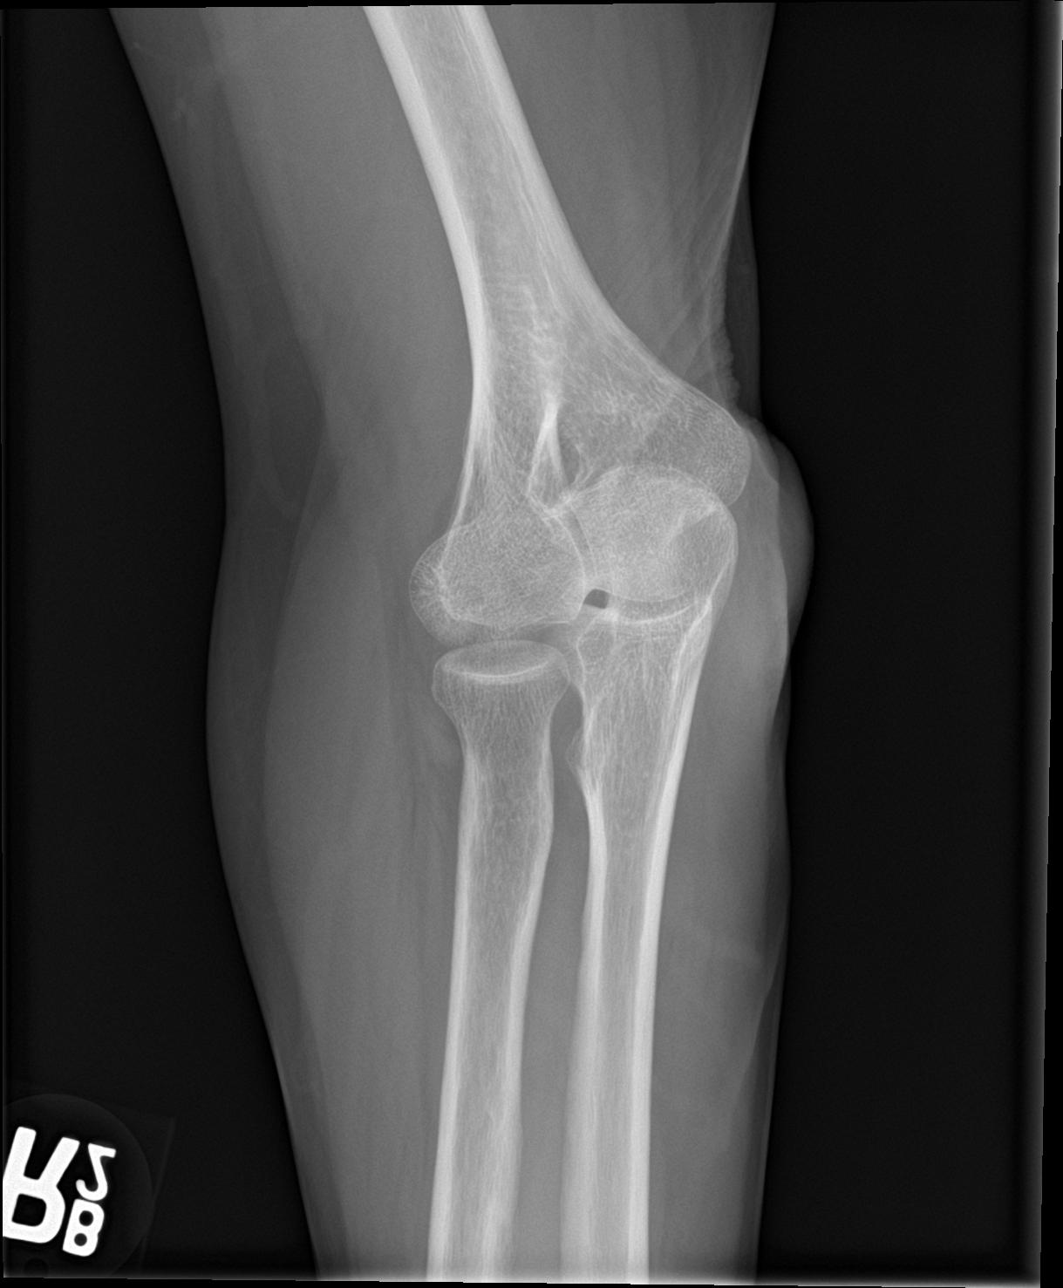

[elbow obl (2 of 2)]
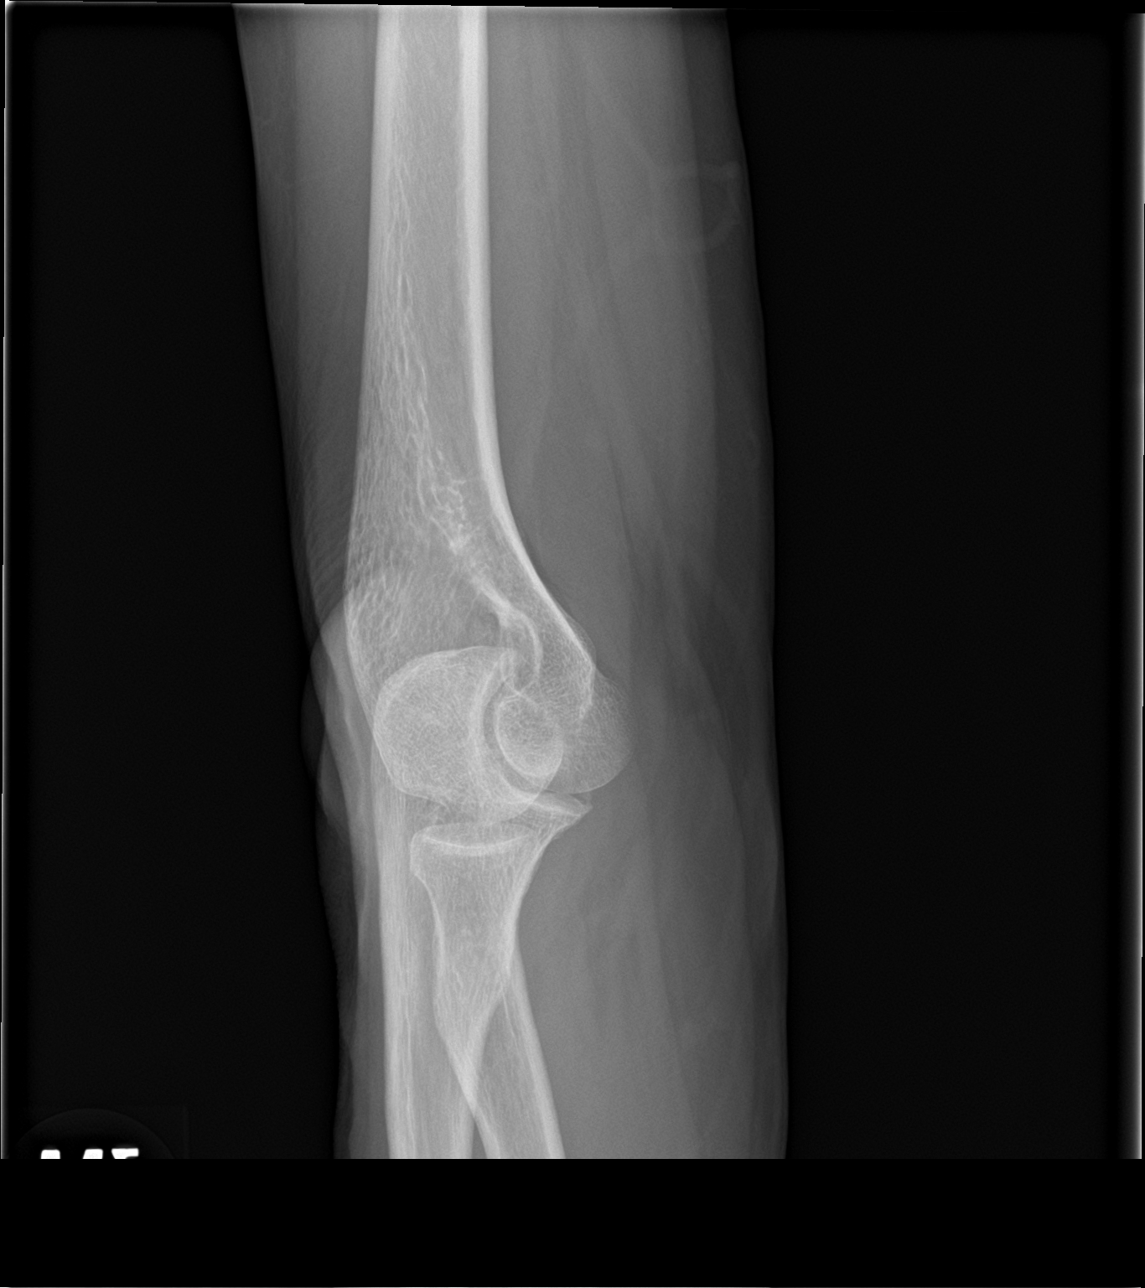

[elbow lat]
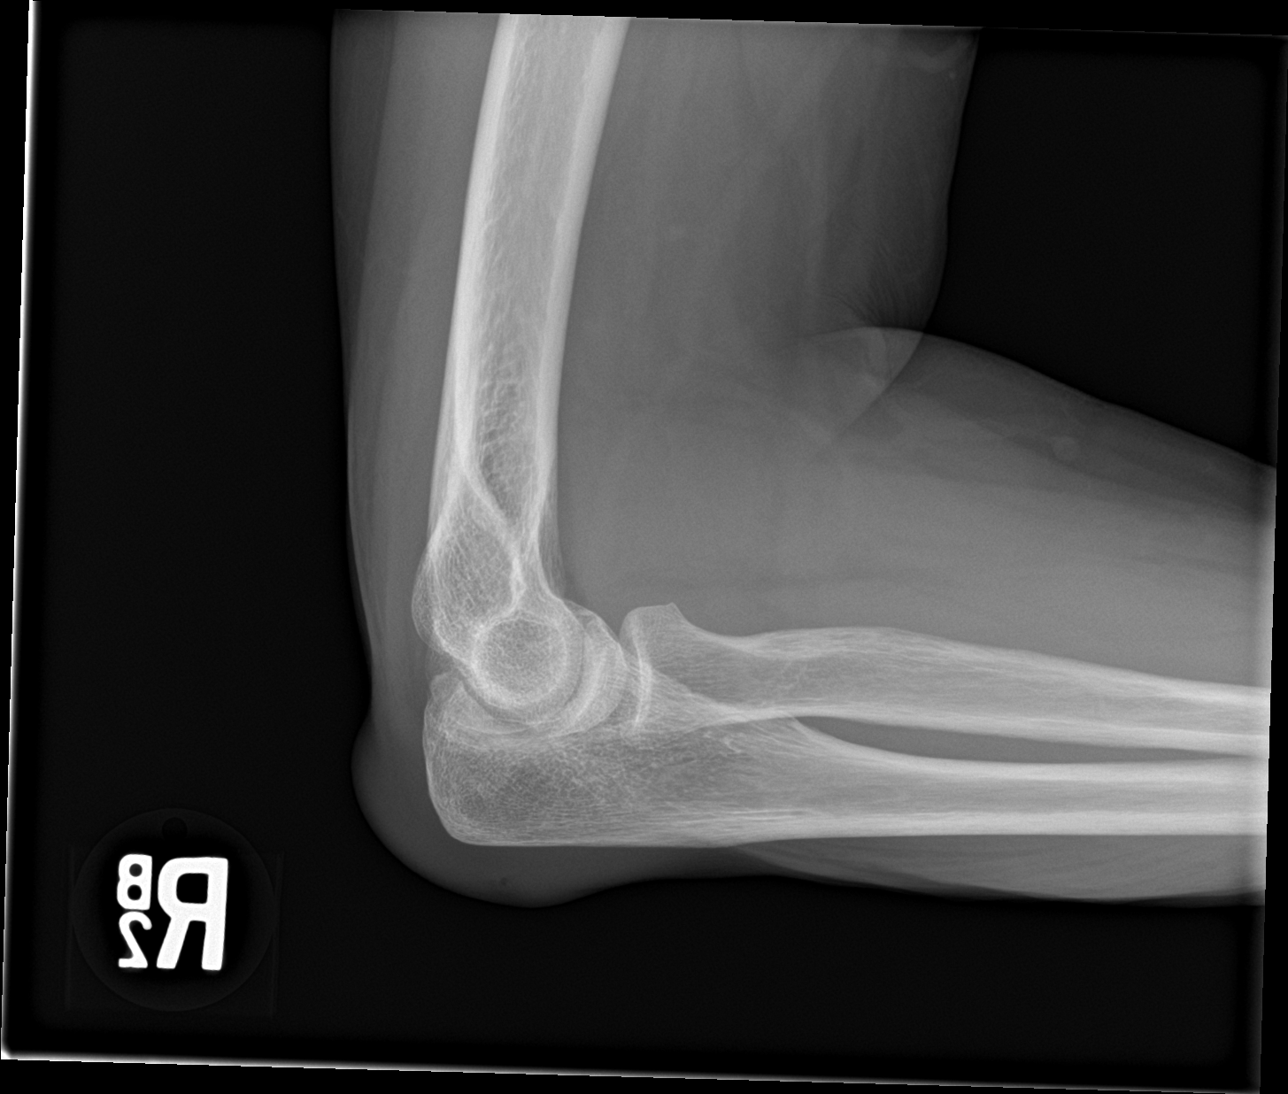

[4 of 4 positions shown; findings below may reference images not displayed]

FINDINGS: Osseous alignment is normal. Bone mineralization is normal. No acute
or suspicious osseous lesion. No fracture line or displaced fracture
fragment. No destructive change to suggest osteomyelitis.

No appreciable joint effusion. There is soft tissue thickening
posterior to the olecranon/proximal ulna.
IMPRESSION: 1. Soft tissue thickening posterior to the RIGHT elbow, suspected
cellulitis or bursitis. No evidence of underlying joint effusion.
2. No osseous abnormality.

## 2019-03-15 ENCOUNTER — Encounter: Payer: Self-pay | Admitting: Obstetrics and Gynecology

## 2019-03-15 ENCOUNTER — Ambulatory Visit (INDEPENDENT_AMBULATORY_CARE_PROVIDER_SITE_OTHER): Payer: Medicare HMO | Admitting: Obstetrics and Gynecology

## 2019-03-15 ENCOUNTER — Other Ambulatory Visit: Payer: Self-pay

## 2019-03-15 VITALS — BP 122/83 | HR 88 | Ht 69.0 in | Wt 165.9 lb

## 2019-03-15 DIAGNOSIS — N926 Irregular menstruation, unspecified: Secondary | ICD-10-CM | POA: Diagnosis not present

## 2019-03-15 DIAGNOSIS — N3001 Acute cystitis with hematuria: Secondary | ICD-10-CM

## 2019-03-15 DIAGNOSIS — N950 Postmenopausal bleeding: Secondary | ICD-10-CM

## 2019-03-15 DIAGNOSIS — R5383 Other fatigue: Secondary | ICD-10-CM | POA: Diagnosis not present

## 2019-03-15 LAB — POCT URINALYSIS DIPSTICK
Bilirubin, UA: NEGATIVE
Glucose, UA: NEGATIVE
Ketones, UA: NEGATIVE
Nitrite, UA: NEGATIVE
Protein, UA: NEGATIVE
Spec Grav, UA: 1.01 (ref 1.010–1.025)
Urobilinogen, UA: 0.2 E.U./dL
pH, UA: 6.5 (ref 5.0–8.0)

## 2019-03-15 MED ORDER — NITROFURANTOIN MONOHYD MACRO 100 MG PO CAPS: 100.0000 mg | ORAL_CAPSULE | Freq: Two times a day (BID) | ORAL | 1 refills | Status: DC

## 2019-03-15 NOTE — Progress Notes (Signed)
Patient comes in today as new patient. She has been having irregular bleeding and some pelvic pain.

## 2019-03-15 NOTE — Progress Notes (Signed)
HPI:      [name] is a 53 y.o. G1P1001 who LMP was No LMP recorded (lmp unknown). Patient is postmenopausal.  Subjective:   She presents today with complaint of 2-week history of pelvic/abdominal pain and 1 week history of vaginal bleeding "like a period".  She states that her normal menses stopped at the age of 54.  This is her first episode of bleeding. In discussion with the patient today she seems overly medicated to the point where she speaks slowly and seems to slur her words.  She presents with her mother present because "she does not drive".     Hx: The following portions of the patient's history were reviewed and updated as appropriate:             She  has a past medical history of Anxiety, Arthritis, Asthma, Bipolar disorder (HCC), Chronic constipation, Chronic insomnia (11/02/2015), COPD, moderate (HCC), Depression, Depression with anxiety, GERD (gastroesophageal reflux disease), Hepatitis, History of chicken pox, History of cocaine use, History of hiatal hernia, History of pneumonia, Hypertension, Hypothyroidism, adult, Migraine without status migrainosus, not intractable, Nicotine dependence, uncomplicated, Nontoxic multinodular goiter, Rhinitis, allergic, and Vitamin D deficiency. She does not have any pertinent problems on file. She  has a past surgical history that includes Ectopic pregnancy surgery (2003); Ankle fracture surgery (1988); Cesarean section (1996); Neck surgery; laparotomy (N/A, 04/17/2016); Colectomy with colostomy creation/hartmann procedure (N/A, 04/17/2016); Appendectomy (04/17/2016); and Carpal tunnel release (Right, 11/24/2016). Her family history includes Alcohol abuse in her father and paternal grandfather; Depression in her brother; Diabetes in her father; Heart disease in her brother and father; Osteoporosis in her mother; Stroke in her brother. She  reports that she has been smoking cigarettes. She has a 32.00 pack-year smoking history. She has never  used smokeless tobacco. She reports that she does not drink alcohol or use drugs. She has a current medication list which includes the following prescription(s): acetaminophen, aspirin-salicylamide-caffeine, azelastine, brexpiprazole, budesonide-formoterol, butalbital-acetaminophen-caffeine, clonazepam, diltiazem, epipen 2-pak, fluticasone, gabapentin, hydrocodone-acetaminophen, hydroxyzine, lamotrigine, levocetirizine, melatonin, meloxicam, metoprolol succinate, montelukast, nortriptyline, ondansetron, pantoprazole, polyethylene glycol powder, prazosin, promethazine, ropinirole, suvorexant, synthroid, tizanidine, tolnaftate, topiramate, tramadol, ventolin hfa, and nitrofurantoin (macrocrystal-monohydrate). She has No Known Allergies.       Review of Systems:  Review of Systems  Constitutional: Denied constitutional symptoms, night sweats, recent illness, fatigue, fever, insomnia and weight loss.  Eyes: Denied eye symptoms, eye pain, photophobia, vision change and visual disturbance.  Ears/Nose/Throat/Neck: Denied ear, nose, throat or neck symptoms, hearing loss, nasal discharge, sinus congestion and sore throat.  Cardiovascular: Denied cardiovascular symptoms, arrhythmia, chest pain/pressure, edema, exercise intolerance, orthopnea and palpitations.  Respiratory: Denied pulmonary symptoms, asthma, pleuritic pain, productive sputum, cough, dyspnea and wheezing.  Gastrointestinal: Denied, gastro-esophageal reflux, melena, nausea and vomiting.  Genitourinary: See HPI for additional information.  Musculoskeletal: Denied musculoskeletal symptoms, stiffness, swelling, muscle weakness and myalgia.  Dermatologic: Denied dermatology symptoms, rash and scar.  Neurologic: Denied neurology symptoms, dizziness, headache, neck pain and syncope.  Psychiatric: Denied psychiatric symptoms, anxiety and depression.  Endocrine: Denied endocrine symptoms including hot flashes and night sweats.   Meds:   Current  Outpatient Medications on File Prior to Visit  Medication Sig Dispense Refill  . acetaminophen (TYLENOL) 500 MG tablet Take 1,000 mg by mouth every 6 (six) hours as needed for moderate pain or headache.    . Aspirin-Salicylamide-Caffeine (BC HEADACHE PO) Take 1 packet by mouth 3 (three) times daily as needed (headache).    [name] azelastine (ASTELIN) 0.1 %  nasal spray Place 1 spray into the nose 2 (two) times daily. Use in each nostril as directed     . Brexpiprazole 4 MG TABS Take 4 mg by mouth daily.    . budesonide-formoterol (SYMBICORT) 160-4.5 MCG/ACT inhaler Inhale 2 puffs into the lungs 2 (two) times daily. USE 2 PUFFS 2 TIMES A DAY (Patient taking differently: Inhale 2 puffs into the lungs 2 (two) times daily. ) 10.2 Inhaler 2  . butalbital-acetaminophen-caffeine (FIORICET, ESGIC) 50-325-40 MG tablet Take 1 tablet by mouth every 4 (four) hours as needed for headache.     . clonazePAM (KLONOPIN) 1 MG tablet Take 1 mg by mouth 3 (three) times daily.     [name]. diltiazem (CARDIZEM CD) 180 MG 24 hr capsule Take 1 capsule (180 mg total) by mouth daily. 30 capsule 1  . EPIPEN 2-PAK 0.3 MG/0.3ML SOAJ injection Inject 0.3 mg into the muscle as needed (allergic reaction).     . fluticasone (FLONASE) 50 MCG/ACT nasal spray Place 1 spray into both nostrils 2 (two) times daily. 16 g 11  . gabapentin (NEURONTIN) 400 MG capsule Take 400 mg by mouth 3 (three) times daily.     [name]. HYDROcodone-acetaminophen (NORCO) 5-325 MG tablet Take 1 tablet by mouth every 6 (six) hours as needed. 40 tablet 0  . hydrOXYzine (VISTARIL) 50 MG capsule Take 50 mg by mouth 2 (two) times daily.    [name]. lamoTRIgine (LAMICTAL) 150 MG tablet Take 300 mg by mouth daily.    [name]. levocetirizine (XYZAL) 5 MG tablet Take 5 mg by mouth daily.    . Melatonin 5 MG CAPS Take 10 mg by mouth Nightly.    . meloxicam (MOBIC) 15 MG tablet Take 1 tablet (15 mg total) by mouth daily. 30 tablet 3  . metoprolol succinate (TOPROL-XL) 50 MG 24 hr tablet Take 50 mg  by mouth daily.    . montelukast (SINGULAIR) 10 MG tablet TAKE 1 TABLET (10 MG TOTAL) BY MOUTH AT BEDTIME. 30 tablet 6  . nortriptyline (PAMELOR) 50 MG capsule Take 100 mg by mouth at bedtime.     . ondansetron (ZOFRAN) 4 MG tablet Take 4 mg by mouth every 6 (six) hours as needed for nausea or vomiting.    . pantoprazole (PROTONIX) 40 MG tablet Take 40 mg by mouth daily.    . polyethylene glycol powder (GLYCOLAX/MIRALAX) powder Take 17 g by mouth daily as needed for mild constipation. (Patient taking differently: Take 17 g by mouth 2 (two) times daily. ) 527 g 2  . prazosin (MINIPRESS) 5 MG capsule Take 5 mg by mouth at bedtime.    . promethazine (PHENERGAN) 25 MG tablet Take 25 mg by mouth every 6 (six) hours as needed for nausea or vomiting.    [name]. rOPINIRole (REQUIP) 2 MG tablet Take 2 mg by mouth 2 (two) times daily.    . Suvorexant (BELSOMRA) 20 MG TABS Take 20 mg by mouth at bedtime.    [name]. SYNTHROID 75 MCG tablet Take 75 mcg by mouth daily.    [name]. tiZANidine (ZANAFLEX) 4 MG tablet Take 2 mg by mouth daily.    . Tolnaftate (ABSORBINE JR EX) Apply 1 application topically daily as needed (pain).    . topiramate (TOPAMAX) 100 MG tablet Take 100 mg by mouth daily.    . traMADol (ULTRAM) 50 MG tablet Take 50 mg by mouth every 6 (six) hours as needed for pain.  1  . VENTOLIN HFA 108 (90 Base) MCG/ACT inhaler Inhale 2  puffs into the lungs every 4 (four) hours as needed for wheezing or shortness of breath. (Patient taking differently: Inhale 2 puffs into the lungs every 6 (six) hours as needed for wheezing or shortness of breath. ) 1 Inhaler 1   No current facility-administered medications on file prior to visit.     Objective:     Vitals:   03/15/19 1339  BP: 122/83  Pulse: 88              Patient refused pelvic exam because she did not want to remove her tampon and wear a pad.  UA with moderate leuks and small amount of blood  Assessment:    G1P1001 Patient Active Problem List    Diagnosis Date Noted  . Abdominal pain 06/21/2016  . Cellulitis of abdominal wall 06/21/2016  . UTI (urinary tract infection) 06/21/2016  . Sepsis ([place]) 06/21/2016  . Acute cystitis without hematuria   . Fever 06/20/2016  . Postoperative intra-abdominal abscess 06/13/2016  . Diverticulitis large intestine 04/17/2016  . Diverticulitis of colon with perforation   . Pallor 03/11/2016  . Tongue papillae atrophy 03/11/2016  . Encounter for medication monitoring 03/11/2016  . Peripheral neuropathy 03/11/2016  . Thrush 03/11/2016  . Primary insomnia 02/04/2016  . Right cervical radiculopathy 01/15/2016  . Chronic insomnia 11/02/2015  . Right knee pain 08/22/2015  . Midline low back pain without sciatica 05/07/2015  . Elevated blood pressure 04/20/2015  . Polypharmacy 03/22/2015  . Foot pain, right 03/16/2015  . Pain pharynx 03/08/2015  . GERD without esophagitis 03/08/2015  . Abnormal mammogram of right breast 03/07/2015  . Degenerative disc disease, cervical 02/26/2015  . Bilateral leg cramps 02/26/2015  . Skin lesions, generalized 02/26/2015  . Allergic rhinitis with postnasal drip 01/31/2015  . Chronic sinusitis with recurrent bronchitis 01/31/2015  . Oral thrush 01/31/2015  . History of cocaine use 01/16/2015  . 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 Chronic hepatitis C without hepatic coma ([place]) 01/16/2015  . Chronic constipation 01/16/2015  . COPD, moderate ([place]) 01/16/2015  . Cigarette smoker 01/16/2015  . Bipolar 1 disorder, mixed, partial remission ([place]) 01/16/2015  . Migraine without aura and without status migrainosus, not intractable 01/16/2015  . Depression with anxiety 01/16/2015  . Cephalalgia 03/06/2014  . Imbalance 03/06/2014  . Insomnia due to medical condition 03/06/2014  . Sleep disorder 03/06/2014     1. Irregular bleeding   2. Postmenopausal bleeding   3. Acute cystitis with hematuria     Apparent postmenopausal bleeding  Possible UTI-patient  says "I get a lot of them".  Pelvic/abdominal pain.  This seems to be ongoing and has been present ever since her abdominal surgeries.   Plan:            1.  FSH to confirm menopause  2.  Treat with Macrobid for UTI  3.  Ultrasound for endometrial thickness and attention ovaries for source of pelvic pain. Orders Orders Placed This Encounter  Procedures  . US PELVIS (TRANSABDOMINAL ONLY)  . [place]  . Monitor Drug Profile 14(MW)  . POC Urinalysis Dipstick OB     Meds ordered this encounter  Medications  . nitrofurantoin, macrocrystal-monohydrate, (MACROBID) 100 MG capsule    Sig: Take 1 capsule (100 mg total) by mouth 2 (two) times daily.    Dispense:  14 capsule    Refill:  1      F/U  Return for We will contact her with any abnormal test results. I spent 33 minutes involved in  the care of this patient of which greater than 50% was spent discussing history of abdominal pelvic pain and multiple prior abdominal surgeries, postmenopausal bleeding, history of menopause, possible UTI, ultrasound follow-up for pain and endometrial thickness, necessity of examination for rule out cervical issues including polyps.  All questions answered.  Elonda Huskyavid J. Evans, M.D. 03/15/2019 2:14 PM

## 2019-03-16 LAB — FOLLICLE STIMULATING HORMONE: FSH: 7.1 m[IU]/mL

## 2019-03-22 ENCOUNTER — Other Ambulatory Visit: Payer: Medicare Other

## 2019-03-25 LAB — OXYCODONE/OXYMORPHONE CONFIRM
OXYCODONE/OXYMORPH: POSITIVE — AB
OXYCODONE: 647 ng/mL
OXYCODONE: POSITIVE — AB
OXYMORPHONE CONFIRM: 932 ng/mL
OXYMORPHONE: POSITIVE — AB

## 2019-03-25 LAB — OPIATES CONFIRMATION, URINE
Codeine: NEGATIVE
Hydrocodone: NEGATIVE
Hydromorphone Confirm: 266 ng/mL
Hydromorphone: POSITIVE — AB
MORPHINE CONFIRM: >3000 ng/mL
Morphine: POSITIVE — AB
Opiates: POSITIVE ng/mL — AB

## 2019-03-25 LAB — MONITOR DRUG PROFILE 14(MW)
Amphetamine Scrn, Ur: NEGATIVE ng/mL
CANNABINOIDS UR QL SCN: NEGATIVE ng/mL
Cocaine (Metab) Scrn, Ur: NEGATIVE ng/mL
Creatinine(Crt), U: 47.8 mg/dL (ref 20.0–300.0)
Fentanyl, Urine: NEGATIVE pg/mL
Meperidine Screen, Urine: NEGATIVE ng/mL
Methadone Screen, Urine: NEGATIVE ng/mL
Ph of Urine: 6.1 (ref 4.5–8.9)
Phencyclidine Qn, Ur: NEGATIVE ng/mL
Propoxyphene Scrn, Ur: NEGATIVE ng/mL
SPECIFIC GRAVITY: 1.007
Tramadol Screen, Urine: NEGATIVE ng/mL

## 2019-03-25 LAB — BENZODIAZEPINES CONFIRM, URINE
ALPRAZOLAM: NEGATIVE
BENZODIAZEPINES: POSITIVE ng/mL — AB
CLONAZEPAM CONFIRM: 604 ng/mL
CLONAZEPAM: POSITIVE — AB
Flurazepam: NEGATIVE
LORAZEPAM: NEGATIVE
MIDAZOLAM: NEGATIVE
NORDIAZEPAM: NEGATIVE
Oxazepam: NEGATIVE
TEMAZEPAM: NEGATIVE
TRIAZOLAM: NEGATIVE

## 2019-03-25 LAB — BUPRENORPHINE CONFIRM, URINE
Buprenorphine: NEGATIVE
Buprenorphine: POSITIVE — AB
Norbuprenorphine Confirm: 600 ng/mL
Norbuprenorphine: POSITIVE — AB

## 2019-03-25 LAB — BARBITURATE (GC/MS), URINE
AMOBARBITAL: NEGATIVE
BARBITURATES: POSITIVE — AB
BUTALBITAL: POSITIVE — AB
Butalbital: 1280 ng/mL
PENTOBARBITAL: NEGATIVE
PHENOBARBITAL: NEGATIVE
SECOBARBITAL: NEGATIVE

## 2019-03-31 ENCOUNTER — Ambulatory Visit (INDEPENDENT_AMBULATORY_CARE_PROVIDER_SITE_OTHER): Payer: Medicare HMO

## 2019-03-31 ENCOUNTER — Other Ambulatory Visit: Payer: Self-pay

## 2019-03-31 DIAGNOSIS — N950 Postmenopausal bleeding: Secondary | ICD-10-CM

## 2019-03-31 DIAGNOSIS — N926 Irregular menstruation, unspecified: Secondary | ICD-10-CM

## 2019-04-05 ENCOUNTER — Ambulatory Visit
Admission: RE | Admit: 2019-04-05 | Discharge: 2019-04-05 | Disposition: A | Discharge status: Home / Self Care | Payer: Medicare HMO | Source: Ambulatory Visit | Attending: Family Medicine | Admitting: Family Medicine

## 2019-04-05 DIAGNOSIS — Z1231 Encounter for screening mammogram for malignant neoplasm of breast: Secondary | ICD-10-CM | POA: Diagnosis present

## 2019-04-07 ENCOUNTER — Encounter: Payer: Self-pay | Admitting: Obstetrics and Gynecology

## 2019-04-07 ENCOUNTER — Ambulatory Visit (INDEPENDENT_AMBULATORY_CARE_PROVIDER_SITE_OTHER): Payer: 59 | Admitting: Obstetrics and Gynecology

## 2019-04-07 ENCOUNTER — Other Ambulatory Visit: Payer: Self-pay

## 2019-04-07 VITALS — BP 160/92 | HR 50 | Wt 171.4 lb

## 2019-04-07 DIAGNOSIS — K4090 Unilateral inguinal hernia, without obstruction or gangrene, not specified as recurrent: Secondary | ICD-10-CM | POA: Diagnosis not present

## 2019-04-07 NOTE — Progress Notes (Signed)
HPI:      [name] is a 53 y.o. G1P1001 who LMP was No LMP recorded (lmp unknown). Patient is postmenopausal.  Subjective:   She presents today for follow-up of her ultrasound and suspected postmenopausal bleeding.  Patient states that she has had no further episodes of bleeding.  She continues to experience left lower quadrant intermittent pain. When questioned about her bowel movements she states she "stays constipated". Patient has had 2 prior abdominal hernia repair procedures.    Hx: The following portions of the patient's history were reviewed and updated as appropriate:             She  has a past medical history of Anxiety, Arthritis, Asthma, Bipolar disorder (HCC), Chronic constipation, Chronic insomnia (11/02/2015), COPD, moderate (HCC), Depression, Depression with anxiety, GERD (gastroesophageal reflux disease), Hepatitis, History of chicken pox, History of cocaine use, History of hiatal hernia, History of pneumonia, Hypertension, Hypothyroidism, adult, Migraine without status migrainosus, not intractable, Nicotine dependence, uncomplicated, Nontoxic multinodular goiter, Rhinitis, allergic, and Vitamin D deficiency. She does not have any pertinent problems on file. She  has a past surgical history that includes Ectopic pregnancy surgery (2003); Ankle fracture surgery (1988); Cesarean section (1996); Neck surgery; laparotomy (N/A, 04/17/2016); Colectomy with colostomy creation/hartmann procedure (N/A, 04/17/2016); Appendectomy (04/17/2016); and Carpal tunnel release (Right, 11/24/2016). Her family history includes Alcohol abuse in her father and paternal grandfather; Depression in her brother; Diabetes in her father; Heart disease in her brother and father; Osteoporosis in her mother; Stroke in her brother. She  reports that she has been smoking cigarettes. She has a 32.00 pack-year smoking history. She has never used smokeless tobacco. She reports that she does not drink alcohol or  use drugs. She has a current medication list which includes the following prescription(s): acetaminophen, aspirin-salicylamide-caffeine, azelastine, brexpiprazole, budesonide-formoterol, butalbital-acetaminophen-caffeine, clonazepam, diltiazem, epipen 2-pak, fluticasone, gabapentin, hydrocodone-acetaminophen, hydroxyzine, lamotrigine, levocetirizine, melatonin, meloxicam, metoprolol succinate, montelukast, nitrofurantoin (macrocrystal-monohydrate), nortriptyline, ondansetron, pantoprazole, polyethylene glycol powder, prazosin, promethazine, ropinirole, suvorexant, synthroid, tizanidine, tolnaftate, topiramate, tramadol, and ventolin hfa. She has No Known Allergies.       Review of Systems:  Review of Systems  Constitutional: Denied constitutional symptoms, night sweats, recent illness, fatigue, fever, insomnia and weight loss.  Eyes: Denied eye symptoms, eye pain, photophobia, vision change and visual disturbance.  Ears/Nose/Throat/Neck: Denied ear, nose, throat or neck symptoms, hearing loss, nasal discharge, sinus congestion and sore throat.  Cardiovascular: Denied cardiovascular symptoms, arrhythmia, chest pain/pressure, edema, exercise intolerance, orthopnea and palpitations.  Respiratory: Denied pulmonary symptoms, asthma, pleuritic pain, productive sputum, cough, dyspnea and wheezing.  Gastrointestinal: Denied, gastro-esophageal reflux, melena, nausea and vomiting.  Genitourinary: Denied genitourinary symptoms including symptomatic vaginal discharge, pelvic relaxation issues, and urinary complaints.  Musculoskeletal: Denied musculoskeletal symptoms, stiffness, swelling, muscle weakness and myalgia.  Dermatologic: Denied dermatology symptoms, rash and scar.  Neurologic: Denied neurology symptoms, dizziness, headache, neck pain and syncope.  Psychiatric: Denied psychiatric symptoms, anxiety and depression.  Endocrine: Denied endocrine symptoms including hot flashes and night sweats.    Meds:   Current Outpatient Medications on File Prior to Visit  Medication Sig Dispense Refill  . acetaminophen (TYLENOL) 500 MG tablet Take 1,000 mg by mouth every 6 (six) hours as needed for moderate pain or headache.    . Aspirin-Salicylamide-Caffeine (BC HEADACHE PO) Take 1 packet by mouth 3 (three) times daily as needed (headache).    [name] azelastine (ASTELIN) 0.1 % nasal spray Place 1 spray into the nose 2 (two) times daily. Use in each  nostril as directed     . Brexpiprazole 4 MG TABS Take 4 mg by mouth daily.    . budesonide-formoterol (SYMBICORT) 160-4.5 MCG/ACT inhaler Inhale 2 puffs into the lungs 2 (two) times daily. USE 2 PUFFS 2 TIMES A DAY (Patient taking differently: Inhale 2 puffs into the lungs 2 (two) times daily. ) 10.2 Inhaler 2  . butalbital-acetaminophen-caffeine (FIORICET, ESGIC) 50-325-40 MG tablet Take 1 tablet by mouth every 4 (four) hours as needed for headache.     . clonazePAM (KLONOPIN) 1 MG tablet Take 1 mg by mouth 3 (three) times daily.     [name]. diltiazem (CARDIZEM CD) 180 MG 24 hr capsule Take 1 capsule (180 mg total) by mouth daily. 30 capsule 1  . EPIPEN 2-PAK 0.3 MG/0.3ML SOAJ injection Inject 0.3 mg into the muscle as needed (allergic reaction).     . fluticasone (FLONASE) 50 MCG/ACT nasal spray Place 1 spray into both nostrils 2 (two) times daily. 16 g 11  . gabapentin (NEURONTIN) 400 MG capsule Take 400 mg by mouth 3 (three) times daily.     [name]. HYDROcodone-acetaminophen (NORCO) 5-325 MG tablet Take 1 tablet by mouth every 6 (six) hours as needed. 40 tablet 0  . hydrOXYzine (VISTARIL) 50 MG capsule Take 50 mg by mouth 2 (two) times daily.    [name]. lamoTRIgine (LAMICTAL) 150 MG tablet Take 300 mg by mouth daily.    [name]. levocetirizine (XYZAL) 5 MG tablet Take 5 mg by mouth daily.    . Melatonin 5 MG CAPS Take 10 mg by mouth Nightly.    . meloxicam (MOBIC) 15 MG tablet Take 1 tablet (15 mg total) by mouth daily. 30 tablet 3  . metoprolol succinate (TOPROL-XL) 50 MG 24  hr tablet Take 50 mg by mouth daily.    . montelukast (SINGULAIR) 10 MG tablet TAKE 1 TABLET (10 MG TOTAL) BY MOUTH AT BEDTIME. 30 tablet 6  . nitrofurantoin, macrocrystal-monohydrate, (MACROBID) 100 MG capsule Take 1 capsule (100 mg total) by mouth 2 (two) times daily. 14 capsule 1  . nortriptyline (PAMELOR) 50 MG capsule Take 100 mg by mouth at bedtime.     . ondansetron (ZOFRAN) 4 MG tablet Take 4 mg by mouth every 6 (six) hours as needed for nausea or vomiting.    . pantoprazole (PROTONIX) 40 MG tablet Take 40 mg by mouth daily.    . polyethylene glycol powder (GLYCOLAX/MIRALAX) powder Take 17 g by mouth daily as needed for mild constipation. (Patient taking differently: Take 17 g by mouth 2 (two) times daily. ) 527 g 2  . prazosin (MINIPRESS) 5 MG capsule Take 5 mg by mouth at bedtime.    . promethazine (PHENERGAN) 25 MG tablet Take 25 mg by mouth every 6 (six) hours as needed for nausea or vomiting.    [name]. rOPINIRole (REQUIP) 2 MG tablet Take 2 mg by mouth 2 (two) times daily.    . Suvorexant (BELSOMRA) 20 MG TABS Take 20 mg by mouth at bedtime.    [name]. SYNTHROID 75 MCG tablet Take 75 mcg by mouth daily.    [name]. tiZANidine (ZANAFLEX) 4 MG tablet Take 2 mg by mouth daily.    . Tolnaftate (ABSORBINE JR EX) Apply 1 application topically daily as needed (pain).    . topiramate (TOPAMAX) 100 MG tablet Take 100 mg by mouth daily.    . traMADol (ULTRAM) 50 MG tablet Take 50 mg by mouth every 6 (six) hours as needed for pain.  1  . VENTOLIN  HFA 108 (90 Base) MCG/ACT inhaler Inhale 2 puffs into the lungs every 4 (four) hours as needed for wheezing or shortness of breath. (Patient taking differently: Inhale 2 puffs into the lungs every 6 (six) hours as needed for wheezing or shortness of breath. ) 1 Inhaler 1   No current facility-administered medications on file prior to visit.     Objective:     Vitals:   04/07/19 1500  BP: (!) 160/92  Pulse: (!) 50              Examination of the abdomen reveals  multiple scars.  Pain is localized to the left lower quadrant inguinal area small mass palpated likely consistent with hernia.  Assessment:    G1P1001 Patient Active Problem List   Diagnosis Date Noted  . Abdominal pain 06/21/2016  . Cellulitis of abdominal wall 06/21/2016  . UTI (urinary tract infection) 06/21/2016  . Sepsis ([place]) 06/21/2016  . Acute cystitis without hematuria   . Fever 06/20/2016  . Postoperative intra-abdominal abscess 06/13/2016  . Diverticulitis large intestine 04/17/2016  . Diverticulitis of colon with perforation   . Pallor 03/11/2016  . Tongue papillae atrophy 03/11/2016  . Encounter for medication monitoring 03/11/2016  . Peripheral neuropathy 03/11/2016  . Thrush 03/11/2016  . Primary insomnia 02/04/2016  . Right cervical radiculopathy 01/15/2016  . Chronic insomnia 11/02/2015  . Right knee pain 08/22/2015  . Midline low back pain without sciatica 05/07/2015  . Elevated blood pressure 04/20/2015  . Polypharmacy 03/22/2015  . Foot pain, right 03/16/2015  . Pain pharynx 03/08/2015  . GERD without esophagitis 03/08/2015  . Abnormal mammogram of right breast 03/07/2015  . Degenerative disc disease, cervical 02/26/2015  . Bilateral leg cramps 02/26/2015  . Skin lesions, generalized 02/26/2015  . Allergic rhinitis with postnasal drip 01/31/2015  . Chronic sinusitis with recurrent bronchitis 01/31/2015  . Oral thrush 01/31/2015  . History of cocaine use 01/16/2015  . 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 Chronic hepatitis C without hepatic coma ([place]) 01/16/2015  . Chronic constipation 01/16/2015  . COPD, moderate ([place]) 01/16/2015  . Cigarette smoker 01/16/2015  . Bipolar 1 disorder, mixed, partial remission ([place]) 01/16/2015  . Migraine without aura and without status migrainosus, not intractable 01/16/2015  . Depression with anxiety 01/16/2015  . Cephalalgia 03/06/2014  . Imbalance 03/06/2014  . Insomnia due to medical condition 03/06/2014  .  Sleep disorder 03/06/2014     1. Non-recurrent unilateral inguinal hernia without obstruction or gangrene     Left lower quadrant inguinal hernia.   Patient likely continually constipated because of her medication usage (polypharmacy)  Normal pelvic ultrasound with the exception of large dilated loops of bowel noted.  Plan:            1.  I have discussed hernia with the patient.  She plans to return to [facility] where she had her previous hernia repair procedures.  2.  Nothing further to do if she does not continue to have bleeding.  If her vaginal bleeding again occurs consider FSH to confirm menopause followed by endometrial biopsy. Orders No orders of the defined types were placed in this encounter.   No orders of the defined types were placed in this encounter.     F/U  No follow-ups on file. I spent 14 minutes involved in the care of this patient of which greater than 50% was spent discussing bleeding, ultrasound results, abdominal/inguinal hernia.  All questions answered.  Finis Bud, M.D. 04/07/2019 3:17 PM

## 2019-04-20 ENCOUNTER — Ambulatory Visit: Payer: Medicare Other | Admitting: Family Medicine

## 2019-05-30 ENCOUNTER — Encounter: Payer: Self-pay | Admitting: Urology

## 2019-05-30 ENCOUNTER — Ambulatory Visit (INDEPENDENT_AMBULATORY_CARE_PROVIDER_SITE_OTHER): Payer: 59 | Admitting: Urology

## 2019-05-30 ENCOUNTER — Other Ambulatory Visit: Payer: Self-pay

## 2019-05-30 VITALS — BP 92/67 | HR 82 | Ht 69.0 in | Wt 170.0 lb

## 2019-05-30 DIAGNOSIS — N3020 Other chronic cystitis without hematuria: Secondary | ICD-10-CM

## 2019-05-30 DIAGNOSIS — R350 Frequency of micturition: Secondary | ICD-10-CM | POA: Diagnosis not present

## 2019-05-30 DIAGNOSIS — R310 Gross hematuria: Secondary | ICD-10-CM

## 2019-05-30 LAB — MICROSCOPIC EXAMINATION: WBC, UA: >30 /hpf — AB (ref 0–5)

## 2019-05-30 LAB — URINALYSIS, COMPLETE
Bilirubin, UA: NEGATIVE
Glucose, UA: NEGATIVE
Ketones, UA: NEGATIVE
Nitrite, UA: NEGATIVE
Protein,UA: NEGATIVE
Specific Gravity, UA: 1.02 (ref 1.005–1.030)
Urobilinogen, Ur: 1 mg/dL (ref 0.2–1.0)
pH, UA: 7 (ref 5.0–7.5)

## 2019-05-30 LAB — BLADDER SCAN AMB NON-IMAGING

## 2019-05-30 MED ORDER — TRIMETHOPRIM 100 MG PO TABS: 100.0000 mg | ORAL_TABLET | Freq: Every day | ORAL | 11 refills | Status: DC

## 2019-05-30 NOTE — Progress Notes (Signed)
05/30/2019 9:36 AM    Sabrina Espinoza [birth year] [id number]  Referring provider: Armando Gang, FNP [address] [place],  [place] [address]  Chief Complaint  Patient presents with  . Hematuria    New patient    HPI: I was consulted to assist the patient's recurrent bladder infections.  The patient was slurring and when I reviewed the medical record she has a history of bipolar illness.  She is on Neurontin and hydrocodone.  The history was difficult but she appears to get lot of frequency.  She denies a history of gross hematuria but by medical record and history she has microhematuria  She had a CT scan of the abdomen pelvis with contrast in June and with it was normal other than a small stone in the left kidney  Modifying factors: There are no other modifying factors  Associated signs and symptoms: There are no other associated signs and symptoms Aggravating and relieving factors: There are no other aggravating or relieving factors Severity: Moderate Duration: Persistent   PMH: Past Medical History:  Diagnosis Date  . Anxiety   . Arthritis    neck  . Asthma   . Bipolar disorder (HCC)   . Chronic constipation    on Linzess, Miralax, Zofran  . Chronic insomnia 11/02/2015  . COPD, moderate (HCC)   . Depression   . Depression with anxiety    Followed by Dr. Lynett Fish, psychiatrist at [facility] and Wilma Flavin for thrapy  . GERD (gastroesophageal reflux disease)   . Hepatitis    Hep C, history, has been treated and cured per pt.  . History of chicken pox   . History of cocaine use   . History of hiatal hernia   . History of pneumonia   . Hypertension   . Hypothyroidism, adult    Working with Dr. Horton Chin, Endocrinology. S/P RAIU and is planning on biopsy  . Migraine without status migrainosus, not intractable   . Nicotine dependence, uncomplicated   . Nontoxic multinodular goiter    FNA done 10/30/14 with benign results. Seen  [name]. Patient may want a second opinion.  . Rhinitis, allergic   . Vitamin D deficiency     Surgical History: Past Surgical History:  Procedure Laterality Date  . ANKLE FRACTURE SURGERY  1988   rods and pins in place  . APPENDECTOMY  04/17/2016   Procedure: APPENDECTOMY;  Surgeon: Leafy Ro, MD;  Location: [facility] ORS;  Service: General;;  . CARPAL TUNNEL RELEASE Right 11/24/2016   Procedure: CARPAL TUNNEL RELEASE;  Surgeon: Deeann Saint, MD;  Location: [facility] ORS;  Service: Orthopedics;  Laterality: Right;  . CESAREAN SECTION  1996  . COLECTOMY WITH COLOSTOMY CREATION/HARTMANN PROCEDURE N/A 04/17/2016   Procedure: COLOSTOMY CREATION/HARTMANN PROCEDURE;  Surgeon: Leafy Ro, MD;  Location: [facility] OR[facility];  Service: General;  Laterality: N/A;  . ECTOPIC PREGNANCY SURGERY  2003  . LAPAROTOMY N/A 04/17/2016   Procedure: EXPLORATORY LAPAROTOMY;  Surgeon: Leafy Ro, MD;  Location: [facility] ORS;  Service: General;  Laterality: N/A;  . NECK SURGERY      Home Medications:  Allergies as of 05/30/2019   No Known Allergies     Medication List       Accurate as of May 30, 2019  9:36 AM. If you have any questions, ask your nurse or doctor.        STOP taking these medications   Belsomra 20 MG Tabs Generic drug: Suvorexant Stopped by: Lorin Picket  A Stefen Juba, MD   Melatonin 5 MG Caps Stopped by: Martina Sinner, MD   nitrofurantoin (macrocrystal-monohydrate) 100 MG capsule Commonly known as: MACROBID Stopped by: Martina Sinner, MD   traMADol 50 MG tablet Commonly known as: ULTRAM Stopped by: Martina Sinner, MD     TAKE these medications   ABSORBINE JR EX Apply 1 application topically daily as needed (pain).   acetaminophen 500 MG tablet Commonly known as: TYLENOL Take 1,000 mg by mouth every 6 (six) hours as needed for moderate pain or headache.   azelastine 0.1 % nasal spray Commonly known as: ASTELIN Place 1 spray into the nose 2 (two) times daily. Use in  each nostril as directed   BC HEADACHE PO Take 1 packet by mouth 3 (three) times daily as needed (headache).   Brexpiprazole 4 MG Tabs Take 4 mg by mouth daily.   budesonide-formoterol 160-4.5 MCG/ACT inhaler Commonly known as: Symbicort Inhale 2 puffs into the lungs 2 (two) times daily. USE 2 PUFFS 2 TIMES A DAY What changed: additional instructions   butalbital-acetaminophen-caffeine 50-325-40 MG tablet Commonly known as: FIORICET Take 1 tablet by mouth every 4 (four) hours as needed for headache.   clonazePAM 1 MG tablet Commonly known as: KLONOPIN Take 1 mg by mouth 3 (three) times daily.   diltiazem 180 MG 24 hr capsule Commonly known as: CARDIZEM CD Take 1 capsule (180 mg total) by mouth daily.   EpiPen 2-Pak 0.3 mg/0.3 mL Soaj injection Generic drug: EPINEPHrine Inject 0.3 mg into the muscle as needed (allergic reaction).   fluticasone 50 MCG/ACT nasal spray Commonly known as: FLONASE Place 1 spray into both nostrils 2 (two) times daily.   gabapentin 400 MG capsule Commonly known as: NEURONTIN Take 400 mg by mouth 3 (three) times daily.   HYDROcodone-acetaminophen 5-325 MG tablet Commonly known as: Norco Take 1 tablet by mouth every 6 (six) hours as needed.   hydrOXYzine 50 MG capsule Commonly known as: VISTARIL Take 50 mg by mouth 2 (two) times daily.   lamoTRIgine 150 MG tablet Commonly known as: LAMICTAL Take 300 mg by mouth daily.   levocetirizine 5 MG tablet Commonly known as: XYZAL Take 5 mg by mouth daily.   meloxicam 15 MG tablet Commonly known as: MOBIC Take 1 tablet (15 mg total) by mouth daily.   metoprolol succinate 50 MG 24 hr tablet Commonly known as: TOPROL-XL Take 50 mg by mouth daily.   montelukast 10 MG tablet Commonly known as: SINGULAIR TAKE 1 TABLET (10 MG TOTAL) BY MOUTH AT BEDTIME.   nortriptyline 50 MG capsule Commonly known as: PAMELOR Take 100 mg by mouth at bedtime.   ondansetron 4 MG tablet Commonly known as:  ZOFRAN Take 4 mg by mouth every 6 (six) hours as needed for nausea or vomiting.   pantoprazole 40 MG tablet Commonly known as: PROTONIX Take 40 mg by mouth daily.   polyethylene glycol powder 17 GM/SCOOP powder Commonly known as: GLYCOLAX/MIRALAX Take 17 g by mouth daily as needed for mild constipation. What changed: when to take this   prazosin 5 MG capsule Commonly known as: MINIPRESS Take 5 mg by mouth at bedtime.   promethazine 25 MG tablet Commonly known as: PHENERGAN Take 25 mg by mouth every 6 (six) hours as needed for nausea or vomiting.   rOPINIRole 2 MG tablet Commonly known as: REQUIP Take 2 mg by mouth 2 (two) times daily.   Synthroid 75 MCG tablet Generic drug: levothyroxine Take 75 mcg by  mouth daily.   tiZANidine 4 MG tablet Commonly known as: ZANAFLEX Take 2 mg by mouth daily.   topiramate 100 MG tablet Commonly known as: TOPAMAX Take 100 mg by mouth daily.   Ventolin HFA 108 (90 Base) MCG/ACT inhaler Generic drug: albuterol Inhale 2 puffs into the lungs every 4 (four) hours as needed for wheezing or shortness of breath. What changed: when to take this       Allergies: No Known Allergies  Family History: Family History  Problem Relation Age of Onset  . Heart disease Father   . Alcohol abuse Father   . Diabetes Father   . Heart disease Brother   . Depression Brother   . Stroke Brother   . Alcohol abuse Paternal Grandfather   . Osteoporosis Mother   . Breast cancer Neg Hx     Social History:  reports that she has been smoking cigarettes. She has a 32.00 pack-year smoking history. She has never used smokeless tobacco. She reports that she does not drink alcohol or use drugs.  ROS: UROLOGY Frequent Urination?: Yes Hard to postpone urination?: No Burning/pain with urination?: No Get up at night to urinate?: No Leakage of urine?: No Urine stream starts and stops?: No Trouble starting stream?: No Do you have to strain to urinate?: Yes  Blood in urine?: Yes Urinary tract infection?: Yes Sexually transmitted disease?: No Injury to kidneys or bladder?: No Painful intercourse?: No Weak stream?: No Currently pregnant?: No Vaginal bleeding?: No Last menstrual period?: n  Gastrointestinal Nausea?: No Vomiting?: No Indigestion/heartburn?: No Diarrhea?: No Constipation?: No  Constitutional Fever: No Night sweats?: No Weight loss?: No Fatigue?: No  Skin Skin rash/lesions?: No Itching?: No  Eyes Blurred vision?: No Double vision?: No  Ears/Nose/Throat Sore throat?: No Sinus problems?: No  Hematologic/Lymphatic Swollen glands?: No Easy bruising?: No  Cardiovascular Leg swelling?: No Chest pain?: No  Respiratory Cough?: No Shortness of breath?: No  Endocrine Excessive thirst?: No  Musculoskeletal Back pain?: No Joint pain?: No  Neurological Headaches?: No Dizziness?: No  Psychologic Depression?: No Anxiety?: No  Physical Exam: BP 92/67   Pulse 82   Ht 5\' 9"  (1.753 m)   Wt 170 lb (77.1 kg)   LMP  (LMP Unknown) Comment: no period in over a year per patient  BMI 25.10 kg/m   Constitutional:  Alert and oriented, No acute distress. HEENT: [place] AT, moist mucus membranes.  Trachea midline, no masses. Cardiovascular: No clubbing, cyanosis, or edema. Respiratory: Normal respiratory effort, no increased work of breathing. GI: Abdomen is soft, nontender, nondistended, no abdominal masses GU: No CVA tenderness.  No bladder tenderness Skin: No rashes, bruises or suspicious lesions. Lymph: No cervical or inguinal adenopathy. Neurologic: Grossly intact, no focal deficits, moving all 4 extremities. Psychiatric: Normal mood and affect.  Laboratory Data: Lab Results  Component Value Date   WBC 11.4 (H) 12/28/2018   HGB 11.4 (L) 12/28/2018   HCT 34.1 (L) 12/28/2018   MCV 85.9 12/28/2018   PLT 371 12/28/2018    Lab Results  Component Value Date   CREATININE 0.75 12/28/2018    No results  found for: PSA  No results found for: TESTOSTERONE  No results found for: HGBA1C  Urinalysis    Component Value Date/Time   COLORURINE YELLOW (A) 12/25/2018 2013   APPEARANCEUR CLOUDY (A) 12/25/2018 2013   LABSPEC 1.014 12/25/2018 2013   PHURINE 8.0 12/25/2018 2013   GLUCOSEU NEGATIVE 12/25/2018 2013   HGBUR NEGATIVE 12/25/2018 2013   BILIRUBINUR neg  03/15/2019 [place] 12/25/2018 2013   PROTEINUR Negative 03/15/2019 [place] 12/25/2018 2013   UROBILINOGEN 0.2 03/15/2019 1444   UROBILINOGEN 0.2 04/03/2016 1425   NITRITE neg 03/15/2019 1444   NITRITE POSITIVE (A) 12/25/2018 2013   LEUKOCYTESUR Moderate (2+) (A) 03/15/2019 1444   LEUKOCYTESUR LARGE (A) 12/25/2018 2013    Pertinent Imaging:   Assessment & Plan: I thought it was best to get a urine culture and place her on trimethoprim suppression therapy.  She certainly is not on a lot of medication.  I will then perform cystoscopy for safety reasons in approximately 7 weeks.  She does have a smoking history  1. Gross hematuria  - Urinalysis, Complete   No follow-ups on file.  Reece Packer, MD  [place] [address] [place], [place] [address] [phone]

## 2019-05-30 NOTE — Patient Instructions (Signed)
Cystoscopy Cystoscopy is a procedure that is used to help diagnose and sometimes treat conditions that affect the lower urinary tract. The lower urinary tract includes the bladder and the urethra. The urethra is the tube that drains urine from the bladder. Cystoscopy is done using a thin, tube-shaped instrument with a light and camera at the end (cystoscope). The cystoscope may be hard or flexible, depending on the goal of the procedure. The cystoscope is inserted through the urethra, into the bladder. Cystoscopy may be recommended if you have:  Urinary tract infections that keep coming back.  Blood in the urine (hematuria).  An inability to control when you urinate (urinary incontinence) or an overactive bladder.  Unusual cells found in a urine sample.  A blockage in the urethra, such as a urinary stone.  Painful urination.  An abnormality in the bladder found during an intravenous pyelogram (IVP) or CT scan. Cystoscopy may also be done to remove a sample of tissue to be examined under a microscope (biopsy). Tell a health care provider about:  Any allergies you have.  All medicines you are taking, including vitamins, herbs, eye drops, creams, and over-the-counter medicines.  Any problems you or family members have had with anesthetic medicines.  Any blood disorders you have.  Any surgeries you have had.  Any medical conditions you have.  Whether you are pregnant or may be pregnant. What are the risks? Generally, this is a safe procedure. However, problems may occur, including:  Infection.  Bleeding.  Allergic reactions to medicines.  Damage to other structures or organs. What happens before the procedure?  Ask your health care provider about: ? Changing or stopping your regular medicines. This is especially important if you are taking diabetes medicines or blood thinners. ? Taking medicines such as aspirin and ibuprofen. These medicines can thin your blood. Do not take  these medicines unless your health care provider tells you to take them. ? Taking over-the-counter medicines, vitamins, herbs, and supplements.  Follow instructions from your health care provider about eating or drinking restrictions.  Ask your health care provider what steps will be taken to help prevent infection. These may include: ? Washing skin with a germ-killing soap. ? Taking antibiotic medicine.  You may have an exam or testing, such as: ? X-rays of the bladder, urethra, or kidneys. ? Urine tests to check for signs of infection.  Plan to have someone take you home from the hospital or clinic. What happens during the procedure?   You will be given one or more of the following: ? A medicine to help you relax (sedative). ? A medicine to numb the area (local anesthetic).  The area around the opening of your urethra will be cleaned.  The cystoscope will be passed through your urethra into your bladder.  Germ-free (sterile) fluid will flow through the cystoscope to fill your bladder. The fluid will stretch your bladder so that your health care provider can clearly examine your bladder walls.  Your doctor will look at the urethra and bladder. Your doctor may take a biopsy or remove stones.  The cystoscope will be removed, and your bladder will be emptied. The procedure may vary among health care providers and hospitals. What can I expect after the procedure? After the procedure, it is common to have:  Some soreness or pain in your abdomen and urethra.  Urinary symptoms. These include: ? Mild pain or burning when you urinate. Pain should stop within a few minutes after you urinate. This   may last for up to 1 week. ? A small amount of blood in your urine for several days. ? Feeling like you need to urinate but producing only a small amount of urine. Follow these instructions at home: Medicines  Take over-the-counter and prescription medicines only as told by your health care  provider.  If you were prescribed an antibiotic medicine, take it as told by your health care provider. Do not stop taking the antibiotic even if you start to feel better. General instructions  Return to your normal activities as told by your health care provider. Ask your health care provider what activities are safe for you.  Do not drive for 24 hours if you were given a sedative during your procedure.  Watch for any blood in your urine. If the amount of blood in your urine increases, call your health care provider.  Follow instructions from your health care provider about eating or drinking restrictions.  If a tissue sample was removed for testing (biopsy) during your procedure, it is up to you to get your test results. Ask your health care provider, or the department that is doing the test, when your results will be ready.  Drink enough fluid to keep your urine pale yellow.  Keep all follow-up visits as told by your health care provider. This is important. Contact a health care provider if you:  Have pain that gets worse or does not get better with medicine, especially pain when you urinate.  Have trouble urinating.  Have more blood in your urine. Get help right away if you:  Have blood clots in your urine.  Have abdominal pain.  Have a fever or chills.  Are unable to urinate. Summary  Cystoscopy is a procedure that is used to help diagnose and sometimes treat conditions that affect the lower urinary tract.  Cystoscopy is done using a thin, tube-shaped instrument with a light and camera at the end.  After the procedure, it is common to have some soreness or pain in your abdomen and urethra.  Watch for any blood in your urine. If the amount of blood in your urine increases, call your health care provider.  If you were prescribed an antibiotic medicine, take it as told by your health care provider. Do not stop taking the antibiotic even if you start to feel better. This  information is not intended to replace advice given to you by your health care provider. Make sure you discuss any questions you have with your health care provider. Document Released: 06/20/2000 Document Revised: 06/15/2018 Document Reviewed: 06/15/2018 Elsevier Patient Education  2020 [employer]  

## 2019-06-01 LAB — CULTURE, URINE COMPREHENSIVE

## 2019-06-06 ENCOUNTER — Telehealth: Payer: Self-pay | Admitting: Urology

## 2019-06-06 ENCOUNTER — Other Ambulatory Visit: Payer: Self-pay | Admitting: *Deleted

## 2019-06-06 ENCOUNTER — Telehealth: Payer: Self-pay | Admitting: *Deleted

## 2019-06-06 MED ORDER — CEPHALEXIN 500 MG PO CAPS: 500.0000 mg | ORAL_CAPSULE | Freq: Three times a day (TID) | ORAL | 0 refills | Status: AC

## 2019-06-06 NOTE — Telephone Encounter (Signed)
Patient returned your call Left a voicemail to call her back    [place]

## 2019-06-06 NOTE — Telephone Encounter (Signed)
-----   Message from Bjorn Loser, MD sent at 06/06/2019  8:29 AM EST ----- Keflex 500 mg 3 times a day for 7 days; then go back on daily trimethoprim    ----- Message ----- From: Chrystie Nose, CMA Sent: 06/06/2019   7:36 AM EST To: Bjorn Loser, MD   ----- Message ----- From: Interface, [facility] Lab Results In Sent: 05/30/2019   6:00 PM EST To: [facility]

## 2019-06-06 NOTE — Progress Notes (Signed)
Notified patient as instructed, patient pleased. Discussed follow-up appointments, patient agrees  Keflex 500 mg 3 times a day for 7 days; then go back on daily trimethoprim

## 2019-07-25 ENCOUNTER — Encounter: Payer: Self-pay | Admitting: Urology

## 2019-07-25 ENCOUNTER — Ambulatory Visit (INDEPENDENT_AMBULATORY_CARE_PROVIDER_SITE_OTHER): Payer: 59 | Admitting: Urology

## 2019-07-25 ENCOUNTER — Other Ambulatory Visit: Payer: Self-pay

## 2019-07-25 VITALS — BP 120/83 | HR 112 | Ht 68.0 in | Wt 170.0 lb

## 2019-07-25 DIAGNOSIS — N3020 Other chronic cystitis without hematuria: Secondary | ICD-10-CM | POA: Diagnosis not present

## 2019-07-25 DIAGNOSIS — R310 Gross hematuria: Secondary | ICD-10-CM

## 2019-07-25 LAB — URINALYSIS, COMPLETE
Bilirubin, UA: NEGATIVE
Glucose, UA: NEGATIVE
Ketones, UA: NEGATIVE
Nitrite, UA: NEGATIVE
Protein,UA: NEGATIVE
Specific Gravity, UA: 1.015 (ref 1.005–1.030)
Urobilinogen, Ur: 0.2 mg/dL (ref 0.2–1.0)
pH, UA: 7 (ref 5.0–7.5)

## 2019-07-25 LAB — MICROSCOPIC EXAMINATION
Epithelial Cells (non renal): >10 /hpf — AB (ref 0–10)
WBC, UA: >30 /hpf — AB (ref 0–5)

## 2019-07-25 MED ORDER — CIPROFLOXACIN HCL 500 MG PO TABS: 500.0000 mg | ORAL_TABLET | Freq: Two times a day (BID) | ORAL | 0 refills | Status: AC

## 2019-07-25 NOTE — Progress Notes (Signed)
07/25/2019 3:35 PM   Sabrina Espinoza [birth year] [id number]  Referring provider: Remi Haggard, [place],  [place] [address]  Chief Complaint  Patient presents with  . Cysto    HPI: I was consulted to assist the patient's recurrent bladder infections.  The patient was slurring and when I reviewed the medical record she has a history of bipolar illness.  She is on Neurontin and hydrocodone.  The history was difficult but she appears to get lot of frequency.  She denies a history of gross hematuria but by medical record and history she has microhematuria  She had a CT scan of the abdomen pelvis with contrast in June and with it was normal other than a small stone in the left kidney  I thought it was best to get a urine culture and place her on trimethoprim suppression therapy.  She certainly is on a lot of medication.  I will then perform cystoscopy for safety reasons in approximately 7 weeks.  She does have a smoking history  Today Frequency stable.  Last urine culture positive Again patient was quite vague in her history.  She seemed to be slurring some. Cystoscopy: Patient underwent flexible cystoscopy utilizing sterile technique.  His took a number minutes to scan her bladder more than 1 time.  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  No evidence of carcinoma.  Cloudy urine with white flecks.  Urine aspirated and sent for culture  The last culture I treated with Keflex. I will give her ciprofloxacin 500 mg twice a day for 1 week and have her go back on daily trimethoprim and reassess her in approximately 2 months.      PMH: Past Medical History:  Diagnosis Date  . Anxiety   . Arthritis    neck  . Asthma   . Bipolar disorder ([place])   . Chronic constipation    on Linzess, Miralax, Zofran  . Chronic insomnia 11/02/2015  . COPD, moderate ([place])   . Depression   . Depression with anxiety    Followed by Dr. Myer Haff, psychiatrist  at [facility] and [facility] for [facility]  . GERD (gastroesophageal reflux disease)   . Hepatitis    Hep C, history, has been treated and cured per pt.  . History of chicken pox   . History of cocaine use   . History of hiatal hernia   . History of pneumonia   . Hypertension   . Hypothyroidism, adult    Working with Dr. Belinda Fisher, Endocrinology. S/P RAIU and is planning on biopsy  . Migraine without status migrainosus, not intractable   . Nicotine dependence, uncomplicated   . Nontoxic multinodular goiter    FNA done 10/30/14 with benign results. Seen [name]. Patient may want a second opinion.  . Rhinitis, allergic   . Vitamin D deficiency     Surgical History: Past Surgical History:  Procedure Laterality Date  . ANKLE FRACTURE SURGERY  1988   rods and pins in place  . APPENDECTOMY  04/17/2016   Procedure: APPENDECTOMY;  Surgeon: Jules Husbands, MD;  Location: [facility] ORS;  Service: General;;  . CARPAL TUNNEL RELEASE Right 11/24/2016   Procedure: CARPAL TUNNEL RELEASE;  Surgeon: Earnestine Leys, MD;  Location: [facility] ORS;  Service: Orthopedics;  Laterality: Right;  . CESAREAN SECTION  1996  . COLECTOMY WITH COLOSTOMY CREATION/HARTMANN PROCEDURE N/A 04/17/2016   Procedure: COLOSTOMY CREATION/HARTMANN PROCEDURE;  Surgeon: Jules Husbands, MD;  Location:  [facility] ORS;  Service: General;  Laterality: N/A;  . ECTOPIC PREGNANCY SURGERY  2003  . LAPAROTOMY N/A 04/17/2016   Procedure: EXPLORATORY LAPAROTOMY;  Surgeon: Leafy Ro, MD;  Location: [facility] ORS;  Service: General;  Laterality: N/A;  . NECK SURGERY      Home Medications:  Allergies as of 07/25/2019   No Known Allergies     Medication List       Accurate as of July 25, 2019  3:35 PM. If you have any questions, ask your nurse or doctor.        ABSORBINE JR EX Apply 1 application topically daily as needed (pain).   acetaminophen 500 MG tablet Commonly known as: TYLENOL Take 1,000 mg by mouth every 6  (six) hours as needed for moderate pain or headache.   azelastine 0.1 % nasal spray Commonly known as: ASTELIN Place 1 spray into the nose 2 (two) times daily. Use in each nostril as directed   BC HEADACHE PO Take 1 packet by mouth 3 (three) times daily as needed (headache).   Brexpiprazole 4 MG Tabs Take 4 mg by mouth daily.   budesonide-formoterol 160-4.5 MCG/ACT inhaler Commonly known as: Symbicort Inhale 2 puffs into the lungs 2 (two) times daily. USE 2 PUFFS 2 TIMES A DAY What changed: additional instructions   Buprenorphine HCl-Naloxone HCl 8-2 MG Film   butalbital-acetaminophen-caffeine 50-325-40 MG tablet Commonly known as: FIORICET Take 1 tablet by mouth every 4 (four) hours as needed for headache.   clonazePAM 1 MG tablet Commonly known as: KLONOPIN Take 1 mg by mouth 3 (three) times daily.   diltiazem 180 MG 24 hr capsule Commonly known as: CARDIZEM CD Take 1 capsule (180 mg total) by mouth daily.   EpiPen 2-Pak 0.3 mg/0.3 mL Soaj injection Generic drug: EPINEPHrine Inject 0.3 mg into the muscle as needed (allergic reaction).   fluticasone 50 MCG/ACT nasal spray Commonly known as: FLONASE Place 1 spray into both nostrils 2 (two) times daily.   gabapentin 400 MG capsule Commonly known as: NEURONTIN Take 400 mg by mouth 3 (three) times daily.   HYDROcodone-acetaminophen 5-325 MG tablet Commonly known as: Norco Take 1 tablet by mouth every 6 (six) hours as needed.   hydrOXYzine 50 MG capsule Commonly known as: VISTARIL Take 50 mg by mouth 2 (two) times daily.   lamoTRIgine 150 MG tablet Commonly known as: LAMICTAL Take 300 mg by mouth daily.   levocetirizine 5 MG tablet Commonly known as: XYZAL Take 5 mg by mouth daily.   meloxicam 15 MG tablet Commonly known as: MOBIC Take 1 tablet (15 mg total) by mouth daily.   metoprolol succinate 50 MG 24 hr tablet Commonly known as: TOPROL-XL Take 50 mg by mouth daily.   montelukast 10 MG tablet  Commonly known as: SINGULAIR TAKE 1 TABLET (10 MG TOTAL) BY MOUTH AT BEDTIME.   nortriptyline 50 MG capsule Commonly known as: PAMELOR Take 100 mg by mouth at bedtime.   ondansetron 4 MG tablet Commonly known as: ZOFRAN Take 4 mg by mouth every 6 (six) hours as needed for nausea or vomiting.   polyethylene glycol powder 17 GM/SCOOP powder Commonly known as: GLYCOLAX/MIRALAX Take 17 g by mouth daily as needed for mild constipation. What changed: when to take this   prazosin 5 MG capsule Commonly known as: MINIPRESS Take 5 mg by mouth at bedtime.   promethazine 25 MG tablet Commonly known as: PHENERGAN Take 25 mg by mouth every 6 (six) hours as needed for  nausea or vomiting.   rOPINIRole 2 MG tablet Commonly known as: REQUIP Take 2 mg by mouth 2 (two) times daily.   Synthroid 75 MCG tablet Generic drug: levothyroxine Take 75 mcg by mouth daily.   tiZANidine 4 MG tablet Commonly known as: ZANAFLEX Take 2 mg by mouth daily.   topiramate 100 MG tablet Commonly known as: TOPAMAX Take 100 mg by mouth daily.   trimethoprim 100 MG tablet Commonly known as: TRIMPEX Take 1 tablet (100 mg total) by mouth daily.   Ventolin HFA 108 (90 Base) MCG/ACT inhaler Generic drug: albuterol Inhale 2 puffs into the lungs every 4 (four) hours as needed for wheezing or shortness of breath. What changed: when to take this       Allergies: No Known Allergies  Family History: Family History  Problem Relation Age of Onset  . Heart disease Father   . Alcohol abuse Father   . Diabetes Father   . Heart disease Brother   . Depression Brother   . Stroke Brother   . Alcohol abuse Paternal Grandfather   . Osteoporosis Mother   . Breast cancer Neg Hx     Social History:  reports that she has been smoking cigarettes. She has a 32.00 pack-year smoking history. She has never used smokeless tobacco. She reports that she does not drink alcohol or use drugs.  ROS:                                         Physical Exam: BP 120/83   Pulse (!) 112   Ht 5\' 8"  (1.727 m)   Wt 77.1 kg   LMP  (LMP Unknown) Comment: no period in over a year per patient  BMI 25.85 kg/m    Laboratory Data: Lab Results  Component Value Date   WBC 11.4 (H) 12/28/2018   HGB 11.4 (L) 12/28/2018   HCT 34.1 (L) 12/28/2018   MCV 85.9 12/28/2018   PLT 371 12/28/2018    Lab Results  Component Value Date   CREATININE 0.75 12/28/2018    No results found for: PSA  No results found for: TESTOSTERONE  No results found for: HGBA1C  Urinalysis    Component Value Date/Time   COLORURINE YELLOW (A) 12/25/2018 2013   APPEARANCEUR Cloudy (A) 05/30/2019 0953   LABSPEC 1.014 12/25/2018 2013   PHURINE 8.0 12/25/2018 2013   GLUCOSEU Negative 05/30/2019 0953   HGBUR NEGATIVE 12/25/2018 2013   BILIRUBINUR Negative 05/30/2019 0953   KETONESUR NEGATIVE 12/25/2018 2013   PROTEINUR Negative 05/30/2019 0953   PROTEINUR NEGATIVE 12/25/2018 2013   UROBILINOGEN 0.2 03/15/2019 1444   UROBILINOGEN 0.2 04/03/2016 1425   NITRITE Negative 05/30/2019 0953   NITRITE POSITIVE (A) 12/25/2018 2013   LEUKOCYTESUR 2+ (A) 05/30/2019 0953   LEUKOCYTESUR LARGE (A) 12/25/2018 2013    Pertinent Imaging:   Assessment & Plan: Reassess 2 months today she had bacteria in the urine no red blood cells  1. Gross hematuria  - Urinalysis, Complete   No follow-ups on file.  2014, MD  [facility] [address] [place], [place] [phone]

## 2019-07-27 LAB — CULTURE, URINE COMPREHENSIVE

## 2019-07-28 ENCOUNTER — Telehealth: Payer: Self-pay | Admitting: *Deleted

## 2019-07-28 MED ORDER — NITROFURANTOIN MACROCRYSTAL 100 MG PO CAPS: 100.0000 mg | ORAL_CAPSULE | Freq: Two times a day (BID) | ORAL | 0 refills | Status: AC

## 2019-07-28 NOTE — Telephone Encounter (Signed)
-----   Message from Alfredo Martinez, MD sent at 07/28/2019 11:36 AM EST -----  Stop cipro Macrodantin 100 mg bid for 7 days Then start the daily triprim as directed        ----- Message ----- From: Levada Schilling, CMA Sent: 07/27/2019   9:09 AM EST To: Alfredo Martinez, MD   ----- Message ----- From: Interface, [facility] Lab Results In Sent: 07/25/2019   4:36 PM EST To: Jennette [facility]

## 2019-08-10 ENCOUNTER — Telehealth: Payer: Self-pay | Admitting: Family Medicine

## 2019-08-10 NOTE — Telephone Encounter (Signed)
Patient called and states she has dysuria. Appointment has been scheduled.

## 2019-08-11 ENCOUNTER — Encounter: Payer: Self-pay | Admitting: Urology

## 2019-08-11 ENCOUNTER — Ambulatory Visit (INDEPENDENT_AMBULATORY_CARE_PROVIDER_SITE_OTHER): Payer: 59 | Admitting: Urology

## 2019-08-11 ENCOUNTER — Ambulatory Visit
Admission: RE | Admit: 2019-08-11 | Discharge: 2019-08-11 | Disposition: A | Discharge status: Home / Self Care | Payer: 59 | Source: Ambulatory Visit | Attending: Urology | Admitting: Urology

## 2019-08-11 ENCOUNTER — Other Ambulatory Visit: Payer: Self-pay

## 2019-08-11 VITALS — BP 143/90 | HR 91 | Ht 68.0 in | Wt 179.3 lb

## 2019-08-11 DIAGNOSIS — R109 Unspecified abdominal pain: Secondary | ICD-10-CM | POA: Insufficient documentation

## 2019-08-11 DIAGNOSIS — N390 Urinary tract infection, site not specified: Secondary | ICD-10-CM

## 2019-08-11 DIAGNOSIS — R300 Dysuria: Secondary | ICD-10-CM | POA: Diagnosis not present

## 2019-08-11 DIAGNOSIS — N952 Postmenopausal atrophic vaginitis: Secondary | ICD-10-CM | POA: Diagnosis not present

## 2019-08-11 LAB — BLADDER SCAN AMB NON-IMAGING: Scan Result: 175

## 2019-08-11 MED ORDER — PREMARIN 0.625 MG/GM VA CREA: 1.0000 | TOPICAL_CREAM | Freq: Every day | VAGINAL | 12 refills | Status: DC

## 2019-08-11 NOTE — Progress Notes (Signed)
08/11/2019 8:41 PM   Sabrina Espinoza June 22, 1966 [id number]  Referring provider: Armando Gang, FNP [address] [place],  [place] [address]  Chief Complaint  Patient presents with  . Dysuria    HPI: Sabrina Espinoza is a 54 year old female with rUTI's and a history of hematuria who presents today for dysuria.  History of hematuria (high risk) Smoker.  Contrast CT 12/2018 Normal adrenal glands. No hydronephrosis or perinephric edema. Homogeneous renal enhancement with symmetric excretion on delayed phase imaging. Punctate nonobstructing stone in the left kidney. Urinary bladder is physiologically distended without wall thickening.  Cystoscopy with Dr. Sherron Monday 07/2019 noted diffuse erythema with mild cystitis cystica.  No evidence of carcinoma.  She denies any gross hematuria.  UA today (08/11/2019) negative for microscopic hematuria.    rUTI's Risk factors: age, female gender, constipation, incomplete bladder emptying, post menopausal and limited fluid intake.  She states that ever since she has had her abdominal surgery in 2017, she has been having a constant UTI.   She could not be specific in regards of her symptoms with urinary tract infection.   She is not sexually active.  She is postmenopausal.  She admits to constipation.  She does take tub baths.  She has incontinence.  She states she wears pads all the time.  She is only drinking sweet tea and Sprite.    She has been followed by Dr. Sherron Monday who had placed her on trimethoprim suppression therapy.  She had a positive Enterococcus faecalis UTI in January.  She was started on Cipro and then when the culture returned was switched to nitrofurantoin.  She was to resume her trimethoprim suppression, but she has misplaced the bottle as of this morning.    Today, she states she is having left lower back pain.  She believes that this is a symptom of an UTI.  Patient denies any modifying or aggravating factors.  Patient denies any  gross hematuria, dysuria or suprapubic/flank pain.  Patient denies any fevers, chills, nausea or vomiting.   She states that she has urinary frequency going up to 20-25 times daily, strong urgency and nocturia 10-15 times.  Her UA today is slightly cloudy, specific gravity 1.015, pH is 7.5, 0-5 WBCs per high-power field greater than 10 epithelial cells per high-power field and moderate bacteria.  Her PVR is 175 mL.    KUB (08/11/2019) mild retained fecal material.  No stones.   PMH: Past Medical History:  Diagnosis Date  . Anxiety   . Arthritis    neck  . Asthma   . Bipolar disorder ([facility])   . Chronic constipation    on Linzess, Miralax, Zofran  . Chronic insomnia 11/02/2015  . COPD, moderate ([facility])   . Depression   . Depression with anxiety    Followed by Dr. Lynett Fish, psychiatrist at [facility] and [facility] for thrapy  . GERD (gastroesophageal reflux disease)   . Hepatitis    Hep C, history, has been treated and cured per pt.  . History of chicken pox   . History of cocaine use   . History of hiatal hernia   . History of pneumonia   . Hypertension   . Hypothyroidism, adult    Working with Dr. Horton Chin, Endocrinology. S/P RAIU and is planning on biopsy  . Migraine without status migrainosus, not intractable   . Nicotine dependence, uncomplicated   . Nontoxic multinodular goiter    FNA done 10/30/14 with benign results.  Seen [name]. Patient may want a second opinion.  . Rhinitis, allergic   . Vitamin D deficiency     Surgical History: Past Surgical History:  Procedure Laterality Date  . ANKLE FRACTURE SURGERY  1988   rods and pins in place  . APPENDECTOMY  04/17/2016   Procedure: APPENDECTOMY;  Surgeon: Leafy Ro, MD;  Location: [facility] ORS;  Service: General;;  . CARPAL TUNNEL RELEASE Right 11/24/2016   Procedure: CARPAL TUNNEL RELEASE;  Surgeon: Deeann Saint, MD;  Location: [facility] ORS;  Service: Orthopedics;  Laterality: Right;  . CESAREAN  SECTION  1996  . COLECTOMY WITH COLOSTOMY CREATION/HARTMANN PROCEDURE N/A 04/17/2016   Procedure: COLOSTOMY CREATION/HARTMANN PROCEDURE;  Surgeon: Leafy Ro, MD;  Location: [facility] ORS;  Service: General;  Laterality: N/A;  . ECTOPIC PREGNANCY SURGERY  2003  . LAPAROTOMY N/A 04/17/2016   Procedure: EXPLORATORY LAPAROTOMY;  Surgeon: Leafy Ro, MD;  Location: [facility] ORS;  Service: General;  Laterality: N/A;  . NECK SURGERY      Home Medications:  Allergies as of 08/11/2019   No Known Allergies     Medication List       Accurate as of August 11, 2019 11:59 PM. If you have any questions, ask your nurse or doctor.        ABSORBINE JR EX Apply 1 application topically daily as needed (pain).   acetaminophen 500 MG tablet Commonly known as: TYLENOL Take 1,000 mg by mouth every 6 (six) hours as needed for moderate pain or headache.   azelastine 0.1 % nasal spray Commonly known as: ASTELIN Place 1 spray into the nose 2 (two) times daily. Use in each nostril as directed   BC HEADACHE PO Take 1 packet by mouth 3 (three) times daily as needed (headache).   Brexpiprazole 4 MG Tabs Take 4 mg by mouth daily.   budesonide-formoterol 160-4.5 MCG/ACT inhaler Commonly known as: Symbicort Inhale 2 puffs into the lungs 2 (two) times daily. USE 2 PUFFS 2 TIMES A DAY What changed: additional instructions   Buprenorphine HCl-Naloxone HCl 8-2 MG Film   butalbital-acetaminophen-caffeine 50-325-40 MG tablet Commonly known as: FIORICET Take 1 tablet by mouth every 4 (four) hours as needed for headache.   clonazePAM 1 MG tablet Commonly known as: KLONOPIN Take 1 mg by mouth 3 (three) times daily.   diltiazem 180 MG 24 hr capsule Commonly known as: CARDIZEM CD Take 1 capsule (180 mg total) by mouth daily.   EpiPen 2-Pak 0.3 mg/0.3 mL Soaj injection Generic drug: EPINEPHrine Inject 0.3 mg into the muscle as needed (allergic reaction).   fluticasone 50 MCG/ACT nasal spray Commonly  known as: FLONASE Place 1 spray into both nostrils 2 (two) times daily.   gabapentin 400 MG capsule Commonly known as: NEURONTIN Take 400 mg by mouth 3 (three) times daily.   HYDROcodone-acetaminophen 5-325 MG tablet Commonly known as: Norco Take 1 tablet by mouth every 6 (six) hours as needed.   hydrOXYzine 50 MG capsule Commonly known as: VISTARIL Take 50 mg by mouth 2 (two) times daily.   lamoTRIgine 150 MG tablet Commonly known as: LAMICTAL Take 300 mg by mouth daily.   levocetirizine 5 MG tablet Commonly known as: XYZAL Take 5 mg by mouth daily.   meloxicam 15 MG tablet Commonly known as: MOBIC Take 1 tablet (15 mg total) by mouth daily.   metoprolol succinate 50 MG 24 hr tablet Commonly known as: TOPROL-XL Take 50 mg by mouth daily.   montelukast 10 MG tablet Commonly  known as: SINGULAIR TAKE 1 TABLET (10 MG TOTAL) BY MOUTH AT BEDTIME.   nortriptyline 50 MG capsule Commonly known as: PAMELOR Take 100 mg by mouth at bedtime.   ondansetron 4 MG tablet Commonly known as: ZOFRAN Take 4 mg by mouth every 6 (six) hours as needed for nausea or vomiting.   polyethylene glycol powder 17 GM/SCOOP powder Commonly known as: GLYCOLAX/MIRALAX Take 17 g by mouth daily as needed for mild constipation. What changed: when to take this   prazosin 5 MG capsule Commonly known as: MINIPRESS Take 5 mg by mouth at bedtime.   Premarin vaginal cream Generic drug: conjugated estrogens Place 1 Applicatorful vaginally daily. Apply 0.5mg  (pea-sized amount)  just inside the vaginal introitus with a finger-tip on  Monday, Wednesday and Friday nights. Started by: Michiel Cowboy, PA-C   promethazine 25 MG tablet Commonly known as: PHENERGAN Take 25 mg by mouth every 6 (six) hours as needed for nausea or vomiting.   rOPINIRole 2 MG tablet Commonly known as: REQUIP Take 2 mg by mouth 2 (two) times daily.   Synthroid 75 MCG tablet Generic drug: levothyroxine Take 75 mcg by mouth  daily.   tiZANidine 4 MG tablet Commonly known as: ZANAFLEX Take 2 mg by mouth daily.   topiramate 100 MG tablet Commonly known as: TOPAMAX Take 100 mg by mouth daily.   trimethoprim 100 MG tablet Commonly known as: TRIMPEX Take 1 tablet (100 mg total) by mouth daily.   Ventolin HFA 108 (90 Base) MCG/ACT inhaler Generic drug: albuterol Inhale 2 puffs into the lungs every 4 (four) hours as needed for wheezing or shortness of breath. What changed: when to take this       Allergies: No Known Allergies  Family History: Family History  Problem Relation Age of Onset  . Heart disease Father   . Alcohol abuse Father   . Diabetes Father   . Heart disease Brother   . Depression Brother   . Stroke Brother   . Alcohol abuse Paternal Grandfather   . Osteoporosis Mother   . Breast cancer Neg Hx     Social History:  reports that she has been smoking cigarettes. She has a 32.00 pack-year smoking history. She has never used smokeless tobacco. She reports that she does not drink alcohol or use drugs.  ROS: UROLOGY Frequent Urination?: Yes Hard to postpone urination?: Yes Burning/pain with urination?: No Get up at night to urinate?: Yes Leakage of urine?: No Urine stream starts and stops?: No Trouble starting stream?: No Do you have to strain to urinate?: No Blood in urine?: No Urinary tract infection?: Yes Sexually transmitted disease?: No Injury to kidneys or bladder?: No Painful intercourse?: No Weak stream?: No Currently pregnant?: No Vaginal bleeding?: No Last menstrual period?: n  Gastrointestinal Nausea?: Yes Vomiting?: No Indigestion/heartburn?: Yes Diarrhea?: No Constipation?: Yes  Constitutional Fever: No Night sweats?: No Weight loss?: No Fatigue?: No  Skin Skin rash/lesions?: No Itching?: No  Eyes Blurred vision?: Yes Double vision?: No  Ears/Nose/Throat Sore throat?: No Sinus problems?: Yes  Hematologic/Lymphatic Swollen glands?:  Yes Easy bruising?: No  Cardiovascular Leg swelling?: No Chest pain?: No  Respiratory Cough?: Yes Shortness of breath?: Yes  Endocrine Excessive thirst?: No  Musculoskeletal Back pain?: Yes Joint pain?: No  Neurological Headaches?: No Dizziness?: No  Psychologic Depression?: Yes Anxiety?: Yes  Physical Exam: BP (!) 143/90   Pulse 91   Ht 5\' 8"  (1.727 m)   Wt 179 lb 4.8 oz (81.3 kg)  LMP  (LMP Unknown) Comment: no period in over a year per patient  BMI 27.26 kg/m   Constitutional:  Well nourished. Alert and oriented, No acute distress.  Slurred speech.  HEENT: [place] AT, mask in place.  Trachea midline, no masses. Cardiovascular: No clubbing, cyanosis, or edema. Respiratory: Normal respiratory effort, no increased work of breathing. GI: Abdomen is soft, non tender, non distended, no abdominal masses. Liver and spleen not palpable.  No hernias appreciated.  Stool sample for occult testing is not indicated.   GU: No CVA tenderness.  No bladder fullness or masses.  Atrophic external genitalia, sparse pubic hair distribution, no lesions.  Normal urethral meatus, no lesions, no prolapse, no discharge.   No urethral masses, tenderness and/or tenderness. No bladder fullness, tenderness or masses. Pale vagina mucosa, poor estrogen effect, no discharge, no lesions, fair pelvic support, grade I cystocele and no rectocele noted.  No cervical motion tenderness.  Uterus is freely mobile and non-fixed.  No adnexal/parametria masses or tenderness noted.  Anus and perineum are without rashes or lesions.    Skin: No rashes, bruises or suspicious lesions. Lymph: No inguinal adenopathy. Neurologic: Grossly intact, no focal deficits, moving all 4 extremities. Psychiatric: Normal mood and affect.   Laboratory Data: Lab Results  Component Value Date   WBC 11.4 (H) 12/28/2018   HGB 11.4 (L) 12/28/2018   HCT 34.1 (L) 12/28/2018   MCV 85.9 12/28/2018   PLT 371 12/28/2018    Lab Results   Component Value Date   CREATININE 0.75 12/28/2018    Lab Results  Component Value Date   TSH 0.241 (L) 06/20/2016       Component Value Date/Time   CHOL 212 (H) 02/26/2015 0944   HDL 61 02/26/2015 0944   CHOLHDL 3.5 02/26/2015 0944   LDLCALC 118 (H) 02/26/2015 0944    Lab Results  Component Value Date   AST 20 12/28/2018   Lab Results  Component Value Date   ALT 11 12/28/2018    Urinalysis Component     Latest Ref Rng & Units 08/11/2019          Specific Gravity, UA     1.005 - 1.030 1.015  pH, UA     5.0 - 7.5 7.5  Color, UA     Yellow Yellow  Appearance Ur     Clear Hazy (A)  Leukocytes,UA     Negative Negative  Protein,UA     Negative/Trace Negative  Glucose, UA     Negative Negative  Ketones, UA     Negative Negative  RBC, UA     Negative Negative  Bilirubin, UA     Negative Negative  Urobilinogen, Ur     0.2 - 1.0 mg/dL 0.2  Nitrite, UA     Negative Negative  Microscopic Examination      See below:   Component     Latest Ref Rng & Units 08/11/2019          WBC, UA     0 - 5 /hpf 0-5  RBC     0 - 2 /hpf None seen  Epithelial Cells (non renal)     0 - 10 /hpf >10 (A)  Bacteria, UA     None seen/Few Moderate (A)  I have reviewed the labs.   Pertinent Imaging: Results for JOHARI, BENNETTS (MRN [id number]) as of 08/11/2019 15:57  Ref. Range 08/11/2019 13:32  Scan Result Unknown 175     Assessment & Plan:  1. rUTI's - resume trimethoprim suppression - will call if she cannot locate the bottle - Urinalysis, Complete - likely contaminated  - CULTURE, URINE COMPREHENSIVE - Bladder Scan (Post Void Residual) in office  2. Vaginal atrophy I explained to the patient that when women go through menopause and her estrogen levels are severely diminished, the normal vaginal flora will change.  This is due to an increase of the vaginal canal's pH. Because of this, the vaginal canal may be colonized by bacteria from the rectum instead of the  protective lactobacillus.  This, accompanied by the loss of the mucus barrier with vaginal atrophy, is a cause of recurrent urinary tract infections. In some studies, the use of vaginal estrogen cream has been demonstrated to reduce  recurrent urinary tract infections to one a year.  Patient was given a sample of vaginal estrogen cream (Premarin vaginal cream) and instructed to apply 0.5mg  (pea-sized amount)  just inside the vaginal introitus with a finger-tip on Monday, Wednesday and Friday  I have also given prescriptions for Premarin cream  3. Left lower back pain KUB does not demonstrate an etiology for her back pain  Return for keep follow up on 10/17/2019 with Dr. Matilde Sprang .  These notes generated with voice recognition software. I apologize for typographical errors.  Zara Council, PA-C  [facility] [address]  [place], [place] [address] [phone]

## 2019-08-11 NOTE — Patient Instructions (Signed)
You are given a sample of vaginal estrogen cream Premarin and instructed to apply 0.5mg  (pea-sized amount)  just inside the vaginal introitus with a finger-tip on Monday, Wednesday and Friday nights.  I also have given you a prescription for Premarin cream.

## 2019-08-12 ENCOUNTER — Telehealth: Payer: Self-pay | Admitting: Family Medicine

## 2019-08-12 LAB — MICROSCOPIC EXAMINATION
Epithelial Cells (non renal): >10 /hpf — AB (ref 0–10)
RBC, Urine: NONE SEEN /hpf (ref 0–2)

## 2019-08-12 LAB — URINALYSIS, COMPLETE
Bilirubin, UA: NEGATIVE
Glucose, UA: NEGATIVE
Ketones, UA: NEGATIVE
Leukocytes,UA: NEGATIVE
Nitrite, UA: NEGATIVE
Protein,UA: NEGATIVE
RBC, UA: NEGATIVE
Specific Gravity, UA: 1.015 (ref 1.005–1.030)
Urobilinogen, Ur: 0.2 mg/dL (ref 0.2–1.0)
pH, UA: 7.5 (ref 5.0–7.5)

## 2019-08-12 NOTE — Telephone Encounter (Signed)
-----   Message from Harle Battiest, PA-C sent at 08/12/2019  7:52 AM EST ----- Please let Mrs. Lacek know that her X-ray was negative for stones.

## 2019-08-12 NOTE — Telephone Encounter (Signed)
Patient notified and voiced understanding.

## 2019-08-14 LAB — CULTURE, URINE COMPREHENSIVE

## 2019-08-15 ENCOUNTER — Telehealth: Payer: Self-pay | Admitting: Family Medicine

## 2019-08-15 NOTE — Telephone Encounter (Signed)
-----   Message from Harle Battiest, PA-C sent at 08/15/2019  7:58 AM EST ----- Please let Sabrina Espinoza know that her urine culture was negative for infection.  She needs to keep her follow up with Dr. Sherron Monday in April.

## 2019-08-15 NOTE — Telephone Encounter (Signed)
Patient notified and voiced understanding.

## 2019-10-17 ENCOUNTER — Encounter: Payer: Self-pay | Admitting: Urology

## 2019-10-17 ENCOUNTER — Ambulatory Visit: Payer: 59 | Admitting: Urology

## 2019-11-03 ENCOUNTER — Encounter (INDEPENDENT_AMBULATORY_CARE_PROVIDER_SITE_OTHER): Payer: Self-pay | Admitting: Vascular Surgery

## 2019-11-07 ENCOUNTER — Ambulatory Visit: Payer: 59 | Admitting: Urology

## 2019-11-07 ENCOUNTER — Encounter: Payer: Self-pay | Admitting: Urology

## 2019-11-14 ENCOUNTER — Ambulatory Visit (INDEPENDENT_AMBULATORY_CARE_PROVIDER_SITE_OTHER): Payer: 59 | Admitting: Vascular Surgery

## 2019-11-14 ENCOUNTER — Encounter (INDEPENDENT_AMBULATORY_CARE_PROVIDER_SITE_OTHER): Payer: Self-pay

## 2019-11-14 ENCOUNTER — Other Ambulatory Visit: Payer: Self-pay

## 2019-11-14 ENCOUNTER — Encounter (INDEPENDENT_AMBULATORY_CARE_PROVIDER_SITE_OTHER): Payer: Self-pay | Admitting: Vascular Surgery

## 2019-11-14 VITALS — BP 148/95 | HR 97 | Resp 16 | Ht 69.0 in | Wt 178.8 lb

## 2019-11-14 DIAGNOSIS — J449 Chronic obstructive pulmonary disease, unspecified: Secondary | ICD-10-CM | POA: Diagnosis not present

## 2019-11-14 DIAGNOSIS — I6523 Occlusion and stenosis of bilateral carotid arteries: Secondary | ICD-10-CM

## 2019-11-14 DIAGNOSIS — I6529 Occlusion and stenosis of unspecified carotid artery: Secondary | ICD-10-CM | POA: Insufficient documentation

## 2019-11-14 DIAGNOSIS — K219 Gastro-esophageal reflux disease without esophagitis: Secondary | ICD-10-CM | POA: Diagnosis not present

## 2019-11-14 NOTE — Addendum Note (Signed)
Addended by: Renford Dills on: 11/14/2019 04:04 PM   Modules accepted: Orders

## 2019-11-14 NOTE — Progress Notes (Addendum)
MRN : [id number]  Sabrina Espinoza is a 54 y.o. (24-Nov-1965) female who presents with chief complaint of  Chief Complaint  Patient presents with  . New Patient (Initial Visit)    ref Lavena Bullion carotid blockage  .  History of Present Illness:   The patient is seen for evaluation of carotid stenosis. The carotid stenosis was identified after carotid ultrasound obtained secondary to a bruit.  There is notation in the chart that she has an 80-99% RICA.  The patient denies amaurosis fugax. There is no recent history of TIA symptoms or focal motor deficits. There is no prior documented CVA.  There is no history of migraine headaches. There is no history of seizures.  The patient is taking enteric-coated aspirin 81 mg daily.  The patient has a history of coronary artery disease, no recent episodes of angina or shortness of breath. The patient denies PAD or claudication symptoms. There is a history of hyperlipidemia which is being treated with a statin.    Current Meds  Medication Sig  . acetaminophen (TYLENOL) 500 MG tablet Take 1,000 mg by mouth every 6 (six) hours as needed for moderate pain or headache.  . Aspirin-Salicylamide-Caffeine (BC HEADACHE PO) Take 1 packet by mouth 3 (three) times daily as needed (headache).  [name] azelastine (ASTELIN) 0.1 % nasal spray Place 1 spray into the nose 2 (two) times daily. Use in each nostril as directed   . Brexpiprazole 4 MG TABS Take 4 mg by mouth daily.  . budesonide-formoterol (SYMBICORT) 160-4.5 MCG/ACT inhaler Inhale 2 puffs into the lungs 2 (two) times daily. USE 2 PUFFS 2 TIMES A DAY (Patient taking differently: Inhale 2 puffs into the lungs 2 (two) times daily. )  . butalbital-acetaminophen-caffeine (FIORICET, ESGIC) 50-325-40 MG tablet Take 1 tablet by mouth every 4 (four) hours as needed for headache.   . clonazePAM (KLONOPIN) 1 MG tablet Take 1 mg by mouth 3 (three) times daily.   [name] diltiazem (CARDIZEM CD) 180 MG 24 hr capsule Take 1  capsule (180 mg total) by mouth daily.  [name] EPIPEN 2-PAK 0.3 MG/0.3ML SOAJ injection Inject 0.3 mg into the muscle as needed (allergic reaction).   . fluticasone (FLONASE) 50 MCG/ACT nasal spray Place 1 spray into both nostrils 2 (two) times daily.  [name] gabapentin (NEURONTIN) 400 MG capsule Take 400 mg by mouth 3 (three) times daily.   . hydrOXYzine (VISTARIL) 50 MG capsule Take 50 mg by mouth 2 (two) times daily.  [name] levocetirizine (XYZAL) 5 MG tablet Take 5 mg by mouth daily.  . metoprolol succinate (TOPROL-XL) 50 MG 24 hr tablet Take 50 mg by mouth daily.  . montelukast (SINGULAIR) 10 MG tablet TAKE 1 TABLET (10 MG TOTAL) BY MOUTH AT BEDTIME.  [name] ondansetron (ZOFRAN) 4 MG tablet Take 4 mg by mouth every 6 (six) hours as needed for nausea or vomiting.  . polyethylene glycol powder (GLYCOLAX/MIRALAX) powder Take 17 g by mouth daily as needed for mild constipation. (Patient taking differently: Take 17 g by mouth 2 (two) times daily. )  . prazosin (MINIPRESS) 5 MG capsule Take 5 mg by mouth at bedtime.  . promethazine (PHENERGAN) 25 MG tablet Take 25 mg by mouth every 6 (six) hours as needed for nausea or vomiting.  [name] rOPINIRole (REQUIP) 2 MG tablet Take 2 mg by mouth 2 (two) times daily.  [name] SYNTHROID 75 MCG tablet Take 75 mcg by mouth daily.  [name] tiZANidine (ZANAFLEX) 4 MG tablet Take 2 mg by mouth  daily.  . Tolnaftate (ABSORBINE JR EX) Apply 1 application topically daily as needed (pain).  . topiramate (TOPAMAX) 100 MG tablet Take 100 mg by mouth daily.  [name] trimethoprim (TRIMPEX) 100 MG tablet Take 1 tablet (100 mg total) by mouth daily.  . VENTOLIN HFA 108 (90 Base) MCG/ACT inhaler Inhale 2 puffs into the lungs every 4 (four) hours as needed for wheezing or shortness of breath. (Patient taking differently: Inhale 2 puffs into the lungs every 6 (six) hours as needed for wheezing or shortness of breath. )    Past Medical History:  Diagnosis Date  . Anxiety   . Arthritis    neck  . Asthma   .  Bipolar disorder (HCC)   . Chronic constipation    on Linzess, Miralax, Zofran  . Chronic insomnia 11/02/2015  . COPD, moderate (HCC)   . Depression   . Depression with anxiety    Followed by Dr. Lynett Fish, psychiatrist at [facility] and Wilma Flavin for thrapy  . GERD (gastroesophageal reflux disease)   . Hepatitis    Hep C, history, has been treated and cured per pt.  . History of chicken pox   . History of cocaine use   . History of hiatal hernia   . History of pneumonia   . Hypertension   . Hypothyroidism, adult    Working with Dr. Horton Chin, Endocrinology. S/P RAIU and is planning on biopsy  . Migraine without status migrainosus, not intractable   . Nicotine dependence, uncomplicated   . Nontoxic multinodular goiter    FNA done 10/30/14 with benign results. Seen [name]. Patient may want a second opinion.  . Rhinitis, allergic   . Vitamin D deficiency     Past Surgical History:  Procedure Laterality Date  . ANKLE FRACTURE SURGERY  1988   rods and pins in place  . APPENDECTOMY  04/17/2016   Procedure: APPENDECTOMY;  Surgeon: Leafy Ro, MD;  Location: [facility];  Service: General;;  . CARPAL TUNNEL RELEASE Right 11/24/2016   Procedure: CARPAL TUNNEL RELEASE;  Surgeon: Deeann Saint, MD;  Location: [facility];  Service: Orthopedics;  Laterality: Right;  . CESAREAN SECTION  1996  . COLECTOMY WITH COLOSTOMY CREATION/HARTMANN PROCEDURE N/A 04/17/2016   Procedure: COLOSTOMY CREATION/HARTMANN PROCEDURE;  Surgeon: Leafy Ro, MD;  Location: [facility];  Service: General;  Laterality: N/A;  . ECTOPIC PREGNANCY SURGERY  2003  . LAPAROTOMY N/A 04/17/2016   Procedure: EXPLORATORY LAPAROTOMY;  Surgeon: Leafy Ro, MD;  Location: [facility];  Service: General;  Laterality: N/A;  . NECK SURGERY      Social History Social History   Tobacco Use  . Smoking status: Current Every Day Smoker    Packs/day: 1.00    Years: 32.00    Pack years: 32.00    Types:  Cigarettes  . Smokeless tobacco: Never Used  Substance Use Topics  . Alcohol use: No    Alcohol/week: 0.0 standard drinks  . Drug use: No    Comment: history of cocaine use, clean for 3.5 years     Family History Family History  Problem Relation Age of Onset  . Heart disease Father   . Alcohol abuse Father   . Diabetes Father   . Heart disease Brother   . Depression Brother   . Stroke Brother   . Alcohol abuse Paternal Grandfather   . Osteoporosis Mother   . Breast cancer Neg Hx   No family history of bleeding/clotting disorders, porphyria or  autoimmune disease   Allergies  Allergen Reactions  . Sulfa Antibiotics     Other reaction(s): Other (See Comments) "feel like Im losing my mind"     REVIEW OF SYSTEMS (Negative unless checked)  Constitutional: [] Weight loss  [] Fever  [] Chills Cardiac: [] Chest pain   [] Chest pressure   [] Palpitations   [] Shortness of breath when laying flat   [] Shortness of breath with exertion. Vascular:  [] Pain in legs with walking   [] Pain in legs at rest  [] History of DVT   [] Phlebitis   [] Swelling in legs   [] Varicose veins   [] Non-healing ulcers Pulmonary:   [] Uses home oxygen   [] Productive cough   [] Hemoptysis   [] Wheeze  [x] COPD   [x] Asthma Neurologic:  [] Dizziness   [] Seizures   [] History of stroke   [] History of TIA  [] Aphasia   [] Vissual changes   [] Weakness or numbness in arm   [] Weakness or numbness in leg Musculoskeletal:   [] Joint swelling   [x] Joint pain   [] Low back pain Hematologic:  [] Easy bruising  [] Easy bleeding   [] Hypercoagulable state   [] Anemic Gastrointestinal:  [] Diarrhea   [] Vomiting  [] Gastroesophageal reflux/heartburn   [] Difficulty swallowing. Genitourinary:  [] Chronic kidney disease   [] Difficult urination  [] Frequent urination   [] Blood in urine Skin:  [] Rashes   [] Ulcers  Psychological:  [] History of anxiety   []  History of major depression.  Physical Examination  Vitals:   11/14/19 1453 11/14/19 1454  BP: (!)  128/91 (!) 148/95  Pulse: 97   Resp: 16   Weight: 178 lb 12.8 oz (81.1 kg)   Height: 5\' 9"  (1.753 m)    Body mass index is 26.4 kg/m. Gen: WD/WN, NAD Head: [place]/AT, No temporalis wasting.  Ear/Nose/Throat: Hearing grossly intact, nares w/o erythema or drainage, poor dentition Eyes: PER, EOMI, sclera nonicteric.  Neck: Supple, no masses.  No bruit or JVD.  Pulmonary:  Good air movement, clear to auscultation bilaterally, no use of accessory muscles.  Cardiac: RRR, normal S1, S2, no Murmurs. Vascular: right carotid bruit Vessel Right Left  Radial Palpable Palpable  Carotid Palpable Palpable  Gastrointestinal: soft, non-distended. No guarding/no peritoneal signs.  Musculoskeletal: M/S 5/5 throughout.  No deformity or atrophy.  Neurologic: CN 2-12 intact. Pain and light touch intact in extremities.  Symmetrical.  Speech is fluent. Motor exam as listed above. Psychiatric: Judgment intact, Mood & affect appropriate for pt's clinical situation. Dermatologic: No rashes or ulcers noted.  No changes consistent with cellulitis.   CBC Lab Results  Component Value Date   WBC 11.4 (H) 12/28/2018   HGB 11.4 (L) 12/28/2018   HCT 34.1 (L) 12/28/2018   MCV 85.9 12/28/2018   PLT 371 12/28/2018    BMET    Component Value Date/Time   NA 138 12/28/2018 0102   NA 138 03/11/2016 1527   NA 137 09/27/2014 1747   K 3.4 (L) 12/28/2018 0102   K 3.7 09/27/2014 1747   CL 100 12/28/2018 0102   CL 99 (L) 09/27/2014 1747   CO2 26 12/28/2018 0102   CO2 29 09/27/2014 1747   GLUCOSE 112 (H) 12/28/2018 0102   GLUCOSE 96 09/27/2014 1747   BUN 7 12/28/2018 0102   BUN 7 03/11/2016 1527   BUN 8 09/27/2014 1747   CREATININE 0.75 12/28/2018 0102   CREATININE 0.82 09/27/2014 1747   CALCIUM 9.5 12/28/2018 0102   CALCIUM 9.2 09/27/2014 1747   GFRNONAA >60 12/28/2018 0102   GFRNONAA >60 09/27/2014 1747   GFRAA >60 12/28/2018  0102   GFRAA >60 09/27/2014 1747   CrCl cannot be calculated (Patient's most  recent lab result is older than the maximum 21 days allowed.).  COAG Lab Results  Component Value Date   INR 0.98 06/20/2016    Radiology No results found.   Assessment/Plan 1. Bilateral carotid artery stenosis  The patient remains asymptomatic with respect to the carotid stenosis.  However, the patient has now progressed and has a lesion the is >70%.  Patient should undergo CT angiography of the carotid arteries to define the degree of stenosis of the internal carotid arteries bilaterally and the anatomic suitability for surgery vs. intervention.  If the patient does indeed need surgery cardiac clearance will be required, once cleared the patient will be scheduled for surgery.  The risks, benefits and alternative therapies were reviewed in detail with the patient.  All questions were answered.  The patient agrees to proceed with imaging.  Continue antiplatelet therapy as prescribed. Continue management of CAD, HTN and Hyperlipidemia. Healthy heart diet, encouraged exercise at least 4 times per week.   - CT ANGIO NECK W OR WO CONTRAST; Future  2. COPD, moderate (HCC) Continue pulmonary medications and aerosols as already ordered, these medications have been reviewed and there are no changes at this time.    3. GERD without esophagitis Continue PPI as already ordered, this medication has been reviewed and there are no changes at this time.  Avoidence of caffeine and alcohol  Moderate elevation of the head of the bed    [name], MD  11/14/2019 3:06 PM

## 2019-11-21 ENCOUNTER — Encounter: Payer: Self-pay | Admitting: Urology

## 2019-11-21 ENCOUNTER — Ambulatory Visit: Payer: 59 | Admitting: Urology

## 2019-11-22 ENCOUNTER — Encounter (INDEPENDENT_AMBULATORY_CARE_PROVIDER_SITE_OTHER): Payer: Self-pay

## 2019-11-23 ENCOUNTER — Other Ambulatory Visit: Payer: Self-pay

## 2019-11-23 ENCOUNTER — Ambulatory Visit
Admission: RE | Admit: 2019-11-23 | Discharge: 2019-11-23 | Disposition: A | Discharge status: Home / Self Care | Payer: 59 | Source: Ambulatory Visit | Attending: Vascular Surgery | Admitting: Vascular Surgery

## 2019-11-23 DIAGNOSIS — I6523 Occlusion and stenosis of bilateral carotid arteries: Secondary | ICD-10-CM | POA: Insufficient documentation

## 2019-11-23 LAB — POCT I-STAT CREATININE: Creatinine, Ser: 0.7 mg/dL (ref 0.44–1.00)

## 2019-11-23 MED ORDER — IOHEXOL 350 MG/ML SOLN
75.0000 mL | Freq: Once | INTRAVENOUS | Status: AC | PRN
  Administered 2019-11-23: 75 mL via INTRAVENOUS

## 2019-11-29 ENCOUNTER — Other Ambulatory Visit: Payer: Self-pay | Admitting: Orthopedic Surgery

## 2019-11-29 DIAGNOSIS — G8929 Other chronic pain: Secondary | ICD-10-CM

## 2019-12-14 ENCOUNTER — Other Ambulatory Visit: Payer: Self-pay

## 2019-12-14 ENCOUNTER — Ambulatory Visit
Admission: RE | Admit: 2019-12-14 | Discharge: 2019-12-14 | Disposition: A | Discharge status: Home / Self Care | Payer: 59 | Source: Ambulatory Visit | Attending: Orthopedic Surgery | Admitting: Orthopedic Surgery

## 2019-12-14 DIAGNOSIS — G8929 Other chronic pain: Secondary | ICD-10-CM

## 2019-12-14 DIAGNOSIS — M5440 Lumbago with sciatica, unspecified side: Secondary | ICD-10-CM | POA: Diagnosis present

## 2020-01-15 NOTE — Progress Notes (Signed)
MRN : [id number]  Sabrina Espinoza is a 54 y.o. (08/04/1965) female who presents with chief complaint of No chief complaint on file. [name]  History of Present Illness:   The patient is seen for follow up evaluation of carotid stenosis status post CT angiogram. CT scan was done 11/23/2019. Patient reports that the test went well with no problems or complications.   On further discussion with the patient she does attest to several episodes of visual changes of the right eye. There is no recent or interval TIA symptoms or focal motor deficits. There is no prior documented CVA.  The patient is taking enteric-coated aspirin 81 mg daily.  There is no history of migraine headaches. There is no history of seizures.  The patient has a history of coronary artery disease, no recent episodes of angina or shortness of breath. The patient denies PAD or claudication symptoms. There is a history of hyperlipidemia which is being treated with a statin.    CT angiogram is reviewed by me personally and shows greater than 90% stenosis consistent with calcified plaque at the origin of the right internal carotid artery.   No outpatient medications have been marked as taking for the 01/16/20 encounter (Appointment) with [name], [name], MD.    Past Medical History:  Diagnosis Date  . Anxiety   . Arthritis    neck  . Asthma   . Bipolar disorder (HCC)   . Chronic constipation    on Linzess, Miralax, Zofran  . Chronic insomnia 11/02/2015  . COPD, moderate (HCC)   . Depression   . Depression with anxiety    Followed by Dr. Lynett Fish, psychiatrist at [facility] and Wilma Flavin for thrapy  . GERD (gastroesophageal reflux disease)   . Hepatitis    Hep C, history, has been treated and cured per pt.  . History of chicken pox   . History of cocaine use   . History of hiatal hernia   . History of pneumonia   . Hypertension   . Hypothyroidism, adult    Working with Dr. Horton Chin,  Endocrinology. S/P RAIU and is planning on biopsy  . Migraine without status migrainosus, not intractable   . Nicotine dependence, uncomplicated   . Nontoxic multinodular goiter    FNA done 10/30/14 with benign results. Seen [name]. Patient may want a second opinion.  . Rhinitis, allergic   . Vitamin D deficiency     Past Surgical History:  Procedure Laterality Date  . ANKLE FRACTURE SURGERY  1988   rods and pins in place  . APPENDECTOMY  04/17/2016   Procedure: APPENDECTOMY;  Surgeon: Leafy Ro, MD;  Location: [facility];  Service: General;;  . CARPAL TUNNEL RELEASE Right 11/24/2016   Procedure: CARPAL TUNNEL RELEASE;  Surgeon: Deeann Saint, MD;  Location: [facility];  Service: Orthopedics;  Laterality: Right;  . CESAREAN SECTION  1996  . COLECTOMY WITH COLOSTOMY CREATION/HARTMANN PROCEDURE N/A 04/17/2016   Procedure: COLOSTOMY CREATION/HARTMANN PROCEDURE;  Surgeon: Leafy Ro, MD;  Location: [facility];  Service: General;  Laterality: N/A;  . ECTOPIC PREGNANCY SURGERY  2003  . LAPAROTOMY N/A 04/17/2016   Procedure: EXPLORATORY LAPAROTOMY;  Surgeon: Leafy Ro, MD;  Location: [facility];  Service: General;  Laterality: N/A;  . NECK SURGERY      Social History Social History   Tobacco Use  . Smoking status: Current Every Day Smoker    Packs/day: 1.00    Years: 32.00  Pack years: 32.00    Types: Cigarettes  . Smokeless tobacco: Never Used  Vaping Use  . Vaping Use: Never used  Substance Use Topics  . Alcohol use: No    Alcohol/week: 0.0 standard drinks  . Drug use: No    Comment: history of cocaine use, clean for 3.5 years     Family History Family History  Problem Relation Age of Onset  . Heart disease Father   . Alcohol abuse Father   . Diabetes Father   . Heart disease Brother   . Depression Brother   . Stroke Brother   . Alcohol abuse Paternal Grandfather   . Osteoporosis Mother   . Breast cancer Neg Hx     Allergies  Allergen Reactions  . Sulfa  Antibiotics     Other reaction(s): Other (See Comments) "feel like Im losing my mind"     REVIEW OF SYSTEMS (Negative unless checked)  Constitutional: [] Weight loss  [] Fever  [] Chills Cardiac: [] Chest pain   [] Chest pressure   [] Palpitations   [] Shortness of breath when laying flat   [] Shortness of breath with exertion. Vascular:  [] Pain in legs with walking   [] Pain in legs at rest  [] History of DVT   [] Phlebitis   [] Swelling in legs   [] Varicose veins   [] Non-healing ulcers Pulmonary:   [] Uses home oxygen   [] Productive cough   [] Hemoptysis   [] Wheeze  [x] COPD   [] Asthma Neurologic:  [] Dizziness   [] Seizures   [] History of stroke   [] History of TIA  [] Aphasia   [x] Vissual changes   [] Weakness or numbness in arm   [] Weakness or numbness in leg Musculoskeletal:   [] Joint swelling   [x] Joint pain   [x] Low back pain Hematologic:  [] Easy bruising  [] Easy bleeding   [] Hypercoagulable state   [] Anemic Gastrointestinal:  [] Diarrhea   [] Vomiting  [x] Gastroesophageal reflux/heartburn   [] Difficulty swallowing. Genitourinary:  [] Chronic kidney disease   [] Difficult urination  [] Frequent urination   [] Blood in urine Skin:  [] Rashes   [] Ulcers  Psychological:  [] History of anxiety   []  History of major depression.  Physical Examination  There were no vitals filed for this visit. There is no height or weight on file to calculate BMI. Gen: WD/WN, NAD Head: [place]/AT, No temporalis wasting.  Ear/Nose/Throat: Hearing grossly intact, nares w/o erythema or drainage Eyes: PER, EOMI, sclera nonicteric.  Neck: Supple, no large masses.   Pulmonary:  Good air movement, no audible wheezing bilaterally, no use of accessory muscles.  Cardiac: RRR, no JVD Vascular: right carotid bruit Vessel Right Left  Radial Palpable Palpable  Carotid Palpable Palpable  Gastrointestinal: Non-distended. No guarding/no peritoneal signs.  Musculoskeletal: M/S 5/5 throughout.  No deformity or atrophy.  Neurologic: CN 2-12  intact. Symmetrical.  Speech is fluent. Motor exam as listed above. Psychiatric: Judgment intact, Mood & affect appropriate for pt's clinical situation. Dermatologic: No rashes or ulcers noted.  No changes consistent with cellulitis.   CBC Lab Results  Component Value Date   WBC 11.4 (H) 12/28/2018   HGB 11.4 (L) 12/28/2018   HCT 34.1 (L) 12/28/2018   MCV 85.9 12/28/2018   PLT 371 12/28/2018    BMET    Component Value Date/Time   NA 138 12/28/2018 0102   NA 138 03/11/2016 1527   NA 137 09/27/2014 1747   K 3.4 (L) 12/28/2018 0102   K 3.7 09/27/2014 1747   CL 100 12/28/2018 0102   CL 99 (L) 09/27/2014 1747   CO2 26 12/28/2018  0102   CO2 29 09/27/2014 1747   GLUCOSE 112 (H) 12/28/2018 0102   GLUCOSE 96 09/27/2014 1747   BUN 7 12/28/2018 0102   BUN 7 03/11/2016 1527   BUN 8 09/27/2014 1747   CREATININE 0.70 11/23/2019 0825   CREATININE 0.82 09/27/2014 1747   CALCIUM 9.5 12/28/2018 0102   CALCIUM 9.2 09/27/2014 1747   GFRNONAA >60 12/28/2018 0102   GFRNONAA >60 09/27/2014 1747   GFRAA >60 12/28/2018 0102   GFRAA >60 09/27/2014 1747   CrCl cannot be calculated (Patient's most recent lab result is older than the maximum 21 days allowed.).  COAG Lab Results  Component Value Date   INR 0.98 06/20/2016    Radiology No results found.   Assessment/Plan 1. Bilateral carotid artery stenosis Recommend:  The patient is symptomatic with respect to the carotid stenosis.  The patient now has progressed and has a lesion the is >90%.  Patient's CT angiography of the carotid arteries confirms >90% right ICA stenosis.  The anatomical considerations support stenting over surgery.  This was discussed in detail with the patient.  The risks, benefits and alternative therapies were reviewed in detail with the patient.  All questions were answered.  The patient agrees to proceed with stenting of the right internal carotid artery.  Continue antiplatelet therapy as  prescribed. Continue management of CAD, HTN and Hyperlipidemia. Healthy heart diet, encouraged exercise at least 4 times per week.   2. COPD, moderate (HCC) Continue pulmonary medications and aerosols as already ordered, these medications have been reviewed and there are no changes at this time.   3. GERD without esophagitis Continue PPI as already ordered, this medication has been reviewed and there are no changes at this time.  Avoidence of caffeine and alcohol  Moderate elevation of the head of the bed   4. Degenerative disc disease, cervical Continue NSAID medications as already ordered, these medications have been reviewed and there are no changes at this time.  Continued activity and therapy was stressed.     [name], MD  01/15/2020 4:59 PM

## 2020-01-15 NOTE — H&P (View-Only) (Signed)
  MRN : [id number]  Sabrina Espinoza is a 53 y.o. (04/17/1966) female who presents with chief complaint of No chief complaint on file. .  History of Present Illness:   The patient is seen for follow up evaluation of carotid stenosis status post CT angiogram. CT scan was done 11/23/2019. Patient reports that the test went well with no problems or complications.   On further discussion with the patient she does attest to several episodes of visual changes of the right eye. There is no recent or interval TIA symptoms or focal motor deficits. There is no prior documented CVA.  The patient is taking enteric-coated aspirin 81 mg daily.  There is no history of migraine headaches. There is no history of seizures.  The patient has a history of coronary artery disease, no recent episodes of angina or shortness of breath. The patient denies PAD or claudication symptoms. There is a history of hyperlipidemia which is being treated with a statin.    CT angiogram is reviewed by me personally and shows greater than 90% stenosis consistent with calcified plaque at the origin of the right internal carotid artery.   No outpatient medications have been marked as taking for the 01/16/20 encounter (Appointment) with Bowen Kia G, MD.    Past Medical History:  Diagnosis Date  . Anxiety   . Arthritis    neck  . Asthma   . Bipolar disorder (HCC)   . Chronic constipation    on Linzess, Miralax, Zofran  . Chronic insomnia 11/02/2015  . COPD, moderate (HCC)   . Depression   . Depression with anxiety    Followed by Dr. Hansen Su, psychiatrist at [facility] and Michelle Lawson for thrapy  . GERD (gastroesophageal reflux disease)   . Hepatitis    Hep C, history, has been treated and cured per pt.  . History of chicken pox   . History of cocaine use   . History of hiatal hernia   . History of pneumonia   . Hypertension   . Hypothyroidism, adult    Working with Dr. [name],  Endocrinology. S/P RAIU and is planning on biopsy  . Migraine without status migrainosus, not intractable   . Nicotine dependence, uncomplicated   . Nontoxic multinodular goiter    FNA done 10/30/14 with benign results. Seen [name]. Patient may want a second opinion.  . Rhinitis, allergic   . Vitamin D deficiency     Past Surgical History:  Procedure Laterality Date  . ANKLE FRACTURE SURGERY  1988   rods and pins in place  . APPENDECTOMY  04/17/2016   Procedure: APPENDECTOMY;  Surgeon: Diego F Pabon, MD;  Location: [facility] OR[facility];  Service: General;;  . CARPAL TUNNEL RELEASE Right 11/24/2016   Procedure: CARPAL TUNNEL RELEASE;  Surgeon: Miller, [name], MD;  Location: [facility] OR[facility];  Service: Orthopedics;  Laterality: Right;  . CESAREAN SECTION  1996  . COLECTOMY WITH COLOSTOMY CREATION/HARTMANN PROCEDURE N/A 04/17/2016   Procedure: COLOSTOMY CREATION/HARTMANN PROCEDURE;  Surgeon: Diego F Pabon, MD;  Location: [facility] OR[facility];  Service: General;  Laterality: N/A;  . ECTOPIC PREGNANCY SURGERY  2003  . LAPAROTOMY N/A 04/17/2016   Procedure: EXPLORATORY LAPAROTOMY;  Surgeon: Diego F Pabon, MD;  Location: [facility] OR[facility];  Service: General;  Laterality: N/A;  . NECK SURGERY      Social History Social History   Tobacco Use  . Smoking status: Current Every Day Smoker    Packs/day: 1.00    Years: 32.00      Pack years: 32.00    Types: Cigarettes  . Smokeless tobacco: Never Used  Vaping Use  . Vaping Use: Never used  Substance Use Topics  . Alcohol use: No    Alcohol/week: 0.0 standard drinks  . Drug use: No    Comment: history of cocaine use, clean for 3.5 years     Family History Family History  Problem Relation Age of Onset  . Heart disease Father   . Alcohol abuse Father   . Diabetes Father   . Heart disease Brother   . Depression Brother   . Stroke Brother   . Alcohol abuse Paternal Grandfather   . Osteoporosis Mother   . Breast cancer Neg Hx     Allergies  Allergen Reactions  . Sulfa  Antibiotics     Other reaction(s): Other (See Comments) "feel like Im losing my mind"     REVIEW OF SYSTEMS (Negative unless checked)  Constitutional: [] Weight loss  [] Fever  [] Chills Cardiac: [] Chest pain   [] Chest pressure   [] Palpitations   [] Shortness of breath when laying flat   [] Shortness of breath with exertion. Vascular:  [] Pain in legs with walking   [] Pain in legs at rest  [] History of DVT   [] Phlebitis   [] Swelling in legs   [] Varicose veins   [] Non-healing ulcers Pulmonary:   [] Uses home oxygen   [] Productive cough   [] Hemoptysis   [] Wheeze  [x] COPD   [] Asthma Neurologic:  [] Dizziness   [] Seizures   [] History of stroke   [] History of TIA  [] Aphasia   [x] Vissual changes   [] Weakness or numbness in arm   [] Weakness or numbness in leg Musculoskeletal:   [] Joint swelling   [x] Joint pain   [x] Low back pain Hematologic:  [] Easy bruising  [] Easy bleeding   [] Hypercoagulable state   [] Anemic Gastrointestinal:  [] Diarrhea   [] Vomiting  [x] Gastroesophageal reflux/heartburn   [] Difficulty swallowing. Genitourinary:  [] Chronic kidney disease   [] Difficult urination  [] Frequent urination   [] Blood in urine Skin:  [] Rashes   [] Ulcers  Psychological:  [] History of anxiety   []  History of major depression.  Physical Examination  There were no vitals filed for this visit. There is no height or weight on file to calculate BMI. Gen: WD/WN, NAD Head: [place]/AT, No temporalis wasting.  Ear/Nose/Throat: Hearing grossly intact, nares w/o erythema or drainage Eyes: PER, EOMI, sclera nonicteric.  Neck: Supple, no large masses.   Pulmonary:  Good air movement, no audible wheezing bilaterally, no use of accessory muscles.  Cardiac: RRR, no JVD Vascular: right carotid bruit Vessel Right Left  Radial Palpable Palpable  Carotid Palpable Palpable  Gastrointestinal: Non-distended. No guarding/no peritoneal signs.  Musculoskeletal: M/S 5/5 throughout.  No deformity or atrophy.  Neurologic: CN 2-12  intact. Symmetrical.  Speech is fluent. Motor exam as listed above. Psychiatric: Judgment intact, Mood & affect appropriate for pt's clinical situation. Dermatologic: No rashes or ulcers noted.  No changes consistent with cellulitis.   CBC Lab Results  Component Value Date   WBC 11.4 (H) 12/28/2018   HGB 11.4 (L) 12/28/2018   HCT 34.1 (L) 12/28/2018   MCV 85.9 12/28/2018   PLT 371 12/28/2018    BMET    Component Value Date/Time   NA 138 12/28/2018 0102   NA 138 03/11/2016 1527   NA 137 09/27/2014 1747   K 3.4 (L) 12/28/2018 0102   K 3.7 09/27/2014 1747   CL 100 12/28/2018 0102   CL 99 (L) 09/27/2014 1747   CO2 26 12/28/2018  0102   CO2 29 09/27/2014 1747   GLUCOSE 112 (H) 12/28/2018 0102   GLUCOSE 96 09/27/2014 1747   BUN 7 12/28/2018 0102   BUN 7 03/11/2016 1527   BUN 8 09/27/2014 1747   CREATININE 0.70 11/23/2019 0825   CREATININE 0.82 09/27/2014 1747   CALCIUM 9.5 12/28/2018 0102   CALCIUM 9.2 09/27/2014 1747   GFRNONAA >60 12/28/2018 0102   GFRNONAA >60 09/27/2014 1747   GFRAA >60 12/28/2018 0102   GFRAA >60 09/27/2014 1747   CrCl cannot be calculated (Patient's most recent lab result is older than the maximum 21 days allowed.).  COAG Lab Results  Component Value Date   INR 0.98 06/20/2016    Radiology No results found.   Assessment/Plan 1. Bilateral carotid artery stenosis Recommend:  The patient is symptomatic with respect to the carotid stenosis.  The patient now has progressed and has a lesion the is >90%.  Patient's CT angiography of the carotid arteries confirms >90% right ICA stenosis.  The anatomical considerations support stenting over surgery.  This was discussed in detail with the patient.  The risks, benefits and alternative therapies were reviewed in detail with the patient.  All questions were answered.  The patient agrees to proceed with stenting of the right internal carotid artery.  Continue antiplatelet therapy as  prescribed. Continue management of CAD, HTN and Hyperlipidemia. Healthy heart diet, encouraged exercise at least 4 times per week.   2. COPD, moderate (HCC) Continue pulmonary medications and aerosols as already ordered, these medications have been reviewed and there are no changes at this time.   3. GERD without esophagitis Continue PPI as already ordered, this medication has been reviewed and there are no changes at this time.  Avoidence of caffeine and alcohol  Moderate elevation of the head of the bed   4. Degenerative disc disease, cervical Continue NSAID medications as already ordered, these medications have been reviewed and there are no changes at this time.  Continued activity and therapy was stressed.     [name], MD  01/15/2020 4:59 PM

## 2020-01-16 ENCOUNTER — Other Ambulatory Visit: Payer: Self-pay

## 2020-01-16 ENCOUNTER — Ambulatory Visit (INDEPENDENT_AMBULATORY_CARE_PROVIDER_SITE_OTHER): Payer: 59 | Admitting: Vascular Surgery

## 2020-01-16 ENCOUNTER — Encounter (INDEPENDENT_AMBULATORY_CARE_PROVIDER_SITE_OTHER): Payer: Self-pay | Admitting: Vascular Surgery

## 2020-01-16 VITALS — BP 125/88 | HR 80 | Ht 69.0 in | Wt 186.0 lb

## 2020-01-16 DIAGNOSIS — K219 Gastro-esophageal reflux disease without esophagitis: Secondary | ICD-10-CM | POA: Diagnosis not present

## 2020-01-16 DIAGNOSIS — J449 Chronic obstructive pulmonary disease, unspecified: Secondary | ICD-10-CM

## 2020-01-16 DIAGNOSIS — M5030 Other cervical disc degeneration, unspecified cervical region: Secondary | ICD-10-CM | POA: Diagnosis not present

## 2020-01-16 DIAGNOSIS — I6523 Occlusion and stenosis of bilateral carotid arteries: Secondary | ICD-10-CM | POA: Diagnosis not present

## 2020-01-16 DIAGNOSIS — S92309A Fracture of unspecified metatarsal bone(s), unspecified foot, initial encounter for closed fracture: Secondary | ICD-10-CM | POA: Insufficient documentation

## 2020-01-18 ENCOUNTER — Telehealth (INDEPENDENT_AMBULATORY_CARE_PROVIDER_SITE_OTHER): Payer: Self-pay

## 2020-01-18 NOTE — Telephone Encounter (Signed)
Spoke with the patient she is scheduled with Dr. [name] for a carotid stent placement on 02/01/20 with a 8:30 am arrival time to the [facility]. Covid testing is on 01/30/20 between 8-1 pm at the [facility]. Pre-procedure instructions were discussed and will be mailed.

## 2020-01-25 ENCOUNTER — Encounter (INDEPENDENT_AMBULATORY_CARE_PROVIDER_SITE_OTHER): Payer: Self-pay | Admitting: Vascular Surgery

## 2020-01-30 ENCOUNTER — Other Ambulatory Visit: Payer: 59

## 2020-01-31 ENCOUNTER — Other Ambulatory Visit: Payer: Self-pay

## 2020-01-31 ENCOUNTER — Other Ambulatory Visit
Admission: RE | Admit: 2020-01-31 | Discharge: 2020-01-31 | Disposition: A | Discharge status: Home / Self Care | Payer: 59 | Source: Ambulatory Visit | Attending: Vascular Surgery | Admitting: Vascular Surgery

## 2020-01-31 ENCOUNTER — Other Ambulatory Visit (INDEPENDENT_AMBULATORY_CARE_PROVIDER_SITE_OTHER): Payer: Self-pay | Admitting: Nurse Practitioner

## 2020-01-31 LAB — SARS CORONAVIRUS 2 (TAT 6-24 HRS): SARS Coronavirus 2: NEGATIVE

## 2020-01-31 NOTE — Telephone Encounter (Signed)
I was informed that that the patient had not had her covid test for her procedure.  I just called her and she didn't do the covid test because she has been vaccinated..... I explained that she would still need to do covid testing and if possible she need to have that done today before 12 pm, and if not we will need to reschedule her procedure. Patient stated she would try.

## 2020-02-01 ENCOUNTER — Inpatient Hospital Stay
Admission: RE | Admit: 2020-02-01 | Discharge: 2020-02-02 | DRG: 036 | Disposition: A | Discharge status: Home / Self Care | Payer: 59 | Attending: Vascular Surgery | Admitting: Vascular Surgery

## 2020-02-01 ENCOUNTER — Encounter
Admission: RE | Disposition: A | Discharge status: Home / Self Care | Payer: Self-pay | Source: Home / Self Care | Attending: Vascular Surgery

## 2020-02-01 ENCOUNTER — Encounter: Payer: Self-pay | Admitting: Vascular Surgery

## 2020-02-01 DIAGNOSIS — Z8249 Family history of ischemic heart disease and other diseases of the circulatory system: Secondary | ICD-10-CM

## 2020-02-01 DIAGNOSIS — Z823 Family history of stroke: Secondary | ICD-10-CM

## 2020-02-01 DIAGNOSIS — Z8619 Personal history of other infectious and parasitic diseases: Secondary | ICD-10-CM

## 2020-02-01 DIAGNOSIS — M47812 Spondylosis without myelopathy or radiculopathy, cervical region: Secondary | ICD-10-CM | POA: Diagnosis present

## 2020-02-01 DIAGNOSIS — Z882 Allergy status to sulfonamides status: Secondary | ICD-10-CM

## 2020-02-01 DIAGNOSIS — F17210 Nicotine dependence, cigarettes, uncomplicated: Secondary | ICD-10-CM | POA: Diagnosis present

## 2020-02-01 DIAGNOSIS — I6523 Occlusion and stenosis of bilateral carotid arteries: Secondary | ICD-10-CM | POA: Diagnosis present

## 2020-02-01 DIAGNOSIS — K219 Gastro-esophageal reflux disease without esophagitis: Secondary | ICD-10-CM | POA: Diagnosis present

## 2020-02-01 DIAGNOSIS — E039 Hypothyroidism, unspecified: Secondary | ICD-10-CM | POA: Diagnosis present

## 2020-02-01 DIAGNOSIS — Z79899 Other long term (current) drug therapy: Secondary | ICD-10-CM | POA: Diagnosis not present

## 2020-02-01 DIAGNOSIS — I2510 Atherosclerotic heart disease of native coronary artery without angina pectoris: Secondary | ICD-10-CM | POA: Diagnosis present

## 2020-02-01 DIAGNOSIS — I6521 Occlusion and stenosis of right carotid artery: Secondary | ICD-10-CM

## 2020-02-01 DIAGNOSIS — K5909 Other constipation: Secondary | ICD-10-CM | POA: Diagnosis present

## 2020-02-01 DIAGNOSIS — Z7982 Long term (current) use of aspirin: Secondary | ICD-10-CM

## 2020-02-01 DIAGNOSIS — I10 Essential (primary) hypertension: Secondary | ICD-10-CM | POA: Diagnosis present

## 2020-02-01 DIAGNOSIS — Z20822 Contact with and (suspected) exposure to covid-19: Secondary | ICD-10-CM | POA: Diagnosis present

## 2020-02-01 DIAGNOSIS — E785 Hyperlipidemia, unspecified: Secondary | ICD-10-CM | POA: Diagnosis present

## 2020-02-01 DIAGNOSIS — M542 Cervicalgia: Secondary | ICD-10-CM | POA: Diagnosis present

## 2020-02-01 DIAGNOSIS — J449 Chronic obstructive pulmonary disease, unspecified: Secondary | ICD-10-CM | POA: Diagnosis present

## 2020-02-01 DIAGNOSIS — I6529 Occlusion and stenosis of unspecified carotid artery: Secondary | ICD-10-CM | POA: Diagnosis present

## 2020-02-01 DIAGNOSIS — G453 Amaurosis fugax: Secondary | ICD-10-CM | POA: Diagnosis not present

## 2020-02-01 HISTORY — PX: CAROTID PTA/STENT INTERVENTION: CATH118231

## 2020-02-01 LAB — POCT ACTIVATED CLOTTING TIME: Activated Clotting Time: 213 seconds

## 2020-02-01 LAB — BUN: BUN: 7 mg/dL (ref 6–20)

## 2020-02-01 LAB — GLUCOSE, CAPILLARY: Glucose-Capillary: 111 mg/dL — ABNORMAL HIGH (ref 70–99)

## 2020-02-01 LAB — CREATININE, SERUM
Creatinine, Ser: 0.7 mg/dL (ref 0.44–1.00)
GFR calc Af Amer: >60 mL/min (ref >60)
GFR calc non Af Amer: >60 mL/min (ref >60)

## 2020-02-01 SURGERY — CAROTID PTA/STENT INTERVENTION
Anesthesia: Moderate Sedation | Laterality: Right

## 2020-02-01 MED ORDER — PROMETHAZINE HCL 25 MG PO TABS
25.0000 mg | ORAL_TABLET | Freq: Four times a day (QID) | ORAL | Status: DC | PRN
  Filled 2020-02-01: qty 1

## 2020-02-01 MED ORDER — MONTELUKAST SODIUM 10 MG PO TABS
10.0000 mg | ORAL_TABLET | Freq: Every day | ORAL | Status: DC
  Administered 2020-02-01: 10 mg via ORAL
  Filled 2020-02-01: qty 1

## 2020-02-01 MED ORDER — HYDROXYZINE HCL 25 MG PO TABS
50.0000 mg | ORAL_TABLET | Freq: Two times a day (BID) | ORAL | Status: DC
  Administered 2020-02-01 – 2020-02-02 (×2): 50 mg via ORAL
  Filled 2020-02-01 (×2): qty 2

## 2020-02-01 MED ORDER — GUAIFENESIN-DM 100-10 MG/5ML PO SYRP
15.0000 mL | ORAL_SOLUTION | ORAL | Status: DC | PRN
  Filled 2020-02-01: qty 15

## 2020-02-01 MED ORDER — HALOPERIDOL 5 MG PO TABS
10.0000 mg | ORAL_TABLET | Freq: Three times a day (TID) | ORAL | Status: DC
  Administered 2020-02-02: 10 mg via ORAL
  Filled 2020-02-01 (×4): qty 2

## 2020-02-01 MED ORDER — PHENYLEPHRINE HCL (PRESSORS) 10 MG/ML IV SOLN
INTRAVENOUS | Status: AC
  Filled 2020-02-01: qty 1

## 2020-02-01 MED ORDER — ACETAMINOPHEN 325 MG PO TABS: 325.0000 mg | ORAL_TABLET | ORAL | Status: DC | PRN

## 2020-02-01 MED ORDER — PANTOPRAZOLE SODIUM 40 MG PO TBEC: 40.0000 mg | DELAYED_RELEASE_TABLET | Freq: Every day | ORAL | Status: DC

## 2020-02-01 MED ORDER — MIDAZOLAM HCL 2 MG/ML PO SYRP: 8.0000 mg | ORAL_SOLUTION | Freq: Once | ORAL | Status: DC | PRN

## 2020-02-01 MED ORDER — ATROPINE SULFATE 1 MG/10ML IJ SOSY
PREFILLED_SYRINGE | INTRAMUSCULAR | Status: DC | PRN
  Administered 2020-02-01: 1 mg via INTRAVENOUS

## 2020-02-01 MED ORDER — PANCRELIPASE (LIP-PROT-AMYL) 12000-38000 UNITS PO CPEP
24000.0000 [IU] | ORAL_CAPSULE | Freq: Every day | ORAL | Status: DC
  Administered 2020-02-02: 24000 [IU] via ORAL
  Filled 2020-02-01: qty 2

## 2020-02-01 MED ORDER — DOCUSATE SODIUM 100 MG PO CAPS
100.0000 mg | ORAL_CAPSULE | Freq: Every day | ORAL | Status: DC
  Filled 2020-02-01: qty 1

## 2020-02-01 MED ORDER — ACETAMINOPHEN 325 MG RE SUPP
325.0000 mg | RECTAL | Status: DC | PRN
  Filled 2020-02-01: qty 2

## 2020-02-01 MED ORDER — GABAPENTIN 400 MG PO CAPS
400.0000 mg | ORAL_CAPSULE | Freq: Three times a day (TID) | ORAL | Status: DC
  Administered 2020-02-01 – 2020-02-02 (×2): 400 mg via ORAL
  Filled 2020-02-01 (×2): qty 1

## 2020-02-01 MED ORDER — ONDANSETRON HCL 4 MG/2ML IJ SOLN: 4.0000 mg | Freq: Four times a day (QID) | INTRAMUSCULAR | Status: DC | PRN

## 2020-02-01 MED ORDER — ATROPINE SULFATE 1 MG/10ML IJ SOSY
PREFILLED_SYRINGE | INTRAMUSCULAR | Status: AC
  Filled 2020-02-01: qty 10

## 2020-02-01 MED ORDER — ALBUTEROL SULFATE (2.5 MG/3ML) 0.083% IN NEBU: 2.5000 mg | INHALATION_SOLUTION | RESPIRATORY_TRACT | Status: DC | PRN

## 2020-02-01 MED ORDER — PANTOPRAZOLE SODIUM 40 MG IV SOLR
40.0000 mg | Freq: Every day | INTRAVENOUS | Status: DC
  Administered 2020-02-01: 40 mg via INTRAVENOUS
  Filled 2020-02-01: qty 40

## 2020-02-01 MED ORDER — SODIUM CHLORIDE 0.9 % IV SOLN: 500.0000 mL | Freq: Once | INTRAVENOUS | Status: DC | PRN

## 2020-02-01 MED ORDER — CEFAZOLIN SODIUM-DEXTROSE 2-4 GM/100ML-% IV SOLN
2.0000 g | Freq: Three times a day (TID) | INTRAVENOUS | Status: AC
  Administered 2020-02-01 – 2020-02-02 (×2): 2 g via INTRAVENOUS
  Filled 2020-02-01 (×2): qty 100

## 2020-02-01 MED ORDER — SODIUM CHLORIDE 0.9 % IV SOLN: INTRAVENOUS | Status: DC

## 2020-02-01 MED ORDER — TOPIRAMATE 25 MG PO TABS
50.0000 mg | ORAL_TABLET | Freq: Every day | ORAL | Status: DC
  Filled 2020-02-01: qty 2

## 2020-02-01 MED ORDER — DIPHENHYDRAMINE HCL 50 MG/ML IJ SOLN: 50.0000 mg | Freq: Once | INTRAMUSCULAR | Status: DC | PRN

## 2020-02-01 MED ORDER — LEVOTHYROXINE SODIUM 75 MCG PO TABS
75.0000 ug | ORAL_TABLET | Freq: Every day | ORAL | Status: DC
  Administered 2020-02-02: 75 ug via ORAL
  Filled 2020-02-01: qty 1
  Filled 2020-02-01: qty 3

## 2020-02-01 MED ORDER — PHENOL 1.4 % MT LIQD
1.0000 | OROMUCOSAL | Status: DC | PRN
  Filled 2020-02-01: qty 177

## 2020-02-01 MED ORDER — CEFAZOLIN SODIUM-DEXTROSE 2-4 GM/100ML-% IV SOLN
INTRAVENOUS | Status: AC
  Administered 2020-02-01: 2 g via INTRAVENOUS
  Filled 2020-02-01: qty 100

## 2020-02-01 MED ORDER — KCL IN DEXTROSE-NACL 20-5-0.9 MEQ/L-%-% IV SOLN
INTRAVENOUS | Status: DC
  Filled 2020-02-01 (×3): qty 1000

## 2020-02-01 MED ORDER — TIZANIDINE HCL 4 MG PO TABS
4.0000 mg | ORAL_TABLET | Freq: Three times a day (TID) | ORAL | Status: DC
  Administered 2020-02-02: 4 mg via ORAL
  Filled 2020-02-01 (×4): qty 1

## 2020-02-01 MED ORDER — DOCUSATE SODIUM 100 MG PO CAPS: 200.0000 mg | ORAL_CAPSULE | Freq: Every day | ORAL | Status: DC

## 2020-02-01 MED ORDER — OXYCODONE HCL 5 MG PO TABS
5.0000 mg | ORAL_TABLET | ORAL | Status: DC | PRN
  Administered 2020-02-01: 10 mg via ORAL
  Administered 2020-02-02: 5 mg via ORAL
  Filled 2020-02-01: qty 2
  Filled 2020-02-01: qty 1

## 2020-02-01 MED ORDER — CEFAZOLIN SODIUM-DEXTROSE 2-4 GM/100ML-% IV SOLN: 2.0000 g | Freq: Once | INTRAVENOUS | Status: AC

## 2020-02-01 MED ORDER — METOPROLOL TARTRATE 5 MG/5ML IV SOLN: 2.0000 mg | INTRAVENOUS | Status: DC | PRN

## 2020-02-01 MED ORDER — DOPAMINE-DEXTROSE 3.2-5 MG/ML-% IV SOLN
INTRAVENOUS | Status: AC
  Filled 2020-02-01: qty 250

## 2020-02-01 MED ORDER — FENTANYL CITRATE (PF) 100 MCG/2ML IJ SOLN
INTRAMUSCULAR | Status: AC
  Filled 2020-02-01: qty 2

## 2020-02-01 MED ORDER — METOPROLOL SUCCINATE ER 50 MG PO TB24
50.0000 mg | ORAL_TABLET | Freq: Every day | ORAL | Status: DC
  Administered 2020-02-02: 50 mg via ORAL
  Filled 2020-02-01: qty 1

## 2020-02-01 MED ORDER — HYDRALAZINE HCL 20 MG/ML IJ SOLN: 5.0000 mg | INTRAMUSCULAR | Status: DC | PRN

## 2020-02-01 MED ORDER — FAMOTIDINE 20 MG PO TABS: 40.0000 mg | ORAL_TABLET | Freq: Once | ORAL | Status: DC | PRN

## 2020-02-01 MED ORDER — MIDAZOLAM HCL 5 MG/5ML IJ SOLN
INTRAMUSCULAR | Status: AC
  Filled 2020-02-01: qty 5

## 2020-02-01 MED ORDER — HEPARIN SODIUM (PORCINE) 1000 UNIT/ML IJ SOLN
INTRAMUSCULAR | Status: AC
  Filled 2020-02-01: qty 1

## 2020-02-01 MED ORDER — CLONAZEPAM 1 MG PO TABS
1.0000 mg | ORAL_TABLET | Freq: Three times a day (TID) | ORAL | Status: DC
  Administered 2020-02-02: 1 mg via ORAL
  Filled 2020-02-01 (×2): qty 1

## 2020-02-01 MED ORDER — ASPIRIN EC 81 MG PO TBEC
81.0000 mg | DELAYED_RELEASE_TABLET | Freq: Every day | ORAL | Status: DC
  Administered 2020-02-02: 81 mg via ORAL
  Filled 2020-02-01: qty 1

## 2020-02-01 MED ORDER — ROPINIROLE HCL 1 MG PO TABS
2.0000 mg | ORAL_TABLET | Freq: Two times a day (BID) | ORAL | Status: DC
  Administered 2020-02-01 – 2020-02-02 (×2): 2 mg via ORAL
  Filled 2020-02-01 (×2): qty 2

## 2020-02-01 MED ORDER — OLANZAPINE 5 MG PO TBDP
15.0000 mg | ORAL_TABLET | Freq: Every day | ORAL | Status: DC
  Filled 2020-02-01 (×2): qty 1

## 2020-02-01 MED ORDER — IODIXANOL 320 MG/ML IV SOLN
INTRAVENOUS | Status: DC | PRN
  Administered 2020-02-01: 55 mL

## 2020-02-01 MED ORDER — SODIUM CHLORIDE 0.9 % IV BOLUS
1000.0000 mL | Freq: Once | INTRAVENOUS | Status: AC
  Administered 2020-02-01: 1000 mL via INTRAVENOUS

## 2020-02-01 MED ORDER — MIDAZOLAM HCL 2 MG/2ML IJ SOLN
INTRAMUSCULAR | Status: DC | PRN
  Administered 2020-02-01: 2 mg via INTRAVENOUS
  Administered 2020-02-01: 0.5 mg via INTRAVENOUS

## 2020-02-01 MED ORDER — CLOPIDOGREL BISULFATE 75 MG PO TABS
75.0000 mg | ORAL_TABLET | Freq: Every day | ORAL | Status: DC
  Administered 2020-02-02: 75 mg via ORAL
  Filled 2020-02-01: qty 1

## 2020-02-01 MED ORDER — AZELASTINE HCL 0.1 % NA SOLN
1.0000 | Freq: Two times a day (BID) | NASAL | Status: DC
  Administered 2020-02-01 – 2020-02-02 (×2): 1 via NASAL
  Filled 2020-02-01: qty 30

## 2020-02-01 MED ORDER — HEPARIN SODIUM (PORCINE) 1000 UNIT/ML IJ SOLN
INTRAMUSCULAR | Status: DC | PRN
  Administered 2020-02-01: 3000 [IU] via INTRAVENOUS
  Administered 2020-02-01: 7000 [IU] via INTRAVENOUS

## 2020-02-01 MED ORDER — CLOPIDOGREL BISULFATE 75 MG PO TABS: 450.0000 mg | ORAL_TABLET | ORAL | Status: AC

## 2020-02-01 MED ORDER — LABETALOL HCL 5 MG/ML IV SOLN: 10.0000 mg | INTRAVENOUS | Status: DC | PRN

## 2020-02-01 MED ORDER — MORPHINE SULFATE (PF) 4 MG/ML IV SOLN: 2.0000 mg | INTRAVENOUS | Status: DC | PRN

## 2020-02-01 MED ORDER — ALUM & MAG HYDROXIDE-SIMETH 200-200-20 MG/5ML PO SUSP: 15.0000 mL | ORAL | Status: DC | PRN

## 2020-02-01 MED ORDER — PRAZOSIN HCL 5 MG PO CAPS
5.0000 mg | ORAL_CAPSULE | Freq: Every day | ORAL | Status: DC
  Filled 2020-02-01 (×2): qty 1

## 2020-02-01 MED ORDER — LORATADINE 10 MG PO TABS
10.0000 mg | ORAL_TABLET | Freq: Every day | ORAL | Status: DC
  Administered 2020-02-02: 10 mg via ORAL
  Filled 2020-02-01: qty 1

## 2020-02-01 MED ORDER — IBUPROFEN-DIPHENHYDRAMINE HCL 200-25 MG PO CAPS: 2.0000 | ORAL_CAPSULE | Freq: Every evening | ORAL | Status: DC | PRN

## 2020-02-01 MED ORDER — HYDROMORPHONE HCL 1 MG/ML IJ SOLN: 1.0000 mg | Freq: Once | INTRAMUSCULAR | Status: DC | PRN

## 2020-02-01 MED ORDER — FLUTICASONE PROPIONATE 50 MCG/ACT NA SUSP
1.0000 | Freq: Two times a day (BID) | NASAL | Status: DC
  Administered 2020-02-02: 1 via NASAL
  Filled 2020-02-01: qty 16

## 2020-02-01 MED ORDER — ACETAMINOPHEN 500 MG PO TABS: 1000.0000 mg | ORAL_TABLET | Freq: Four times a day (QID) | ORAL | Status: DC | PRN

## 2020-02-01 MED ORDER — METHYLPREDNISOLONE SODIUM SUCC 125 MG IJ SOLR: 125.0000 mg | Freq: Once | INTRAMUSCULAR | Status: DC | PRN

## 2020-02-01 MED ORDER — FENTANYL CITRATE (PF) 100 MCG/2ML IJ SOLN
INTRAMUSCULAR | Status: DC | PRN
  Administered 2020-02-01: 50 ug via INTRAVENOUS
  Administered 2020-02-01: 25 ug via INTRAVENOUS

## 2020-02-01 MED ORDER — MOMETASONE FURO-FORMOTEROL FUM 200-5 MCG/ACT IN AERO
2.0000 | INHALATION_SPRAY | Freq: Two times a day (BID) | RESPIRATORY_TRACT | Status: DC
  Administered 2020-02-01 – 2020-02-02 (×2): 2 via RESPIRATORY_TRACT
  Filled 2020-02-01: qty 8.8

## 2020-02-01 SURGICAL SUPPLY — 20 items
BALLN VIATRAC 5X30X135 (BALLOONS) ×3
BALLOON VIATRAC 5X30X135 (BALLOONS) ×1 IMPLANT
CATH ANGIO 5F 100CM .035 PIG (CATHETERS) ×3 IMPLANT
CATH BEACON 5 .035 100 H1 TIP (CATHETERS) ×3 IMPLANT
DEVICE EMBOSHIELD NAV6 4.0-7.0 (FILTER) ×3 IMPLANT
DEVICE PRESTO INFLATION (MISCELLANEOUS) ×3 IMPLANT
DEVICE STARCLOSE SE CLOSURE (Vascular Products) ×3 IMPLANT
DEVICE TORQUE .025-.038 (MISCELLANEOUS) ×3 IMPLANT
GLIDEWIRE ANGLED SS 035X260CM (WIRE) ×3 IMPLANT
KIT CAROTID MANIFOLD (MISCELLANEOUS) ×3 IMPLANT
NEEDLE ENTRY 21GA 7CM ECHOTIP (NEEDLE) ×3 IMPLANT
PACK ANGIOGRAPHY (CUSTOM PROCEDURE TRAY) ×3 IMPLANT
SET INTRO CAPELLA COAXIAL (SET/KITS/TRAYS/PACK) ×3 IMPLANT
SHEATH BRITE TIP 6FRX11 (SHEATH) ×3 IMPLANT
SHEATH SHUTTLE 6FR (SHEATH) ×3 IMPLANT
STENT XACT CAR 10-8X40X136 (Permanent Stent) ×3 IMPLANT
SYR MEDRAD MARK 7 150ML (SYRINGE) ×3 IMPLANT
TUBING CONTRAST HIGH PRESS 72 (TUBING) ×3 IMPLANT
WIRE G VAS 035X260 STIFF (WIRE) ×3 IMPLANT
WIRE J 3MM .035X145CM (WIRE) ×3 IMPLANT

## 2020-02-01 NOTE — H&P (Signed)
[facility] History & Physical Update  The patient was interviewed and re-examined.  The patient's previous History and Physical has been reviewed and is unchanged.  There is no change in the plan of care. We plan to proceed with the scheduled procedure.  [name], MD  02/01/2020, 9:42 AM

## 2020-02-01 NOTE — Op Note (Signed)
OPERATIVE NOTE DATE: 02/01/2020  PROCEDURE: 1.  Ultrasound guidance for vascular access right femoral artery 2.  Placement of a 10 x 8 x 40 exact stent with the use of the NAV-6 embolic protection device in the right internal carotid artery  PRE-OPERATIVE DIAGNOSIS: 1.  Symptomatic right internal carotid artery stenosis. 2.  Visual changes/amaurosis fugax  POST-OPERATIVE DIAGNOSIS:  Same as above  SURGEON: [name]  ASSISTANT(S): Annice Needy, MD  ANESTHESIA: local/MCS  ESTIMATED BLOOD LOSS: 100 cc  CONTRAST: 55 cc  FLUORO TIME: 4.5 minutes  MODERATE CONSCIOUS SEDATION TIME: Continuous ECG pulse oximetry and cardiopulmonary monitoring was performed throughout the entire procedure by the interventional radiology nurse total sedation time was 35 minutes  FINDING(S): 1.   Greater than 90% right carotid artery stenosis  SPECIMEN(S):   none  INDICATIONS:   Patient is a 54 y.o. female who presents with symptomatic right internal carotid artery stenosis.  The patient has a very high bifurcation and severe degenerative joint disease of the cervical spine she also has profound COPD and carotid artery stenting was felt to be preferred to endarterectomy for that reason.  Risks and benefits were discussed and informed consent was obtained.   DESCRIPTION: After obtaining full informed written consent, the patient was brought back to the vascular suite and placed supine upon the table.  The patient received IV antibiotics prior to induction. Moderate conscious sedation was administered during a face to face encounter with the patient throughout the procedure with my supervision of the RN administering medicines and monitoring the patients vital signs and mental status throughout from the start of the procedure until the patient was taken to the recovery room.    After obtaining adequate sedation, the patient was prepped and draped in the standard fashion.    A first assistant is  required in order to allow for a safe and more efficient operation.  Duties include wire manipulations as well as assistance with pinning the sheath and positioning the detector for proper angle, assistance and deploying the stent in the proper position and appropriate images.  Further duties include assisting with patient positioning during the procedure.  I believe that this procedure requires a first assistant in order for it to be performed at a level in keeping with the high standards of this institution.  The right femoral artery was visualized with ultrasound and found to be widely patent. It was then accessed under direct ultrasound guidance without difficulty with a micropuncture needle. A permanent image was recorded.  A microwire was then advanced without difficulty under fluoroscopic guidance followed by a micro-sheath.  A J-wire was placed and we then placed a 6 French sheath. The patient was then heparinized and a total of 9000 units of intravenous heparin were given and an ACT was checked to confirm successful anticoagulation.   A pigtail catheter was then placed into the ascending aorta. This showed type I arch no hemodynamically significant stenosis of the ostia of the great vessels. The right common carotid artery was then selectively cannulated without difficulty with a H1 catheter and the catheter advanced into the mid right common carotid artery.  Cervical and cerebral carotid angiography was then performed. There were no obvious intracranial filling defects. The carotid bifurcation demonstrated greater than 90%.  I then advanced into the external carotid artery with a Glidewire and the H1 catheter and then exchanged for the Amplatz Super Stiff wire. Over the Amplatz Super Stiff wire, a 6 [place] shuttle sheath  was placed into the mid common carotid artery. I then used the NAV-6  Embolic protection device and crossed the lesion and parked this in the distal internal carotid artery at the base  of the skull.  I then selected a 10 x 8 x 40 exact stent. This was deployed across the lesion encompassing it in its entirety. A 5 mm x 30 mm length balloon was used to post dilate the stent. Only about a 15% residual stenosis was present after angioplasty. Completion angiogram showed normal intracranial filling without new defects. At this point I elected to terminate the procedure. The sheath was removed and StarClose closure device was deployed in the right femoral artery with excellent hemostatic result. The patient was taken to the recovery room in stable condition having tolerated the procedure well.  COMPLICATIONS: none  CONDITION: stable  [name] 02/01/2020 11:00 AM   This note was created with Dragon Medical transcription system. Any errors in dictation are purely unintentional.

## 2020-02-01 NOTE — Progress Notes (Signed)
Report received from Sonia Side, RN and care assumed.  Pt hard to arouse, clammy.  BG checked and is 111.  Bp checked and is low in the 80's. Festus Barren, MD notified and IVF bolus started.  Some meds held as well.

## 2020-02-02 ENCOUNTER — Encounter: Payer: Self-pay | Admitting: Vascular Surgery

## 2020-02-02 DIAGNOSIS — I6521 Occlusion and stenosis of right carotid artery: Secondary | ICD-10-CM

## 2020-02-02 LAB — CBC
HCT: 29.4 % — ABNORMAL LOW (ref 36.0–46.0)
Hemoglobin: 9.5 g/dL — ABNORMAL LOW (ref 12.0–15.0)
MCH: 29.1 pg (ref 26.0–34.0)
MCHC: 32.3 g/dL (ref 30.0–36.0)
MCV: 89.9 fL (ref 80.0–100.0)
Platelets: 246 10*3/uL (ref 150–400)
RBC: 3.27 MIL/uL — ABNORMAL LOW (ref 3.87–5.11)
RDW: 14.5 % (ref 11.5–15.5)
WBC: 11.4 10*3/uL — ABNORMAL HIGH (ref 4.0–10.5)
nRBC: 0 % (ref 0.0–0.2)

## 2020-02-02 LAB — BASIC METABOLIC PANEL
Anion gap: 7 (ref 5–15)
BUN: 8 mg/dL (ref 6–20)
CO2: 22 mmol/L (ref 22–32)
Calcium: 9 mg/dL (ref 8.9–10.3)
Chloride: 111 mmol/L (ref 98–111)
Creatinine, Ser: 0.89 mg/dL (ref 0.44–1.00)
GFR calc Af Amer: >60 mL/min (ref >60)
GFR calc non Af Amer: >60 mL/min (ref >60)
Glucose, Bld: 124 mg/dL — ABNORMAL HIGH (ref 70–99)
Potassium: 3.5 mmol/L (ref 3.5–5.1)
Sodium: 140 mmol/L (ref 135–145)

## 2020-02-02 MED ORDER — OXYCODONE HCL 5 MG PO TABS
5.0000 mg | ORAL_TABLET | Freq: Four times a day (QID) | ORAL | Status: DC | PRN
  Administered 2020-02-02: 5 mg via ORAL
  Filled 2020-02-02: qty 1

## 2020-02-02 MED ORDER — ASPIRIN 81 MG PO TBEC: 81.0000 mg | DELAYED_RELEASE_TABLET | Freq: Every day | ORAL | 3 refills | Status: AC

## 2020-02-02 MED ORDER — CLOPIDOGREL BISULFATE 75 MG PO TABS: 75.0000 mg | ORAL_TABLET | Freq: Every day | ORAL | 3 refills | Status: AC

## 2020-02-02 NOTE — Discharge Instructions (Signed)
Vascular Surgery Discharge Instructions:  1) You may shower as of tomorrow.  Please keep your groins clean and dry. 2) No strenuous activity or lifting heavier than 10 pounds for 3 weeks. 3) To protect your stent, please take aspirin and Plavix daily

## 2020-02-02 NOTE — Discharge Summary (Signed)
[facility]    Discharge Summary   Patient ID:  Sabrina Espinoza MRN: [id number] DOB/AGE: [birth year] 54 y.o.  Admit date: 02/01/2020 Discharge date: 02/02/2020 Date of Surgery: 02/01/2020 Surgeon: Surgeon(s): Schnier, [name], MD  Admission Diagnosis: Symptomatic carotid artery narrowing without infarction [I65.29]  Discharge Diagnoses:  Symptomatic carotid artery narrowing without infarction [I65.29]  Secondary Diagnoses: Past Medical History:  Diagnosis Date  . Anxiety   . Arthritis    neck  . Asthma   . Bipolar disorder (HCC)   . Chronic constipation    on Linzess, Miralax, Zofran  . Chronic insomnia 11/02/2015  . COPD, moderate (HCC)   . Depression   . Depression with anxiety    Followed by Dr. Lynett Fish, psychiatrist at [facility] and Wilma Flavin for thrapy  . GERD (gastroesophageal reflux disease)   . Hepatitis    Hep C, history, has been treated and cured per pt.  . History of chicken pox   . History of cocaine use   . History of hiatal hernia   . History of pneumonia   . Hypertension   . Hypothyroidism, adult    Working with Dr. Horton Chin, Endocrinology. S/P RAIU and is planning on biopsy  . Migraine without status migrainosus, not intractable   . Nicotine dependence, uncomplicated   . Nontoxic multinodular goiter    FNA done 10/30/14 with benign results. Seen [name]. Patient may want a second opinion.  . Rhinitis, allergic   . Vitamin D deficiency    Procedure(s): 02/01/20: 1.  Ultrasound guidance for vascular access right femoral artery 2.  Placement of a 10 x 8 x 40 exact stent with the use of the NAV-6 embolic protection device in the right internal carotid artery  Discharged Condition: Good  HPI / Hospital Course:  Patient is a 54 y.o. female who presents with symptomatic right internal carotid artery stenosis.  The patient has a very high bifurcation and severe degenerative joint disease of the  cervical spine she also has profound COPD and carotid artery stenting was felt to be preferred to endarterectomy for that reason.  January 24, 2020 the patient underwent:  1. Ultrasound guidance for vascular access right femoral artery 2.  Placement of a 10 x 8 x 40 exact stent with the use of the NAV-6 embolic protection device in the right internal carotid artery  She tolerated the procedure well and was transferred from recovery room to the surgical floor without issue.  Patient's night of procedure was unremarkable.  During the patient's brief stay, her diet was advanced her pain was controlled to the use of p.o. pain medication she was urinating on her own and she was ambulating at baseline.  Day of discharge, patient was afebrile with stable vital signs and essentially unremarkable physical exam.  Physical exam:  A&Ox3, NAD Neck:   Trachea midline  No swelling or drainage noted Cardiovascular: Regular rate and rhythm Pulmonary: Clear to auscultation bilaterally Abdomen: Soft, nontender nondistended Right groin:  Access site: Clean dry intact.  No swelling or drainage noted. Extremities: Warm distally to toes Neuro: 5/5 upper/lower no deficits  Labs: As below  Complications: None  Consults: None  Significant Diagnostic Studies: CBC Lab Results  Component Value Date   WBC 11.4 (H) 02/02/2020   HGB 9.5 (L) 02/02/2020   HCT 29.4 (L) 02/02/2020   MCV 89.9 02/02/2020   PLT 246 02/02/2020   BMET    Component Value Date/Time   NA  140 02/02/2020 0341   NA 138 03/11/2016 1527   NA 137 09/27/2014 1747   K 3.5 02/02/2020 0341   K 3.7 09/27/2014 1747   CL 111 02/02/2020 0341   CL 99 (L) 09/27/2014 1747   CO2 22 02/02/2020 0341   CO2 29 09/27/2014 1747   GLUCOSE 124 (H) 02/02/2020 0341   GLUCOSE 96 09/27/2014 1747   BUN 8 02/02/2020 0341   BUN 7 03/11/2016 1527   BUN 8 09/27/2014 1747   CREATININE 0.89 02/02/2020 0341   CREATININE 0.82 09/27/2014 1747   CALCIUM 9.0  02/02/2020 0341   CALCIUM 9.2 09/27/2014 1747   GFRNONAA >60 02/02/2020 0341   GFRNONAA >60 09/27/2014 1747   GFRAA >60 02/02/2020 0341   GFRAA >60 09/27/2014 1747   COAG Lab Results  Component Value Date   INR 0.98 06/20/2016   Disposition:  Discharge to :Home  Allergies as of 02/02/2020      Reactions   Sulfa Antibiotics    Other reaction(s): Other (See Comments) "feel like Im losing my mind"      Medication List    TAKE these medications   acetaminophen 500 MG tablet Commonly known as: TYLENOL Take 1,000 mg by mouth every 6 (six) hours as needed for moderate pain or headache.   aspirin 81 MG EC tablet Take 1 tablet (81 mg total) by mouth daily. Swallow whole. Start taking on: February 03, 2020   azelastine 0.1 % nasal spray Commonly known as: ASTELIN Place 1 spray into the nose 2 (two) times daily. Use in each nostril as directed   BC HEADACHE PO Take 1 packet by mouth 3 (three) times daily as needed (headache).   budesonide-formoterol 160-4.5 MCG/ACT inhaler Commonly known as: Symbicort Inhale 2 puffs into the lungs 2 (two) times daily. USE 2 PUFFS 2 TIMES A DAY What changed: additional instructions   cetirizine 10 MG tablet Commonly known as: ZYRTEC Take 10 mg by mouth daily.   clonazePAM 1 MG tablet Commonly known as: KLONOPIN Take 1 mg by mouth 3 (three) times daily.   clopidogrel 75 MG tablet Commonly known as: PLAVIX Take 1 tablet (75 mg total) by mouth daily at 6 (six) AM. Start taking on: February 03, 2020   docusate sodium 100 MG capsule Commonly known as: COLACE Take 200 mg by mouth daily.   fluticasone 50 MCG/ACT nasal spray Commonly known as: FLONASE Place 1 spray into both nostrils 2 (two) times daily.   gabapentin 400 MG capsule Commonly known as: NEURONTIN Take 400 mg by mouth 3 (three) times daily.   haloperidol 10 MG tablet Commonly known as: HALDOL Take 10 mg by mouth 3 (three) times daily.   hydrOXYzine 50 MG capsule Commonly  known as: VISTARIL Take 50 mg by mouth 2 (two) times daily.   Ibuprofen PM 200-25 MG Caps Generic drug: Ibuprofen-diphenhydrAMINE HCl Take 2 tablets by mouth at bedtime as needed (pain / sleep).   meloxicam 15 MG tablet Commonly known as: MOBIC Take 1 tablet (15 mg total) by mouth daily.   metoprolol succinate 50 MG 24 hr tablet Commonly known as: TOPROL-XL Take 50 mg by mouth daily.   montelukast 10 MG tablet Commonly known as: SINGULAIR TAKE 1 TABLET (10 MG TOTAL) BY MOUTH AT BEDTIME. What changed: See the new instructions.   olanzapine zydis 15 MG disintegrating tablet Commonly known as: ZYPREXA Take 15 mg by mouth at bedtime.   omeprazole 20 MG capsule Commonly known as: PRILOSEC Take 20 mg by  mouth daily.   prazosin 5 MG capsule Commonly known as: MINIPRESS Take 5 mg by mouth at bedtime.   promethazine 25 MG tablet Commonly known as: PHENERGAN Take 25 mg by mouth every 6 (six) hours as needed for nausea or vomiting.   rOPINIRole 2 MG tablet Commonly known as: REQUIP Take 2 mg by mouth 2 (two) times daily.   Synthroid 75 MCG tablet Generic drug: levothyroxine Take 75 mcg by mouth daily.   tiZANidine 4 MG tablet Commonly known as: ZANAFLEX Take 4 mg by mouth 3 (three) times daily.   topiramate 25 MG tablet Commonly known as: TOPAMAX Take 50 mg by mouth daily.   Ventolin HFA 108 (90 Base) MCG/ACT inhaler Generic drug: albuterol Inhale 2 puffs into the lungs every 4 (four) hours as needed for wheezing or shortness of breath. What changed: when to take this   Zenpep 40000-126000 units Cpep Generic drug: Pancrelipase (Lip-Prot-Amyl) Take 1-2 capsules by mouth See admin instructions. TAKE 2 CAPSULES BY MOUTH WITH A MEAL AND 1 CAPSULE WITH A SNACK      Verbal and written Discharge instructions given to the patient. Wound care per Discharge AVS  Follow-up Information    Schnier, [name], MD. Go on 03/05/2020.   Specialties: Vascular Surgery, Cardiology,  Radiology, Vascular Surgery Why: Appointment at 9:30am Contact information: [address] [place] [address] [phone]              Signed: Tonette Lederer, PA-C  02/02/2020, 10:10 AM

## 2020-02-02 NOTE — Progress Notes (Signed)
Pt ambulated to bathroom and back to bed w/o difficulty.  All air has been removed from PAD and site clean, dry, soft, intact w/o swell/bleed.  Pedal pulses palpable.

## 2020-02-03 ENCOUNTER — Telehealth (INDEPENDENT_AMBULATORY_CARE_PROVIDER_SITE_OTHER): Payer: Self-pay

## 2020-02-03 NOTE — Telephone Encounter (Signed)
Patient left a voicemail stating that she is having pain in her groin area since her recent procedure and is asking for pain medication to help with relief. I spoke with Sheppard Plumber NP and she advise that the patient can alternate Tramadol and tylenol 1000mg  for pain. I have called in Rx for Tramdol 50 mg #15 into pharmacy and patient has being made aware

## 2020-02-06 ENCOUNTER — Ambulatory Visit: Payer: 59 | Admitting: Urology

## 2020-02-07 NOTE — Interval H&P Note (Signed)
History and Physical Interval Note:  02/07/2020 3:49 PM  Sabrina Espinoza  has presented today for surgery, with the diagnosis of RT Carotid Stent Placement  ABBOTT  Carotid artery stenosisDr Tyia Binford w Dr Wyn Quaker to assist Covid  July 26.  The various methods of treatment have been discussed with the patient and family. After consideration of risks, benefits and other options for treatment, the patient has consented to  Procedure(s): CAROTID PTA/STENT INTERVENTION (Right) as a surgical intervention.  The patient's history has been reviewed, patient examined, no change in status, stable for surgery.  I have reviewed the patient's chart and labs.  Questions were answered to the patient's satisfaction.     [name]

## 2020-03-01 ENCOUNTER — Other Ambulatory Visit: Payer: Self-pay | Admitting: Student

## 2020-03-01 DIAGNOSIS — T8143XA Infection following a procedure, organ and space surgical site, initial encounter: Secondary | ICD-10-CM

## 2020-03-01 DIAGNOSIS — R1084 Generalized abdominal pain: Secondary | ICD-10-CM

## 2020-03-01 DIAGNOSIS — Z9049 Acquired absence of other specified parts of digestive tract: Secondary | ICD-10-CM

## 2020-03-05 ENCOUNTER — Ambulatory Visit (INDEPENDENT_AMBULATORY_CARE_PROVIDER_SITE_OTHER): Payer: 59

## 2020-03-05 ENCOUNTER — Other Ambulatory Visit: Payer: Self-pay

## 2020-03-05 ENCOUNTER — Other Ambulatory Visit (INDEPENDENT_AMBULATORY_CARE_PROVIDER_SITE_OTHER): Payer: Self-pay | Admitting: Vascular Surgery

## 2020-03-05 ENCOUNTER — Ambulatory Visit (INDEPENDENT_AMBULATORY_CARE_PROVIDER_SITE_OTHER): Payer: 59 | Admitting: Nurse Practitioner

## 2020-03-05 ENCOUNTER — Encounter (INDEPENDENT_AMBULATORY_CARE_PROVIDER_SITE_OTHER): Payer: Self-pay | Admitting: Nurse Practitioner

## 2020-03-05 VITALS — BP 121/82 | HR 106 | Resp 16 | Wt 186.0 lb

## 2020-03-05 DIAGNOSIS — F17200 Nicotine dependence, unspecified, uncomplicated: Secondary | ICD-10-CM

## 2020-03-05 DIAGNOSIS — I6521 Occlusion and stenosis of right carotid artery: Secondary | ICD-10-CM

## 2020-03-05 DIAGNOSIS — Z95820 Peripheral vascular angioplasty status with implants and grafts: Secondary | ICD-10-CM

## 2020-03-05 DIAGNOSIS — I6523 Occlusion and stenosis of bilateral carotid arteries: Secondary | ICD-10-CM

## 2020-03-05 DIAGNOSIS — R5383 Other fatigue: Secondary | ICD-10-CM

## 2020-03-05 NOTE — Progress Notes (Signed)
Subjective:    Patient ID: Sabrina Espinoza, female    DOB: [birth year], 54 y.o.   MRN: [id number] Chief Complaint  Patient presents with  . Follow-up    [facility] 23month post carotid     The patient is seen for follow up evaluation of carotid stenosis status post right internal carotid artery stent 02/01/2020.  There were no post operative problems or complications related to the surgery.  The patient denies neck or incisional pain.  However, the patient does note some exceptional fatigue since her surgery.  The patient notes that she has not had any energy to get up and do anything.  The patient also notes that she only eats approximately 1 meal a day.  The patient denies interval amaurosis fugax. There is no recent history of TIA symptoms or focal motor deficits. There is no prior documented CVA.  The patient denies headache.  The patient is taking enteric-coated aspirin 81 mg daily.  The patient has a history of coronary artery disease, no recent episodes of angina or shortness of breath. The patient denies PAD or claudication symptoms. There is a history of hyperlipidemia which is being treated with a statin.   Today the patient's right ICA has a 1 to 39% stenosis the patient's stent is patent.  The left ICA has no evidence of stenosis.  The bilateral vertebral arteries demonstrate antegrade flow.  There is normal flow hemodynamics in the bilateral subclavian arteries.   Review of Systems  Constitutional: Positive for fatigue.       Objective:   Physical Exam Vitals reviewed.  HENT:     Head: Normocephalic.  Neck:     Vascular: No carotid bruit.  Cardiovascular:     Rate and Rhythm: Normal rate and regular rhythm.     Pulses: Normal pulses.  Pulmonary:     Effort: Pulmonary effort is normal.  Skin:    Capillary Refill: Capillary refill takes less than 2 seconds.  Neurological:     Mental Status: She is alert and oriented to person, place, and time.  Psychiatric:         Mood and Affect: Mood normal.        Behavior: Behavior normal.        Thought Content: Thought content normal.        Judgment: Judgment normal.     BP 121/82 (BP Location: Left Arm)   Pulse (!) 106   Resp 16   Wt 186 lb (84.4 kg)   LMP  (LMP Unknown) Comment: no period in over a year per patient  BMI 27.47 kg/m   Past Medical History:  Diagnosis Date  . Anxiety   . Arthritis    neck  . Asthma   . Bipolar disorder (HCC)   . Chronic constipation    on Linzess, Miralax, Zofran  . Chronic insomnia 11/02/2015  . COPD, moderate (HCC)   . Depression   . Depression with anxiety    Followed by Dr. Lynett Fish, psychiatrist at [facility] and Wilma Flavin for thrapy  . GERD (gastroesophageal reflux disease)   . Hepatitis    Hep C, history, has been treated and cured per pt.  . History of chicken pox   . History of cocaine use   . History of hiatal hernia   . History of pneumonia   . Hypertension   . Hypothyroidism, adult    Working with Dr. Horton Chin, Endocrinology. S/P RAIU and is planning on biopsy  .  Migraine without status migrainosus, not intractable   . Nicotine dependence, uncomplicated   . Nontoxic multinodular goiter    FNA done 10/30/14 with benign results. Seen [name]. Patient may want a second opinion.  . Rhinitis, allergic   . Vitamin D deficiency     Social History   Socioeconomic History  . Marital status: Single    Spouse name: Not on file  . Number of children: Not on file  . Years of education: Not on file  . Highest education level: Not on file  Occupational History  . Not on file  Tobacco Use  . Smoking status: Current Every Day Smoker    Packs/day: 1.00    Years: 32.00    Pack years: 32.00    Types: Cigarettes  . Smokeless tobacco: Never Used  Vaping Use  . Vaping Use: Never used  Substance and Sexual Activity  . Alcohol use: No    Alcohol/week: 0.0 standard drinks  . Drug use: No    Comment: history of cocaine  use, clean for 3.5 years   . Sexual activity: Never  Other Topics Concern  . Not on file  Social History Narrative  . Not on file   Social Determinants of Health   Financial Resource Strain:   . Difficulty of Paying Living Expenses: Not on file  Food Insecurity:   . Worried About Programme researcher, broadcasting/film/video in the Last Year: Not on file  . Ran Out of Food in the Last Year: Not on file  Transportation Needs:   . Lack of Transportation (Medical): Not on file  . Lack of Transportation (Non-Medical): Not on file  Physical Activity:   . Days of Exercise per Week: Not on file  . Minutes of Exercise per Session: Not on file  Stress:   . Feeling of Stress : Not on file  Social Connections:   . Frequency of Communication with Friends and Family: Not on file  . Frequency of Social Gatherings with Friends and Family: Not on file  . Attends Religious Services: Not on file  . Active Member of Clubs or Organizations: Not on file  . Attends Banker Meetings: Not on file  . Marital Status: Not on file  Intimate Partner Violence:   . Fear of Current or Ex-Partner: Not on file  . Emotionally Abused: Not on file  . Physically Abused: Not on file  . Sexually Abused: Not on file    Past Surgical History:  Procedure Laterality Date  . ANKLE FRACTURE SURGERY  1988   rods and pins in place  . APPENDECTOMY  04/17/2016   Procedure: APPENDECTOMY;  Surgeon: Leafy Ro, MD;  Location: [facility] ORS;  Service: General;;  . CAROTID PTA/STENT INTERVENTION Right 02/01/2020   Procedure: CAROTID PTA/STENT INTERVENTION;  Surgeon: Renford Dills, MD;  Location: [facility] INV[facility]IVE [facility];  Service: Cardiovascular;  Laterality: Right;  . CARPAL TUNNEL RELEASE Right 11/24/2016   Procedure: CARPAL TUNNEL RELEASE;  Surgeon: Deeann Saint, MD;  Location: [facility] ORS;  Service: Orthopedics;  Laterality: Right;  . CESAREAN SECTION  1996  . COLECTOMY WITH COLOSTOMY CREATION/HARTMANN PROCEDURE N/A 04/17/2016    Procedure: COLOSTOMY CREATION/HARTMANN PROCEDURE;  Surgeon: Leafy Ro, MD;  Location: [facility] ORS;  Service: General;  Laterality: N/A;  . ECTOPIC PREGNANCY SURGERY  2003  . LAPAROTOMY N/A 04/17/2016   Procedure: EXPLORATORY LAPAROTOMY;  Surgeon: Leafy Ro, MD;  Location: [facility] ORS;  Service: General;  Laterality: N/A;  . NECK SURGERY  Family History  Problem Relation Age of Onset  . Heart disease Father   . Alcohol abuse Father   . Diabetes Father   . Heart disease Brother   . Depression Brother   . Stroke Brother   . Alcohol abuse Paternal Grandfather   . Osteoporosis Mother   . Breast cancer Neg Hx     Allergies  Allergen Reactions  . Sulfa Antibiotics     Other reaction(s): Other (See Comments) "feel like Im losing my mind" Other reaction(s): Other (See Comments) Patient gets "Agitated, hyped up" with using this drug       Assessment & Plan:   1. Other fatigue The patient complains of severe fatigue following her procedure.  She has recently had a follow-up visit with her PCP but notes that there was no lab work done at that time.  The patient also admits to only eating 1 meal a day.  We will have the patient get blood work to see if there is some anemia or infection or issues with kidney function.  In the absence of any abnormalities, will refer back to PCP for work-up. - CBC with Differential; Future - Basic Metabolic Panel (BMET); Future  2. Tobacco use disorder Smoking cessation was discussed, 3-10 minutes spent on this topic specifically   3. Bilateral carotid artery stenosis Recommend:  The patient is s/p successful right carotid stenting  Duplex ultrasound preoperatively shows no contralateral stenosis.  Continue antiplatelet therapy as prescribed Continue management of CAD, HTN and Hyperlipidemia Healthy heart diet,  encouraged exercise at least 4 times per week  Follow up in 3 months with duplex ultrasound and physical exam based on the  patient's carotid stenting of the right internal carotid artery    Current Outpatient Medications on File Prior to Visit  Medication Sig Dispense Refill  . acetaminophen (TYLENOL) 500 MG tablet Take 1,000 mg by mouth every 6 (six) hours as needed for moderate pain or headache.    [name] aspirin EC 81 MG EC tablet Take 1 tablet (81 mg total) by mouth daily. Swallow whole. 90 tablet 3  . Aspirin-Salicylamide-Caffeine (BC HEADACHE PO) Take 1 packet by mouth 3 (three) times daily as needed (headache).    [name] azelastine (ASTELIN) 0.1 % nasal spray Place 1 spray into the nose 2 (two) times daily. Use in each nostril as directed     . budesonide-formoterol (SYMBICORT) 160-4.5 MCG/ACT inhaler Inhale 2 puffs into the lungs 2 (two) times daily. USE 2 PUFFS 2 TIMES A DAY (Patient taking differently: Inhale 2 puffs into the lungs 2 (two) times daily. ) 10.2 Inhaler 2  . cetirizine (ZYRTEC) 10 MG tablet Take 10 mg by mouth daily.    . clonazePAM (KLONOPIN) 1 MG tablet Take 1 mg by mouth 3 (three) times daily.     . clopidogrel (PLAVIX) 75 MG tablet Take 1 tablet (75 mg total) by mouth daily at 6 (six) AM. 90 tablet 3  . docusate sodium (COLACE) 100 MG capsule Take 200 mg by mouth daily.    . fluticasone (FLONASE) 50 MCG/ACT nasal spray Place 1 spray into both nostrils 2 (two) times daily. 16 g 11  . gabapentin (NEURONTIN) 400 MG capsule Take 400 mg by mouth 3 (three) times daily.     . haloperidol (HALDOL) 10 MG tablet Take 10 mg by mouth 3 (three) times daily.    . hydrOXYzine (VISTARIL) 50 MG capsule Take 50 mg by mouth 2 (two) times daily.    . Ibuprofen-diphenhydrAMINE  HCl (IBUPROFEN PM) 200-25 MG CAPS Take 2 tablets by mouth at bedtime as needed (pain / sleep).    . meloxicam (MOBIC) 15 MG tablet Take 1 tablet (15 mg total) by mouth daily. 30 tablet 3  . metoprolol succinate (TOPROL-XL) 50 MG 24 hr tablet Take 50 mg by mouth daily.    . montelukast (SINGULAIR) 10 MG tablet TAKE 1 TABLET (10 MG TOTAL) BY MOUTH  AT BEDTIME. (Patient taking differently: Take 10 mg by mouth at bedtime. ) 30 tablet 6  . omeprazole (PRILOSEC) 20 MG capsule Take 20 mg by mouth daily.    . prazosin (MINIPRESS) 5 MG capsule Take 5 mg by mouth at bedtime.    . promethazine (PHENERGAN) 25 MG tablet Take 25 mg by mouth every 6 (six) hours as needed for nausea or vomiting.    [name]. rOPINIRole (REQUIP) 2 MG tablet Take 2 mg by mouth 2 (two) times daily.    [name]. SYNTHROID 75 MCG tablet Take 75 mcg by mouth daily.    [name]. tiZANidine (ZANAFLEX) 4 MG tablet Take 4 mg by mouth 3 (three) times daily.     [name]. topiramate (TOPAMAX) 25 MG tablet Take 50 mg by mouth daily.    . VENTOLIN HFA 108 (90 Base) MCG/ACT inhaler Inhale 2 puffs into the lungs every 4 (four) hours as needed for wheezing or shortness of breath. (Patient taking differently: Inhale 2 puffs into the lungs every 6 (six) hours as needed for wheezing or shortness of breath. ) 1 Inhaler 1  . ZENPEP 40000-126000 units CPEP Take 1-2 capsules by mouth See admin instructions. TAKE 2 CAPSULES BY MOUTH WITH A MEAL AND 1 CAPSULE WITH A SNACK    . olanzapine zydis (ZYPREXA) 15 MG disintegrating tablet Take 15 mg by mouth at bedtime.  (Patient not taking: Reported on 02/01/2020)     No current facility-administered medications on file prior to visit.    There are no Patient Instructions on file for this visit. No follow-ups on file.   Georgiana SpinnerFallon E Cormac Wint, NP

## 2020-04-02 NOTE — Progress Notes (Signed)
04/03/2020 1:08 PM   Sabrina Espinoza 1966/04/28 [id number]  Referring provider: Armando Gang, FNP [address] [place],  [place] [address]  Chief Complaint  Patient presents with  . Recurrent UTI    HPI: Sabrina Espinoza is a 54 year old female with rUTI's and a high risk hematuria who presents today for UTI symptoms.  When I questioned patient regarding her UTI symptoms, she stated that she does not have symptoms when she has a UTI.  She states that people just tell her urine is infected.  The reason she made this appointment today is because she missed several appointments prior.  Her complaints today are urinary frequency, urgency and nighttime incontinence.  She states the frequent urination and the urgent urination has been present all her life.  She states the nighttime incontinence started approximately 2 months ago.  Patient denies any modifying or aggravating factors.  Patient denies any gross hematuria, dysuria or suprapubic/flank pain.  Patient denies any fevers, chills, nausea or vomiting.   High risk hematuria Smoker.  Contrast CT 12/2018 Normal adrenal glands. No hydronephrosis or perinephric edema. Homogeneous renal enhancement with symmetric excretion on delayed phase imaging. Punctate nonobstructing stone in the left kidney. Urinary bladder is physiologically distended without wall thickening.  Cystoscopy with Dr. Sherron Monday 07/2019 noted diffuse erythema with mild cystitis cystica. No evidence of carcinoma.  She denied any gross hematuria.  Her UA today with 3-10 RBC's.   KUB (08/11/2019) mild retained fecal material.  No stones.   She has a CT scan schedule for the 30th for further evaluation of generalized abdominal pain.     rUTI's Risk factors: age, female gender, constipation, incomplete bladder emptying, post menopausal and limited fluid intake.  She states that ever since she has had her abdominal surgery in 2017, she has been having a constant UTI.  She is  not sexually active.  She is postmenopausal.  She admits to constipation.  She does take tub baths.  She has incontinence.  She states she wears pads all the time.  She is only drinking sweet tea and Sprite.  She has been followed by Dr. Sherron Monday who had placed her on trimethoprim suppression therapy.  She had a positive Enterococcus faecalis UTI in January.  She was started on Cipro and then when the culture returned was switched to nitrofurantoin.  She was to resume her trimethoprim suppression, but she has misplaced the bottle. When questioned regarding her urinary tract infection symptoms, she stated she had none and that providers just tell her she has an UTI.    PMH: Past Medical History:  Diagnosis Date  . Anxiety   . Arthritis    neck  . Asthma   . Bipolar disorder (HCC)   . Chronic constipation    on Linzess, Miralax, Zofran  . Chronic insomnia 11/02/2015  . COPD, moderate (HCC)   . Depression   . Depression with anxiety    Followed by Dr. Lynett Fish, psychiatrist at [facility] and [facility] Flavin for thrapy  . GERD (gastroesophageal reflux disease)   . Hepatitis    Hep C, history, has been treated and cured per pt.  . History of chicken pox   . History of cocaine use   . History of hiatal hernia   . History of pneumonia   . Hypertension   . Hypothyroidism, adult    Working with Dr. Horton Chin, Endocrinology. S/P RAIU and is planning on biopsy  . Migraine without status  migrainosus, not intractable   . Nicotine dependence, uncomplicated   . Nontoxic multinodular goiter    FNA done 10/30/14 with benign results. Seen [name]. Patient may want a second opinion.  . Rhinitis, allergic   . Vitamin D deficiency     Surgical History: Past Surgical History:  Procedure Laterality Date  . ANKLE FRACTURE SURGERY  1988   rods and pins in place  . APPENDECTOMY  04/17/2016   Procedure: APPENDECTOMY;  Surgeon: Leafy Ro, MD;  Location: [facility] ORS;  Service:  General;;  . CAROTID PTA/STENT INTERVENTION Right 02/01/2020   Procedure: CAROTID PTA/STENT INTERVENTION;  Surgeon: Renford Dills, MD;  Location: [facility] INVASIVE CV LAB;  Service: Cardiovascular;  Laterality: Right;  . CARPAL TUNNEL RELEASE Right 11/24/2016   Procedure: CARPAL TUNNEL RELEASE;  Surgeon: Deeann Saint, MD;  Location: [facility] ORS;  Service: Orthopedics;  Laterality: Right;  . CESAREAN SECTION  1996  . COLECTOMY WITH COLOSTOMY CREATION/HARTMANN PROCEDURE N/A 04/17/2016   Procedure: COLOSTOMY CREATION/HARTMANN PROCEDURE;  Surgeon: Leafy Ro, MD;  Location: [facility] ORS;  Service: General;  Laterality: N/A;  . ECTOPIC PREGNANCY SURGERY  2003  . LAPAROTOMY N/A 04/17/2016   Procedure: EXPLORATORY LAPAROTOMY;  Surgeon: Leafy Ro, MD;  Location: [facility] ORS;  Service: General;  Laterality: N/A;  . NECK SURGERY      Home Medications:  Allergies as of 04/03/2020      Reactions   Sulfa Antibiotics    Other reaction(s): Other (See Comments) "feel like Im losing my mind" Other reaction(s): Other (See Comments) Patient gets "Agitated, hyped up" with using this drug      Medication List       Accurate as of April 03, 2020 11:59 PM. If you have any questions, ask your nurse or doctor.        acetaminophen 500 MG tablet Commonly known as: TYLENOL Take 1,000 mg by mouth every 6 (six) hours as needed for moderate pain or headache.   aspirin 81 MG EC tablet Take 1 tablet (81 mg total) by mouth daily. Swallow whole.   azelastine 0.1 % nasal spray Commonly known as: ASTELIN Place 1 spray into the nose 2 (two) times daily. Use in each nostril as directed   BC HEADACHE PO Take 1 packet by mouth 3 (three) times daily as needed (headache).   budesonide-formoterol 160-4.5 MCG/ACT inhaler Commonly known as: Symbicort Inhale 2 puffs into the lungs 2 (two) times daily. USE 2 PUFFS 2 TIMES A DAY What changed: additional instructions   butalbital-acetaminophen-caffeine 50-325-40  MG tablet Commonly known as: FIORICET Take 1 tablet by mouth every 4 (four) hours as needed.   cetirizine 10 MG tablet Commonly known as: ZYRTEC Take 10 mg by mouth daily.   clonazePAM 1 MG tablet Commonly known as: KLONOPIN Take 1 mg by mouth 3 (three) times daily.   clopidogrel 75 MG tablet Commonly known as: PLAVIX Take 1 tablet (75 mg total) by mouth daily at 6 (six) AM.   docusate sodium 100 MG capsule Commonly known as: COLACE Take 200 mg by mouth daily.   fluticasone 50 MCG/ACT nasal spray Commonly known as: FLONASE Place 1 spray into both nostrils 2 (two) times daily.   gabapentin 400 MG capsule Commonly known as: NEURONTIN Take 400 mg by mouth 3 (three) times daily.   haloperidol 10 MG tablet Commonly known as: HALDOL Take 10 mg by mouth 3 (three) times daily.   hydrOXYzine 50 MG capsule Commonly known as: VISTARIL Take 50 mg by  mouth 2 (two) times daily.   hydrOXYzine 50 MG tablet Commonly known as: ATARAX/VISTARIL Take 50 mg by mouth 2 (two) times daily as needed.   Ibuprofen PM 200-25 MG Caps Generic drug: Ibuprofen-diphenhydrAMINE HCl Take 2 tablets by mouth at bedtime as needed (pain / sleep).   levocetirizine 5 MG tablet Commonly known as: XYZAL Take 5 mg by mouth daily.   linezolid 600 MG tablet Commonly known as: ZYVOX SMARTSIG:1 Tablet(s) By Mouth Every 12 Hours   Linzess 290 MCG Caps capsule Generic drug: linaclotide Take 290 mcg by mouth daily.   meloxicam 15 MG tablet Commonly known as: MOBIC Take 1 tablet (15 mg total) by mouth daily.   metoprolol succinate 50 MG 24 hr tablet Commonly known as: TOPROL-XL Take 50 mg by mouth daily.   montelukast 10 MG tablet Commonly known as: SINGULAIR TAKE 1 TABLET (10 MG TOTAL) BY MOUTH AT BEDTIME. What changed: See the new instructions.   mupirocin ointment 2 % Commonly known as: BACTROBAN SMARTSIG:Sparingly Topical 3 Times Daily   olanzapine zydis 15 MG disintegrating tablet Commonly  known as: ZYPREXA Take 15 mg by mouth at bedtime.   omeprazole 20 MG capsule Commonly known as: PRILOSEC Take 20 mg by mouth daily.   omeprazole 40 MG capsule Commonly known as: PRILOSEC Take 40 mg by mouth every morning.   prazosin 5 MG capsule Commonly known as: MINIPRESS Take 5 mg by mouth at bedtime.   promethazine 25 MG tablet Commonly known as: PHENERGAN Take 25 mg by mouth every 6 (six) hours as needed for nausea or vomiting.   rOPINIRole 2 MG tablet Commonly known as: REQUIP Take 2 mg by mouth 2 (two) times daily.   Suboxone 8-2 MG Film Generic drug: Buprenorphine HCl-Naloxone HCl Place under the tongue.   Synthroid 75 MCG tablet Generic drug: levothyroxine Take 75 mcg by mouth daily.   tiZANidine 4 MG tablet Commonly known as: ZANAFLEX Take 4 mg by mouth 3 (three) times daily.   topiramate 25 MG tablet Commonly known as: TOPAMAX Take 50 mg by mouth daily.   trimethoprim 100 MG tablet Commonly known as: TRIMPEX Take 100 mg by mouth daily.   Ventolin HFA 108 (90 Base) MCG/ACT inhaler Generic drug: albuterol Inhale 2 puffs into the lungs every 4 (four) hours as needed for wheezing or shortness of breath. What changed: when to take this   Zenpep 40000-126000 units Cpep Generic drug: Pancrelipase (Lip-Prot-Amyl) Take 1-2 capsules by mouth See admin instructions. TAKE 2 CAPSULES BY MOUTH WITH A MEAL AND 1 CAPSULE WITH A SNACK       Allergies:  Allergies  Allergen Reactions  . Sulfa Antibiotics     Other reaction(s): Other (See Comments) "feel like Im losing my mind" Other reaction(s): Other (See Comments) Patient gets "Agitated, hyped up" with using this drug    Family History: Family History  Problem Relation Age of Onset  . Heart disease Father   . Alcohol abuse Father   . Diabetes Father   . Heart disease Brother   . Depression Brother   . Stroke Brother   . Alcohol abuse Paternal Grandfather   . Osteoporosis Mother   . Breast cancer  Neg Hx     Social History:  reports that she has been smoking cigarettes. She has a 32.00 pack-year smoking history. She has never used smokeless tobacco. She reports that she does not drink alcohol and does not use drugs.  ROS:  Physical Exam: BP 109/75   Pulse [name])  108   Ht 5\' 9"  (1.753 m)   Wt 187 lb (84.8 kg)   LMP  (LMP Unknown) Comment: no period in over a year per patient  BMI 27.62 kg/m   Constitutional:  Well nourished. Alert and oriented, No acute distress. HEENT: [place] AT, mask in place.  Trachea midline Cardiovascular: No clubbing, cyanosis, or edema. Respiratory: Normal respiratory effort, no increased work of breathing. Neurologic: Grossly intact, no focal deficits, moving all 4 extremities. Psychiatric: Normal mood and affect.   Laboratory Data: Lab Results  Component Value Date   WBC 20.8 (H) 04/15/2020   HGB 14.3 04/15/2020   HCT 41.2 04/15/2020   MCV 84.1 04/15/2020   PLT 320 04/15/2020    Lab Results  Component Value Date   CREATININE 0.76 04/15/2020    Lab Results  Component Value Date   TSH 0.241 (L) 06/20/2016       Component Value Date/Time   CHOL 212 (H) 02/26/2015 0944   HDL 61 02/26/2015 0944   CHOLHDL 3.5 02/26/2015 0944   LDLCALC 118 (H) 02/26/2015 0944    Lab Results  Component Value Date   AST 20 04/15/2020   Lab Results  Component Value Date   ALT 22 04/15/2020    Urinalysis Component     Latest Ref Rng & Units 04/03/2020          Specific Gravity, UA     1.005 - 1.030 1.025  pH, UA     5.0 - 7.5 6.0  Color, UA     Yellow Yellow  Appearance Ur     Clear Cloudy (A)  Leukocytes,UA     Negative 2+ (A)  Protein,UA     Negative/Trace Negative  Glucose, UA     Negative Negative  Ketones, UA     Negative Negative  RBC, UA     Negative Trace (A)  Bilirubin, UA     Negative Negative  Urobilinogen, Ur     0.2 - 1.0 mg/dL 0.2  Nitrite, UA     Negative Negative  Microscopic Examination      See below:    Component     Latest Ref Rng & Units 04/03/2020          WBC, UA     0 - 5 /hpf >30 (A)  RBC     0 - 2 /hpf 3-10 (A)  Epithelial Cells (non renal)     0 - 10 /hpf 0-10  Casts     None seen /lpf Present (A)  Cast Type     N/A Hyaline casts  Crystals     N/A Present (A)  Crystal Type     N/A Calcium Oxalate  Bacteria, UA     None seen/Few Many (A)   I have reviewed the labs.   Pertinent Imaging: Results for Sabrina RasUCKER, Siniyah Espinoza (MRN [id number]) as of 04/03/2020 14:50  Ref. Range 04/03/2020 11:40  Scan Result Unknown 19     Assessment & Plan:   1. rUTI's -Patient asymptomatic at this visit -Patient appears to be a poor historian, but I suspect that she has asymptomatic bacteriuria versus recurrent UTIs as she states "she does not know that she has infection that they just tell her she has infection"  2. Microscopic hematuria -Patient with a history of several contrast CT's with no findings to explain micro heme -Cysto 07/2019 diffuse erythema with mild cystitis cystica. -UA today with 3-10 RBC's -Another contrast CT is scheduled for this Thursday -Urine  is sent for culture in preparation for possible urological intervention if needed, otherwise we will not treat with antibiotics unless she becomes symptomatic   Return in about 4 months (around 08/03/2020) for UA and symptom recheck .  These notes generated with voice recognition software. I apologize for typographical errors.  Michiel Cowboy, PA-C  [facility] [address]  [address] [place], [place] [address] [phone]

## 2020-04-03 ENCOUNTER — Encounter: Payer: Self-pay | Admitting: Urology

## 2020-04-03 ENCOUNTER — Ambulatory Visit (INDEPENDENT_AMBULATORY_CARE_PROVIDER_SITE_OTHER): Payer: 59 | Admitting: Urology

## 2020-04-03 ENCOUNTER — Other Ambulatory Visit: Payer: Self-pay

## 2020-04-03 VITALS — BP 109/75 | HR 108 | Ht 69.0 in | Wt 187.0 lb

## 2020-04-03 DIAGNOSIS — N390 Urinary tract infection, site not specified: Secondary | ICD-10-CM

## 2020-04-03 DIAGNOSIS — N952 Postmenopausal atrophic vaginitis: Secondary | ICD-10-CM | POA: Diagnosis not present

## 2020-04-03 DIAGNOSIS — R3129 Other microscopic hematuria: Secondary | ICD-10-CM | POA: Diagnosis not present

## 2020-04-03 LAB — BLADDER SCAN AMB NON-IMAGING: Scan Result: 19

## 2020-04-04 LAB — URINALYSIS, COMPLETE
Bilirubin, UA: NEGATIVE
Glucose, UA: NEGATIVE
Ketones, UA: NEGATIVE
Nitrite, UA: NEGATIVE
Protein,UA: NEGATIVE
Specific Gravity, UA: 1.025 (ref 1.005–1.030)
Urobilinogen, Ur: 0.2 mg/dL (ref 0.2–1.0)
pH, UA: 6 (ref 5.0–7.5)

## 2020-04-04 LAB — MICROSCOPIC EXAMINATION: WBC, UA: >30 /hpf — AB (ref 0–5)

## 2020-04-05 ENCOUNTER — Ambulatory Visit
Admission: RE | Admit: 2020-04-05 | Discharge: 2020-04-05 | Disposition: A | Discharge status: Home / Self Care | Payer: 59 | Source: Ambulatory Visit | Attending: Student | Admitting: Student

## 2020-04-05 ENCOUNTER — Other Ambulatory Visit: Payer: Self-pay

## 2020-04-05 DIAGNOSIS — Z9049 Acquired absence of other specified parts of digestive tract: Secondary | ICD-10-CM

## 2020-04-05 DIAGNOSIS — T8143XA Infection following a procedure, organ and space surgical site, initial encounter: Secondary | ICD-10-CM | POA: Insufficient documentation

## 2020-04-05 DIAGNOSIS — R1084 Generalized abdominal pain: Secondary | ICD-10-CM | POA: Insufficient documentation

## 2020-04-05 LAB — POCT I-STAT CREATININE: Creatinine, Ser: 0.7 mg/dL (ref 0.44–1.00)

## 2020-04-05 MED ORDER — IOHEXOL 300 MG/ML  SOLN
100.0000 mL | Freq: Once | INTRAMUSCULAR | Status: AC | PRN
  Administered 2020-04-05: 100 mL via INTRAVENOUS

## 2020-04-08 LAB — CULTURE, URINE COMPREHENSIVE

## 2020-04-15 ENCOUNTER — Other Ambulatory Visit: Payer: Self-pay

## 2020-04-15 ENCOUNTER — Emergency Department
Admission: EM | Admit: 2020-04-15 | Discharge: 2020-04-15 | Disposition: A | Discharge status: Home / Self Care | Payer: 59 | Attending: Emergency Medicine | Admitting: Emergency Medicine

## 2020-04-15 DIAGNOSIS — R112 Nausea with vomiting, unspecified: Secondary | ICD-10-CM | POA: Insufficient documentation

## 2020-04-15 DIAGNOSIS — R1030 Lower abdominal pain, unspecified: Secondary | ICD-10-CM | POA: Diagnosis not present

## 2020-04-15 DIAGNOSIS — R197 Diarrhea, unspecified: Secondary | ICD-10-CM | POA: Diagnosis not present

## 2020-04-15 DIAGNOSIS — Z5321 Procedure and treatment not carried out due to patient leaving prior to being seen by health care provider: Secondary | ICD-10-CM | POA: Diagnosis not present

## 2020-04-15 DIAGNOSIS — Z7902 Long term (current) use of antithrombotics/antiplatelets: Secondary | ICD-10-CM | POA: Diagnosis not present

## 2020-04-15 DIAGNOSIS — K921 Melena: Secondary | ICD-10-CM | POA: Insufficient documentation

## 2020-04-15 LAB — COMPREHENSIVE METABOLIC PANEL
ALT: 22 U/L (ref 0–44)
AST: 20 U/L (ref 15–41)
Albumin: 4.1 g/dL (ref 3.5–5.0)
Alkaline Phosphatase: 187 U/L — ABNORMAL HIGH (ref 38–126)
Anion gap: 12 (ref 5–15)
BUN: 6 mg/dL (ref 6–20)
CO2: 22 mmol/L (ref 22–32)
Calcium: 10.6 mg/dL — ABNORMAL HIGH (ref 8.9–10.3)
Chloride: 99 mmol/L (ref 98–111)
Creatinine, Ser: 0.76 mg/dL (ref 0.44–1.00)
GFR, Estimated: >60 mL/min (ref >60)
Glucose, Bld: 144 mg/dL — ABNORMAL HIGH (ref 70–99)
Potassium: 3.6 mmol/L (ref 3.5–5.1)
Sodium: 133 mmol/L — ABNORMAL LOW (ref 135–145)
Total Bilirubin: 0.6 mg/dL (ref 0.3–1.2)
Total Protein: 8 g/dL (ref 6.5–8.1)

## 2020-04-15 LAB — CBC
HCT: 41.2 % (ref 36.0–46.0)
Hemoglobin: 14.3 g/dL (ref 12.0–15.0)
MCH: 29.2 pg (ref 26.0–34.0)
MCHC: 34.7 g/dL (ref 30.0–36.0)
MCV: 84.1 fL (ref 80.0–100.0)
Platelets: 320 10*3/uL (ref 150–400)
RBC: 4.9 MIL/uL (ref 3.87–5.11)
RDW: 14.6 % (ref 11.5–15.5)
WBC: 20.8 10*3/uL — ABNORMAL HIGH (ref 4.0–10.5)
nRBC: 0 % (ref 0.0–0.2)

## 2020-04-15 LAB — LIPASE, BLOOD: Lipase: 58 U/L — ABNORMAL HIGH (ref 11–51)

## 2020-04-15 NOTE — ED Triage Notes (Addendum)
Pt arrives to ED vie ems from home. Pt reports n/v/d, abd pain started yesterday. States she has been having bright red stool starting yesterday around noon. Pt states "theres no stool just bright red blood". Pt reports takes plavix. Pt states abd pain below umbilical region in lower abd. NAD noted at this time

## 2020-04-15 NOTE — ED Notes (Signed)
No answer when called for VS.  EC/RN

## 2020-04-15 NOTE — ED Notes (Signed)
No answer when called for vital signs. Not in bathroom.

## 2020-04-16 ENCOUNTER — Telehealth: Payer: Self-pay | Admitting: Family Medicine

## 2020-04-16 ENCOUNTER — Telehealth: Payer: Self-pay | Admitting: Emergency Medicine

## 2020-04-16 NOTE — Telephone Encounter (Signed)
Called patient due to lwot to inquire about condition and follow up plans. She has not contacted pcp yet.  I encouraged her to contact pcp now and have her labs reviewed and let them know what her symptoms are.  She says she will do that.

## 2020-04-16 NOTE — Telephone Encounter (Signed)
Patient notified and voiced understanding.

## 2020-04-16 NOTE — Telephone Encounter (Signed)
-----   Message from Harle Battiest, PA-C sent at 04/16/2020 10:20 AM EDT ----- Please let Sabrina Espinoza know that her CT scans have shown small stones in her left kidney and no other concerning urological findings.  Her urine culture did grow out a bacteria, but as she is not having symptoms, this is likely colonization.

## 2020-04-17 ENCOUNTER — Telehealth: Payer: Self-pay | Admitting: Urology

## 2020-04-17 NOTE — Telephone Encounter (Signed)
We will need to see her in January for a yearly follow up for micro heme.

## 2020-04-19 NOTE — Telephone Encounter (Signed)
Patient does not want to schedule at this time. Requests return call in January.

## 2020-04-30 ENCOUNTER — Other Ambulatory Visit: Payer: Self-pay | Admitting: Family Medicine

## 2020-04-30 DIAGNOSIS — Z1231 Encounter for screening mammogram for malignant neoplasm of breast: Secondary | ICD-10-CM

## 2020-05-25 ENCOUNTER — Other Ambulatory Visit (INDEPENDENT_AMBULATORY_CARE_PROVIDER_SITE_OTHER): Payer: Self-pay | Admitting: Nurse Practitioner

## 2020-05-25 DIAGNOSIS — I6523 Occlusion and stenosis of bilateral carotid arteries: Secondary | ICD-10-CM

## 2020-05-28 ENCOUNTER — Ambulatory Visit (INDEPENDENT_AMBULATORY_CARE_PROVIDER_SITE_OTHER): Payer: 59 | Admitting: Vascular Surgery

## 2020-05-28 ENCOUNTER — Encounter (INDEPENDENT_AMBULATORY_CARE_PROVIDER_SITE_OTHER): Payer: 59

## 2020-05-28 NOTE — Progress Notes (Deleted)
MRN : [id number]  Sabrina Espinoza is a 54 y.o. (1966/03/08) female who presents with chief complaint of No chief complaint on file. [name]  History of Present Illness:   The patient is seen for follow up evaluation of carotid stenosis status post placement of a 10 x 8 x 40 exact stent with the use of the NAV-6 embolic protection device in the right internal carotid artery on 02/01/2020.    There were no post operative problems or complications related to the intervention.  The patient denies neck pain.  The patient denies interval amaurosis fugax. There is no recent history of TIA symptoms or focal motor deficits. There is no prior documented CVA.  The patient denies headache.  The patient is taking enteric-coated aspirin 81 mg daily.  The patient has a history of coronary artery disease, no recent episodes of angina or shortness of breath. The patient denies PAD or claudication symptoms. There is a history of hyperlipidemia which is being treated with a statin.    No outpatient medications have been marked as taking for the 05/28/20 encounter (Appointment) with [name], [name], MD.    Past Medical History:  Diagnosis Date  . Anxiety   . Arthritis    neck  . Asthma   . Bipolar disorder (HCC)   . Chronic constipation    on Linzess, Miralax, Zofran  . Chronic insomnia 11/02/2015  . COPD, moderate (HCC)   . Depression   . Depression with anxiety    Followed by Dr. Lynett Fish, psychiatrist at [facility] and [facility] for thrapy  . GERD (gastroesophageal reflux disease)   . Hepatitis    Hep C, history, has been treated and cured per pt.  . History of chicken pox   . History of cocaine use   . History of hiatal hernia   . History of pneumonia   . Hypertension   . Hypothyroidism, adult    Working with Dr. Horton Chin, Endocrinology. S/P RAIU and is planning on biopsy  . Migraine without status migrainosus, not intractable   . Nicotine dependence,  uncomplicated   . Nontoxic multinodular goiter    FNA done 10/30/14 with benign results. Seen [name]. Patient may want a second opinion.  . Rhinitis, allergic   . Vitamin D deficiency     Past Surgical History:  Procedure Laterality Date  . ANKLE FRACTURE SURGERY  1988   rods and pins in place  . APPENDECTOMY  04/17/2016   Procedure: APPENDECTOMY;  Surgeon: Leafy Ro, MD;  Location: [facility] ORS;  Service: General;;  . CAROTID PTA/STENT INTERVENTION Right 02/01/2020   Procedure: CAROTID PTA/STENT INTERVENTION;  Surgeon: Renford Dills, MD;  Location: [facility] INVASIVE CV LAB;  Service: Cardiovascular;  Laterality: Right;  . CARPAL TUNNEL RELEASE Right 11/24/2016   Procedure: CARPAL TUNNEL RELEASE;  Surgeon: Deeann Saint, MD;  Location: [facility] ORS;  Service: Orthopedics;  Laterality: Right;  . CESAREAN SECTION  1996  . COLECTOMY WITH COLOSTOMY CREATION/HARTMANN PROCEDURE N/A 04/17/2016   Procedure: COLOSTOMY CREATION/HARTMANN PROCEDURE;  Surgeon: Leafy Ro, MD;  Location: [facility] ORS;  Service: General;  Laterality: N/A;  . ECTOPIC PREGNANCY SURGERY  2003  . LAPAROTOMY N/A 04/17/2016   Procedure: EXPLORATORY LAPAROTOMY;  Surgeon: Leafy Ro, MD;  Location: [facility] ORS;  Service: General;  Laterality: N/A;  . NECK SURGERY      Social History Social History   Tobacco Use  . Smoking status: Current Every Day Smoker  Packs/day: 1.00    Years: 32.00    Pack years: 32.00    Types: Cigarettes  . Smokeless tobacco: Never Used  Vaping Use  . Vaping Use: Never used  Substance Use Topics  . Alcohol use: No    Alcohol/week: 0.0 standard drinks  . Drug use: No    Comment: history of cocaine use, clean for 3.5 years     Family History Family History  Problem Relation Age of Onset  . Heart disease Father   . Alcohol abuse Father   . Diabetes Father   . Heart disease Brother   . Depression Brother   . Stroke Brother   . Alcohol abuse Paternal Grandfather   . Osteoporosis  Mother   . Breast cancer Neg Hx     Allergies  Allergen Reactions  . Sulfa Antibiotics     Other reaction(s): Other (See Comments) "feel like Im losing my mind" Other reaction(s): Other (See Comments) Patient gets "Agitated, hyped up" with using this drug     REVIEW OF SYSTEMS (Negative unless checked)  Constitutional: [] Weight loss  [] Fever  [] Chills Cardiac: [] Chest pain   [] Chest pressure   [] Palpitations   [] Shortness of breath when laying flat   [] Shortness of breath with exertion. Vascular:  [] Pain in legs with walking   [] Pain in legs at rest  [] History of DVT   [] Phlebitis   [] Swelling in legs   [] Varicose veins   [] Non-healing ulcers Pulmonary:   [] Uses home oxygen   [] Productive cough   [] Hemoptysis   [] Wheeze  [] COPD   [] Asthma Neurologic:  [] Dizziness   [] Seizures   [x] History of stroke   [x] History of TIA  [] Aphasia   [] Vissual changes   [] Weakness or numbness in arm   [] Weakness or numbness in leg Musculoskeletal:   [] Joint swelling   [] Joint pain   [] Low back pain Hematologic:  [] Easy bruising  [] Easy bleeding   [] Hypercoagulable state   [] Anemic Gastrointestinal:  [] Diarrhea   [] Vomiting  [] Gastroesophageal reflux/heartburn   [] Difficulty swallowing. Genitourinary:  [] Chronic kidney disease   [] Difficult urination  [] Frequent urination   [] Blood in urine Skin:  [] Rashes   [] Ulcers  Psychological:  [] History of anxiety   []  History of major depression.  Physical Examination  There were no vitals filed for this visit. There is no height or weight on file to calculate BMI. Gen: WD/WN, NAD Head: [place]/AT, No temporalis wasting.  Ear/Nose/Throat: Hearing grossly intact, nares w/o erythema or drainage Eyes: PER, EOMI, sclera nonicteric.  Neck: Supple, no large masses.   Pulmonary:  Good air movement, no audible wheezing bilaterally, no use of accessory muscles.  Cardiac: RRR, no JVD Vascular: *** Vessel Right Left  Radial Palpable Palpable  Ulnar Palpable Palpable   Brachial Palpable Palpable  Carotid Palpable Palpable  Femoral Palpable Palpable  Popliteal Palpable Palpable  PT Palpable Palpable  DP Palpable Palpable  Gastrointestinal: Non-distended. No guarding/no peritoneal signs.  Musculoskeletal: M/S 5/5 throughout.  No deformity or atrophy.  Neurologic: CN 2-12 intact. Symmetrical.  Speech is fluent. Motor exam as listed above. Psychiatric: Judgment intact, Mood & affect appropriate for pt's clinical situation. Dermatologic: No rashes or ulcers noted.  No changes consistent with cellulitis. Lymph : No lichenification or skin changes of chronic lymphedema.  CBC Lab Results  Component Value Date   WBC 20.8 (H) 04/15/2020   HGB 14.3 04/15/2020   HCT 41.2 04/15/2020   MCV 84.1 04/15/2020   PLT 320 04/15/2020    BMET  Component Value Date/Time   NA 133 (L) 04/15/2020 0827   NA 138 03/11/2016 1527   NA 137 09/27/2014 1747   K 3.6 04/15/2020 0827   K 3.7 09/27/2014 1747   CL 99 04/15/2020 0827   CL 99 (L) 09/27/2014 1747   CO2 22 04/15/2020 0827   CO2 29 09/27/2014 1747   GLUCOSE 144 (H) 04/15/2020 0827   GLUCOSE 96 09/27/2014 1747   BUN 6 04/15/2020 0827   BUN 7 03/11/2016 1527   BUN 8 09/27/2014 1747   CREATININE 0.76 04/15/2020 0827   CREATININE 0.82 09/27/2014 1747   CALCIUM 10.6 (H) 04/15/2020 0827   CALCIUM 9.2 09/27/2014 1747   GFRNONAA >60 04/15/2020 0827   GFRNONAA >60 09/27/2014 1747   GFRAA >60 02/02/2020 0341   GFRAA >60 09/27/2014 1747   CrCl cannot be calculated (Patient's most recent lab result is older than the maximum 21 days allowed.).  COAG Lab Results  Component Value Date   INR 0.98 06/20/2016    Radiology No results found.  Outside Studies/Documentation *** pages of outside documents were reviewed.  They showed ***.  Assessment/Plan There are no diagnoses linked to this encounter.   [name], MD  05/28/2020 12:53 PM

## 2020-06-06 DEATH — deceased

## 2020-06-20 ENCOUNTER — Other Ambulatory Visit: Payer: Medicaid Other | Attending: Gastroenterology

## 2020-06-22 ENCOUNTER — Ambulatory Visit: Admission: RE | Admit: 2020-06-22 | Payer: Medicaid Other | Source: Home / Self Care

## 2020-06-22 ENCOUNTER — Encounter: Admission: RE | Payer: Self-pay | Source: Home / Self Care

## 2020-06-22 SURGERY — COLONOSCOPY
Anesthesia: General

## 2020-07-30 NOTE — Telephone Encounter (Signed)
Unable to reach patient by phone. Unable to [facility].

## 2020-10-04 ENCOUNTER — Encounter: Payer: Self-pay | Admitting: Urology
